# Patient Record
Sex: Female | Born: 1935 | Race: White | Hispanic: No | Smoking: Former smoker
Health system: Southern US, Community
[De-identification: ages and names within clinical notes are randomized; demographics above are authoritative.]

## PROBLEM LIST (undated history)

## (undated) DIAGNOSIS — M1990 Unspecified osteoarthritis, unspecified site: Secondary | ICD-10-CM

## (undated) DIAGNOSIS — T4145XA Adverse effect of unspecified anesthetic, initial encounter: Secondary | ICD-10-CM

## (undated) DIAGNOSIS — G40909 Epilepsy, unspecified, not intractable, without status epilepticus: Secondary | ICD-10-CM

## (undated) DIAGNOSIS — G4730 Sleep apnea, unspecified: Secondary | ICD-10-CM

## (undated) DIAGNOSIS — I639 Cerebral infarction, unspecified: Secondary | ICD-10-CM

## (undated) DIAGNOSIS — T8859XA Other complications of anesthesia, initial encounter: Secondary | ICD-10-CM

## (undated) DIAGNOSIS — R519 Headache, unspecified: Secondary | ICD-10-CM

## (undated) DIAGNOSIS — J189 Pneumonia, unspecified organism: Secondary | ICD-10-CM

## (undated) DIAGNOSIS — K219 Gastro-esophageal reflux disease without esophagitis: Secondary | ICD-10-CM

## (undated) DIAGNOSIS — R1310 Dysphagia, unspecified: Secondary | ICD-10-CM

## (undated) DIAGNOSIS — M6281 Muscle weakness (generalized): Secondary | ICD-10-CM

## (undated) DIAGNOSIS — N184 Chronic kidney disease, stage 4 (severe): Secondary | ICD-10-CM

## (undated) DIAGNOSIS — R51 Headache: Secondary | ICD-10-CM

## (undated) DIAGNOSIS — J9610 Chronic respiratory failure, unspecified whether with hypoxia or hypercapnia: Secondary | ICD-10-CM

## (undated) DIAGNOSIS — M549 Dorsalgia, unspecified: Secondary | ICD-10-CM

## (undated) DIAGNOSIS — D649 Anemia, unspecified: Secondary | ICD-10-CM

## (undated) DIAGNOSIS — I509 Heart failure, unspecified: Secondary | ICD-10-CM

## (undated) DIAGNOSIS — I10 Essential (primary) hypertension: Secondary | ICD-10-CM

## (undated) HISTORY — PX: DILATION AND CURETTAGE OF UTERUS: SHX78

## (undated) HISTORY — PX: JOINT REPLACEMENT: SHX530

## (undated) HISTORY — PX: BREAST SURGERY: SHX581

## (undated) HISTORY — PX: BACK SURGERY: SHX140

## (undated) HISTORY — PX: CAROTID ENDARTERECTOMY: SUR193

## (undated) HISTORY — PX: APPENDECTOMY: SHX54

## (undated) HISTORY — PX: KNEE SURGERY: SHX244

## (undated) HISTORY — PX: TONSILLECTOMY: SUR1361

---

## 2004-07-14 ENCOUNTER — Ambulatory Visit: Payer: Self-pay | Admitting: Internal Medicine

## 2004-10-23 ENCOUNTER — Inpatient Hospital Stay: Payer: Self-pay | Admitting: Anesthesiology

## 2004-12-16 ENCOUNTER — Encounter: Payer: Self-pay | Admitting: Internal Medicine

## 2005-01-01 ENCOUNTER — Encounter: Payer: Self-pay | Admitting: Internal Medicine

## 2005-01-31 ENCOUNTER — Encounter: Payer: Self-pay | Admitting: Internal Medicine

## 2006-12-03 ENCOUNTER — Emergency Department: Payer: Self-pay | Admitting: Emergency Medicine

## 2008-03-02 ENCOUNTER — Other Ambulatory Visit: Payer: Self-pay

## 2008-03-02 ENCOUNTER — Emergency Department: Payer: Self-pay | Admitting: Emergency Medicine

## 2009-05-28 ENCOUNTER — Ambulatory Visit: Payer: Self-pay | Admitting: Pain Medicine

## 2009-06-05 ENCOUNTER — Other Ambulatory Visit: Payer: Self-pay | Admitting: Pain Medicine

## 2009-06-19 ENCOUNTER — Ambulatory Visit: Payer: Self-pay | Admitting: Pain Medicine

## 2009-07-23 ENCOUNTER — Ambulatory Visit: Payer: Self-pay | Admitting: Pain Medicine

## 2009-07-31 ENCOUNTER — Ambulatory Visit: Payer: Self-pay | Admitting: Internal Medicine

## 2009-08-07 ENCOUNTER — Ambulatory Visit: Payer: Self-pay | Admitting: Pain Medicine

## 2009-09-16 ENCOUNTER — Ambulatory Visit: Payer: Self-pay | Admitting: Pain Medicine

## 2009-10-22 ENCOUNTER — Ambulatory Visit: Payer: Self-pay | Admitting: Pain Medicine

## 2009-10-30 ENCOUNTER — Ambulatory Visit: Payer: Self-pay | Admitting: Pain Medicine

## 2009-11-26 ENCOUNTER — Ambulatory Visit: Payer: Self-pay | Admitting: Pain Medicine

## 2009-12-04 ENCOUNTER — Ambulatory Visit: Payer: Self-pay | Admitting: Pain Medicine

## 2009-12-31 ENCOUNTER — Ambulatory Visit: Payer: Self-pay | Admitting: Pain Medicine

## 2009-12-31 ENCOUNTER — Ambulatory Visit: Payer: Self-pay | Admitting: Internal Medicine

## 2010-01-08 ENCOUNTER — Ambulatory Visit: Payer: Self-pay | Admitting: Pain Medicine

## 2010-01-28 ENCOUNTER — Ambulatory Visit: Payer: Self-pay | Admitting: Pain Medicine

## 2010-02-12 ENCOUNTER — Encounter: Payer: Self-pay | Admitting: Pain Medicine

## 2010-02-25 ENCOUNTER — Ambulatory Visit: Payer: Self-pay | Admitting: Pain Medicine

## 2010-03-03 ENCOUNTER — Encounter: Payer: Self-pay | Admitting: Pain Medicine

## 2010-03-05 ENCOUNTER — Ambulatory Visit: Payer: Self-pay | Admitting: Pain Medicine

## 2010-04-03 ENCOUNTER — Encounter: Payer: Self-pay | Admitting: Pain Medicine

## 2010-11-30 ENCOUNTER — Inpatient Hospital Stay: Payer: Self-pay | Admitting: Surgery

## 2010-12-02 LAB — PATHOLOGY REPORT

## 2011-02-25 ENCOUNTER — Inpatient Hospital Stay: Payer: Self-pay | Admitting: Internal Medicine

## 2011-03-05 ENCOUNTER — Emergency Department: Payer: Self-pay | Admitting: *Deleted

## 2012-07-17 ENCOUNTER — Emergency Department: Payer: Self-pay | Admitting: Emergency Medicine

## 2012-07-17 LAB — URINALYSIS, COMPLETE
Bacteria: NONE SEEN
Bilirubin,UR: NEGATIVE
Blood: NEGATIVE
Glucose,UR: 50 mg/dL (ref 0–75)
Hyaline Cast: 1
Ketone: NEGATIVE
Ph: 6 (ref 4.5–8.0)
Squamous Epithelial: <1
WBC UR: 12 /HPF (ref 0–5)

## 2012-07-17 LAB — CBC
HCT: 34.8 % — ABNORMAL LOW (ref 35.0–47.0)
HGB: 11.6 g/dL — ABNORMAL LOW (ref 12.0–16.0)
MCH: 32.2 pg (ref 26.0–34.0)
MCHC: 33.3 g/dL (ref 32.0–36.0)
MCV: 97 fL (ref 80–100)

## 2012-07-17 LAB — COMPREHENSIVE METABOLIC PANEL
Albumin: 3.5 g/dL (ref 3.4–5.0)
Alkaline Phosphatase: 71 U/L (ref 50–136)
Anion Gap: 11 (ref 7–16)
Calcium, Total: 11.6 mg/dL — ABNORMAL HIGH (ref 8.5–10.1)
Chloride: 100 mmol/L (ref 98–107)
EGFR (African American): 30 — ABNORMAL LOW
Glucose: 186 mg/dL — ABNORMAL HIGH (ref 65–99)
Potassium: 4.3 mmol/L (ref 3.5–5.1)
SGOT(AST): 31 U/L (ref 15–37)
SGPT (ALT): 30 U/L (ref 12–78)
Total Protein: 7.7 g/dL (ref 6.4–8.2)

## 2012-07-17 LAB — CK TOTAL AND CKMB (NOT AT ARMC)
CK, Total: 49 U/L (ref 21–215)
CK-MB: <0.5 ng/mL — ABNORMAL LOW (ref 0.5–3.6)

## 2012-07-18 LAB — CBC WITH DIFFERENTIAL/PLATELET
Basophil #: 0.1 10*3/uL (ref 0.0–0.1)
Basophil %: 0.5 %
Eosinophil #: 0.6 10*3/uL (ref 0.0–0.7)
HCT: 35.7 % (ref 35.0–47.0)
Lymphocyte #: 3.8 10*3/uL — ABNORMAL HIGH (ref 1.0–3.6)
MCH: 31.1 pg (ref 26.0–34.0)
MCHC: 32.8 g/dL (ref 32.0–36.0)
Monocyte #: 1.2 x10 3/mm — ABNORMAL HIGH (ref 0.2–0.9)
Monocyte %: 9.7 %
Neutrophil #: 7.1 10*3/uL — ABNORMAL HIGH (ref 1.4–6.5)
Platelet: 267 10*3/uL (ref 150–440)
RBC: 3.77 10*6/uL — ABNORMAL LOW (ref 3.80–5.20)
RDW: 11.9 % (ref 11.5–14.5)
WBC: 12.8 10*3/uL — ABNORMAL HIGH (ref 3.6–11.0)

## 2012-07-19 ENCOUNTER — Inpatient Hospital Stay: Payer: Self-pay | Admitting: Internal Medicine

## 2012-07-19 LAB — BASIC METABOLIC PANEL
BUN: 31 mg/dL — ABNORMAL HIGH (ref 7–18)
Calcium, Total: 11.3 mg/dL — ABNORMAL HIGH (ref 8.5–10.1)
Creatinine: 1.78 mg/dL — ABNORMAL HIGH (ref 0.60–1.30)
EGFR (African American): 29 — ABNORMAL LOW
EGFR (Non-African Amer.): 25 — ABNORMAL LOW
Glucose: 130 mg/dL — ABNORMAL HIGH (ref 65–99)
Osmolality: 269 (ref 275–301)
Sodium: 130 mmol/L — ABNORMAL LOW (ref 136–145)

## 2012-07-19 LAB — URINALYSIS, COMPLETE
Nitrite: NEGATIVE
Ph: 6 (ref 4.5–8.0)
Protein: 30
RBC,UR: <1 /HPF (ref 0–5)
WBC UR: 4 /HPF (ref 0–5)

## 2012-07-20 LAB — CBC WITH DIFFERENTIAL/PLATELET
Basophil #: 0.1 10*3/uL (ref 0.0–0.1)
Basophil %: 0.7 %
HCT: 35.3 % (ref 35.0–47.0)
HGB: 12.2 g/dL (ref 12.0–16.0)
Lymphocyte #: 3 10*3/uL (ref 1.0–3.6)
Lymphocyte %: 31.7 %
MCHC: 34.6 g/dL (ref 32.0–36.0)
MCV: 95 fL (ref 80–100)
Monocyte #: 0.9 x10 3/mm (ref 0.2–0.9)
Monocyte %: 9.5 %
Neutrophil #: 4.9 10*3/uL (ref 1.4–6.5)
RDW: 11.6 % (ref 11.5–14.5)
WBC: 9.5 10*3/uL (ref 3.6–11.0)

## 2012-07-20 LAB — COMPREHENSIVE METABOLIC PANEL
Alkaline Phosphatase: 75 U/L (ref 50–136)
Anion Gap: 8 (ref 7–16)
Bilirubin,Total: 0.3 mg/dL (ref 0.2–1.0)
Calcium, Total: 11 mg/dL — ABNORMAL HIGH (ref 8.5–10.1)
Chloride: 102 mmol/L (ref 98–107)
Co2: 24 mmol/L (ref 21–32)
Creatinine: 1.54 mg/dL — ABNORMAL HIGH (ref 0.60–1.30)
EGFR (Non-African Amer.): 30 — ABNORMAL LOW
Osmolality: 274 (ref 275–301)
Potassium: 4 mmol/L (ref 3.5–5.1)
SGPT (ALT): 27 U/L (ref 12–78)
Sodium: 134 mmol/L — ABNORMAL LOW (ref 136–145)

## 2012-07-21 LAB — BASIC METABOLIC PANEL
Calcium, Total: 10.4 mg/dL — ABNORMAL HIGH (ref 8.5–10.1)
Co2: 22 mmol/L (ref 21–32)
Creatinine: 1.47 mg/dL — ABNORMAL HIGH (ref 0.60–1.30)
EGFR (African American): 37 — ABNORMAL LOW
EGFR (Non-African Amer.): 32 — ABNORMAL LOW
Glucose: 125 mg/dL — ABNORMAL HIGH (ref 65–99)
Sodium: 135 mmol/L — ABNORMAL LOW (ref 136–145)

## 2012-07-22 LAB — BASIC METABOLIC PANEL
Anion Gap: 7 (ref 7–16)
Calcium, Total: 10.2 mg/dL — ABNORMAL HIGH (ref 8.5–10.1)
Chloride: 103 mmol/L (ref 98–107)
Co2: 24 mmol/L (ref 21–32)
EGFR (African American): 35 — ABNORMAL LOW
Osmolality: 274 (ref 275–301)

## 2013-05-01 ENCOUNTER — Ambulatory Visit: Payer: Self-pay | Admitting: Internal Medicine

## 2013-05-01 LAB — IRON AND TIBC
Iron Saturation: 11 %
Iron: 39 ug/dL — ABNORMAL LOW (ref 50–170)
Unbound Iron-Bind.Cap.: 327 ug/dL

## 2013-05-01 LAB — RETICULOCYTES
Absolute Retic Count: 0.0422 10*6/uL (ref 0.019–0.186)
Reticulocyte: 1.8 % (ref 0.4–3.1)

## 2013-05-01 LAB — CBC CANCER CENTER
Eosinophil: 8 %
MCHC: 33.8 g/dL (ref 32.0–36.0)
MCV: 94 fL (ref 80–100)
Segmented Neutrophils: 58 %

## 2013-05-03 ENCOUNTER — Ambulatory Visit: Payer: Self-pay | Admitting: Internal Medicine

## 2013-05-05 LAB — CBC CANCER CENTER
Basophil #: 0.1 x10 3/mm (ref 0.0–0.1)
Basophil %: 1 %
Eosinophil %: 8.7 %
HCT: 23.6 % — ABNORMAL LOW (ref 35.0–47.0)
Lymphocyte #: 2.8 x10 3/mm (ref 1.0–3.6)
MCHC: 34.3 g/dL (ref 32.0–36.0)
MCV: 94 fL (ref 80–100)
Monocyte #: 0.8 x10 3/mm (ref 0.2–0.9)
Monocyte %: 9.3 %
Neutrophil #: 3.8 x10 3/mm (ref 1.4–6.5)
Platelet: 223 x10 3/mm (ref 150–440)
RBC: 2.53 10*6/uL — ABNORMAL LOW (ref 3.80–5.20)
RDW: 12.8 % (ref 11.5–14.5)
WBC: 8.2 x10 3/mm (ref 3.6–11.0)

## 2013-05-05 LAB — RETICULOCYTES: Absolute Retic Count: 0.0333 10*6/uL (ref 0.019–0.186)

## 2013-05-27 ENCOUNTER — Emergency Department: Payer: Self-pay | Admitting: Emergency Medicine

## 2013-06-03 ENCOUNTER — Ambulatory Visit: Payer: Self-pay | Admitting: Internal Medicine

## 2013-06-07 LAB — CANCER CENTER HEMOGLOBIN: HGB: 9.7 g/dL — ABNORMAL LOW (ref 12.0–16.0)

## 2013-07-03 ENCOUNTER — Ambulatory Visit: Payer: Self-pay | Admitting: Internal Medicine

## 2013-07-12 LAB — CBC CANCER CENTER
Basophil #: 0.1 x10 3/mm (ref 0.0–0.1)
Basophil %: 1 %
Eosinophil #: 0.6 x10 3/mm (ref 0.0–0.7)
Eosinophil %: 7.3 %
HCT: 26.2 % — ABNORMAL LOW (ref 35.0–47.0)
Lymphocyte #: 2.2 x10 3/mm (ref 1.0–3.6)
Lymphocyte %: 28.2 %
MCV: 98 fL (ref 80–100)
Monocyte #: 0.9 x10 3/mm (ref 0.2–0.9)
Monocyte %: 10.7 %
Neutrophil %: 52.8 %
Platelet: 250 x10 3/mm (ref 150–440)
RBC: 2.69 10*6/uL — ABNORMAL LOW (ref 3.80–5.20)
RDW: 15.1 % — ABNORMAL HIGH (ref 11.5–14.5)

## 2013-07-12 LAB — IRON AND TIBC
Iron Bind.Cap.(Total): 273 ug/dL (ref 250–450)
Iron Saturation: 41 %
Iron: 111 ug/dL (ref 50–170)
Unbound Iron-Bind.Cap.: 162 ug/dL

## 2013-07-12 LAB — FERRITIN: Ferritin (ARMC): 35 ng/mL (ref 8–388)

## 2013-07-14 LAB — CBC WITH DIFFERENTIAL/PLATELET
Basophil #: 0.1 10*3/uL (ref 0.0–0.1)
Basophil %: 1 %
Eosinophil #: 0.9 10*3/uL — ABNORMAL HIGH (ref 0.0–0.7)
Eosinophil %: 8.7 %
HCT: 26.9 % — ABNORMAL LOW (ref 35.0–47.0)
HGB: 9 g/dL — ABNORMAL LOW (ref 12.0–16.0)
Lymphocyte %: 34.3 %
MCH: 32.7 pg (ref 26.0–34.0)
MCV: 97 fL (ref 80–100)
Neutrophil #: 4.7 10*3/uL (ref 1.4–6.5)
Neutrophil %: 44.9 %
Platelet: 263 10*3/uL (ref 150–440)
RDW: 15 % — ABNORMAL HIGH (ref 11.5–14.5)
WBC: 10.5 10*3/uL (ref 3.6–11.0)

## 2013-07-14 LAB — CK TOTAL AND CKMB (NOT AT ARMC)
CK, Total: 38 U/L (ref 21–215)
CK-MB: <0.5 ng/mL — ABNORMAL LOW (ref 0.5–3.6)

## 2013-07-14 LAB — URINALYSIS, COMPLETE
Blood: NEGATIVE
Hyaline Cast: 3
Ketone: NEGATIVE
Leukocyte Esterase: NEGATIVE
Nitrite: NEGATIVE
Ph: 6 (ref 4.5–8.0)
Protein: 30
RBC,UR: 1 /HPF (ref 0–5)
Specific Gravity: 1.009 (ref 1.003–1.030)
Squamous Epithelial: <1

## 2013-07-14 LAB — COMPREHENSIVE METABOLIC PANEL
Alkaline Phosphatase: 57 U/L
BUN: 28 mg/dL — ABNORMAL HIGH (ref 7–18)
Bilirubin,Total: 0.2 mg/dL (ref 0.2–1.0)
Co2: 25 mmol/L (ref 21–32)
Creatinine: 2.27 mg/dL — ABNORMAL HIGH (ref 0.60–1.30)
Glucose: 142 mg/dL — ABNORMAL HIGH (ref 65–99)
Osmolality: 280 (ref 275–301)
Potassium: 4.3 mmol/L (ref 3.5–5.1)
SGOT(AST): 21 U/L (ref 15–37)
SGPT (ALT): 16 U/L (ref 12–78)

## 2013-07-14 LAB — PROTIME-INR
INR: 0.9
Prothrombin Time: 12.5 secs (ref 11.5–14.7)

## 2013-07-15 DIAGNOSIS — I6350 Cerebral infarction due to unspecified occlusion or stenosis of unspecified cerebral artery: Secondary | ICD-10-CM

## 2013-07-15 LAB — CK TOTAL AND CKMB (NOT AT ARMC)
CK, Total: 40 U/L (ref 21–215)
CK, Total: 30 U/L (ref 21–215)
CK-MB: <0.5 ng/mL — ABNORMAL LOW (ref 0.5–3.6)

## 2013-07-15 LAB — TROPONIN I
Troponin-I: <0.02 ng/mL
Troponin-I: <0.02 ng/mL

## 2013-07-16 ENCOUNTER — Inpatient Hospital Stay: Payer: Self-pay | Admitting: Internal Medicine

## 2013-07-16 LAB — BASIC METABOLIC PANEL
BUN: 23 mg/dL — ABNORMAL HIGH (ref 7–18)
Calcium, Total: 10.8 mg/dL — ABNORMAL HIGH (ref 8.5–10.1)
Chloride: 107 mmol/L (ref 98–107)
Co2: 26 mmol/L (ref 21–32)
EGFR (African American): 22 — ABNORMAL LOW
EGFR (Non-African Amer.): 19 — ABNORMAL LOW
Potassium: 4 mmol/L (ref 3.5–5.1)

## 2013-07-16 LAB — CBC WITH DIFFERENTIAL/PLATELET
Basophil #: 0.1 10*3/uL (ref 0.0–0.1)
Eosinophil #: 0.6 10*3/uL (ref 0.0–0.7)
HCT: 26.2 % — ABNORMAL LOW (ref 35.0–47.0)
Lymphocyte #: 1.8 10*3/uL (ref 1.0–3.6)
Lymphocyte %: 20.8 %
MCHC: 33.5 g/dL (ref 32.0–36.0)
Monocyte #: 0.9 x10 3/mm (ref 0.2–0.9)
Monocyte %: 10.1 %
Neutrophil %: 60.9 %
Platelet: 267 10*3/uL (ref 150–440)
RBC: 2.69 10*6/uL — ABNORMAL LOW (ref 3.80–5.20)
RDW: 15 % — ABNORMAL HIGH (ref 11.5–14.5)
WBC: 8.5 10*3/uL (ref 3.6–11.0)

## 2013-07-16 LAB — LIPID PANEL
HDL Cholesterol: 42 mg/dL (ref 40–60)
Ldl Cholesterol, Calc: 112 mg/dL — ABNORMAL HIGH (ref 0–100)
Triglycerides: 259 mg/dL — ABNORMAL HIGH (ref 0–200)

## 2013-07-16 LAB — HEMOGLOBIN A1C: Hemoglobin A1C: 5.3 % (ref 4.2–6.3)

## 2013-07-17 LAB — BASIC METABOLIC PANEL
Anion Gap: 6 — ABNORMAL LOW (ref 7–16)
Chloride: 106 mmol/L (ref 98–107)
Co2: 25 mmol/L (ref 21–32)
Creatinine: 2.15 mg/dL — ABNORMAL HIGH (ref 0.60–1.30)
EGFR (African American): 23 — ABNORMAL LOW
EGFR (Non-African Amer.): 20 — ABNORMAL LOW
Glucose: 159 mg/dL — ABNORMAL HIGH (ref 65–99)
Osmolality: 281 (ref 275–301)
Potassium: 4 mmol/L (ref 3.5–5.1)
Sodium: 137 mmol/L (ref 136–145)

## 2013-07-19 LAB — BASIC METABOLIC PANEL
Anion Gap: 6 — ABNORMAL LOW (ref 7–16)
BUN: 26 mg/dL — ABNORMAL HIGH (ref 7–18)
Calcium, Total: 11.1 mg/dL — ABNORMAL HIGH (ref 8.5–10.1)
Chloride: 104 mmol/L (ref 98–107)
Co2: 28 mmol/L (ref 21–32)
Creatinine: 2.18 mg/dL — ABNORMAL HIGH (ref 0.60–1.30)
Glucose: 110 mg/dL — ABNORMAL HIGH (ref 65–99)
Osmolality: 281 (ref 275–301)
Potassium: 4 mmol/L (ref 3.5–5.1)

## 2013-07-20 LAB — CULTURE, BLOOD (SINGLE)

## 2013-08-03 ENCOUNTER — Ambulatory Visit: Payer: Self-pay | Admitting: Internal Medicine

## 2013-08-14 ENCOUNTER — Inpatient Hospital Stay: Payer: Self-pay | Admitting: Internal Medicine

## 2013-08-14 LAB — CBC
HCT: 23.6 % — AB (ref 35.0–47.0)
HGB: 8.1 g/dL — AB (ref 12.0–16.0)
MCH: 32.9 pg (ref 26.0–34.0)
MCHC: 34.2 g/dL (ref 32.0–36.0)
MCV: 96 fL (ref 80–100)
PLATELETS: 298 10*3/uL (ref 150–440)
RBC: 2.46 10*6/uL — AB (ref 3.80–5.20)
RDW: 14 % (ref 11.5–14.5)
WBC: 9.2 10*3/uL (ref 3.6–11.0)

## 2013-08-14 LAB — BASIC METABOLIC PANEL
Anion Gap: 8 (ref 7–16)
BUN: 39 mg/dL — ABNORMAL HIGH (ref 7–18)
CREATININE: 2.31 mg/dL — AB (ref 0.60–1.30)
Calcium, Total: 10.4 mg/dL — ABNORMAL HIGH (ref 8.5–10.1)
Chloride: 100 mmol/L (ref 98–107)
Co2: 24 mmol/L (ref 21–32)
EGFR (African American): 21 — ABNORMAL LOW
EGFR (Non-African Amer.): 18 — ABNORMAL LOW
GLUCOSE: 120 mg/dL — AB (ref 65–99)
OSMOLALITY: 275 (ref 275–301)
Potassium: 4.4 mmol/L (ref 3.5–5.1)
Sodium: 132 mmol/L — ABNORMAL LOW (ref 136–145)

## 2013-08-14 LAB — PRO B NATRIURETIC PEPTIDE: B-Type Natriuretic Peptide: 474 pg/mL — ABNORMAL HIGH (ref 0–450)

## 2013-08-14 LAB — URINALYSIS, COMPLETE
Bacteria: NONE SEEN
Bilirubin,UR: NEGATIVE
Blood: NEGATIVE
Glucose,UR: NEGATIVE mg/dL (ref 0–75)
Ketone: NEGATIVE
Leukocyte Esterase: NEGATIVE
Nitrite: NEGATIVE
Ph: 5 (ref 4.5–8.0)
SPECIFIC GRAVITY: 1.009 (ref 1.003–1.030)
SQUAMOUS EPITHELIAL: NONE SEEN
WBC UR: <1 /HPF (ref 0–5)

## 2013-08-14 LAB — TROPONIN I: Troponin-I: <0.02 ng/mL

## 2013-08-14 LAB — PHENYTOIN LEVEL, TOTAL: Dilantin: 6.4 ug/mL — ABNORMAL LOW (ref 10.0–20.0)

## 2013-08-15 LAB — CBC WITH DIFFERENTIAL/PLATELET
Basophil #: 0.1 10*3/uL (ref 0.0–0.1)
Basophil %: 0.7 %
EOS PCT: 6.4 %
Eosinophil #: 0.7 10*3/uL (ref 0.0–0.7)
HCT: 25.2 % — AB (ref 35.0–47.0)
HGB: 8.6 g/dL — ABNORMAL LOW (ref 12.0–16.0)
Lymphocyte #: 2.3 10*3/uL (ref 1.0–3.6)
Lymphocyte %: 21 %
MCH: 33 pg (ref 26.0–34.0)
MCHC: 34 g/dL (ref 32.0–36.0)
MCV: 97 fL (ref 80–100)
Monocyte #: 1.1 x10 3/mm — ABNORMAL HIGH (ref 0.2–0.9)
Monocyte %: 9.7 %
Neutrophil #: 6.8 10*3/uL — ABNORMAL HIGH (ref 1.4–6.5)
Neutrophil %: 62.2 %
Platelet: 328 10*3/uL (ref 150–440)
RBC: 2.6 10*6/uL — ABNORMAL LOW (ref 3.80–5.20)
RDW: 13.6 % (ref 11.5–14.5)
WBC: 11 10*3/uL (ref 3.6–11.0)

## 2013-08-15 LAB — BASIC METABOLIC PANEL
ANION GAP: 6 — AB (ref 7–16)
BUN: 35 mg/dL — ABNORMAL HIGH (ref 7–18)
CALCIUM: 10.7 mg/dL — AB (ref 8.5–10.1)
Chloride: 101 mmol/L (ref 98–107)
Co2: 25 mmol/L (ref 21–32)
Creatinine: 2.19 mg/dL — ABNORMAL HIGH (ref 0.60–1.30)
EGFR (Non-African Amer.): 19 — ABNORMAL LOW
GFR CALC AF AMER: 23 — AB
Glucose: 98 mg/dL (ref 65–99)
Osmolality: 272 (ref 275–301)
POTASSIUM: 4.2 mmol/L (ref 3.5–5.1)
SODIUM: 132 mmol/L — AB (ref 136–145)

## 2013-08-15 LAB — TSH: THYROID STIMULATING HORM: 2.28 u[IU]/mL

## 2013-08-16 LAB — BASIC METABOLIC PANEL
ANION GAP: 3 — AB (ref 7–16)
BUN: 34 mg/dL — AB (ref 7–18)
CALCIUM: 10.3 mg/dL — AB (ref 8.5–10.1)
CHLORIDE: 99 mmol/L (ref 98–107)
CO2: 28 mmol/L (ref 21–32)
CREATININE: 2.11 mg/dL — AB (ref 0.60–1.30)
EGFR (African American): 24 — ABNORMAL LOW
EGFR (Non-African Amer.): 20 — ABNORMAL LOW
Glucose: 124 mg/dL — ABNORMAL HIGH (ref 65–99)
Osmolality: 270 (ref 275–301)
POTASSIUM: 4.3 mmol/L (ref 3.5–5.1)
SODIUM: 130 mmol/L — AB (ref 136–145)

## 2013-08-23 ENCOUNTER — Ambulatory Visit: Payer: Self-pay | Admitting: Internal Medicine

## 2013-08-23 LAB — CBC CANCER CENTER
Basophil #: 0.1 x10 3/mm (ref 0.0–0.1)
Basophil %: 0.6 %
EOS PCT: 2 %
Eosinophil #: 0.2 x10 3/mm (ref 0.0–0.7)
HCT: 24.4 % — ABNORMAL LOW (ref 35.0–47.0)
HGB: 8.1 g/dL — AB (ref 12.0–16.0)
LYMPHS ABS: 2.1 x10 3/mm (ref 1.0–3.6)
Lymphocyte %: 19.4 %
MCH: 33.1 pg (ref 26.0–34.0)
MCHC: 33.2 g/dL (ref 32.0–36.0)
MCV: 100 fL (ref 80–100)
MONOS PCT: 8.4 %
Monocyte #: 0.9 x10 3/mm (ref 0.2–0.9)
Neutrophil #: 7.6 x10 3/mm — ABNORMAL HIGH (ref 1.4–6.5)
Neutrophil %: 69.6 %
PLATELETS: 328 x10 3/mm (ref 150–440)
RBC: 2.45 10*6/uL — ABNORMAL LOW (ref 3.80–5.20)
RDW: 14.5 % (ref 11.5–14.5)
WBC: 11 x10 3/mm (ref 3.6–11.0)

## 2013-08-23 LAB — IRON AND TIBC
Iron Bind.Cap.(Total): 286 ug/dL (ref 250–450)
Iron Saturation: 42 %
Iron: 119 ug/dL (ref 50–170)
Unbound Iron-Bind.Cap.: 167 ug/dL

## 2013-08-23 LAB — FERRITIN: FERRITIN (ARMC): 30 ng/mL (ref 8–388)

## 2013-08-30 LAB — CANCER CENTER HEMOGLOBIN: HGB: 7.4 g/dL — ABNORMAL LOW (ref 12.0–16.0)

## 2013-09-03 ENCOUNTER — Ambulatory Visit: Payer: Self-pay | Admitting: Internal Medicine

## 2013-09-07 ENCOUNTER — Ambulatory Visit: Payer: Self-pay | Admitting: Vascular Surgery

## 2013-09-07 LAB — URINALYSIS, COMPLETE
BILIRUBIN, UR: NEGATIVE
BLOOD: NEGATIVE
Glucose,UR: NEGATIVE mg/dL (ref 0–75)
Ketone: NEGATIVE
Nitrite: NEGATIVE
PH: 5 (ref 4.5–8.0)
Protein: NEGATIVE
RBC,UR: 1 /HPF (ref 0–5)
SPECIFIC GRAVITY: 1.009 (ref 1.003–1.030)

## 2013-09-07 LAB — BASIC METABOLIC PANEL
Anion Gap: 6 — ABNORMAL LOW (ref 7–16)
BUN: 42 mg/dL — ABNORMAL HIGH (ref 7–18)
CHLORIDE: 104 mmol/L (ref 98–107)
Calcium, Total: 10.9 mg/dL — ABNORMAL HIGH (ref 8.5–10.1)
Co2: 23 mmol/L (ref 21–32)
Creatinine: 2.42 mg/dL — ABNORMAL HIGH (ref 0.60–1.30)
EGFR (African American): 20 — ABNORMAL LOW
EGFR (Non-African Amer.): 17 — ABNORMAL LOW
GLUCOSE: 116 mg/dL — AB (ref 65–99)
OSMOLALITY: 278 (ref 275–301)
Potassium: 5.5 mmol/L — ABNORMAL HIGH (ref 3.5–5.1)
Sodium: 133 mmol/L — ABNORMAL LOW (ref 136–145)

## 2013-09-07 LAB — CBC
HCT: 22.1 % — AB (ref 35.0–47.0)
HGB: 7.5 g/dL — ABNORMAL LOW (ref 12.0–16.0)
MCH: 34 pg (ref 26.0–34.0)
MCHC: 34.1 g/dL (ref 32.0–36.0)
MCV: 100 fL (ref 80–100)
Platelet: 219 10*3/uL (ref 150–440)
RBC: 2.21 10*6/uL — AB (ref 3.80–5.20)
RDW: 13.8 % (ref 11.5–14.5)
WBC: 9.2 10*3/uL (ref 3.6–11.0)

## 2013-09-15 ENCOUNTER — Inpatient Hospital Stay: Payer: Self-pay | Admitting: Vascular Surgery

## 2013-09-15 LAB — HEMATOCRIT: HCT: 28 % — ABNORMAL LOW (ref 35.0–47.0)

## 2013-09-15 LAB — HEMOGLOBIN: HGB: 9.8 g/dL — ABNORMAL LOW (ref 12.0–16.0)

## 2013-09-15 LAB — POTASSIUM: POTASSIUM: 3.6 mmol/L (ref 3.5–5.1)

## 2013-09-16 LAB — BASIC METABOLIC PANEL
Anion Gap: 6 — ABNORMAL LOW (ref 7–16)
BUN: 26 mg/dL — ABNORMAL HIGH (ref 7–18)
CO2: 21 mmol/L (ref 21–32)
Calcium, Total: 9.8 mg/dL (ref 8.5–10.1)
Chloride: 111 mmol/L — ABNORMAL HIGH (ref 98–107)
Creatinine: 1.89 mg/dL — ABNORMAL HIGH (ref 0.60–1.30)
EGFR (African American): 27 — ABNORMAL LOW
GFR CALC NON AF AMER: 23 — AB
GLUCOSE: 102 mg/dL — AB (ref 65–99)
OSMOLALITY: 281 (ref 275–301)
Potassium: 3.8 mmol/L (ref 3.5–5.1)
SODIUM: 138 mmol/L (ref 136–145)

## 2013-09-16 LAB — CBC WITH DIFFERENTIAL/PLATELET
BASOS PCT: 1 %
Basophil #: 0.1 10*3/uL (ref 0.0–0.1)
EOS PCT: 0 %
Eosinophil #: 0 10*3/uL (ref 0.0–0.7)
HCT: 27 % — AB (ref 35.0–47.0)
HGB: 8.9 g/dL — AB (ref 12.0–16.0)
LYMPHS ABS: 1.5 10*3/uL (ref 1.0–3.6)
Lymphocyte %: 14 %
MCH: 32.1 pg (ref 26.0–34.0)
MCHC: 33 g/dL (ref 32.0–36.0)
MCV: 97 fL (ref 80–100)
Monocyte #: 1.4 x10 3/mm — ABNORMAL HIGH (ref 0.2–0.9)
Monocyte %: 12.7 %
Neutrophil #: 7.8 10*3/uL — ABNORMAL HIGH (ref 1.4–6.5)
Neutrophil %: 72.3 %
Platelet: 292 10*3/uL (ref 150–440)
RBC: 2.78 10*6/uL — ABNORMAL LOW (ref 3.80–5.20)
RDW: 16.2 % — ABNORMAL HIGH (ref 11.5–14.5)
WBC: 10.8 10*3/uL (ref 3.6–11.0)

## 2013-09-16 LAB — PROTIME-INR
INR: 1.1
PROTHROMBIN TIME: 14.1 s (ref 11.5–14.7)

## 2013-09-16 LAB — APTT: Activated PTT: 24.6 secs (ref 23.6–35.9)

## 2013-09-16 LAB — PHENYTOIN LEVEL, TOTAL: Dilantin: 5.7 ug/mL — ABNORMAL LOW (ref 10.0–20.0)

## 2013-09-18 LAB — CBC WITH DIFFERENTIAL/PLATELET
BASOS ABS: 0.1 10*3/uL (ref 0.0–0.1)
Basophil %: 0.7 %
EOS ABS: 0.3 10*3/uL (ref 0.0–0.7)
Eosinophil %: 3.3 %
HCT: 24.1 % — ABNORMAL LOW (ref 35.0–47.0)
HGB: 8.3 g/dL — ABNORMAL LOW (ref 12.0–16.0)
Lymphocyte #: 1.4 10*3/uL (ref 1.0–3.6)
Lymphocyte %: 15.6 %
MCH: 33.7 pg (ref 26.0–34.0)
MCHC: 34.5 g/dL (ref 32.0–36.0)
MCV: 98 fL (ref 80–100)
Monocyte #: 1.1 x10 3/mm — ABNORMAL HIGH (ref 0.2–0.9)
Monocyte %: 12.3 %
Neutrophil #: 6.3 10*3/uL (ref 1.4–6.5)
Neutrophil %: 68.1 %
PLATELETS: 256 10*3/uL (ref 150–440)
RBC: 2.46 10*6/uL — ABNORMAL LOW (ref 3.80–5.20)
RDW: 15.6 % — ABNORMAL HIGH (ref 11.5–14.5)
WBC: 9.2 10*3/uL (ref 3.6–11.0)

## 2013-09-18 LAB — BASIC METABOLIC PANEL
ANION GAP: 5 — AB (ref 7–16)
BUN: 25 mg/dL — ABNORMAL HIGH (ref 7–18)
CALCIUM: 9.6 mg/dL (ref 8.5–10.1)
Chloride: 112 mmol/L — ABNORMAL HIGH (ref 98–107)
Co2: 25 mmol/L (ref 21–32)
Creatinine: 1.6 mg/dL — ABNORMAL HIGH (ref 0.60–1.30)
GFR CALC AF AMER: 33 — AB
GFR CALC NON AF AMER: 28 — AB
GLUCOSE: 96 mg/dL (ref 65–99)
OSMOLALITY: 287 (ref 275–301)
Potassium: 3.5 mmol/L (ref 3.5–5.1)
Sodium: 142 mmol/L (ref 136–145)

## 2013-09-18 LAB — PATHOLOGY REPORT

## 2013-09-21 ENCOUNTER — Ambulatory Visit: Payer: Self-pay | Admitting: Internal Medicine

## 2013-10-04 ENCOUNTER — Emergency Department: Payer: Self-pay

## 2013-10-04 ENCOUNTER — Ambulatory Visit: Payer: Self-pay | Admitting: Internal Medicine

## 2013-10-04 LAB — CBC
HCT: 29.3 % — AB (ref 35.0–47.0)
HGB: 9.6 g/dL — ABNORMAL LOW (ref 12.0–16.0)
MCH: 31.9 pg (ref 26.0–34.0)
MCHC: 32.8 g/dL (ref 32.0–36.0)
MCV: 97 fL (ref 80–100)
PLATELETS: 277 10*3/uL (ref 150–440)
RBC: 3.02 10*6/uL — AB (ref 3.80–5.20)
RDW: 14.9 % — AB (ref 11.5–14.5)
WBC: 7.4 10*3/uL (ref 3.6–11.0)

## 2013-10-04 LAB — BASIC METABOLIC PANEL
Anion Gap: 6 — ABNORMAL LOW (ref 7–16)
BUN: 24 mg/dL — AB (ref 7–18)
CHLORIDE: 103 mmol/L (ref 98–107)
CREATININE: 2.13 mg/dL — AB (ref 0.60–1.30)
Calcium, Total: 10.6 mg/dL — ABNORMAL HIGH (ref 8.5–10.1)
Co2: 25 mmol/L (ref 21–32)
EGFR (African American): 23 — ABNORMAL LOW
EGFR (Non-African Amer.): 20 — ABNORMAL LOW
GLUCOSE: 141 mg/dL — AB (ref 65–99)
OSMOLALITY: 275 (ref 275–301)
POTASSIUM: 4.2 mmol/L (ref 3.5–5.1)
SODIUM: 134 mmol/L — AB (ref 136–145)

## 2013-10-05 ENCOUNTER — Ambulatory Visit: Payer: Self-pay | Admitting: Internal Medicine

## 2013-10-29 ENCOUNTER — Emergency Department: Payer: Self-pay | Admitting: Emergency Medicine

## 2013-10-29 LAB — CBC
HCT: 27.6 % — AB (ref 35.0–47.0)
HGB: 9.2 g/dL — ABNORMAL LOW (ref 12.0–16.0)
MCH: 34.1 pg — ABNORMAL HIGH (ref 26.0–34.0)
MCHC: 33.3 g/dL (ref 32.0–36.0)
MCV: 102 fL — ABNORMAL HIGH (ref 80–100)
Platelet: 291 10*3/uL (ref 150–440)
RBC: 2.7 10*6/uL — ABNORMAL LOW (ref 3.80–5.20)
RDW: 15.7 % — ABNORMAL HIGH (ref 11.5–14.5)
WBC: 7.7 10*3/uL (ref 3.6–11.0)

## 2013-10-29 LAB — TROPONIN I: Troponin-I: <0.02 ng/mL

## 2013-10-29 LAB — COMPREHENSIVE METABOLIC PANEL
ALBUMIN: 3.1 g/dL — AB (ref 3.4–5.0)
Alkaline Phosphatase: 54 U/L
Anion Gap: 7 (ref 7–16)
BILIRUBIN TOTAL: 0.1 mg/dL — AB (ref 0.2–1.0)
BUN: 36 mg/dL — ABNORMAL HIGH (ref 7–18)
CALCIUM: 10 mg/dL (ref 8.5–10.1)
CHLORIDE: 95 mmol/L — AB (ref 98–107)
CO2: 29 mmol/L (ref 21–32)
CREATININE: 2.27 mg/dL — AB (ref 0.60–1.30)
EGFR (African American): 22 — ABNORMAL LOW
EGFR (Non-African Amer.): 19 — ABNORMAL LOW
Glucose: 143 mg/dL — ABNORMAL HIGH (ref 65–99)
OSMOLALITY: 273 (ref 275–301)
POTASSIUM: 4.5 mmol/L (ref 3.5–5.1)
SGOT(AST): 22 U/L (ref 15–37)
SGPT (ALT): 16 U/L (ref 12–78)
Sodium: 131 mmol/L — ABNORMAL LOW (ref 136–145)
Total Protein: 6.9 g/dL (ref 6.4–8.2)

## 2013-10-29 LAB — LIPASE, BLOOD: Lipase: 161 U/L (ref 73–393)

## 2013-11-17 ENCOUNTER — Ambulatory Visit: Payer: Self-pay | Admitting: Internal Medicine

## 2013-11-20 ENCOUNTER — Ambulatory Visit: Payer: Self-pay | Admitting: Internal Medicine

## 2013-11-20 LAB — CANCER CENTER HEMOGLOBIN: HGB: 10.7 g/dL — AB (ref 12.0–16.0)

## 2013-11-23 LAB — COMPREHENSIVE METABOLIC PANEL
ALBUMIN: 3.3 g/dL — AB (ref 3.4–5.0)
Alkaline Phosphatase: 71 U/L
Anion Gap: 6 — ABNORMAL LOW (ref 7–16)
BUN: 31 mg/dL — ABNORMAL HIGH (ref 7–18)
Bilirubin,Total: 0.2 mg/dL (ref 0.2–1.0)
CALCIUM: 11.9 mg/dL — AB (ref 8.5–10.1)
CO2: 27 mmol/L (ref 21–32)
Chloride: 101 mmol/L (ref 98–107)
Creatinine: 2.31 mg/dL — ABNORMAL HIGH (ref 0.60–1.30)
EGFR (Non-African Amer.): 18 — ABNORMAL LOW
GFR CALC AF AMER: 21 — AB
Glucose: 108 mg/dL — ABNORMAL HIGH (ref 65–99)
Osmolality: 275 (ref 275–301)
Potassium: 4.8 mmol/L (ref 3.5–5.1)
SGOT(AST): 18 U/L (ref 15–37)
SGPT (ALT): 13 U/L (ref 12–78)
SODIUM: 134 mmol/L — AB (ref 136–145)
TOTAL PROTEIN: 7.4 g/dL (ref 6.4–8.2)

## 2013-11-23 LAB — TSH: Thyroid Stimulating Horm: 1.56 u[IU]/mL

## 2013-11-23 LAB — URINALYSIS, COMPLETE
BILIRUBIN, UR: NEGATIVE
Blood: NEGATIVE
GLUCOSE, UR: NEGATIVE mg/dL (ref 0–75)
Ketone: NEGATIVE
Leukocyte Esterase: NEGATIVE
Nitrite: NEGATIVE
Ph: 5 (ref 4.5–8.0)
Protein: 30
RBC,UR: NONE SEEN /HPF (ref 0–5)
SPECIFIC GRAVITY: 1.011 (ref 1.003–1.030)
Squamous Epithelial: NONE SEEN
WBC UR: 1 /HPF (ref 0–5)

## 2013-11-23 LAB — CBC
HCT: 34.9 % — ABNORMAL LOW (ref 35.0–47.0)
HGB: 11.6 g/dL — ABNORMAL LOW (ref 12.0–16.0)
MCH: 34.2 pg — ABNORMAL HIGH (ref 26.0–34.0)
MCHC: 33.3 g/dL (ref 32.0–36.0)
MCV: 103 fL — ABNORMAL HIGH (ref 80–100)
Platelet: 310 10*3/uL (ref 150–440)
RBC: 3.4 10*6/uL — ABNORMAL LOW (ref 3.80–5.20)
RDW: 13.4 % (ref 11.5–14.5)
WBC: 12.7 10*3/uL — ABNORMAL HIGH (ref 3.6–11.0)

## 2013-11-23 LAB — TROPONIN I: Troponin-I: <0.02 ng/mL

## 2013-11-24 ENCOUNTER — Inpatient Hospital Stay: Payer: Self-pay | Admitting: Student

## 2013-11-24 LAB — CBC WITH DIFFERENTIAL/PLATELET
Basophil #: 0 10*3/uL (ref 0.0–0.1)
Basophil %: 0.2 %
EOS ABS: 0 10*3/uL (ref 0.0–0.7)
Eosinophil %: 0 %
HCT: 29.9 % — ABNORMAL LOW (ref 35.0–47.0)
HGB: 10.1 g/dL — ABNORMAL LOW (ref 12.0–16.0)
LYMPHS PCT: 16.4 %
Lymphocyte #: 1.4 10*3/uL (ref 1.0–3.6)
MCH: 34.3 pg — ABNORMAL HIGH (ref 26.0–34.0)
MCHC: 33.7 g/dL (ref 32.0–36.0)
MCV: 102 fL — ABNORMAL HIGH (ref 80–100)
MONOS PCT: 6.4 %
Monocyte #: 0.5 x10 3/mm (ref 0.2–0.9)
NEUTROS PCT: 77 %
Neutrophil #: 6.7 10*3/uL — ABNORMAL HIGH (ref 1.4–6.5)
Platelet: 279 10*3/uL (ref 150–440)
RBC: 2.93 10*6/uL — ABNORMAL LOW (ref 3.80–5.20)
RDW: 13.2 % (ref 11.5–14.5)
WBC: 8.6 10*3/uL (ref 3.6–11.0)

## 2013-11-24 LAB — BASIC METABOLIC PANEL
ANION GAP: 6 — AB (ref 7–16)
BUN: 32 mg/dL — ABNORMAL HIGH (ref 7–18)
CHLORIDE: 104 mmol/L (ref 98–107)
CREATININE: 2.06 mg/dL — AB (ref 0.60–1.30)
Calcium, Total: 10.8 mg/dL — ABNORMAL HIGH (ref 8.5–10.1)
Co2: 27 mmol/L (ref 21–32)
EGFR (Non-African Amer.): 21 — ABNORMAL LOW
GFR CALC AF AMER: 24 — AB
Glucose: 166 mg/dL — ABNORMAL HIGH (ref 65–99)
Osmolality: 284 (ref 275–301)
POTASSIUM: 3.9 mmol/L (ref 3.5–5.1)
SODIUM: 137 mmol/L (ref 136–145)

## 2013-11-24 LAB — PHENYTOIN LEVEL, TOTAL: Dilantin: 5.2 ug/mL — ABNORMAL LOW (ref 10.0–20.0)

## 2013-11-25 LAB — BASIC METABOLIC PANEL
Anion Gap: 5 — ABNORMAL LOW (ref 7–16)
BUN: 34 mg/dL — AB (ref 7–18)
CALCIUM: 10.6 mg/dL — AB (ref 8.5–10.1)
CHLORIDE: 104 mmol/L (ref 98–107)
CO2: 27 mmol/L (ref 21–32)
CREATININE: 1.99 mg/dL — AB (ref 0.60–1.30)
EGFR (Non-African Amer.): 22 — ABNORMAL LOW
GFR CALC AF AMER: 25 — AB
Glucose: 140 mg/dL — ABNORMAL HIGH (ref 65–99)
OSMOLALITY: 282 (ref 275–301)
POTASSIUM: 4.2 mmol/L (ref 3.5–5.1)
SODIUM: 136 mmol/L (ref 136–145)

## 2013-11-25 LAB — PHENYTOIN LEVEL, TOTAL: DILANTIN: 8.9 ug/mL — AB (ref 10.0–20.0)

## 2013-11-26 LAB — RENAL FUNCTION PANEL
ANION GAP: 6 — AB (ref 7–16)
Albumin: 2.5 g/dL — ABNORMAL LOW (ref 3.4–5.0)
BUN: 38 mg/dL — ABNORMAL HIGH (ref 7–18)
CO2: 27 mmol/L (ref 21–32)
CREATININE: 1.71 mg/dL — AB (ref 0.60–1.30)
Calcium, Total: 10.2 mg/dL — ABNORMAL HIGH (ref 8.5–10.1)
Chloride: 104 mmol/L (ref 98–107)
EGFR (Non-African Amer.): 26 — ABNORMAL LOW
GFR CALC AF AMER: 30 — AB
Glucose: 127 mg/dL — ABNORMAL HIGH (ref 65–99)
Osmolality: 284 (ref 275–301)
PHOSPHORUS: 2.6 mg/dL (ref 2.5–4.9)
Potassium: 4.2 mmol/L (ref 3.5–5.1)
Sodium: 137 mmol/L (ref 136–145)

## 2013-11-26 LAB — IRON AND TIBC
IRON BIND. CAP.(TOTAL): 188 ug/dL — AB (ref 250–450)
Iron Saturation: 27 %
Iron: 50 ug/dL (ref 50–170)
Unbound Iron-Bind.Cap.: 138 ug/dL

## 2013-11-26 LAB — TSH: Thyroid Stimulating Horm: 0.864 u[IU]/mL

## 2013-11-26 LAB — FOLATE: FOLIC ACID: 5.7 ng/mL (ref 3.1–100.0)

## 2013-11-26 LAB — FERRITIN: Ferritin (ARMC): 31 ng/mL (ref 8–388)

## 2013-11-27 LAB — BASIC METABOLIC PANEL
ANION GAP: 8 (ref 7–16)
BUN: 44 mg/dL — AB (ref 7–18)
CALCIUM: 10.2 mg/dL — AB (ref 8.5–10.1)
Chloride: 101 mmol/L (ref 98–107)
Co2: 26 mmol/L (ref 21–32)
Creatinine: 1.74 mg/dL — ABNORMAL HIGH (ref 0.60–1.30)
EGFR (Non-African Amer.): 26 — ABNORMAL LOW
GFR CALC AF AMER: 30 — AB
Glucose: 175 mg/dL — ABNORMAL HIGH (ref 65–99)
Osmolality: 286 (ref 275–301)
POTASSIUM: 5.4 mmol/L — AB (ref 3.5–5.1)
Sodium: 135 mmol/L — ABNORMAL LOW (ref 136–145)

## 2013-11-27 LAB — POTASSIUM: POTASSIUM: 5.1 mmol/L (ref 3.5–5.1)

## 2013-11-28 ENCOUNTER — Encounter: Payer: Self-pay | Admitting: Internal Medicine

## 2013-11-28 LAB — BASIC METABOLIC PANEL
Anion Gap: 5 — ABNORMAL LOW (ref 7–16)
BUN: 39 mg/dL — ABNORMAL HIGH (ref 7–18)
CO2: 30 mmol/L (ref 21–32)
CREATININE: 1.87 mg/dL — AB (ref 0.60–1.30)
Calcium, Total: 10 mg/dL (ref 8.5–10.1)
Chloride: 102 mmol/L (ref 98–107)
EGFR (African American): 27 — ABNORMAL LOW
EGFR (Non-African Amer.): 23 — ABNORMAL LOW
Glucose: 72 mg/dL (ref 65–99)
Osmolality: 282 (ref 275–301)
Potassium: 4.2 mmol/L (ref 3.5–5.1)
SODIUM: 137 mmol/L (ref 136–145)

## 2013-11-28 LAB — UR PROT ELECTROPHORESIS, URINE RANDOM

## 2013-11-29 LAB — PROTEIN ELECTROPHORESIS(ARMC)

## 2013-11-29 LAB — KAPPA/LAMBDA FREE LIGHT CHAINS (ARMC)

## 2013-12-01 ENCOUNTER — Encounter: Payer: Self-pay | Admitting: Internal Medicine

## 2013-12-02 ENCOUNTER — Emergency Department: Payer: Self-pay | Admitting: Emergency Medicine

## 2013-12-02 LAB — URINALYSIS, COMPLETE
BLOOD: NEGATIVE
Bacteria: NONE SEEN
Bilirubin,UR: NEGATIVE
Glucose,UR: NEGATIVE mg/dL (ref 0–75)
Hyaline Cast: 5
KETONE: NEGATIVE
NITRITE: NEGATIVE
Ph: 5 (ref 4.5–8.0)
Protein: 100
RBC, UR: NONE SEEN /HPF (ref 0–5)
Specific Gravity: 1.014 (ref 1.003–1.030)
Squamous Epithelial: NONE SEEN

## 2013-12-02 LAB — CBC
HCT: 26.5 % — AB (ref 35.0–47.0)
HGB: 8.8 g/dL — ABNORMAL LOW (ref 12.0–16.0)
MCH: 33.7 pg (ref 26.0–34.0)
MCHC: 33.2 g/dL (ref 32.0–36.0)
MCV: 102 fL — AB (ref 80–100)
PLATELETS: 248 10*3/uL (ref 150–440)
RBC: 2.61 10*6/uL — AB (ref 3.80–5.20)
RDW: 12.7 % (ref 11.5–14.5)
WBC: 12 10*3/uL — ABNORMAL HIGH (ref 3.6–11.0)

## 2013-12-02 LAB — BASIC METABOLIC PANEL
Anion Gap: 5 — ABNORMAL LOW (ref 7–16)
BUN: 46 mg/dL — AB (ref 7–18)
CHLORIDE: 103 mmol/L (ref 98–107)
CO2: 25 mmol/L (ref 21–32)
Calcium, Total: 9.5 mg/dL (ref 8.5–10.1)
Creatinine: 2.06 mg/dL — ABNORMAL HIGH (ref 0.60–1.30)
GFR CALC AF AMER: 24 — AB
GFR CALC NON AF AMER: 21 — AB
Glucose: 143 mg/dL — ABNORMAL HIGH (ref 65–99)
Osmolality: 281 (ref 275–301)
Potassium: 4.5 mmol/L (ref 3.5–5.1)
Sodium: 133 mmol/L — ABNORMAL LOW (ref 136–145)

## 2013-12-02 LAB — TROPONIN I

## 2013-12-08 ENCOUNTER — Observation Stay: Payer: Self-pay | Admitting: Internal Medicine

## 2013-12-08 LAB — CBC WITH DIFFERENTIAL/PLATELET
BASOS ABS: 0.1 10*3/uL (ref 0.0–0.1)
Basophil %: 0.8 %
Eosinophil #: 0.9 10*3/uL — ABNORMAL HIGH (ref 0.0–0.7)
Eosinophil %: 9.4 %
HCT: 29.1 % — AB (ref 35.0–47.0)
HGB: 9.8 g/dL — AB (ref 12.0–16.0)
LYMPHS PCT: 25.2 %
Lymphocyte #: 2.5 10*3/uL (ref 1.0–3.6)
MCH: 33.6 pg (ref 26.0–34.0)
MCHC: 33.6 g/dL (ref 32.0–36.0)
MCV: 100 fL (ref 80–100)
Monocyte #: 1 x10 3/mm — ABNORMAL HIGH (ref 0.2–0.9)
Monocyte %: 10 %
NEUTROS ABS: 5.4 10*3/uL (ref 1.4–6.5)
NEUTROS PCT: 54.6 %
Platelet: 274 10*3/uL (ref 150–440)
RBC: 2.92 10*6/uL — ABNORMAL LOW (ref 3.80–5.20)
RDW: 12.3 % (ref 11.5–14.5)
WBC: 9.9 10*3/uL (ref 3.6–11.0)

## 2013-12-08 LAB — COMPREHENSIVE METABOLIC PANEL
ALT: 20 U/L (ref 12–78)
AST: 29 U/L (ref 15–37)
Albumin: 2.7 g/dL — ABNORMAL LOW (ref 3.4–5.0)
Alkaline Phosphatase: 61 U/L
Anion Gap: 4 — ABNORMAL LOW (ref 7–16)
BUN: 46 mg/dL — ABNORMAL HIGH (ref 7–18)
Bilirubin,Total: 0.2 mg/dL (ref 0.2–1.0)
Calcium, Total: 10.5 mg/dL — ABNORMAL HIGH (ref 8.5–10.1)
Chloride: 104 mmol/L (ref 98–107)
Co2: 26 mmol/L (ref 21–32)
Creatinine: 2.62 mg/dL — ABNORMAL HIGH (ref 0.60–1.30)
EGFR (African American): 18 — ABNORMAL LOW
GFR CALC NON AF AMER: 16 — AB
Glucose: 99 mg/dL (ref 65–99)
OSMOLALITY: 280 (ref 275–301)
POTASSIUM: 5.2 mmol/L — AB (ref 3.5–5.1)
Sodium: 134 mmol/L — ABNORMAL LOW (ref 136–145)
TOTAL PROTEIN: 6.8 g/dL (ref 6.4–8.2)

## 2013-12-09 LAB — BASIC METABOLIC PANEL
ANION GAP: 8 (ref 7–16)
BUN: 48 mg/dL — ABNORMAL HIGH (ref 7–18)
CALCIUM: 10.2 mg/dL — AB (ref 8.5–10.1)
CREATININE: 2.4 mg/dL — AB (ref 0.60–1.30)
Chloride: 104 mmol/L (ref 98–107)
Co2: 24 mmol/L (ref 21–32)
EGFR (Non-African Amer.): 17 — ABNORMAL LOW
GFR CALC AF AMER: 20 — AB
Glucose: 176 mg/dL — ABNORMAL HIGH (ref 65–99)
OSMOLALITY: 289 (ref 275–301)
POTASSIUM: 4.7 mmol/L (ref 3.5–5.1)
Sodium: 136 mmol/L (ref 136–145)

## 2013-12-10 ENCOUNTER — Ambulatory Visit: Payer: Self-pay | Admitting: Internal Medicine

## 2013-12-22 LAB — URINALYSIS, COMPLETE
BILIRUBIN, UR: NEGATIVE
BLOOD: NEGATIVE
Bacteria: NONE SEEN
GLUCOSE, UR: NEGATIVE mg/dL (ref 0–75)
Ketone: NEGATIVE
NITRITE: NEGATIVE
Ph: 5 (ref 4.5–8.0)
SPECIFIC GRAVITY: 1.01 (ref 1.003–1.030)
Squamous Epithelial: <1
WBC UR: 15 /HPF (ref 0–5)

## 2013-12-24 LAB — URINE CULTURE

## 2014-01-09 ENCOUNTER — Ambulatory Visit: Payer: Self-pay | Admitting: Internal Medicine

## 2014-01-09 LAB — CBC CANCER CENTER
BASOS PCT: 1 %
Basophil #: 0.1 x10 3/mm (ref 0.0–0.1)
EOS ABS: 0.2 x10 3/mm (ref 0.0–0.7)
EOS PCT: 3.7 %
HCT: 26.1 % — AB (ref 35.0–47.0)
HGB: 8.6 g/dL — AB (ref 12.0–16.0)
LYMPHS ABS: 2 x10 3/mm (ref 1.0–3.6)
LYMPHS PCT: 30.9 %
MCH: 33.5 pg (ref 26.0–34.0)
MCHC: 33.1 g/dL (ref 32.0–36.0)
MCV: 101 fL — AB (ref 80–100)
MONO ABS: 0.8 x10 3/mm (ref 0.2–0.9)
MONOS PCT: 11.8 %
Neutrophil #: 3.4 x10 3/mm (ref 1.4–6.5)
Neutrophil %: 52.6 %
PLATELETS: 254 x10 3/mm (ref 150–440)
RBC: 2.58 10*6/uL — ABNORMAL LOW (ref 3.80–5.20)
RDW: 12.9 % (ref 11.5–14.5)
WBC: 6.4 x10 3/mm (ref 3.6–11.0)

## 2014-01-09 LAB — IRON AND TIBC
IRON BIND. CAP.(TOTAL): 244 ug/dL — AB (ref 250–450)
IRON: 113 ug/dL (ref 50–170)
Iron Saturation: 46 %
Unbound Iron-Bind.Cap.: 131 ug/dL

## 2014-01-09 LAB — FERRITIN: FERRITIN (ARMC): 30 ng/mL (ref 8–388)

## 2014-01-31 ENCOUNTER — Ambulatory Visit: Payer: Self-pay | Admitting: Internal Medicine

## 2014-02-07 ENCOUNTER — Encounter: Payer: Self-pay | Admitting: Vascular Surgery

## 2014-02-26 ENCOUNTER — Emergency Department: Payer: Self-pay | Admitting: Emergency Medicine

## 2014-02-26 LAB — CBC WITH DIFFERENTIAL/PLATELET
Basophil #: 0.1 10*3/uL (ref 0.0–0.1)
Basophil %: 0.6 %
EOS ABS: 0.1 10*3/uL (ref 0.0–0.7)
EOS PCT: 1 %
HCT: 25.7 % — AB (ref 35.0–47.0)
HGB: 8.4 g/dL — ABNORMAL LOW (ref 12.0–16.0)
Lymphocyte #: 1.6 10*3/uL (ref 1.0–3.6)
Lymphocyte %: 16.8 %
MCH: 34.3 pg — AB (ref 26.0–34.0)
MCHC: 32.8 g/dL (ref 32.0–36.0)
MCV: 105 fL — ABNORMAL HIGH (ref 80–100)
MONOS PCT: 11.7 %
Monocyte #: 1.1 x10 3/mm — ABNORMAL HIGH (ref 0.2–0.9)
NEUTROS ABS: 6.6 10*3/uL — AB (ref 1.4–6.5)
NEUTROS PCT: 69.9 %
Platelet: 205 10*3/uL (ref 150–440)
RBC: 2.46 10*6/uL — ABNORMAL LOW (ref 3.80–5.20)
RDW: 13.1 % (ref 11.5–14.5)
WBC: 9.5 10*3/uL (ref 3.6–11.0)

## 2014-02-26 LAB — COMPREHENSIVE METABOLIC PANEL
ALBUMIN: 2.9 g/dL — AB (ref 3.4–5.0)
ALK PHOS: 42 U/L — AB
ANION GAP: 9 (ref 7–16)
BILIRUBIN TOTAL: 0.2 mg/dL (ref 0.2–1.0)
BUN: 36 mg/dL — ABNORMAL HIGH (ref 7–18)
CHLORIDE: 106 mmol/L (ref 98–107)
Calcium, Total: 10.1 mg/dL (ref 8.5–10.1)
Co2: 24 mmol/L (ref 21–32)
Creatinine: 2.09 mg/dL — ABNORMAL HIGH (ref 0.60–1.30)
GFR CALC AF AMER: 24 — AB
GFR CALC NON AF AMER: 20 — AB
GLUCOSE: 104 mg/dL — AB (ref 65–99)
Osmolality: 286 (ref 275–301)
Potassium: 4.1 mmol/L (ref 3.5–5.1)
SGOT(AST): 15 U/L (ref 15–37)
SGPT (ALT): 18 U/L
Sodium: 139 mmol/L (ref 136–145)
Total Protein: 6.2 g/dL — ABNORMAL LOW (ref 6.4–8.2)

## 2014-02-26 LAB — URINALYSIS, COMPLETE
BILIRUBIN, UR: NEGATIVE
Blood: NEGATIVE
Glucose,UR: 50 mg/dL (ref 0–75)
Ketone: NEGATIVE
LEUKOCYTE ESTERASE: NEGATIVE
Nitrite: NEGATIVE
Ph: 5 (ref 4.5–8.0)
Protein: 100
RBC,UR: NONE SEEN /HPF (ref 0–5)
Specific Gravity: 1.015 (ref 1.003–1.030)

## 2014-03-03 ENCOUNTER — Emergency Department: Payer: Self-pay | Admitting: Emergency Medicine

## 2014-03-03 LAB — COMPREHENSIVE METABOLIC PANEL
Albumin: 2.8 g/dL — ABNORMAL LOW (ref 3.4–5.0)
Alkaline Phosphatase: 37 U/L — ABNORMAL LOW
Anion Gap: 9 (ref 7–16)
BUN: 24 mg/dL — AB (ref 7–18)
Bilirubin,Total: 0.2 mg/dL (ref 0.2–1.0)
CALCIUM: 10.4 mg/dL — AB (ref 8.5–10.1)
CHLORIDE: 101 mmol/L (ref 98–107)
CO2: 26 mmol/L (ref 21–32)
CREATININE: 2.02 mg/dL — AB (ref 0.60–1.30)
EGFR (African American): 25 — ABNORMAL LOW
GFR CALC NON AF AMER: 21 — AB
Glucose: 141 mg/dL — ABNORMAL HIGH (ref 65–99)
Osmolality: 278 (ref 275–301)
Potassium: 3.8 mmol/L (ref 3.5–5.1)
SGOT(AST): 11 U/L — ABNORMAL LOW (ref 15–37)
SGPT (ALT): 14 U/L
Sodium: 136 mmol/L (ref 136–145)
TOTAL PROTEIN: 6.1 g/dL — AB (ref 6.4–8.2)

## 2014-03-03 LAB — CBC
HCT: 26.1 % — ABNORMAL LOW (ref 35.0–47.0)
HGB: 8.7 g/dL — ABNORMAL LOW (ref 12.0–16.0)
MCH: 34.2 pg — AB (ref 26.0–34.0)
MCHC: 33.3 g/dL (ref 32.0–36.0)
MCV: 103 fL — ABNORMAL HIGH (ref 80–100)
Platelet: 221 10*3/uL (ref 150–440)
RBC: 2.53 10*6/uL — ABNORMAL LOW (ref 3.80–5.20)
RDW: 12.9 % (ref 11.5–14.5)
WBC: 5.7 10*3/uL (ref 3.6–11.0)

## 2014-03-03 LAB — CULTURE, BLOOD (SINGLE)

## 2014-03-06 LAB — BASIC METABOLIC PANEL
ANION GAP: 10 (ref 7–16)
BUN: 24 mg/dL — ABNORMAL HIGH (ref 7–18)
CHLORIDE: 99 mmol/L (ref 98–107)
Calcium, Total: 11.4 mg/dL — ABNORMAL HIGH (ref 8.5–10.1)
Co2: 26 mmol/L (ref 21–32)
Creatinine: 2.15 mg/dL — ABNORMAL HIGH (ref 0.60–1.30)
EGFR (African American): 23 — ABNORMAL LOW
EGFR (Non-African Amer.): 20 — ABNORMAL LOW
Glucose: 137 mg/dL — ABNORMAL HIGH (ref 65–99)
Osmolality: 276 (ref 275–301)
Potassium: 3.9 mmol/L (ref 3.5–5.1)
Sodium: 135 mmol/L — ABNORMAL LOW (ref 136–145)

## 2014-03-06 LAB — CBC
HCT: 26.2 % — ABNORMAL LOW (ref 35.0–47.0)
HGB: 9.1 g/dL — ABNORMAL LOW (ref 12.0–16.0)
MCH: 34.9 pg — ABNORMAL HIGH (ref 26.0–34.0)
MCHC: 34.5 g/dL (ref 32.0–36.0)
MCV: 101 fL — AB (ref 80–100)
PLATELETS: 246 10*3/uL (ref 150–440)
RBC: 2.6 10*6/uL — AB (ref 3.80–5.20)
RDW: 12.5 % (ref 11.5–14.5)
WBC: 7.7 10*3/uL (ref 3.6–11.0)

## 2014-03-06 LAB — TROPONIN I: Troponin-I: <0.02 ng/mL

## 2014-03-07 ENCOUNTER — Inpatient Hospital Stay: Payer: Self-pay | Admitting: Internal Medicine

## 2014-03-07 LAB — URINALYSIS, COMPLETE
BILIRUBIN, UR: NEGATIVE
Bacteria: NONE SEEN
Blood: NEGATIVE
GLUCOSE, UR: NEGATIVE mg/dL (ref 0–75)
Ketone: NEGATIVE
Leukocyte Esterase: NEGATIVE
Nitrite: NEGATIVE
PH: 7 (ref 4.5–8.0)
Protein: 100
RBC,UR: NONE SEEN /HPF (ref 0–5)
Specific Gravity: 1.006 (ref 1.003–1.030)
WBC UR: <1 /HPF (ref 0–5)

## 2014-03-07 LAB — BASIC METABOLIC PANEL
Anion Gap: 8 (ref 7–16)
BUN: 25 mg/dL — AB (ref 7–18)
CHLORIDE: 102 mmol/L (ref 98–107)
CO2: 28 mmol/L (ref 21–32)
Calcium, Total: 11.3 mg/dL — ABNORMAL HIGH (ref 8.5–10.1)
Creatinine: 2.08 mg/dL — ABNORMAL HIGH (ref 0.60–1.30)
EGFR (Non-African Amer.): 21 — ABNORMAL LOW
GFR CALC AF AMER: 24 — AB
Glucose: 160 mg/dL — ABNORMAL HIGH (ref 65–99)
Osmolality: 283 (ref 275–301)
POTASSIUM: 3.7 mmol/L (ref 3.5–5.1)
SODIUM: 138 mmol/L (ref 136–145)

## 2014-03-08 LAB — BASIC METABOLIC PANEL
Anion Gap: 7 (ref 7–16)
BUN: 25 mg/dL — ABNORMAL HIGH (ref 7–18)
CO2: 26 mmol/L (ref 21–32)
Calcium, Total: 10.5 mg/dL — ABNORMAL HIGH (ref 8.5–10.1)
Chloride: 105 mmol/L (ref 98–107)
Creatinine: 2.08 mg/dL — ABNORMAL HIGH (ref 0.60–1.30)
GFR CALC AF AMER: 24 — AB
GFR CALC NON AF AMER: 21 — AB
Glucose: 100 mg/dL — ABNORMAL HIGH (ref 65–99)
Osmolality: 280 (ref 275–301)
POTASSIUM: 3.3 mmol/L — AB (ref 3.5–5.1)
Sodium: 138 mmol/L (ref 136–145)

## 2014-03-09 LAB — CBC WITH DIFFERENTIAL/PLATELET
BASOS ABS: 0 10*3/uL (ref 0.0–0.1)
BASOS PCT: 0.6 %
EOS ABS: 0.2 10*3/uL (ref 0.0–0.7)
Eosinophil %: 2.1 %
HCT: 24.4 % — AB (ref 35.0–47.0)
HGB: 8.4 g/dL — ABNORMAL LOW (ref 12.0–16.0)
Lymphocyte #: 1.9 10*3/uL (ref 1.0–3.6)
Lymphocyte %: 24.9 %
MCH: 35 pg — ABNORMAL HIGH (ref 26.0–34.0)
MCHC: 34.5 g/dL (ref 32.0–36.0)
MCV: 102 fL — AB (ref 80–100)
Monocyte #: 0.7 x10 3/mm (ref 0.2–0.9)
Monocyte %: 8.6 %
Neutrophil #: 4.9 10*3/uL (ref 1.4–6.5)
Neutrophil %: 63.8 %
Platelet: 239 10*3/uL (ref 150–440)
RBC: 2.4 10*6/uL — AB (ref 3.80–5.20)
RDW: 12.9 % (ref 11.5–14.5)
WBC: 7.7 10*3/uL (ref 3.6–11.0)

## 2014-03-09 LAB — BASIC METABOLIC PANEL
Anion Gap: 6 — ABNORMAL LOW (ref 7–16)
BUN: 29 mg/dL — ABNORMAL HIGH (ref 7–18)
CALCIUM: 9.9 mg/dL (ref 8.5–10.1)
CO2: 25 mmol/L (ref 21–32)
CREATININE: 1.95 mg/dL — AB (ref 0.60–1.30)
Chloride: 108 mmol/L — ABNORMAL HIGH (ref 98–107)
GFR CALC AF AMER: 26 — AB
GFR CALC NON AF AMER: 22 — AB
Glucose: 86 mg/dL (ref 65–99)
OSMOLALITY: 283 (ref 275–301)
POTASSIUM: 3.7 mmol/L (ref 3.5–5.1)
SODIUM: 139 mmol/L (ref 136–145)

## 2014-03-10 ENCOUNTER — Encounter: Payer: Self-pay | Admitting: Internal Medicine

## 2014-03-10 ENCOUNTER — Encounter: Payer: Self-pay | Admitting: Vascular Surgery

## 2014-04-03 ENCOUNTER — Encounter: Payer: Self-pay | Admitting: Internal Medicine

## 2014-04-06 ENCOUNTER — Ambulatory Visit: Payer: Self-pay | Admitting: Internal Medicine

## 2014-04-06 LAB — CANCER CENTER HEMOGLOBIN: HGB: 9.4 g/dL — AB (ref 12.0–16.0)

## 2014-05-03 ENCOUNTER — Ambulatory Visit: Payer: Self-pay | Admitting: Internal Medicine

## 2014-06-27 ENCOUNTER — Ambulatory Visit: Payer: Self-pay | Admitting: Internal Medicine

## 2014-07-02 ENCOUNTER — Encounter: Payer: Self-pay | Admitting: Surgery

## 2014-07-03 ENCOUNTER — Encounter: Payer: Self-pay | Admitting: Surgery

## 2014-08-03 ENCOUNTER — Encounter: Payer: Self-pay | Admitting: Surgery

## 2014-08-20 ENCOUNTER — Encounter: Payer: Self-pay | Admitting: Surgery

## 2014-09-03 ENCOUNTER — Encounter: Payer: Self-pay | Admitting: Surgery

## 2014-09-20 ENCOUNTER — Encounter: Payer: Self-pay | Admitting: Surgery

## 2014-09-29 ENCOUNTER — Emergency Department: Payer: Self-pay | Admitting: Emergency Medicine

## 2014-10-02 ENCOUNTER — Encounter
Admit: 2014-10-02 | Disposition: A | Discharge status: Home / Self Care | Payer: Self-pay | Attending: Surgery | Admitting: Surgery

## 2014-10-06 ENCOUNTER — Emergency Department: Payer: Self-pay | Admitting: Emergency Medicine

## 2014-10-07 ENCOUNTER — Emergency Department: Payer: Self-pay | Admitting: Emergency Medicine

## 2014-10-12 ENCOUNTER — Ambulatory Visit: Payer: Self-pay | Admitting: Orthopedic Surgery

## 2014-10-14 ENCOUNTER — Emergency Department: Payer: Self-pay | Admitting: Emergency Medicine

## 2014-10-30 ENCOUNTER — Emergency Department: Payer: Self-pay | Admitting: Emergency Medicine

## 2014-10-30 LAB — BASIC METABOLIC PANEL
ANION GAP: 7 (ref 7–16)
BUN: 39 mg/dL — AB
Calcium, Total: 9.8 mg/dL
Chloride: 106 mmol/L
Co2: 26 mmol/L
Creatinine: 2.1 mg/dL — ABNORMAL HIGH
EGFR (Non-African Amer.): 20 — ABNORMAL LOW
GFR CALC AF AMER: 24 — AB
GLUCOSE: 148 mg/dL — AB
Potassium: 4.7 mmol/L
Sodium: 139 mmol/L

## 2014-10-30 LAB — CBC
HCT: 28.9 % — AB (ref 35.0–47.0)
HGB: 9.2 g/dL — AB (ref 12.0–16.0)
MCH: 32.3 pg (ref 26.0–34.0)
MCHC: 32 g/dL (ref 32.0–36.0)
MCV: 101 fL — ABNORMAL HIGH (ref 80–100)
Platelet: 239 10*3/uL (ref 150–440)
RBC: 2.86 10*6/uL — ABNORMAL LOW (ref 3.80–5.20)
RDW: 13 % (ref 11.5–14.5)
WBC: 7.9 10*3/uL (ref 3.6–11.0)

## 2014-10-30 LAB — TROPONIN I: Troponin-I: <0.03 ng/mL

## 2014-11-02 ENCOUNTER — Ambulatory Visit
Admit: 2014-11-02 | Disposition: A | Discharge status: Home / Self Care | Payer: Self-pay | Attending: Internal Medicine | Admitting: Internal Medicine

## 2014-11-02 ENCOUNTER — Encounter
Admit: 2014-11-02 | Disposition: A | Discharge status: Home / Self Care | Payer: Self-pay | Attending: Surgery | Admitting: Surgery

## 2014-11-03 ENCOUNTER — Inpatient Hospital Stay
Admit: 2014-11-03 | Disposition: A | Discharge status: Skilled Nursing Facility | Payer: Self-pay | Attending: Internal Medicine | Admitting: Internal Medicine

## 2014-11-03 LAB — COMPREHENSIVE METABOLIC PANEL
ALBUMIN: 3 g/dL — AB
ALT: 15 U/L
AST: 19 U/L
Alkaline Phosphatase: 53 U/L
Anion Gap: 10 (ref 7–16)
BUN: 39 mg/dL — ABNORMAL HIGH
Bilirubin,Total: 0.5 mg/dL
CALCIUM: 10.2 mg/dL
CHLORIDE: 105 mmol/L
Co2: 23 mmol/L
Creatinine: 2.38 mg/dL — ABNORMAL HIGH
EGFR (Non-African Amer.): 17 — ABNORMAL LOW
GFR CALC AF AMER: 20 — AB
Glucose: 105 mg/dL — ABNORMAL HIGH
Potassium: 4.4 mmol/L
Sodium: 138 mmol/L
Total Protein: 6 g/dL — ABNORMAL LOW

## 2014-11-03 LAB — TROPONIN I

## 2014-11-03 LAB — CBC
HCT: 29.3 % — AB (ref 35.0–47.0)
HGB: 9.5 g/dL — AB (ref 12.0–16.0)
MCH: 32.4 pg (ref 26.0–34.0)
MCHC: 32.3 g/dL (ref 32.0–36.0)
MCV: 100 fL (ref 80–100)
Platelet: 256 10*3/uL (ref 150–440)
RBC: 2.92 10*6/uL — AB (ref 3.80–5.20)
RDW: 13.4 % (ref 11.5–14.5)
WBC: 9.7 10*3/uL (ref 3.6–11.0)

## 2014-11-03 LAB — URINALYSIS, COMPLETE
BACTERIA: NONE SEEN
BLOOD: NEGATIVE
Bilirubin,UR: NEGATIVE
Glucose,UR: 50 mg/dL (ref 0–75)
Hyaline Cast: 10
Leukocyte Esterase: NEGATIVE
Nitrite: NEGATIVE
Ph: 5 (ref 4.5–8.0)
RBC,UR: 1 /HPF (ref 0–5)
Renal Epithelial: 30
SPECIFIC GRAVITY: 1.013 (ref 1.003–1.030)
Squamous Epithelial: NONE SEEN
WBC UR: 2 /HPF (ref 0–5)

## 2014-11-04 LAB — CBC WITH DIFFERENTIAL/PLATELET
BASOS PCT: 0.3 %
Basophil #: 0 10*3/uL (ref 0.0–0.1)
EOS ABS: 0 10*3/uL (ref 0.0–0.7)
Eosinophil %: 0.1 %
HCT: 27.7 % — ABNORMAL LOW (ref 35.0–47.0)
HGB: 9 g/dL — AB (ref 12.0–16.0)
Lymphocyte #: 0.9 10*3/uL — ABNORMAL LOW (ref 1.0–3.6)
Lymphocyte %: 12.8 %
MCH: 32.9 pg (ref 26.0–34.0)
MCHC: 32.3 g/dL (ref 32.0–36.0)
MCV: 102 fL — ABNORMAL HIGH (ref 80–100)
MONO ABS: 0.6 x10 3/mm (ref 0.2–0.9)
Monocyte %: 7.7 %
Neutrophil #: 5.7 10*3/uL (ref 1.4–6.5)
Neutrophil %: 79.1 %
PLATELETS: 249 10*3/uL (ref 150–440)
RBC: 2.72 10*6/uL — ABNORMAL LOW (ref 3.80–5.20)
RDW: 13.7 % (ref 11.5–14.5)
WBC: 7.2 10*3/uL (ref 3.6–11.0)

## 2014-11-04 LAB — URINALYSIS, COMPLETE
BILIRUBIN, UR: NEGATIVE
BLOOD: NEGATIVE
Bacteria: NONE SEEN
Glucose,UR: 50 mg/dL (ref 0–75)
Hyaline Cast: 2
Ketone: NEGATIVE
LEUKOCYTE ESTERASE: NEGATIVE
Nitrite: NEGATIVE
PH: 5 (ref 4.5–8.0)
Protein: >=500
RBC,UR: NONE SEEN /HPF (ref 0–5)
Specific Gravity: 1.014 (ref 1.003–1.030)
Squamous Epithelial: NONE SEEN
WBC UR: 2 /HPF (ref 0–5)

## 2014-11-04 LAB — BASIC METABOLIC PANEL
Anion Gap: 2 — ABNORMAL LOW (ref 7–16)
BUN: 38 mg/dL — ABNORMAL HIGH
CALCIUM: 9.1 mg/dL
CHLORIDE: 106 mmol/L
Co2: 26 mmol/L
Creatinine: 2.33 mg/dL — ABNORMAL HIGH
EGFR (African American): 21 — ABNORMAL LOW
EGFR (Non-African Amer.): 18 — ABNORMAL LOW
Glucose: 88 mg/dL
Potassium: 4.7 mmol/L
Sodium: 134 mmol/L — ABNORMAL LOW

## 2014-11-04 LAB — MAGNESIUM: Magnesium: 2.2 mg/dL

## 2014-11-05 LAB — BASIC METABOLIC PANEL
ANION GAP: 11 (ref 7–16)
BUN: 37 mg/dL — ABNORMAL HIGH
CO2: 21 mmol/L — AB
Calcium, Total: 9.1 mg/dL
Chloride: 103 mmol/L
Creatinine: 2.65 mg/dL — ABNORMAL HIGH
EGFR (Non-African Amer.): 15 — ABNORMAL LOW
GFR CALC AF AMER: 18 — AB
Glucose: 68 mg/dL
Potassium: 4.2 mmol/L
Sodium: 135 mmol/L

## 2014-11-05 LAB — PHENYTOIN LEVEL, TOTAL: Dilantin: 5.8 ug/mL — ABNORMAL LOW

## 2014-11-06 LAB — BASIC METABOLIC PANEL
Anion Gap: 10 (ref 7–16)
BUN: 41 mg/dL — AB
CO2: 20 mmol/L — AB
Calcium, Total: 8.8 mg/dL — ABNORMAL LOW
Chloride: 101 mmol/L
Creatinine: 2.92 mg/dL — ABNORMAL HIGH
EGFR (African American): 16 — ABNORMAL LOW
EGFR (Non-African Amer.): 14 — ABNORMAL LOW
Glucose: 72 mg/dL
Potassium: 4.3 mmol/L
SODIUM: 131 mmol/L — AB

## 2014-11-07 LAB — BASIC METABOLIC PANEL
Anion Gap: 8 (ref 7–16)
BUN: 44 mg/dL — AB
CHLORIDE: 104 mmol/L
CO2: 22 mmol/L
Calcium, Total: 9 mg/dL
Creatinine: 2.9 mg/dL — ABNORMAL HIGH
EGFR (Non-African Amer.): 14 — ABNORMAL LOW
GFR CALC AF AMER: 16 — AB
GLUCOSE: 99 mg/dL
Potassium: 4.1 mmol/L
Sodium: 134 mmol/L — ABNORMAL LOW

## 2014-11-08 ENCOUNTER — Encounter
Admit: 2014-11-08 | Disposition: A | Discharge status: Home / Self Care | Payer: Self-pay | Attending: Internal Medicine | Admitting: Internal Medicine

## 2014-11-08 LAB — CULTURE, BLOOD (SINGLE)

## 2014-11-19 ENCOUNTER — Inpatient Hospital Stay
Admit: 2014-11-19 | Disposition: A | Discharge status: Skilled Nursing Facility | Payer: Self-pay | Attending: Internal Medicine | Admitting: Internal Medicine

## 2014-11-19 DIAGNOSIS — I509 Heart failure, unspecified: Secondary | ICD-10-CM

## 2014-11-19 LAB — PROTIME-INR
INR: 1
PROTHROMBIN TIME: 12.9 s

## 2014-11-19 LAB — COMPREHENSIVE METABOLIC PANEL
ANION GAP: 8 (ref 7–16)
AST: 20 U/L
Albumin: 3 g/dL — ABNORMAL LOW
Alkaline Phosphatase: 46 U/L
BUN: 53 mg/dL — ABNORMAL HIGH
Bilirubin,Total: 0.5 mg/dL
Calcium, Total: 9.6 mg/dL
Chloride: 102 mmol/L
Co2: 22 mmol/L
Creatinine: 2.09 mg/dL — ABNORMAL HIGH
GFR CALC AF AMER: 24 — AB
GFR CALC NON AF AMER: 20 — AB
Glucose: 89 mg/dL
POTASSIUM: 5.3 mmol/L — AB
SGPT (ALT): 15 U/L
SODIUM: 132 mmol/L — AB
Total Protein: 6.2 g/dL — ABNORMAL LOW

## 2014-11-19 LAB — HEMOGLOBIN: HGB: 7.6 g/dL — AB (ref 12.0–16.0)

## 2014-11-19 LAB — CBC
HCT: 25.1 % — ABNORMAL LOW (ref 35.0–47.0)
HGB: 8.1 g/dL — ABNORMAL LOW (ref 12.0–16.0)
MCH: 32.1 pg (ref 26.0–34.0)
MCHC: 32.1 g/dL (ref 32.0–36.0)
MCV: 100 fL (ref 80–100)
PLATELETS: 328 10*3/uL (ref 150–440)
RBC: 2.51 10*6/uL — ABNORMAL LOW (ref 3.80–5.20)
RDW: 13.5 % (ref 11.5–14.5)
WBC: 9.7 10*3/uL (ref 3.6–11.0)

## 2014-11-19 LAB — APTT: ACTIVATED PTT: 26.9 s (ref 23.6–35.9)

## 2014-11-19 LAB — TROPONIN I
Troponin-I: <0.03 ng/mL
Troponin-I: 0.03 ng/mL
Troponin-I: 0.04 ng/mL — ABNORMAL HIGH

## 2014-11-19 LAB — CK TOTAL AND CKMB (NOT AT ARMC)
CK, TOTAL: 23 U/L — AB
CK, TOTAL: 23 U/L — AB
CK, Total: 30 U/L — ABNORMAL LOW
CK-MB: 1.9 ng/mL
CK-MB: 2.3 ng/mL
CK-MB: 2.7 ng/mL

## 2014-11-19 LAB — D-DIMER(ARMC): D-DIMER: 2284 ng/mL

## 2014-11-19 LAB — PRO B NATRIURETIC PEPTIDE: B-Type Natriuretic Peptide: 190 pg/mL — ABNORMAL HIGH

## 2014-11-20 LAB — CBC WITH DIFFERENTIAL/PLATELET
BASOS PCT: 0.5 %
Basophil #: 0 10*3/uL (ref 0.0–0.1)
EOS PCT: 1.1 %
Eosinophil #: 0.1 10*3/uL (ref 0.0–0.7)
HCT: 21.5 % — ABNORMAL LOW (ref 35.0–47.0)
HGB: 7 g/dL — AB (ref 12.0–16.0)
LYMPHS ABS: 1 10*3/uL (ref 1.0–3.6)
Lymphocyte %: 11.4 %
MCH: 32.5 pg (ref 26.0–34.0)
MCHC: 32.6 g/dL (ref 32.0–36.0)
MCV: 100 fL (ref 80–100)
MONO ABS: 0.7 x10 3/mm (ref 0.2–0.9)
MONOS PCT: 7.5 %
Neutrophil #: 7.1 10*3/uL — ABNORMAL HIGH (ref 1.4–6.5)
Neutrophil %: 79.5 %
Platelet: 284 10*3/uL (ref 150–440)
RBC: 2.16 10*6/uL — ABNORMAL LOW (ref 3.80–5.20)
RDW: 13.5 % (ref 11.5–14.5)
WBC: 8.9 10*3/uL (ref 3.6–11.0)

## 2014-11-20 LAB — BASIC METABOLIC PANEL
Anion Gap: 4 — ABNORMAL LOW (ref 7–16)
BUN: 52 mg/dL — AB
CHLORIDE: 104 mmol/L
Calcium, Total: 8.6 mg/dL — ABNORMAL LOW
Co2: 24 mmol/L
Creatinine: 2.07 mg/dL — ABNORMAL HIGH
EGFR (Non-African Amer.): 21 — ABNORMAL LOW
GFR CALC AF AMER: 24 — AB
Glucose: 100 mg/dL — ABNORMAL HIGH
POTASSIUM: 5.2 mmol/L — AB
Sodium: 132 mmol/L — ABNORMAL LOW

## 2014-11-20 LAB — PHENYTOIN LEVEL, TOTAL: DILANTIN: 4.4 ug/mL — AB

## 2014-11-20 LAB — TROPONIN I: Troponin-I: 0.03 ng/mL

## 2014-11-20 NOTE — H&P (Signed)
PATIENT NAME:  Kristina Wilson, Bob R MR#:  [id number] DATE OF BIRTH:  [birth year]  DATE OF ADMISSION:  07/19/2012  REFERRING PHYSICIAN:  Lucrezia EuropeAllison Webster, MD  PRIMARY CARE PHYSICIAN:  Dewaine Oatsenny Tate, MD  CHIEF COMPLAINT: Weakness, cough and uncontrolled blood pressure.   HISTORY OF PRESENT ILLNESS: This is an 81 year old female with significant past medical history of hypertension, seizures, hemorrhagic CVA in the past, degenerative joint disease, chronic back pain, who presents with complaints of weakness and cough. The patient reports she checked her blood pressure at home, which was elevated more than 200. As well, she reports she has been feeling weak. The patient was in the ED two days before where she was diagnosed with pneumonia, discharged on p.o. Levaquin. The patient reports she is still having cough even though nonproductive. As well, the patient was found to have worsening renal failure. She is known to have chronic kidney disease with baseline creatinine 1.5. Today, it was 1.78. As well, she was diagnosed with UTI during her ED visit, and her urinalysis much improved on p.o. Levaquin, but it is still showing trace leukocyte esterase and 4 white blood cells. The hospitalist service was requested to admit the patient as she did not show significant improvement on p.o. Levaquin for her pneumonia, as well as to receive gentle hydration for her worsening renal failure, as well as for management of her uncontrolled blood pressure. The patient denies any chest pain, any coffee-grounds emesis, any abdominal pain, any nausea, vomiting, diarrhea or constipation. She complains of feeling generally weak and reports she is having decreased appetite and p.o. intake.   PAST MEDICAL HISTORY: 1.  Hypertension.  2.  Seizure disorder.  3.  Hemorrhagic CVA.  4.  Remote history of lower extremity DVT, currently not on any anticoagulation.  5.  Degenerative joint disease.  6.  Sleep apnea.  7.  History of  radiculopathy. 8.  Chronic kidney disease.   ALLERGIES:   1.  TRAMADOL  2.  VICODIN. 3.  HYDROCODONE. 4.  CODEINE.  HOME MEDICATIONS: 1.  Levaquin 100 mg oral daily.  2.  Clonidine 0.1 mg p.o. b.i.d.  3.  Enalapril 20 mg oral daily.  4.  Hydrochlorothiazide 25 mg oral daily.  5.  Diltiazem 240 mg daily.  6.  Phenytoin 200 mg at bedtime.  7.  Potassium chloride 10 mEq oral daily.  8.  Coreg 3.125 mg oral at bedtime.  9.  Skelaxin 800 mg oral 3 times a day.  10.  Aspirin 81 mg oral daily.   SOCIAL HISTORY: The patient is a widow, quit smoking more than 40 years ago. No history of alcohol or drug abuse.   FAMILY HISTORY: Significant for coronary artery disease.   REVIEW OF SYSTEMS:  The patient denies any fever or chills, but complains of generalized weakness and fatigue.  EYES: Denies blurry vision, double vision or pain.  ENT: Denies tinnitus, ear pain, hearing loss, epistaxis, but complains of runny nose.  RESPIRATORY: Complains of cough, nonproductive. Denies any wheezing, hemoptysis. Has complaints of mild dyspnea.  CARDIOVASCULAR: Denies chest pain, arrhythmia, palpitations, syncope.  GASTROINTESTINAL: Denies nausea, vomiting, diarrhea, abdominal pain, hematemesis, melena.  GENITOURINARY: Denies dysuria, hematuria, renal colic.  ENDOCRINE:  Denies polyuria or polydipsia, heat or cold intolerance.  HEMATOLOGY: Denies anemia, easy bruising, bleeding diathesis. Has history of previous DVT in the past.  INTEGUMENTARY: Denies acne, rash or skin lesions.  MUSCULOSKELETAL: Denies any gout, cramps. Has history of arthritis, using a walker on ambulation.  NEUROLOGIC: Denies ataxia, dementia, migraine. Has history of hemorrhagic CVA in the past. Has history of seizures as well.  PSYCHIATRIC: Denies anxiety, insomnia, schizophrenia, nervousness.   PHYSICAL EXAMINATION: VITAL SIGNS: Temperature 98.3, pulse 83, respiratory rate 18, blood pressure 188/69, saturating 96% on room air.   GENERAL: Morbidly obese female who looks comfortable, but in no apparent distress.  HEENT: Head atraumatic, normocephalic. Pupils equal, reactive to light. Pink conjunctivae. Anicteric sclerae. Moist oral mucosa.  NECK: Supple. No thyromegaly. No JVD.  CHEST: Good air entry bilaterally. No wheezing, rhonchi. Has rales in the left lung.  CARDIOVASCULAR: S1, S2 heard. No rubs, murmur or gallops.  ABDOMEN: Obese, soft, nontender, nondistended. Bowel sounds present.  EXTREMITIES: No edema. No clubbing. No cyanosis.  PSYCHIATRIC: Appropriate affect, awake, oriented x 3. Intact judgment and insight. Motor 5 out of 5 all extremities.  NEUROLOGIC: Cranial nerves grossly intact.  SKIN: Has chronic lower extremity erythema and dry skin.  LABORATORY DATA:   Glucose 130, BUN 31, creatinine 1.78, sodium 130, potassium 4.1, chloride 96, CO2 of 23. White blood cells 12.8, hemoglobin 11.7, hematocrit 35.7, platelets 267. Urinalysis showing trace leukocyte esterase and 4 white blood cells.   ASSESSMENT AND PLAN: This is an 81 year old female who presents with progressive weakness, cough, mild shortness of breath and uncontrolled hypertension.  1.  Pneumonia. The patient has retrocardiac opacity on chest x-ray lateral film.  We will start on intravenous Rocephin and Zithromax as she did not improve on p.o. Levaquin as an outpatient.  2.  Acute renal failure. The patient's baseline creatinine is 1.4, currently is 1.78. We will hold lisinopril. We will continue with intravenous fluids. We will monitor closely.  3.  Hypertension, uncontrolled. We will resume the patient's home medication. As well, we will add p.o. hydralazine 25 mg p.o. q.i.d. and intravenous hydralazine as needed.  4.  Urinary tract infection. Continue with Rocephin.  5. Hyponatremia, most likely due to volume depletion as the patient has been having poor appetite, decreased p.o. intake. We will continue with fluids.  We will recheck in a.m.  6.   History of seizures. Continue on phenytoin.  7.  Deep venous thrombosis prophylaxis. Subcutaneous heparin.   CODE STATUS: Full code.   TOTAL TIME SPENT ON ADMISSION AND PATIENT CARE: 50 minutes.    ____________________________ Starleen Arms, MD dse:ct D: 07/19/2012 06:23:00 ET T: 07/19/2012 09:02:50 ET JOB#: [id number]  cc: Starleen Arms, MD, <Dictator> Daissy Yerian Teena Irani MD ELECTRONICALLY SIGNED 07/27/2012 12:21

## 2014-11-21 LAB — CBC WITH DIFFERENTIAL/PLATELET
BASOS PCT: 0.7 %
Basophil #: 0.1 10*3/uL (ref 0.0–0.1)
EOS ABS: 0.2 10*3/uL (ref 0.0–0.7)
Eosinophil %: 2.1 %
HCT: 24.4 % — AB (ref 35.0–47.0)
HGB: 8 g/dL — AB (ref 12.0–16.0)
Lymphocyte #: 1.3 10*3/uL (ref 1.0–3.6)
Lymphocyte %: 13.8 %
MCH: 32 pg (ref 26.0–34.0)
MCHC: 32.7 g/dL (ref 32.0–36.0)
MCV: 98 fL (ref 80–100)
MONO ABS: 0.9 x10 3/mm (ref 0.2–0.9)
Monocyte %: 9.3 %
NEUTROS ABS: 7 10*3/uL — AB (ref 1.4–6.5)
NEUTROS PCT: 74.1 %
Platelet: 259 10*3/uL (ref 150–440)
RBC: 2.49 10*6/uL — AB (ref 3.80–5.20)
RDW: 14.1 % (ref 11.5–14.5)
WBC: 9.5 10*3/uL (ref 3.6–11.0)

## 2014-11-21 LAB — BASIC METABOLIC PANEL
Anion Gap: 3 — ABNORMAL LOW (ref 7–16)
BUN: 53 mg/dL — AB
CHLORIDE: 106 mmol/L
CO2: 25 mmol/L
CREATININE: 2.14 mg/dL — AB
Calcium, Total: 9 mg/dL
GFR CALC AF AMER: 23 — AB
GFR CALC NON AF AMER: 20 — AB
GLUCOSE: 92 mg/dL
Potassium: 5 mmol/L
SODIUM: 134 mmol/L — AB

## 2014-11-23 NOTE — Consult Note (Signed)
General Aspect The patient is an 81 year old female  is presenting to the ER with a chief complaint of garbled speech and unable to follow verbal commands for approximately four minutes. Patient's daughter was with her and reported that they both were watching TV when the patient started mumbling. At that time, she was unable to follow any verbal commands. The patient admits to visual changes but denies any loss of consciousness. No facial droop. Patient became lethargic, according to the daughter.  This episode lasted for approximately 4 to 5 minutes and the patient spontaneously recovered to her baseline.   Thirty minutes prior to this event, the patient had epistaxis, and her systolic blood pressure was at around 200s at that time.  Apparently she has been running blood pressures of 185 systolic. She has an old history of stroke and some left-sided weakness, which is chronic in nature.   The patient's CAT scan of the head in the ER revealed no acute findings. Denies any headache, blurry vision, double vision.   Present Illness Past medical history: 1.  hypertension 2.  seizure disorder 3.  history of hemorrhagic stroke in year 1974 4.  sleep apnea 5.  chronic kidney disease 6.  remote history of deep venous thrombosis   Home Medications: Medication Instructions Status  hydrALAZINE 50 mg oral tablet 1 tab(s) orally 3 times a day Active  Aspir-Low 81 mg oral delayed release tablet 1 tab(s) orally once a day Active  clonidine 0.1 mg/12 hr oral tablet, extended release 1 tab(s) orally 2 times a day Active  phenytoin 100 mg oral capsule, extended release 2 cap(s) orally once a day (at bedtime) Active  Skelaxin 800 mg oral tablet 1 tab(s) orally 2 times a day Active  carvedilol 3.125 mg oral tablet 1 tab(s) orally once a day (at bedtime) Active  Senna Lax 8.6 mg oral tablet 1 tab(s) orally once a day (at bedtime) Active  ferrous sulfate 325 mg oral tablet 1 tab(s) orally 3 times a day Active   diltiazem 240 mg/24 hours oral tablet, extended release 1 tab(s) orally 2 times a day Active  enalapril 20 mg oral tablet 1 tab(s) orally 2 times a day Active    Codeine: Unknown  Tramadol: SOB, Resp. Distress  Oxycodone: Dizzy/Fainting, Weakness  Vicodin: Other  Hydrocodone: Other  Case History:  Family History Non-Contributory   Social History negative tobacco, negative ETOH, negative Illicit drugs   Review of Systems:  Fever/Chills No   Cough No   Sputum No   Abdominal Pain No   Diarrhea No   Constipation No   Nausea/Vomiting No   SOB/DOE No   Chest Pain No   Telemetry Reviewed NSR   Dysuria No   Physical Exam:  GEN well developed, well nourished, no acute distress, obese   HEENT hearing intact to voice, moist oral mucosa   NECK supple  trachea midline   RESP normal resp effort  no use of accessory muscles   CARD regular rate  no JVD   ABD denies tenderness  nondistended   EXTR negative cyanosis/clubbing, negative edema   SKIN normal to palpation, No rashes, No ulcers   NEURO cranial nerves intact, L side weakness, follows commands   PSYCH alert, A+O to time, place, person   Nursing/Ancillary Notes: **Vital Signs.:   14-Dec-14 07:27  Vital Signs Type Q 4hr  Temperature Temperature (F) 98.2  Celsius 36.7  Temperature Source oral  Pulse Pulse 78  Respirations Respirations 18  Systolic BP  Systolic BP 431  Diastolic BP (mmHg) Diastolic BP (mmHg) 62  Mean BP 103  Pulse Ox % Pulse Ox % 96  Pulse Ox Activity Level  At rest  Oxygen Delivery Room Air/ 21 %   Thyroid:  14-Dec-14 04:05   Thyroid Stimulating Hormone 1.99 (0.45-4.50 (International Unit)  ----------------------- Pregnant patients have  different reference  ranges for TSH:  - - - - - - - - - -  Pregnant, first trimetser:  0.36 - 2.50 uIU/mL)  Routine Chem:  14-Dec-14 04:05   Cholesterol, Serum  206  Triglycerides, Serum  259  HDL (INHOUSE) 42  VLDL Cholesterol  Calculated  52  LDL Cholesterol Calculated  112 (Result(s) reported on 16 Jul 2013 at 04:57AM.)  Glucose, Serum  117  BUN  23  Creatinine (comp)  2.20  Sodium, Serum 137  Potassium, Serum 4.0  Chloride, Serum 107  CO2, Serum 26  Calcium (Total), Serum  10.8  Anion Gap  4  Osmolality (calc) 279  eGFR (African American)  22  eGFR (Non-African American)  19 (eGFR values <75m/min/1.73 m2 may be an indication of chronic kidney disease (CKD). Calculated eGFR is useful in patients with stable renal function. The eGFR calculation will not be reliable in acutely ill patients when serum creatinine is changing rapidly. It is not useful in  patients on dialysis. The eGFR calculation may not be applicable to patients at the low and high extremes of body sizes, pregnant women, and vegetarians.)  Hemoglobin A1c ([facility]) 5.3 (The American Diabetes Association recommends that a primary goal of therapy should be <7% and that physicians should reevaluate the treatment regimen in patients with HbA1c values consistently >8%.)  Routine Hem:  14-Dec-14 04:05   WBC (CBC) 8.5  RBC (CBC)  2.69  Hemoglobin (CBC)  8.8  Hematocrit (CBC)  26.2  Platelet Count (CBC) 267  MCV 98  MCH 32.7  MCHC 33.5  RDW  15.0  Neutrophil % 60.9  Lymphocyte % 20.8  Monocyte % 10.1  Eosinophil % 7.3  Basophil % 0.9  Neutrophil # 5.2  Lymphocyte # 1.8  Monocyte # 0.9  Eosinophil # 0.6  Basophil # 0.1 (Result(s) reported on 16 Jul 2013 at 04:34AM.)   [place]    13-Dec-14 12:16, [place]  [place] Bilateral   REASON FOR EXAM:    tia  COMMENTS:       PROCEDURE: [place] - [place] DOPPLER BILATERAL  - Jul 15 2013 12:16PM     CLINICAL DATA:  TIA symptoms    EXAM:  BILATERAL CAROTID DUPLEX ULTRASOUND    TECHNIQUE:  GPearline Cablesscale imaging, color Doppler and duplex ultrasound were  performed of bilateral carotid and vertebral arteries in the neck.    COMPARISON:  None.  FINDINGS:  Criteria:  Quantification of carotid stenosis is based on velocity  parameters that correlate the residual internal carotid diameter  with NASCET-based stenosis levels, using the diameter of the distal  internal carotid lumen as the denominator for stenosis measurement.    The following velocity measurements were obtained:    RIGHT    ICA:  77/18 cm/sec    CCA:  1540/08cm/sec    SYSTOLIC ICA/CCA RATIO:  06.76 DIASTOLIC ICA/CCA RATIO:  1.3    ECA:  115 cm/sec    LEFT    ICA:  300/78 cm/sec    CCA:  819/50cm/sec    SYSTOLIC ICA/CCA RATIO:  3.6    DIASTOLIC ICA/CCA  RATIO:  4.5    ECA:  159 cm/sec  RIGHT CAROTID ARTERY: Mild plaque formation is noted in the region  of the carotid bulb. No focal hemodynamically significant stenosis  is noted.    RIGHT VERTEBRAL ARTERY:  Antegrade    LEFT CAROTID ARTERY: Heavily calcified plaque is noted in the with  proximal internal carotid artery. Evaluation of the waveforms  velocities and flow velocity ratios suggest a stenosis of greater  than 70%.    LEFT VERTEBRAL ARTERY:  Antegrade in nature.     IMPRESSION:  No focal stenosis in the right internal carotid artery.  Greater than 70% stenosis in the left internal carotid artery.      Electronically Signed    By: Inez Catalina M.D.    On: 07/15/2013 12:25         Verified By: Everlene Farrier, M.D.,  MRI:    13-Dec-14 11:28, MRI Brain Without Contrast  MRI Brain Without Contrast   REASON FOR EXAM:    AMS/possible TIA  COMMENTS:       PROCEDURE: MR  - MR BRAIN WO CONTRAST  - Jul 15 2013 11:28AM     CLINICAL DATA:  Altered mental status.  Possible TIA.    EXAM:  MRI HEAD WITHOUT CONTRAST    TECHNIQUE:  Multiplanar, multiecho pulse sequences of the brain and surrounding  structures were obtained without intravenous contrast.    COMPARISON:  CT 07/14/2013  FINDINGS:  Negative for acute infarct.    Ventricle size is normal. Several small white matter  hyperintensities  bilaterally consistent with chronic microvascular  ischemia. Brainstem and cerebellum are normal.    Negative for hemorrhage or mass. No shift of the midline structures.    Mild mucosal edema in the maxillary sinus bilaterally and in the  mastoid sinus bilaterally.     IMPRESSION:  No acute infarct identified. Mild chronic microvascular ischemic  change  Sinusitis      Electronically Signed    By: Franchot Gallo M.D.    On: 07/15/2013 11:33         Verified By: Truett Perna, M.D.,    Impression 1.  Transient ischemic attack.           MRI negative for acute infarct            continue telemetry           neuro checks           continue aspirin and statin.  2.  Carotid Stenosis            left ICA reported as >70%             ideally would get CTA but patient has significant renal disease             therefore will need selective carotid angiography             will need preprocedure hydrations; plan for angio Tuesday                3.  Left-sided pneumonia.              she is on IV Levaquin.  4.  Chronic renal insufficiency.                avoid nephrotoxins.               monitor renal function  will need bicarb before angiogram   7.  Hypertension.               blood pressure is elevated, will allow permissive hypertension.              continue hypertension as needed   8.  Obstructive sleep apnea.               continue CPAP at bedtime. 9.  Chronic history of seizures.               contnue home meds   Plan level 4 consult   Electronic Signatures: Hortencia Pilar (MD)  (Signed 14-Dec-14 12:36)  Authored: General Aspect/Present Illness, Home Medications, Allergies, History and Physical Exam, Vital Signs, Labs, Radiology, Impression/Plan   Last Updated: 14-Dec-14 12:36 by Hortencia Pilar (MD)

## 2014-11-23 NOTE — Discharge Summary (Signed)
PATIENT NAME:  Kristina Wilson, Kristina Wilson MR#:  [id number] DATE OF BIRTH:  1922-08-31  DATE OF ADMISSION:  07/19/2012 DATE OF DISCHARGE:  07/21/2012  ADMITTING DIAGNOSIS:  1. Pneumonia.  2. Acute renal failure.  3. Hypertension.  4. Urinary tract infection.  5. Hyponatremia.  6. History of seizures.   DISCHARGE DIAGNOSES: 1. Bacterial pneumonia of unclear etiology at this time.  2. Acute on chronic renal failure, dehydration-related, resolved on intravenous fluids.  3. Hypercalcemia possibly related to hydrochlorothiazide, intact parathyroid hormone is normal, rule out hyperparathyroidism.  4. Malignant hypertension.  5. Generalized weakness.  6. History of seizure disorder.  7. Obesity.  8. Obstructive sleep apnea.  9. Deep venous thrombosis, not on anticoagulation.  10. History of hemorrhagic cerebrovascular accident in the past.  11. Degenerative joint disease.  12. Chronic back pain.  13. History of radiculopathy.  14. Chronic kidney disease.   DISCHARGE CONDITION: Stable.   DISCHARGE MEDICATIONS: The patient is to resume her outpatient medications which are: 1. Clonidine 0.1 mg p.o. twice daily.  2. Aspirin 81 mg p.o. daily.  3. Skelaxin 800 mg p.o. twice daily.  4. Diltiazem 240 mg p.o. twice daily.  5. Phenytoin 200 mg p.o. at bedtime.  6. Tylenol 650 mg p.o. every 4 hours as needed.   New medications: 1. Senna 8.6 mg p.o. once daily at bedtime.  2. Azithromycin 250 mg p.o. daily for 3 more days.  3. Pyridium 100 mg 3 times daily after meals for 3 more days.  4. Hydralazine 25 mg p.o. 4 times daily, to be advanced as needed for blood pressure measurements.   NOTE: The patient is not to take potassium, enalapril, hydrochlorothiazide unless recommended by primary care physician.   DIET: 2 grams salt, low fat, low cholesterol, regular consistency.   ACTIVITY LIMITATIONS: As tolerated.   REFERRALS: Physical therapy 2 to 7 times a week.   FOLLOW-UP APPOINTMENT:  Dr. Arlana Pouch  in 2 days after discharge.    CONSULTANTS: Care management, physical therapy.   LABORATORY, DIAGNOSTIC AND RADIOLOGICAL DATA:  Chest x-ray, PA and lateral, on 07/17/2012 revealed severe degenerative changes of the spine, cardiomegaly with hyperinflation. No acute cardiopulmonary disease noted. Chest x-ray, PA and lateral, 07/19/2012 revealed mildly increased  prominence of pulmonary interstitium which may reflect interstitial edema of cardiac or noncardiac cause. No  alveolar pneumonia is demonstrated. Follow-up films following therapy are recommended to assure clearance. EKG showed sinus rhythm at 91 beats per minute, first-degree AV block, otherwise normal EKG. No acute ST-T changes were noted. Chest x-ray showed possible pneumonia versus congestive heart failure.   On 07/18/2012, glucose 130, BUN and creatinine were 31 and 1.78, sodium 130, otherwise BMP was unremarkable. Calcium level was high at 11.3. The patient's white cell count was slightly elevated at 12.8, hemoglobin was 11.7, platelet count 267, absolute neutrophil count was 7.1. Blood cultures taken on 07/18/2012 showed no growth. Urinalysis was remarkable for clear, straw colored urine,  negative for glucose, bilirubin, or ketones. Specific gravity was 1.008, pH was 6.0, negative for blood, 30 mg/dL protein, negative for nitrites, trace leukocyte esterase, less than 1 red blood cell, and 4 white blood cells were noted. No bacteria were seen, and less than 1 epithelial cell was noted.   HISTORY AND PHYSICAL: The patient is an 81 year old Caucasian female with past medical history significant for history of hypertension who presented to the hospital with complaints of weakness, cough, as well as uncontrolled blood pressure. Please refer to Dr. Teena Irani admission note  on 07/19/2012.   On arrival to the hospital, the patient's temperature was 98.3, pulse was 80, respiration rate was 18, blood pressure 188/69. Saturation was 96% on room air.  Physical exam revealed a few rales in the left lung but good air entrance bilaterally. Otherwise physical exam was unremarkable.   HOSPITAL COURSE: The patient was admitted to the hospital. She was started on Rocephin as well as Zithromax , and with this therapy her condition somewhat improved. Attempt was also made to control her blood pressure a little bit better. Because of her acute on chronic renal failure, her ACE inhibitor as well as HCTZ were placed on hold. The patient was given low-rate IV fluids and her kidney function improved. Her sodium level, which was low on the day of admission at 130, improved to 135 on the day of discharge, 07/21/2012.   1. In regards to bacterial pneumonia, the patient was having some cough on arrival to the hospital, however, no sputum was obtained. We were not able to figure out what pneumonia she had, however, she responded well to antibiotics IV. She is to continue antibiotics for 3 more days to complete a Zithromax course.  2. Increase in regards to acute on chronic renal failure, it was felt to be somewhat dehydration as well as possibly ACE inhibitor-related. The patient was given low-rate IV fluids. With this, the patient's kidney function improved.  3. The patient was noted to also have hypercalcemia with calcium levels as high as 11.3 on the day of admission, 07/18/2012. With IV fluid administration, however, the patient's calcium level improved to 10.4. It is recommended to follow the patient's calcium levels as outpatient and make decisions about evaluating for possible hyperthyroidism. We checked the patient's intact PTH on 07/20/2012, which was found to be normal at 51.0. In view of this inadequate normal intact PTH level, it is concerning that the patient may have hyperparathyroidism. The patient's vitamin D levels were checked, and vitamin D 25-hydroxy level was low at 30.0. It was felt that the patient's high calcium level could have been also related to  HCTZ, so HCTZ was completely stopped. The patient's calcium level somewhat improved to 10.4 on day of discharge, 07/21/2012. As mentioned above, it is recommended to very closely follow the patient's calcium levels as well as the patient's kidney function tests and make decisions about evaluating her for possible hyperparathyroidism. The patient's TSH was checked and was found to be normal at 1.44.  4. Regarding malignant hypertension, the patient's blood pressure was found to be very elevated. The patient's blood pressure medications were advanced. The patient is to continue diltiazem, and she had added hydralazine. As mentioned above, the patient's ACE inhibitor, Enalapril,  was placed on hold as well as HCTZ.  It is recommended to advance the patient's blood pressure medications to control her blood pressure. On the day of discharge, the patient's blood pressure is somewhat improved from the day of admission. At that time it was 178 systolic blood pressure; however, on the day of discharge it was 153/70's to 80s. Oxygen saturation is 96 to 98% on room air at rest. It is, however, recommended to advance the patient's hydralazine doses to even higher doses and advance the patient's diltiazem if needed to control her heart rate as well as her blood pressure readings as my concern is that the patient's shortness of breath as well as cough could have been cardiac as above in nature because of mild congestive heart failure due to  elevated blood pressure.  5. In regards to generalized weakness, the patient was to continue physical therapy in the facility. She was evaluated here by a physical therapist, and it was felt that she would benefit from physical therapy in the facility or at home. She would prefer a facility. She is going to be discharged to [facility] today on 07/21/2012.  6. In regards to seizure disorder, obstructive sleep apnea, the patient is to continue her outpatient medications. No further  recommendations were made.   The patient is being discharged in stable condition with the above-mentioned medications and followup.   TIME SPENT: 40 minutes.   ____________________________ Katharina Caper, MD rv:cb D: 07/21/2012 15:26:11 ET T: 07/21/2012 16:26:33 ET JOB#: [id number]  cc: Katharina Caper, MD, <Dictator> Jillene Bucks. Arlana Pouch, MD Katharina Caper MD ELECTRONICALLY SIGNED 08/14/2012 13:32

## 2014-11-23 NOTE — Discharge Summary (Signed)
PATIENT NAME:  Kristina Wilson, [name] MR#:  [id number] DATE OF BIRTH:  [birth year]  DATE OF ADMISSION:  07/19/2012 DATE OF DISCHARGE:  07/22/2012  ADDENDUM  DISCHARGE MEDICATIONS: 1. Clonidine 0.1 mg twice daily.  2. Aspirin 81 mg p.o. daily.  3. Phenytoin 200 mg p.o. at bedtime.  4. Acetaminophen 650 mg every 4 hours as needed.  5. Senna 8.6 mg p.o. at bedtime.  6. Azithromycin 250 mg p.o. daily for 3 more days.  7. Phenazopyridine 100 mg p.o. 3 times daily for 3 days.  8. Skelaxin 800 mg p.o. 3 times daily. 9. Diltiazem 240 mg once a day.  10. Coreg 3.125 mg p.o. at bedtime.  11. Minitran 0.6 mg transdermal film one patch once daily.  12. Hydralazine 50 mg p.o. 3 times daily.   NOTE: The patient is not to take potassium, enalapril, Levaquin or hydrochlorothiazide.   DIET: 2 gram salt, low fat, low cholesterol, regular consistency. The patient was advised to continue Ensure 240 mL 3 times daily between meals.   ACTIVITY LIMITATIONS: As tolerated.            REFERRALS: Physical therapy 2 to 7 times a week.   DISCHARGE FOLLOWUP: Follow up appointment with Dr. Arlana Pouchate in 2 days after discharge.  ____________________________ Katharina Caperima Morris Longenecker, MD rv:sb D: 07/22/2012 14:14:07 ET T: 07/22/2012 14:47:51 ET JOB#: [id number]  cc: Katharina Caperima Dennard Vezina, MD, <Dictator> [name] [name], MD Katharina CaperIMA Anah Billard MD ELECTRONICALLY SIGNED 08/03/2012 20:37

## 2014-11-23 NOTE — Discharge Summary (Signed)
ADDENDUM  PATIENT NAME:  NYKIAH, Kristina Wilson MR#:  [id number] DATE OF BIRTH:  02/18/1923  DATE OF ADMISSION:  07/19/2012 DATE OF DISCHARGE:    ADDENDUM:  1. The patient was about to be discharged on 07/21/2012; however, it appeared that she did not spend the 3 required nights in the hospital for placement in a nursing facility. She was reassessed today on 07/22/2012 and was felt to be ready to go to rehabilitation. She was complaining of feeling poorly, weak, and having poor appetite, and she requested some Ensure at the rehabilitation. She apparently refused the Lasix dose yesterday which was given today at around 9:00 a.m. after which the patient had some diuresis. She is on room air now and she has good oxygen saturations. Blood pressure, however, remains somewhat high and hydralazine is being advanced as well as nitroglycerin topically was added.  1. Regarding bacterial pneumonia, it was felt that patient should continue antibiotic therapy with Azithromycin. The patient's Rocephin was discontinued. The patient is to continue therapy for the next 3 days. Blood cultures are negative and sputum cultures were unfortunately not obtained. It was felt the patient's pneumonia was very likely atypical. Today, on 07/22/2012, patient's lung auscultation is much improved. She does not have crackles which she had yesterday which were diffuse and bilateral.  2. Because of the patient's chest x-ray done on 07/22/2012 revealed low-grade decompensated CHF, the patient was given Lasix. She, however, refused the Lasix on 07/21/2012 stating that she would need to go to the bathroom frequently at nighttime, so Lasix was given today on 07/22/2012. The patient's CHF was very likely due to fluid retention due to chronic renal insufficiency and was very likely acute diastolic; however, the patient did not get echocardiogram during this admission. She is to be scheduled for echocardiogram in near future as outpatient. She may  need to have Lasix as outpatient depending on her kidney function as well as her fluid retention. It was felt that the patient's fluid retention was very likely related to kidney insufficiency, as well as possibly malignant hypertension. The patient's blood pressure was still poorly controlled. We advanced the patient's hydralazine, as well as added nitroglycerin topically. The patient is to have her blood pressure readings checked frequently and make decisions about advancement blood pressure medications even higher.  3. Acute on chronic renal failure. The patient's acute on chronic renal failure resolved initially with fluids; however, the patient was given Lasix again on 07/22/2012. It is recommended to follow the patient's kidney function as outpatient and make decisions about her medications if needed. The patient's creatinine on the day of discharge, 07/22/2012 is 1.55 up from 1.47 on 07/21/2012. Of note, the patient's kidney function could be also deteriorating because of her hypercalcemia. It is recommended to follow the patient's calcium levels and make decisions about endocrinology consultation as outpatient for evaluation for possible hyperparathyroidism. 4. For generalized weakness, the patient was seen by PT. PT recommended home physical therapy; however, the patient deferred rehabilitation. She is to be discharged to [facility] today on 07/22/2012. Bed is available there and social workers are working to discharge her. 5. Regarding uncontrolled hypertension, malignant hypertension. The patient's blood pressure improved on hydralazine as well as Imdur. The patient is to continue her clonidine as well as diltiazem and Coreg she took at home. It is recommended to follow the patient's blood pressure readings as outpatient and advance medications as needed. It is recommended also to get echocardiogram as outpatient the next few days  after discharge and make decisions about changing her medications  depending on results. We are holding the patient's ACE inhibitor due to her renal insufficiency; however, the patient may benefit from ACE inhibitor overall if it is felt that patient has significant congestive heart failure or cardiomyopathy.  6. In regards to dysuria, the patient is to continue Pyridium. The patient's dysuria seems to be subsiding.  7. For seizure disorder, the patient is to continue Dilantin.  8. For deconditioning as above, the patient is to go to rehabilitation facility today on 07/22/2012. On day of discharge, the patient's vital signs are stable. Her temperature is 97.2, pulse is 106, respirations were 20, blood pressure 139/73, saturation was 95% on room air at rest.   TIME SPENT: 40 minutes.     ____________________________ Kristina Caperima Rooney Swails, MD rv:es D: 07/22/2012 14:12:31 ET T: 07/22/2012 14:44:37 ET JOB#: [id number]  cc: Kristina Caperima Kaleesi Guyton, MD, <Dictator> Miabella Shannahan MD ELECTRONICALLY SIGNED 08/14/2012 13:39

## 2014-11-23 NOTE — H&P (Signed)
PATIENT NAME:  Kristina Wilson, Kristina Wilson MR#:  [id number] DATE OF BIRTH:  Apr 24, 1923  DATE OF ADMISSION:  07/15/2013  PRIMARY CARE PHYSICIAN: Dr. Arlana Pouchate.   CHIEF COMPLAINT: Mumbling speech, unable to follow commands.   HISTORY OF PRESENT ILLNESS: The patient is an 81 year old, obese Caucasian female with past medical of hypertension, seizure disorder, history of hemorrhagic stroke in year 1974, sleep apnea, chronic kidney disease, remote history of deep venous thrombosis, not on any anticoagulation, is presenting to the ER with a chief complaint of garbled speech and unable to follow verbal commands for approximately four minutes. Patient's daughter is reporting that they both were watching TV, and at around 9:30 p.m., the patient started mumbling, with garbled speech. At that time, she was unable to follow any verbal commands and felt dizzy. Denies any loss of consciousness. No facial droop. Patient became lethargic, according to the daughter.   This episode lasted for approximately 4 to 5 minutes and the patient spontaneously recovered to her baseline. Thirty minutes prior to this event, the patient had epistaxis, and her systolic blood pressure was at around 200s at that time. Her daughter is reporting that lately her baseline blood pressure is running at around 170 systolic. The patient's vision was foggy during that episode, and she felt weak in her extremities. She has an old history of stroke and some left-sided weakness, which is chronic in nature. Denies any chest pain or shortness of breath at that time. Denies any nausea or vomiting.   The patient's CAT scan of the head in the ER revealed no acute findings. The patient is back to her baseline, answering all questions appropriately during my examination. Denies any headache, blurry vision, double vision. Denies any difficulty in swallowing, mumbling of speech anymore. Denies any abdominal pain. Denies any weakness in her extremities.   PAST MEDICAL  HISTORY: Obstructive sleep apnea, uses CPAP at bedtime, chronic kidney disease, stage 4, history of radiculopathy, degenerative joint disease, remote history of deep venous thrombosis, not on any anticoagulation other than aspirin, hemorrhagic cerebrovascular accident in 1974, with residual left-sided weakness, seizure disorder, hypertension.   PAST SURGICAL HISTORY: Bilateral knee replacement, appendectomy.  ALLERGIES: CODEINE, HYDROCODONE, OXYCODONE, TRAMADOL, AND VICODIN.  PSYCHOSOCIAL HISTORY: Lives at home with daughter. Daughter takes care of her. Denies any history of smoking, alcohol or illicit drug usage.   FAMILY HISTORY: Significant for coronary artery disease.   HOME MEDICATIONS: Skelaxin 800 mg 2 times a day, phenytoin 100 mg 2 capsules once daily, hydralazine 50 mg 3 times a day, iron sulfate  3 times a day, enalapril 20 mg 2 times a day, diltiazem 240 mg 2 times a day, clonidine 0.1 mg 1 tablet p.o. q.12 hours, Coreg 3.125 mg once daily, aspirin 81 mg once a day.   REVIEW OF SYSTEMS:  CONSTITUTIONAL: Denies any fever or fatigue.  EYES: Denies blurry vision or double vision during my examination. Had blurry vision during the episode that has completely resolved.  ENT: The patient had one episode of epistaxis 30 minutes prior to this episode, but denies any discharge, snoring. RESPIRATORY:  Has obstructive sleep apnea. Denies any chronic obstructive pulmonary disease.  CARDIOVASCULAR: Denies any chest pain, palpitation. GASTROINTESTINAL: Denies nausea, vomiting, diarrhea, abdominal pain.  GENITOURINARY: No dysuria or hematuria.  GYNECOLOGIC AND BREASTS: Denies breast mass or vaginal discharge.  ENDOCRINE: Denies polyuria, nocturia. Denies any history of diabetes mellitus. HEMATOLOGIC AND LYMPHATIC:  Denies anemia, easy bruising, bleeding.  INTEGUMENT:  No acne, rash, lesions.  MUSCULOSKELETAL: Denies  any gout. Denies any swelling. NEUROLOGIC: Has old history of hemorrhagic  stroke in 1974. Denies ataxia.  PSYCHIATRIC: No ADD, OCD.   PHYSICAL EXAMINATION: VITAL SIGNS: Temperature 97.8, pulse 72, respirations 20, blood pressure 173/54, pulse oximetry 98%.  GENERAL APPEARANCE: Not in acute distress. Moderately built and obese.  HEENT: Normocephalic, atraumatic. Pupils are equally reacting to light and accommodation. No scleral icterus. No conjunctival injection. No sinus tenderness. Extraocular movements are intact. No sinus tenderness. Moist mucous membranes. NECK: Supple. No JVD. No thyromegaly. Range of motion is intact.  LUNGS: Clear to auscultation bilaterally. No accessory muscle use. No tenderness on palpation.  CARDIAC: S1, S2 normal. Regular rate and rhythm. Positive murmur.  GASTROINTESTINAL: Soft, obese. Bowel sounds are positive in all four quadrants. Nontender, nondistended. No hepatosplenomegaly. No masses felt.  NEUROLOGIC: Awake, alert, oriented x3. Speech is intact. Cranial nerves II through XII are grossly intact. Motor and sensory are intact. Reflexes are 2+. No cerebellar signs. No pronator drift.  EXTREMITIES: No edema. No cyanosis. No clubbing. She has chronic lower extremity tenderness which is not new.  SKIN: Warm to touch. Normal turgor. No rashes. No lesions.  MUSCULOSKELETAL: No joint effusion, tenderness, erythema.  PSYCHIATRIC: Normal mood and affect.   LABORATORY AND IMAGING STUDIES: A 12-lead EKG has revealed sinus tachycardia at 85 beats per minute. No acute ST-T wave changes. PT 12.5, INR 1.9. Urinalysis yellow in color, pH 6.0. Nitrites and leukocyte esterase are negative. CBC: WBC 10.5, hemoglobin 9.0, hematocrit 26.9, platelets 263. CK total 38, CPK-MB less than 0.5, troponin less than 0.02. LFTs are normal. serum iron level is 111, glucose 142, BUN 28, creatinine 2.07, sodium 136, potassium 4.3, chloride 105, CO2 25, GFR 19, anion gap 6, serum osmolality 280, calcium 10.6. Chest x-ray, portable, has revealed mild cardiomegaly, mild  left lung opacity, somewhat more prominent than on prior studies, and may reflect atelectasis or possible mild pneumonia. CAT scan of the head without contrast has revealed no acute intracranial abnormality.   ASSESSMENT AND PLAN: An 81 year old Caucasian female presenting to the ER with chief complaint of garbled speech, unable to follow verbal commands at five minutes, approximately, around 9:30 p.m. last night, will be admitted with following assessment and plan.  1.  Possible transient ischemic attack. Symptoms have resolved. Will admit her to telemetry for close observation.  2.  Will give her given aspirin and statin.  3.  Carotid Doppler as well as 2-D echocardiogram will be obtained.  4.  Physical therapy consult for weakness.  5.  Left-sided pneumonia. Will give her IV Levaquin.  6.  Chronic renal insufficiency. Will avoid nephrotoxins. Monitor renal function closely.  7.  Hypertension. Blood pressure is elevated, raising concern for transient ischemic attack versus cerebrovascular accident. Will allow permissive hypertension. We will resume her home medications.  8.  Obstructive sleep apnea. Continue CPAP at bedtime. 9.  Chronic history of seizures. We will continue her home medication. Phenytoin will be continued.  10.  Will provide her GI and deep vein thrombosis prophylaxis with Protonix and heparin subcutaneous.   She is full code. Daughter is the medical power of attorney.   Diagnosis and plan of care were discussed in detail with the patient and her daughter at bedside. They both verbalized understanding of the plan.   TOTAL TIME SPENT ON THE ADMISSION: 45 minutes.     ____________________________ Ramonita Lab, MD ag:cg D: 07/15/2013 03:51:38 ET T: 07/15/2013 04:16:58 ET JOB#: [id number]  cc: Ramonita Lab, MD, <Dictator> Ramonita Lab  MD ELECTRONICALLY SIGNED 07/18/2013 0:09

## 2014-11-24 NOTE — H&P (Signed)
PATIENT NAME:  Kristina Kristina Wilson, Kristina Wilson MR#:  [id number] DATE OF BIRTH:  [birth year]  DATE OF ADMISSION:  12/08/2013  PRIMARY CARE PHYSICIAN: Dr. Dewaine Oats. The patient currently is at [facility]. Physician is Dr. Einar Crow.  CHIEF COMPLAINT: Swelling of the tongue and shortness of breath.   HISTORY OF PRESENT ILLNESS: Kristina Kristina Wilson Kristina Wilson is an 81 year old Caucasian female with history of obstructive sleep apnea, chronic kidney disease, DJD, hypertension, stroke with residual left-sided weakness, comes to the Emergency Room after she started having some tingling and swelling of her tongue this morning. She experienced some shortness of breath, noticed some tingling on her lips as well. She had significant swelling of her tongue. Came to the Emergency Room, received Solu-Medrol, ranitidine, and Benadryl. She is being admitted for overnight observation for angioedema, which likely could be due to her ACE inhibitor. The patient is currently feeling better. She is able to swallow water without any difficulty.   REVIEW OF SYSTEMS:    CONSTITUTIONAL: No fever, fatigue or chills.  EYES: No blurred or double vision or eye pain.  EAR, NOSE, THROAT: No tinnitus, ear pain, hearing loss.  RESPIRATORY: No cough, wheeze, hemoptysis. Mild shortness of breath.  CARDIOVASCULAR: No chest pain, palpitations. Positive for hypertension.  GASTROINTESTINAL: No nausea, vomiting, diarrhea, abdominal pain.  GENITOURINARY: No dysuria, hematuria, frequency.  ENDOCRINE: No polyuria, nocturia or thyroid problems.  HEMATOLOGIC: No anemia or easy bruising.  SKIN: No acne, rash or lesions.  MUSCULOSKELETAL: Positive for back pain and arthritis.  NEUROLOGIC: No CVA, TIA. Positive for left-sided weakness, chronic.  PSYCHIATRIC: No anxiety or depression.   All other systems reviewed and negative.   PAST MEDICAL HISTORY:   1.  Obstructive sleep apnea, on CPAP.  2.  CKD, stage III.  3.  DJD.  4.  History of CVA with residual  left-sided weakness.  5.  Seizure disorder.  6.  History of hypertension.   SOCIAL HISTORY: She is currently at [facility] for rehab. Uses walker for ambulation. Denies any alcohol or drug use. Remote tobacco abuse.   FAMILY HISTORY: Positive for CAD.   ALLERGIES: CAPZASIN, CODEINE, HYDROCODONE, OXYCODONE, TRAMADOL, AS WELL AS VICODIN.   HOME MEDICATIONS: 1.  Aspirin 81 mg daily.  2.  Alprazolam 0.5 mg once a day at bedtime.  3.  Carvedilol 3.125 daily at bedtime.  4.  Clonidine 0.3 mg b.i.d.  5.  Diltiazem 240 p.o. b.i.d.  6.  Docusate 1 capsule every other day.  7.  Enalapril 10 mg b.i.d.  8.  Fentanyl 50 mcg patch every 72 hours.  9.  Ferrous sulfate 325 t.i.d.  10.  Lasix 20 mg every other day.  11.  Gabapentin 100 mg 3 times a day.  12.  Hydralazine 75 mg b.i.d.  13.  Hydralazine 50 mg at bedtime.  14.  Lidocaine patch 5% apply topically to affected area.  15.  Magnesium Plus XS 400 mg suspension, 30 mL every 4 hours as needed.  16.  Metaxalone 800 mg t.i.d.  17.  Remeron 30 mg once a day at bedtime.  18.  Nitroglycerin 0.4 mg sublingual as needed.  19.  Zofran 4 mg every 12 hours.  20.  Protonix 40 mg b.i.d.  21.  Phenytoin 200 mg at bedtime.  22.  PreserVision p.o. daily.   PHYSICAL EXAMINATION: GENERAL: The patient is awake, alert, oriented x3, not in acute distress.  VITAL SIGNS: Afebrile. Pulse is 93. Blood pressure is 187/78. Sats are 98% on 2 liters.  HEENT: Atraumatic,  normocephalic. Pupils: PERRLA. EOM intact. Oral mucosa is moist. The patient does have some minimal tongue swelling left.  NECK: Supple. No JVD. No carotid bruit.  RESPIRATORY: Clear to auscultation bilaterally. Decreased breath sounds at the bases. No respiratory distress or use of accessory muscles.  CARDIOVASCULAR: Both the heart sounds are normal. Rate, rhythm regular. PMI not lateralized. Chest nontender.  EXTREMITIES: Good pedal pulses, good femoral pulses. Trace lower extremity edema.   ABDOMEN: Obese, soft, nontender. No organomegaly. Positive bowel sounds.  NEUROLOGIC: Grossly intact cranial nerves II through XII. No motor or sensory deficit.  PSYCHIATRIC: The patient is awake, alert, oriented x 3.   LABORATORY AND RADIOLOGICAL DATA: CBC: White count is normal; hemoglobin and hematocrit are 9.8 and 29.1; platelet count is 274. Glucose is 99; BUN is 46; creatinine is 2.62; sodium is 134; potassium is 5.2; chloride is 104. Chest x-ray: No acute persistent left base pneumonia. Urinalysis: Negative for UTI.   ASSESSMENT AND PLAN: An 81 year old Kristina Kristina Wilson Kristina Wilson with multiple medical problems,  comes into the Emergency Room with:  1.  Angioedema: The patient presented with tingling on the lips and swelling of her tongue with shortness of breath and came to the Emergency Room. SHE IS BEING ADMITTED FOR ANGIOEDEMA, SUSPECTED DUE TO HER ENALAPRIL. She does not have any other food allergies. The patient will be admitted for overnight observation. She is able to tolerate liquids. Will start her on some soft diet and watch for dysphagia. She will be given Solu-Medrol, ranitidine, and Benadryl.  2.  Hypertension: Will continue her home medications, which are Coreg, hydralazine, and clonidine. Enalapril will be taken off from the patient's medication list.  3.  Morbid obesity.  4.  Gastroesophageal reflux disease: On PPI.  5.  Obstructive sleep apnea: The patient is on CPAP. She will try to use her machine if daughter can bring it. 6.  Seizure disorder, chronic: Continue Dilantin.  7.  Deep vein thrombosis prophylaxis: Will give her subcutaneous heparin.  8.  Further work-up according to the patient's clinical course. Hospital admission plan was discussed with the patient. If she feels better, [facility].   TIME SPENT: 45 minutes.    ____________________________ Wylie HailSona A. Allena KatzPatel, MD sap:jcm D: 12/08/2013 13:29:08 ET T: 12/08/2013 15:01:16  ET JOB#: [id number]  cc: Shan Valdes A. Allena KatzPatel, MD, <Dictator> Willow OraSONA A Renezmae Canlas MD ELECTRONICALLY SIGNED 12/24/2013 16:17

## 2014-11-24 NOTE — Discharge Summary (Signed)
PATIENT NAME:  Kristina Wilson, [name] MR#:  [id number] DATE OF BIRTH:  16-Sep-1922  DATE OF ADMISSION:  08/14/2013 DATE OF DISCHARGE:  08/17/2013  ADMITTING PHYSICIAN: Susa GriffinsPadmaja Vasireddy, MD  DISCHARGING PHYSICIAN: Enid Baasadhika Delailah Spieth, MD  PRIMARY CARE PHYSICIAN: Dr. Dewaine Oatsenny Tate.  CONSULTATIONS IN THE HOSPITAL: None.   DISCHARGE DIAGNOSES: 1.  Chronic debilitated state with generalized weakness.  2.  Chronic congestive heart failure with diastolic dysfunction, well compensated.  3.  History of transient ischemic attack.  4.  Left carotid artery stenosis.  5.  Chronic low back pain and right hip pain. 6.  Chronic kidney disease stage III.  7.  Hypertension.  8.  History of seizures.  9.  Mild hypercalcemia.  DISCHARGE HOME MEDICATIONS: 1.  Phenytoin 200 mg p.o. at bedtime.  2.  Ferrous sulfate 325 mg p.o. 3 times a day.  3.  Cardizem 240 mg p.o. b.i.d.  4.  Enalapril 20 mg p.o. b.i.d.  5.  Aspirin 325 mg p.o. daily.  6.  Hydralazine 50 mg p.o. 4 times a day.  7.  Coreg 3.125 mg p.o. at bedtime.  8.  Colace 100 mg p.o. at bedtime.  9.  Zofran 4 mg p.o. b.i.d. p.r.n. for nausea and vomiting.  10.  Clonidine 0.1 mg p.o. b.i.d. 11.  Xanax 0.5 mg t.i.d. p.r.n. for anxiety.  12.  Lidocaine 5% film topically applied to right hip once a day.  13.  Skelaxin 800 mg p.o. 3 times a day.  14.  Prednisone taper for 5 more days.  DISCHARGE DIET: Low-sodium diet.   DISCHARGE ACTIVITY: As tolerated.   FOLLOWUP INSTRUCTIONS: 1.  PCP followup in 1 week.  2.  Physical therapy. 3.  [facility] 1 to 2 weeks.   LABORATORY AND IMAGING STUDIES PRIOR TO DISCHARGE: WBC 11, hemoglobin 8.6, hematocrit 25.2, platelet count 328.   Sodium 130, potassium 4.3, chloride 99, bicarb 28, BUN 34, creatinine 2.1, glucose 124, calcium 10.3. TSH 2.28.  CT of the head without contrast showing no acute interval changes. Chest x-ray on admission showing clear lung fields. No  cardiopulmonary disease. Urinalysis negative for any infection. Serum Dilantin level was on the lower side.   BRIEF HOSPITAL COURSE: Kristina Wilson is an 81 year old Caucasian female with past medical history significant for hypertension, CKD stage III, history of hemorrhagic CVA with residual left-sided weakness in the past, seizure disorder, left carotid artery stenosis with recent admission to the hospital for TIA. Was in the process of getting outpatient work-up for her carotid artery stenosis and was brought into the hospital secondary to worsening shortness of breath and also generalized weakness.  1.  Shortness of breath. The patient was short of breath on admission. She was not hypoxic. It was probably from her chronic debilitated state and poor functional capacity. Initially was started on IV Lasix for possible CHF exacerbation. However, her chest x-ray was clear. BNP was only barely elevated at 440. Her blood pressure was dropping and she was symptomatic with orthostatic hypotension so Lasix was discontinued. The patient has been working with physical therapy who have recommended that she could benefit from going to short-term rehab.  2.  History of TIA with left carotid artery stenosis. She is following up with vascular as an outpatient. It is up to 60 to 70% blocked on the ultrasound and the patient recently had cardiac and pulmonary critical events and will need to follow up with vascular for outpatient procedure. 3.  CKD stage  IV. Seems to be at baseline, creatinine of 2.1. She is stable.  4.  Seizure disorder. The patient is on Dilantin, which we will continue.  5.  Obstructive sleep apnea. She is on CPAP. 6.  Hypertension. The patient is on a lot of medications for her blood pressure. All her medications are being continued, except the clonidine is reduced from 3 to 2 times a day. Other than that, she is also on Cardizem, Coreg, enalapril, and hydralazine.  7.  Chronic right hip pain. The  patient has low back pain and back surgery done in the past and since then she has right hip pain that is radiating down the lateral side of her thigh. It is not limiting her hip motion. She is referred to see a physiatrist to see if she would be a candidate for steroid injections.  She is being discharged on a steroid taper for her radicular symptoms.   Her course has been otherwise uneventful in the hospital.   DISCHARGE CONDITION: Stable.   DISCHARGE DISPOSITION: To short-term rehab.   TIME SPENT ON DISCHARGE: 45 minutes. ____________________________ Enid Baas, MD rk:sb D: 08/17/2013 12:14:30 ET T: 08/17/2013 13:24:48 ET JOB#: [id number]  cc: Enid Baas, MD, <Dictator> Jillene Bucks. Arlana Pouch, MD Enid Baas MD ELECTRONICALLY SIGNED 08/17/2013 14:17

## 2014-11-24 NOTE — Consult Note (Signed)
PATIENT NAME:  Kristina Wilson, Kristina Wilson DATE OF BIRTH:  Jul 14, 1923  DATE OF CONSULTATION:  09/15/2013  REQUESTING PHYSICIAN:  Dr. Gilda CreaseSchnier.   CONSULTING PHYSICIAN:  Kristina Wilson, Kristina Wilson  PRIMARY CARE PHYSICIAN:  Dr. Dewaine Oatsenny Tate, M.D.   REASON FOR CONSULTATION:  Postoperative management after carotid endarterectomy with malignant hypertension.   HISTORY OF PRESENT ILLNESS:  This is an 81 year old female who underwent a carotid endarterectomy on the left side by Dr. Gilda CreaseSchnier on 09/15/2013. This was elective for symptomatic carotid stenosis. The patient was seen after she came out of the Operating Room and the PACU. She was able to move all extremities, difficult to talk at this point with coming out of anesthesia and a procedure on her neck but she was able to answer yes or no questions. Preop, the patient did have a transfusion of 2 units the day prior to the surgery and hemoglobin today was 9.8. Her blood pressure preop was systolic blood pressure 220. The patient did take her usual medications prior to surgery. The patient is in 9/10 pain in her left neck at this point. Looking back on recent discharge, blood pressure at that time was 163/73. In the PACU her blood pressure was 200/100 on A-line and also cuff, and hospitalist services were contacted for further evaluation.   PAST MEDICAL HISTORY:  1.  Obstructive sleep apnea on CPAP.  2.  Chronic kidney disease, stage IV.  3.  Degenerative joint disease.  4.  A remote history of DVT.  5.  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  6.  Seizure disorder.  7.  Hypertension.  8.  Left carotid stenosis status post carotid endarterectomy.  9.  Anemia. 10.  A history of diastolic congestive heart failure.   PAST SURGICAL HISTORY: 1.  Bilateral knee surgery.  2.  Appendectomy. 3.  Lumpectomy.  4.  Cataract surgery.  5.  Back surgery.  6.  D and C.  7.  Tonsillectomy. 8.  Joint replacement. 9.  Left carotid  endarterectomy, today, 09/15/2013.  ALLERGIES: CODEINE, HYDROCODONE, OXYCODONE, TRAMADOL and VICODIN. She states the PAIN MEDICATIONS CAUSE HER TO HAVE DIFFICULTY BREATHING.   CURRENT MEDICATIONS ORDERED:  1.  Normal saline at 50 mL an hour.  2.  Esmolol drip at 25 mcg/kg/minute.  3.  Ceftezole 2 grams IV preop.  4.  Xanax 0.5 mg orally q.12 hours.  5.  Mag/aluminum Plus 30 mL q.4 hours p.r.n., indigestion.  6.  Coreg 3.125 mg daily. 7.  Cefazolin 1 gram IV piggyback q.8 hours. Stop after 3 doses.  8.  Clonidine 0.2 mg b.i.d. 9.  Cardizem ER 240 mg b.i.d. 10.  Colace 100 mg b.i.d. 11.  Enalapril 10 mg b.i.d. 12.  Fentanyl 12.5 to 25 mcg IV push every 5 to 10 minutes p.r.n. pain while in the PACU. 13.  Ferrous sulfate 325 mg t.i.d.  14.  Hydralazine 50 mg q.i.d. 15.  Labetalol 5 to 10 mg IV push q. 30 minutes.  16.  Skelaxin 800 mg q.8.  17.  Morphine 2 mg IV push q.1 hour p.r.n. pain.  18.  Zofran 4 mg IV q.2 to 4 hours p.r.n., nausea, vomiting.  19.  Dilantin 200 mg at bedtime.  20.  Aspirin 81 mg daily.   SOCIAL HISTORY:  Currently at rehab. Former smoker. No current smoking, drinking or drug use.   FAMILY HISTORY:  As per old chart, significant for coronary artery disease.   REVIEW OF SYSTEMS:  Difficult to obtain secondary to just coming out of PACU. CONSTITUTIONAL:  Positive for generalized weakness, no fever.  EYES:  No change in vision. EARS, NOSE, MOUTH AND THROAT:  Positive for sore throat and neck pain.  RESPIRATORY:  No shortness of breath.  CARDIOVASCULAR:  No chest pain.  GASTROINTESTINAL:  No nausea. No vomiting. No abdominal pain, no diarrhea or constipation.  GENITOURINARY:  No burning on urination. HEMATOLOGIC:  Positive for anemia.  MUSCULOSKELETAL:  Positive for arthritis pain.  NEUROLOGIC:  No fainting or blackouts.  PSYCHIATRIC:  No anxiety, depression.  ENDOCRINE:  No thyroid problems.   PHYSICAL EXAMINATION:  VITAL SIGNS:  Blood pressure 200/100,  pulse ranging between high 60s and low 70s, respirations 18, temperature afebrile.  GENERAL:  No respiratory distress, lying flat in bed.  EYES:  Conjunctivae and lids normal. Pupils equal, round and reactive to light.  EARS, NOSE, MOUTH AND THROAT:  Nasal mucosa:  No erythema. Throat:  No erythema. No exudate seen. Lips and gums:  No lesions.  NECK:  Status post left carotid endarterectomy. No bleeding from surgical site.  CARDIOVASCULAR:  S1, S2 normal. A 2/6 systolic ejection murmur. Carotid upstroke 2+ bilaterally. No bruits.  EXTREMITIES:  Dorsalis pedis pulses 2+ bilaterally. All pulses equal, upper and lower extremity. Did not test the right upper extremity secondary to A-line.  ABDOMEN:  Soft, no organosplenomegaly, normoactive bowel sounds. No masses felt.  LYMPHATIC:  No lymph nodes on the right side of the neck. Did not test left side of the neck secondary to recent surgery  NEUROLOGIC:  Moving all extremities on her own.  SKIN:  Surgical site on the neck clean and dry. No other rashes or lesions seen anywhere else.  PSYCHIATRIC:  Alert, oriented to person and place.   LABORATORY AND RADIOLOGICAL DATA:  Hemoglobin 9.8, hematocrit 28.0, potassium 3.6, glucose 113.     ASSESSMENT AND PLAN:  1.  Malignant hypertension:  I think the pain being 9/10 can be contributing. With better pain control, the blood pressure will come down. Esmolol drip was ordered by Dr. Gilda Crease until the patient is able to take her usual oral medications. Current blood pressure up at 200/100. Looking back at last hospital discharge, blood pressure was 163/73 at that time. I think the patient normally has very high blood pressure. The, goal blood pressure probably around 160 at this point. The patient also has p.r.n. IV labetalol. 2.  A history of congestive heart failure. No signs at this time, need to be careful with the fluids.  3.  Sleep apnea. Wears a CPAP at night, may be difficult to do at this before with  surgery on her neck.  4.  Chronic kidney disease, stage IV. We will check a BMP in the a.m.  5.  Seizure disorder, on Dilantin.  6.  A history of cerebrovascular accident in the past. Aspirin will be restarted.  7.  Status post left carotid endarterectomy today by Dr. Gilda Crease. Surgical follow up and pain control. 8.  A history of anemia, was transfused preop. We will check a hemoglobin in the a.m.   TIME SPENT ON CONSULTATION:  50 minutes.   ____________________________ Kristina Dimes. Renae Gloss, Kristina Wilson rjw:jm D: 09/15/2013 11:06:52 ET T: 09/15/2013 11:29:29 ET JOB#: [id number]  cc: Kristina Dimes. Renae Gloss, Kristina Wilson, <Dictator> Jillene Bucks. Arlana Pouch, Kristina Wilson [name], Kristina Wilson Salley Scarlet Kristina Wilson ELECTRONICALLY SIGNED 09/23/2013 14:06

## 2014-11-24 NOTE — Consult Note (Signed)
Chief Complaint:  Subjective/Chief Complaint Back and leg pain some better today with oral steroid.   Still needs corst and Physical Therapy to ambulate.  Will need rehabilitation facility.   VITAL SIGNS/ANCILLARY NOTES: **Vital Signs.:   25-Apr-15 14:30  Vital Signs Type Routine  Temperature Temperature (F) 97.8  Celsius 36.5  Temperature Source oral  Pulse Pulse 72  Respirations Respirations 18  Systolic BP Systolic BP 161  Diastolic BP (mmHg) Diastolic BP (mmHg) 62  Mean BP 86  Pulse Ox % Pulse Ox % 96  Pulse Ox Activity Level  At rest  Oxygen Delivery Room Air/ 21 %   Brief Assessment:  Additional Physical Exam straight leg raise negative.  range of motion good lower extremities.  sensation intact.   Lab Results: TDMs:  25-Apr-15 03:20   Dilantin, Serum  8.9 (Result(s) reported on 25 Nov 2013 at 04:28AM.)  Routine Chem:  25-Apr-15 03:20   Glucose, Serum  140  BUN  34  Creatinine (comp)  1.99  Sodium, Serum 136  Potassium, Serum 4.2  Chloride, Serum 104  CO2, Serum 27  Calcium (Total), Serum  10.6  Anion Gap  5  Osmolality (calc) 282  eGFR (African American)  25  eGFR (Non-African American)  22 (eGFR values <71m/min/1.73 m2 may be an indication of chronic kidney disease (CKD). Calculated eGFR is useful in patients with stable renal function. The eGFR calculation will not be reliable in acutely ill patients when serum creatinine is changing rapidly. It is not useful in  patients on dialysis. The eGFR calculation may not be applicable to patients at the low and high extremes of body sizes, pregnant women, and vegetarians.)   Radiology Results: XRay:    23-Apr-15 16:33, Lumbar Spine AP and Lateral  Lumbar Spine AP and Lateral   REASON FOR EXAM:    back pain  COMMENTS:       PROCEDURE: DXR - DXR LUMBAR SPINE AP AND LATERAL  - Nov 23 2013  4:33PM     CLINICAL DATA:  Low back pain today. Instability with shortness of  breath.    EXAM:  LUMBAR SPINE - 2-3  VIEW    COMPARISON:  Abdominal pelvic CT 11/30/2010. Chest radiographs today  and 10/04/2013. Lumbar MRI 10/24/2004.    FINDINGS:  There are 5 lumbar type vertebral bodies. Chronic superior endplate  compression deformity at L1 is stable from the MRI. No acute  fractures are identified. There is multilevel spondylosis associated  with a convex left scoliosis. L4 laminectomy and diffuse facet  disease are noted. There are scattered vascular calcifications.     IMPRESSION:  Diffuse degenerative and postsurgical changes as described. Chronic  L1 compression deformity. No acute osseous findings demonstrated.      Electronically Signed    By: BCamie PatienceM.D.    On: 11/23/2013 16:49     Verified By: WVivia Ewing M.D.,  MRI:    23-Apr-15 22:19, MRI Lumbar Spine Without Contrast  MRI Lumbar Spine Without Contrast   REASON FOR EXAM:    back pain sever can't walk  COMMENTS:       PROCEDURE: MR  - MR LUMBAR SPINE WO CONTRAST  - Nov 23 2013 10:19PM     CLINICAL DATA:  Low back pain radiating down both legs. Worse today.    EXAM:  MRI LUMBAR SPINE WITHOUT CONTRAST    TECHNIQUE:  Multiplanar, multisequence MR imaging was performed. No intravenous  contrast was administered.    COMPARISON:  Prior  radiograph from earlier the same day.  FINDINGS:  For the purposes of this dictation, the lowest well-formed  intervertebral disc spaces presumed to be the L5-S1 level, and there  presumed to be 5 lumbar type vertebral bodies.    Levoscoliosis of the lumbar spine is present with apex at the L2-3  level. Grade 1 anterior listhesis of L4 on L5 is present. Otherwise,  vertebral bodies are normally aligned with preservation of the  normal lumbar lordosis. Signal intensity within the vertebral body  bone marrow is normal. Signal intensity within the visualized spinal  cord is within normal limits. The conus medullaris terminates at the  L1 level.    Chronic compression deformity of  the L1 vertebral body with  associated 60% of height loss present. There is no retropulsion at  this level. Associated degenerative endplate Schmorl's nodes seen at  the central aspect of the superior endplate of L1. No acute fracture  identified.    At T11-12, seen only on sagittal projection, a small right  paracentral disc protrusion is present without significant central  canal stenosis. No significant facet arthropathy appreciated. No  canal or neural foraminal stenosis.    At T12-L1, prominent anterior osteophytes noted. There is diffuse  disc desiccation with mild diffuse disc bulge. No focal disc  protrusion. Moderate bilateral facet arthrosis. No significant canal  or foraminal stenosis.    At L1-2, there is diffuse disc bulging with disc desiccation.  Prominent osteophytic spurring seen anteriorly and along the right  lateral aspect of the L1-2 intervertebral disc space at this level.  Moderate bilateral facet arthrosis with ligamentum flavum  hypertrophy. There are is resultant moderate canal with severe right  foraminal stenosis. Mild left foraminal narrowing present. No  definite focal disc protrusion.    At L2-3, there is diffuse disc bulge with disc desiccation. T2  hyperintense fluid signal intensity seen within the L2-3  intervertebral disc space without endplate erosion to suggest  osteomyelitis/ discitis. Finding likely related to cystic  degeneration. Small annular fissure present posteriorly. There is  fairly severe bilateral facet arthrosis with secondary encroachment  upon the lateral recesses bilaterally and moderate canal stenosis.  Moderate to severe right with mild left foraminal narrowing present.  At L3-4, sequelae of prior decompressive laminectomy seen  posteriorly. Moderate bilateral facet arthrosis present. There is  diffuse disc bulge with disc desiccation without focal disc  protrusion. The hypertrophied facets encroach upon the lateral  aspect  of the lateral recesses bilaterally with resultant mild canal  narrowing. There is severe left with moderate right foraminal  stenosis.    At L4-5, grade 1 anterolisthesis of 4 mm is present. Sequelae of  prior decompressive laminectomy present at this level. Mild canal  stenosis present related to bilateral facet arthrosis. There is a  left foraminal disc protrusion impinging upon the exiting left L4  nerve root as it courses through the left neural foramen, best  appreciated on sagittal T1 weighted sequence (series 4, image 11).  Mild right foraminal stenosis.  At L5-S1, there is diffuse disc desiccation with minimal diffuse  disc bulge. No focal disc protrusion. Moderate bilateral facet  arthrosis present. There is mild canal and bilateral foraminal  narrowing, left greater than right.    Extensive fatty atrophy seen within the paraspinous musculature. The  visualized intra-abdominal aorta is of normal caliber and  appearance. No retroperitoneal adenopathy. Visualized kidneys are  atrophic in appearance.     IMPRESSION:  1. Levoscoliosis of the lumbar  spine with associated severe  multilevel degenerative disc disease and facet arthrosis as above,  most prevalent at L1-2 and L2-3 were there is resultant moderate  canal stenosis.  2. Multifactorial degenerative changes as above at L2-3 with severe  right and mild left foraminal stenosis.  3. Multifactorial degenerative changes at L3-4 with severe left and  moderate right foraminal stenosis.  4. Remote compression deformity of the L1 vertebral body with  associated 60% of height loss. No retropulsion. No acute fracture  identified.  5. Grade 1 anterolisthesis of L4 on L5 with superimposed left  foraminal disc protrusion, impinging upon the exiting left L4 nerve  root in the left L4-5 neural foramen.  6. Postoperative changes from remote prior decompressive laminectomy  at L3-4 and L4-5.  7. Right paracentral disc protrusion at  T11-12 without associated  stenosis.  8. Extensive fatty atrophy within the paraspinous musculature.  Electronically Signed    By: Jeannine Boga M.D.    On: 11/23/2013 23:01         Verified By: Neomia Glass, M.D.,   Assessment/Plan:  Assessment/Plan:  Assessment unchanged   Plan continue supportive care   Electronic Signatures: Park Breed (MD)  (Signed 25-Apr-15 17:35)  Authored: Chief Complaint, VITAL SIGNS/ANCILLARY NOTES, Brief Assessment, Lab Results, Radiology Results, Assessment/Plan   Last Updated: 25-Apr-15 17:35 by Park Breed (MD)

## 2014-11-24 NOTE — Discharge Summary (Signed)
Dates of Admission and Diagnosis:  Date of Admission 08-Dec-2013   Date of Discharge 09-Dec-2013   Admitting Diagnosis angioedema   Final Diagnosis Angioedema due to enalapril Htn Seizures CRF- Hyperkalemia GERD    Chief Complaint/History of Present Illness 81-year-old Caucasian female with history of obstructive sleep apnea, chronic kidney disease, DJD, hypertension, stroke with residual left-sided weakness, comes to the Emergency Room after she started having some tingling and swelling of her tongue this morning. She experienced some shortness of breath, noticed some tingling on her lips as well. She had significant swelling of her tongue. Came to the Emergency Room, received Solu-Medrol, ranitidine, and Benadryl. She is being admitted for overnight observation for angioedema, which likely could be due to her ACE inhibitor. The patient is currently feeling better. She is able to swallow water without any difficulty.   Allergies:  Codeine: Unknown  Enalapril: Angioedema  Tramadol: SOB, Resp. Distress  Oxycodone: Dizzy/Fainting, Weakness  capsaicin topical: Rash  Vicodin: Other  Hydrocodone: Other  Hepatic:  08-May-15 06:11   Bilirubin, Total 0.2  Alkaline Phosphatase 61 (45-117 NOTE: New Reference Range 06/23/13)  SGPT (ALT) 20  SGOT (AST) 29  Total Protein, Serum 6.8  Albumin, Serum  2.7  Routine Chem:  08-May-15 06:11   Glucose, Serum 99  BUN  46  Creatinine (comp)  2.62  Sodium, Serum  134  Potassium, Serum  5.2  Chloride, Serum 104  CO2, Serum 26  Calcium (Total), Serum  10.5  Anion Gap  4  Osmolality (calc) 280  eGFR (African American)  18  eGFR (Non-African American)  16 (eGFR values <35m/min/1.73 m2 may be an indication of chronic kidney disease (CKD). Calculated eGFR is useful in patients with stable renal function. The eGFR calculation will not be reliable in acutely ill patients when serum creatinine is changing rapidly. It is not useful in  patients  on dialysis. The eGFR calculation may not be applicable to patients at the low and high extremes of body sizes, pregnant women, and vegetarians.)  09-May-15 12:39   Glucose, Serum  176  BUN  48  Creatinine (comp)  2.40  Sodium, Serum 136  Potassium, Serum 4.7  Chloride, Serum 104  CO2, Serum 24  Calcium (Total), Serum  10.2  Anion Gap 8  Osmolality (calc) 289  eGFR (African American)  20  eGFR (Non-African American)  17 (eGFR values <645mmin/1.73 m2 may be an indication of chronic kidney disease (CKD). Calculated eGFR is useful in patients with stable renal function. The eGFR calculation will not be reliable in acutely ill patients when serum creatinine is changing rapidly. It is not useful in  patients on dialysis. The eGFR calculation may not be applicable to patients at the low and high extremes of body sizes, pregnant women, and vegetarians.)  Routine Hem:  08-May-15 06:11   WBC (CBC) 9.9  RBC (CBC)  2.92  Hemoglobin (CBC)  9.8  Hematocrit (CBC)  29.1  Platelet Count (CBC) 274  MCV 100  MCH 33.6  MCHC 33.6  RDW 12.3  Neutrophil % 54.6  Lymphocyte % 25.2  Monocyte % 10.0  Eosinophil % 9.4  Basophil % 0.8  Neutrophil # 5.4  Lymphocyte # 2.5  Monocyte #  1.0  Eosinophil #  0.9  Basophil # 0.1 (Result(s) reported on 08 Dec 2013 at 07:06AM.)   Pertinent Past History:  Pertinent Past History 1.  Obstructive sleep apnea, on CPAP.  2.  CKD, stage III.  3.  DJD.  4.  History  of CVA with residual left-sided weakness.  5.  Seizure disorder.  6.  History of hypertension.   Hospital Course:  Hospital Course *  Angioedema: Given IV steroids, monitored in hospital-  resolved .    Tolerated diet, no SOB. * Hypertension:  continued her home medications, which were Coreg, hydralazine, and clonidine. Enalapril taken off from the patient's medication list.  *  Morbid obesity.  *  Gastroesophageal reflux disease: On PPI.  * Obstructive sleep apnea: The patient is on CPAP.  continued. * Seizure disorder, chronic: Continued Dilantin.  * CRF- Hyperkalemia- rechecked- K came down.   Condition on Discharge Stable   Code Status:  Code Status Full Code   DISCHARGE INSTRUCTIONS HOME MEDS:  Medication Reconciliation: Patient's Home Medications at Discharge:     Medication Instructions  phenytoin 100 mg oral capsule, extended release  2 caps (241m) orally once a day (at bedtime)   ferrous sulfate 325 mg oral tablet  1 tab(s) orally 3 times a day   clonidine 0.3 mg oral tablet  1 tab(s) orally 2 times a day   hydralazine 50 mg oral tablet  1.5 tabs (75 mg) orally 2 times a day (4am and 2pm)   fentanyl 50 mcg/hr transdermal film, extended release  1 patch transdermal every 3 days   alprazolam 0.5 mg oral tablet  0.5 tab(s) orally once a day (at bedtime)   pantoprazole 40 mg oral delayed release tablet  1 tab(s) orally 2 times a day   diltiazem 240 mg/24 hours oral capsule, extended release  1 cap(s) orally 2 times a day   docusate sodium sodium 100 mg oral capsule  1 cap(s) orally every other day   metaxalone 800 mg oral tablet  1 tab(s) orally every 8 hours for muscle spasms.   mirtazapine 30 mg oral tablet  1 tab(s) orally once a day for depression   ondansetron 4 mg oral tablet, disintegrating  1 tab(s) orally every 12 hours, As Needed - for Nausea, Vomiting   aspirin enteric coated 81 mg oral delayed release tablet  1 tab(s) orally once a day   furosemide 20 mg oral tablet  1 tab(s) orally every other day   gabapentin 100 mg oral capsule  1 cap(s) orally 3 times a day   lidocaine 5% topical film  Apply 1 patch topically to affected area every 12 hours   mag-al plus xs 400 mg-400 mg-40 mg/5 ml oral suspension  30 milliliter(s) orally every 4 hours as needed for gas/ingestion   preservision antioxidant multiple vitamins and minerals oral capsule  1 cap(s) orally once a day   nitroglycerin 0.4 mg sublingual tablet  1 tab(s) sublingual every 5 minutes, As Needed  up to 3 doses for chest pain.  *If no relief call MD or go to emergency room*   prednisone 20 mg oral tablet  1 tab(s) orally once a day x 2 days   carvedilol 6.25 mg oral tablet  1 tab(s) orally 2 times a day   hydralazine 50 mg oral tablet  1 tab(s) orally once a day (at bedtime)   alprazolam 0.5 mg oral tablet  1 tab(s) orally every 12 hours, As Needed for anxiety    STOP TAKING THE FOLLOWING MEDICATION(S):    enalapril 10 mg oral tablet: 1 tab(s) orally 2 times a day levaquin 750 mg oral tablet: 1 tab(s) orally every 24 hours  Physician's Instructions:  Diet Low Sodium   Activity Limitations As tolerated   Return to  Work Not Applicable   Time frame for Follow Up Appointment 1-2 weeks   Other Comments routine follow ups with PMD.   Electronic Signatures: Vaughan Basta (MD)  (Signed 13-May-15 14:46)  Authored: ADMISSION DATE AND DIAGNOSIS, CHIEF COMPLAINT/HPI, Allergies, PERTINENT LABS, PERTINENT PAST [place] MEDS, PATIENT INSTRUCTIONS   Last Updated: 13-May-15 14:46 by Vaughan Basta (MD)

## 2014-11-24 NOTE — H&P (Signed)
PATIENT NAME:  [name], Reiana R MR#:  [id number] DATE OF BIRTH:  1922/12/25  DATE OF ADMISSION:  08/14/2013  PRIMARY CARE PHYSICIAN: Dr. Dewaine Oatsenny Tate.   REFERRING PHYSICIAN: Dr. Lenard LancePaduchowski.   CHIEF COMPLAINT: Generalized weakness, shortness of breath.   HISTORY OF PRESENT ILLNESS: [name] is an 81 year old female who recently was admitted to [facility] from 07/15/2013 through 07/19/2013 with symptoms of TIA. Workup at that time showed a left carotid artery stenosis. The patient had a consultation by vascular surgery, Dr. Gilda CreaseSchnier. The patient underwent preop evaluation, getting a stress test as well as pulmonary function tests. The patient states since the workup was done, the patient has been experiencing severe generalized weakness, shortness of breath even with mild exertion. The patient is unable to speak full sentences. The patient was discharged with physical therapy; however, the patient is unable to participate and progress in physical activities. Also noted to have increased swelling in the lower extremities. Has been experiencing PND, orthopnea. Concerning this, the patient is brought to the Emergency Department. Workup in the Emergency Department, the patient was noted to have blood pressure of 210/110. Chest x-ray did not show any obvious pulmonary edema. The patient was noted to have a creatinine of 2.31. BNP was 440.   PAST MEDICAL HISTORY:  1. Obstructive sleep apnea, on CPAP.  2. Chronic kidney disease stage IV.  3. Degenerative joint disease.  4. Remote history of DVT.   5. 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  6. Seizure disorder.  7. Hypertension.  8. Left carotid artery stenosis.  9. Anemia.   PAST SURGICAL HISTORY:  1. Bilateral knee surgery.  2. Appendectomy.  3. Lumpectomy.  4. Cataract extraction.  5. Back surgery.  6. Dilatation and curettage.  7. Tonsillectomy.  8. Joint replacement.    ALLERGIES: CODEINE,  HYDROCODONE, OXYCODONE, TRAMADOL AND VICODIN.   HOME MEDICATIONS:  1. Skelaxin 800 mg every 8 hours as needed.  2. Phenytoin 100 mg 2 capsules once a day.  3. Zofran 4 mg 2 times a day.  4. Hydralazine 50 mg 1 tablet 4 times a day.  5. Ferrous sulfate 325 mg 3 times a day.  6. Enalapril 20 mg 2 times a day.  7. Docusate sodium 1 capsule once a day.  8. Cardizem 240 mg 1 tablet 2 times a day.  9. Clonidine 0.1 mg 3 times a day.  10. Coreg 3.125 mg once a day.   SOCIAL HISTORY: Former smoker. Currently denies smoking, drinking alcohol or using illicit drugs. Lives with her daughter.    FAMILY HISTORY: Significant for coronary artery disease.    REVIEW OF SYSTEMS:  CONSTITUTIONAL: Severe generalized weakness.  EYES: No change in vision.  EARS, NOSE, THROAT: No change in hearing.   RESPIRATORY: No cough. Has shortness of breath.  CARDIOVASCULAR: Has been experiencing some chest pressure. Has lower extremity swelling.  GASTROINTESTINAL: Decreased appetite. Having regular bowel movements.  GENITOURINARY: No dysuria or hematuria.  HEMATOLOGIC: No easy bruising or bleeding.  MUSCULOSKELETAL: Has history of osteoarthritis.  NEUROLOGIC: No  or numbness in any part of the body.   PHYSICAL EXAMINATION:  GENERAL: This is a well-built, well-nourished, age-appropriate female lying down in the bed, not in distress.  VITAL SIGNS: Temperature 97.9, pulse 71, blood pressure 220/74, respiratory rate of 18, oxygen saturation is 94% on room air.  HEENT: Head normocephalic, atraumatic. There is no scleral icterus. Conjunctivae normal. Pupils equal and react to light. Extraocular movements are intact.  Mucous membranes moist. No pharyngeal erythema.  NECK: Supple. No lymphadenopathy. No JVD. No carotid bruit.  CHEST: Has no focal tenderness.  LUNGS: Bilaterally clear to auscultation.  HEART: S1, S2 regular. No murmurs are heard.  ABDOMEN: Bowel sounds present. Soft, nontender, nondistended.   EXTREMITIES: Pitting edema 2 to 3+ extending up to the knees.  SKIN: No rash or lesions.  MUSCULOSKELETAL: Good range of motion in all of the extremities.  NEUROLOGIC: The patient is alert, oriented to place, person and time. Cranial nerves II through XII intact. Motor 5/5 in upper and lower extremities.   LABORATORIES: Dilantin 6.4.   CT head without contrast: No acute intracranial abnormality.   UA negative for nitrites and leukocyte esterase. CBC: Hemoglobin 8.1. The rest of all of the values are within normal limits.   CMP: BUN 39, creatinine of 2.31. The rest of all of the values are within normal limits.   ASSESSMENT AND PLAN: Ms. Friesenhahn is an 81 year old female who comes to the Emergency Department with severe generalized weakness, shortness of breath with mild exertion.  1. Congestive heart failure, diastolic: Uncertain of the precipitating event. The patient has lower extremity swelling, paroxysmal nocturnal dyspnea, orthopnea. Will keep the patient on Lasix 40 mg daily. Strict ins and outs and daily weights.  2. Hypertension, accelerated: Continue with the home medications and follow up. Keep the patient on labetalol as needed.  3. Debility: Will involve physical therapy and occupational therapy.  4. Chronic kidney disease, stable.  5. History of seizures: Continue with Dilantin.  6. Keep the patient on deep vein thrombosis prophylaxis with Lovenox.   TIME SPENT: 50 minutes.    ____________________________ Susa Griffins, MD pv:gb D: 08/15/2013 01:15:01 ET T: 08/15/2013 02:02:08 ET JOB#: [id number]  cc: Susa Griffins, MD, <Dictator> Jillene Bucks. Arlana Pouch, MD Clerance Lav Detra Bores MD ELECTRONICALLY SIGNED 08/27/2013 21:20

## 2014-11-24 NOTE — Discharge Summary (Signed)
PATIENT NAME:  Kristina Wilson, LOSEY MR#:  [id number] DATE OF BIRTH:  [birth year]  DATE OF ADMISSION:  09/15/2013 DATE OF DISCHARGE:    ADMISSION DIAGNOSES: 1.  Symptomatic critical stenosis of the left internal carotid artery.  2.  Hypertension. 3.  Diastolic congestive heart failure. 4.  Chronic anemia.  5.  Seizure disorder.  DISCHARGE DIAGNOSES: 1.  Symptomatic critical stenosis of the left internal carotid artery.  2.  Hypertension. 3.  Diastolic congestive heart failure. 4.  Chronic anemia.  5.  Seizure disorder.  CONSULTS: Hospitalist.   HOSPITAL PROCEDURES: Left carotid endarterectomy on 09/15/2013.   BRIEF HISTORY: The patient is an 81 year old female, who presented to the hospital about 1 month ago with a left hemispheric stroke. A work-up demonstrated critical stenosis of the left ICA. She has undergone a short course of rehab and improved dramatically and returned nearly to baseline. She is now undergoing the carotid endarterectomy since she is not a stent candidate.   HOSPITAL COURSE: Left carotid endarterectomy was performed by Dr. Gilda Crease on 09/15/2013. She was monitored in the postop period closely and admitted to critical care. On postop day 1, she was having some difficulty with swallowing and swallow evaluation was done. She remained n.p.o. for the time being. Her medications were changed to IV. Focused on controlling her blood pressure with IV medications. She had to come off the nitroglycerin drip due to headaches this day. On postop day 2, 09/17/2013, she was feeling better and doing okay eating ice chips. Nitroglycerin drip was restarted due to difficulty controlling blood pressure on her other IV antihypertensive medications. She tolerated that well. On postop day 3, 09/18/2013, we had a repeat swallow evaluation and she started on some liquids. She was on nitroglycerin and Cardizem drips. She also was able to get up to the chair today. On postop day 4, 09/19/2013,  she  was tolerating liquids well. Able to be back on her p.o. medications. Clonidine was increased from her home medications and she had been doing well on at 0.3 mg b.i.d. We continued to monitor her blood pressure. Today, postop day 5,  09/20/2013, she is doing well. She is up to the chair. She is back on all her p.o. medications with blood pressure controlled. Tolerating a liquid diet. She did not want to have soft foods this morning, but we will plan to advance as tolerated with what she is comfortable with. She is now ready for discharge back to [facility] or skilled nursing facility as able to find one available.   DIET ON DISCHARGE: Regular as tolerated.   ACTIVITY: As tolerated. No exertional activity.   DISCHARGE MEDICATIONS:  1.  Phenytoin 100 mg oral extended release two caps orally once a day at bedtime. 2.  Ferrous sulfate 325 mg 1 tablet orally 3 times a day.  3.  Diltiazem 240 mg - 24 hours oral tablet extended release 1 tablet orally 2 times a day.  4.  Hydralazine 50 mg oral tablet take 1 tab orally 4 times a day. 5.  Fentanyl 12 mcg per hour transdermal film extended release 1 transdermal packed every 72 hours. 6.  Carvedilol 3.125 mg oral tablet 1 p.o. once a day at bedtime. 7.  Docusate sodium 100 mg 1 capsule orally once a day at bedtime.  8.  Ondansetron 4 mg oral tablet 1 tablet orally 2 times a day as needed for nausea or vomiting. 9.  Alprazolam 0.5 mg take 1 tab orally every 12 hours as  needed for anxiety.  10. Lidocaine 5% topical film one patch topically once a day applied to right hip, leave on for 12 hours and then remove for 12 hours.  11. Skelaxin 100 mg oral tablet 1 tablet orally every 8 hours for back pain.  12. Vasotec 10 mg oral tablet take 1 oral tablet 2 times a day.  13. Clonidine 0.3 mg take 1 p.o. 2 times a day. 14. Aspirin 81 mg daily. 15. Acetaminophen 325 mg oral tablet 2 tablets orally every 4 to 6 hours as needed for pain.  FOLLOWUP: In 1 to 2  weeks with Dr. Gilda CreaseSchnier. We will plan for physical therapy on discharge. Plan for speech and swallow evaluation on Friday. For more information, please see the patient's chart. We will be on trying to get her back to [facility], if not we will find another facility for her to be discharged to.   ____________________________ Hoyle Sauerhelsea N. Lum Stillinger, PA-C cnh:aw D: 09/20/2013 11:39:02 ET T: 09/20/2013 11:52:33 ET JOB#: [id number]  cc: Hoyle Sauerhelsea N. Jerilee Space, PA-C, <Dictator> Saanya Zieske N Danaysha Kirn PA ELECTRONICALLY SIGNED 09/25/2013 10:00

## 2014-11-24 NOTE — Consult Note (Signed)
Pt seen and examined. Full consult to follow. Pt with severe back pain. Pt with hx of CHF? On bASA daily. Started on prednisone taper. May be discharged to rehab tomorrow. Back pain worsens whenever patient moves. Pt had issues with anemia. Seen by Dr. Sherrlyn HockPandit requiring blood transfusions in the past. Was supposed to have colonoscopy and EGD last fall but cancelled at the last minute due to patient developing shingles. Pt does decribe decreased appetite with chronic nausea. No abd pain. Alternating constipation and diarrhea. No hx of GI bleeding. No prior endoscopies. Possible duodenal ulcer. In light of ASA and prednisone use, patient at risk of developing bleeding ulcer. Therefore, patient needs to go on daily PPI upon discharge. Have patient f/u with either me or Owens Sharkawn Harrison in office in few weeks. Once patient able to move around without significant back pain, will schedule both EGD and colonoscopy. WIll sign off. Thanks.  Electronic Signatures: Lutricia Feilh, Kalah Pflum (MD)  (Signed on 26-Apr-15 10:33)  Authored  Last Updated: 26-Apr-15 10:33 by Lutricia Feilh, Janae Bonser (MD)

## 2014-11-24 NOTE — Discharge Summary (Signed)
Wilson NAME:  Kristina Wilson, TADROS MR#:  [id number] DATE OF BIRTH:  06-05-1923  DATE OF ADMISSION:  07/16/2013 DATE OF DISCHARGE:  07/19/2013  DISCHARGE DIAGNOSES: 1.  Transient ischemic attack. 2.  Hyperlipidemia.  3.  Left carotid stenosis.  4.  Hypertension with difficulty to control.  5.  Seizure disorder.  6.  Chronic kidney disease stage 3.     DISCHARGE MEDICATIONS:  1.. 2.  Ferrous sulfate 325 mg p.o. t.i.d.  3.  Cardizem 240 mg 1 tablet p.o. b.i.d.  4.  Enalapril 20 mg p.o. b.i.d.  5.  Skelaxin 800 mg p.o. b.i.d. as needed. 6.  Senna as needed for constipation.  7.  Aspirin 325 mg p.o. daily.  8.  Simvastatin 20 mg p.o. daily.  Please note that aspirin was increased to 325 and simvastatin also is a new medication.  9.  Hydralazine 50 mg p.o. 4 times daily.  10.  Coreg 3.125 mg p.o. daily.  11.  Catapres 0.1 mg p.o. t.i.d.  12.  The Wilson was taking clonidine 0.1 mg p.o. b.i.d. but it is increased to t.i.d.   DISPOSITION:  Discharged home with home physical therapy.   DIET: Low-sodium, low-fat diet, regular consistency. The Wilson will be discharged home with home health.   CONSULTATIONS: Vascular consult with Dr. Gilda Crease.   HOSPITAL COURSE: 1.  TIA.  An Kristina Wilson with history of hypertension, hypothyroidism, ,htn. history of hemorrhagic stroke, came in because of mumbling speech and unable to follow the commands.  Please look at the history and physical for full details. The Wilson was watching TV with the daughter, suddenly had garbled speech and also vision was a little blurry. The Wilson did not lose consciousness, admitted to the hospitalist service for possible TIA.  The Wilson's blood pressure has been around 200 systolic.  CT head did not show any acute changes. The Wilson was placed on observation, got like neuro checks done. The Wilson continued on aspirin and statins. The Wilson also had carotid ultrasound, echocardiogram and MRI, seen by  physical therapist also. The Wilson's EKG had sinus tachycardia, 85 beats per minute, no ST-T changes. She had an MRI, echocardiogram and echocardiogram showed EF of 50% to 60% with normal LV function, vascular filling pattern defect and also carotid ultrasound showed 75% stenosis on the left side which is a heavy calcified plaque in the left with 70% stenosis on the right side, no focal stenosis. MRI of the brain showed no acute infarct, chronic small vessel changes. The Wilson's LDL is up at 112 and triglycerides 259 and cholesterol 206 so she started on statins.  2.  Regarding carotid stenosis, she was seen by Dr. Gilda Crease. The Wilson had a selective angiogram yesterday and it did confirm 75% stenosis on the left side. The Wilson could not get CT angio of her carotids because of her CKD stage 3.  She was hydrated before the procedure with IV bicarb and she also got Mucomyst. The Wilson's kidney function stayed stable after the procedure as well. Creatinine the is around 2 and the Wilson's creatinine is fluctuating between 2 and 2.2. The Wilson's does have CKD stage 3.   3.  Regarding hypertension. The Wilson did have a lot of trouble with controlling blood pressure but this was going on before she came in. The Wilson did have trouble with her blood pressure with systolic likes staying around 045'W. We tried to increase the clonidine to 0.2 mg b.i.d. but the Wilson's blood pressure dropped  to [id number]'s and 130's. the Wilson really felt dizzy with the blood pressure being in 110 and 120's.  She said that her blood pressure has been running high and she would be comfortable with blood pressure of around systolic 140's.  I advised her to take clonidine 0.1 mg t.i.d.  We increased the hydralazine to 50 mg q.i.d.  The Wilson is already on lisinopril and Coreg that she can continue and follow up with Dr. Arlana Pouchate.  4.  Regarding seizure disorder. The Wilson said she has never had seizure in like a long time,  probably about 10 years, but she is still on Keppra. I advised her to follow up with Dr. Arlana Pouchate and see if she can come off the seizure medication.  5.  Regarding deconditioning, seen by physical therapy, recommended home health.  6.  The Wilson had suspected pneumonia but she did not have any symptoms. She did receive Levaquin when she came.   The Wilson needs cardiology clearance and also pulmonary clearance before the carotid surgery next month so the Wilson needs Dr. Arlana Pouchate to set that up as outpatient for preop clearance for carotid surgery.  She needs pulmonary and also cardiology clearance. The Wilson understands this and we will make appointment with Dr. Arlana Pouchate.  The Wilson those clearances before surgery. Discharge blood pressure is 170/70, heart rate 77. The Wilson will get her morning medications.  She does have sleep apnea, uses CPAP at night.   TIME SPENT ON DISCHARGE PREPARATION: More than 30 minutes.    ____________________________ [name] Tinya Cadogan, MD sk:cs D: 07/19/2013 09:48:28 ET T: 07/19/2013 15:29:58 ET JOB#: [id number]  cc: [name] [name], MD, <Dictator> Renford DillsGregory G. Schnier, MD [name] [name], MD [name] [name] MD ELECTRONICALLY SIGNED 08/05/2013 17:19

## 2014-11-24 NOTE — Consult Note (Signed)
PATIENT NAME:  Kristina Wilson, Kristina Wilson MR#:  [id number] DATE OF BIRTH:  October 07, 1922  DATE OF CONSULTATION:  11/26/2013  CONSULTING PHYSICIAN:  Ezzard StandingPaul Y. Latron Ribas, MD  REASON FOR REFERRAL:  Abnormal CT scan.   DESCRIPTION: The patient is a 81 year old white female who was admitted on April 24 with her severe back pain that was going on for at least three days or so with radiation to both of her legs and accompanied by some weakness in both her legs. During the work-up a CT of the abdomen was done to rule out dissecting abdominal aortic aneurysm. There was no dissection. However, this did show some abnormal thickening of the wall proximal duodenal wall.   According to the patient, the patient was scheduled for colonoscopy and possibly upper endoscopy last fall when she was found to be anemic requiring blood transfusions. The patient was seen by Dr. Sherrlyn HockPandit at that time. Unfortunately, they day before she was supposed to have the colonoscopy, she developed shingles. The procedure was canceled and never rescheduled.   The patient does complain of some chronic nausea before and after  eating. She denies any vomiting. She denies any heartburn or indigestion. However, she does admit to decreased appetite with some weight loss as well. There is no abdominal pain at all. She has some irregular bowel habits where she could alternate from constipation to diarrhea. There is no gross hematochezia or melena.   The patient denies ever having any ulcer disease. She denied any significant heartburn. She denied ever having a prior colonoscopy either.    REVIEW OF SYSTEMS: There is no fever or chill, some weight loss. There are no visual or hearing changes. No chest pain or palpitation. No coughing or shortness of breath. GI symptoms have been described already. The rest of the review of symptoms is noncontributory.   PAST MEDICAL HISTORY: Notable for sleep apnea and chronic renal disease. She has had a history of stroke with some  residual left-sided weakness. She has had a seizure disorder as well as hypertension. There is currently no alcohol or tobacco use.   FAMILY HISTORY: Notable for coronary artery disease.   ALLERGIES: INCLUDE CODEINE, HYDROCODONE, OXYCODONE, TRAMADOL AND VICODIN.   HOME MEDICATIONS: Include baby aspirin. She also takes fentanyl patch, Tylenol, Vasotec b.i.d., clonidine b.i.d., diltiazem b.i.d., Dilantin at bedtime, Remeron 30 mg daily, Zofran every 12 hours, alprazolam as needed, Coreg 3.125 mg at bedtime, Lasix 20 mg every other day, iron 3 times daily, Colace every other day, Skelaxin every eight hours, hydralazine at bedtime and morning and afternoon.   PHYSICAL EXAMINATION: GENERAL: The patient is in no acute distress right now, she is lying in bed. She tells me every time she moves her back would hurt more.  VITAL SIGNS: Stable.  HEENT: Normocephalic, atraumatic head. Pupils are equally reactive. Throat was clear.  NECK: Supple.  CARDIAC: Regular rhythm and rate. Clear bilaterally.  ABDOMEN: Show normoactive bowel sounds, soft, and nontender. There was no palpable masses . She has active bowel sounds. There is no hepatomegaly.  EXTREMITIES: No clubbing, cyanosis, or edema.  NEUROLOGIC: Examination is nonfocal today.   LABORATORY DATA: Most recent labs showed a creatinine was 1.71, potassium 5.7, sodium 137. Iron, TIBC 188. Iron saturation is 27, ferritin level is 31, albumin is 2.5. Dilantin level is 8.9. TSH level was 0.864. Hemoglobin is 10.1. MCV is 102, and PTH level is normal at 24. CT scan showed evidence of advanced degenerative changes and disk protrusion. The patient was  placed on oral prednisone taper as well as pain medication and back brace.   IMPRESSION: This is a patient with chronic anemia with abnormal CT finding of the duodenum. She could have duodenitis or duodenal ulcer. In light of her being on baby aspirin and prednisone taper, she could develop or worsen a bleeding  ulcer. I recommend that she goes on daily Protonix. She needs to stay on it as long as she is is on prednisone and aspirin. She will probably go to rehab starting tomorrow. I would like to have her follow-up with [place] in a couple of weeks for follow-up. We will then schedule her both upper endoscopy and colonoscopy at the hospital with propofol due to her overall medical condition and age once she is able to move around without severe back pain. She needs to be able to go to bathroom during the bowel prep  without significant difficult before we can schedule the colonoscopy.   Thank you for the referral.  ____________________________ Ezzard Standing. Bluford Kaufmann, MD pyo:sg D: 11/26/2013 10:42:18 ET T: 11/26/2013 11:52:43 ET JOB#: [id number]  cc: Ezzard Standing. Bluford Kaufmann, MD, <Dictator> Ezzard Standing Davonda Ausley MD ELECTRONICALLY SIGNED 11/28/2013 11:30

## 2014-11-24 NOTE — H&P (Signed)
PATIENT NAME:  Kristina Wilson, Kristina Wilson MR#:  [id number] DATE OF BIRTH:  03-27-1923  DATE OF ADMISSION:  03/07/2014  REFERRING PHYSICIAN:  Lurena Joinerebecca L. Lord, MD.  PRIMARY CARE PHYSICIAN:  Jillene BucksDenny [name], MD.  CHIEF COMPLAINT: Weakness.   HISTORY OF PRESENT ILLNESS: An 81 year old Caucasian female with history of chronic kidney disease, history of stroke, seizure disorder, hypertension, presenting with weakness, describes gradual worsening and weakness for the last 1-2 week duration now has additional 2-day duration of headache which describes frontal location, throbbing in quality, 5-6/10 in intensity. No change with lights or loud noises and no radiation.  She also now describes 1 day duration of nausea, vomiting and bilateral lower extremity paresthesias.  Of note, she was previously on prednisone 10 mg p.o. daily, for which she says, "few months," which was recently abruptly stopped 3-4 days ago when she was started on antibiotic therapy.  However, she was.   REVIEW OF SYSTEMS: CONSTITUTIONAL: Positive for fatigue, weakness as described above. Denies fevers, chills.  EYES: Denies blurry, double vision, eye pain. ENT:  Denies tinnitus, ear pain, hearing loss.  RESPIRATORY: Denies cough, wheeze, shortness of breath.  CARDIOVASCULAR:  Denies chest pain, palpitations, edema.  GASTROINTESTINAL: Positive for nausea and vomiting.  Denies abdominal pain.  GENITOURINARY: Denies dysuria, hematuria.  ENDOCRINE: Denies nocturia or thyroid problems.  HEMATOLOGIC AND LYMPHATIC: Denies easy bruising or bleeding.  SKIN: Denies rashes or lesions.  MUSCULOSKELETAL: Denies pain in neck, back, shoulder, knees, hips or arthritic symptoms.  NEUROLOGIC: Denies paralysis positive, paresthesias in the lower extremities as described above.  PSYCHIATRIC:  Denies anxiety or depressive symptoms.  Otherwise, full review of systems performed by me is negative.   PAST MEDICAL HISTORY: Chronic kidney disease, obstructive sleep  apnea on chronic CPAP therapy, history of stroke, seizure disorder, hypertension.   SOCIAL HISTORY: Denies any alcohol, tobacco, or drug use.   FAMILY HISTORY: Positive for coronary artery disease.   ALLERGIES: Capsaicin, codeine, enalapril, hydrocodone, oxycodone, tramadol and Vicodin.   HOME MEDICATIONS: Prednisone 10 mg p.o. daily, which was stopped about 4 days ago, aspirin 81 mg p.o. daily, fentanyl 50 mcg transdermal patch every 3 days, clonidine 0.3 mg p.o. b.i.d., Cardizem 240 mg p.o. b.i.d.,  phenytoin 100 mg 4 capsules at bedtime, gabapentin 100 mg p.o. 3 times daily, mirtazapine 50 mg p.o. daily, alprazolam 0.25 mg 1/2 tablet p.o. at bedtime, Coreg 0.25 mg p.o. b.i.d., lidocaine 5% topical film 1 patch b.i.d., Lasix 20 mg p.o. daily, Feosol 325 mg p.o. 3 times daily, Colace 100 mg p.o. every other day, metaxalone 800 mg p.o. q. 8 hours, pantoprazole 40 mg p.o. b.i.d., hydralazine 75 mg in the morning and afternoon and 50 mg bedtime,  PHYSICAL EXAMINATION: VITAL SIGNS: Temperature 98.2, heart rate 78, respirations 18, blood pressure 217/94, currently 180/67, saturating 95% on room air. Weight 108.9 kg, BMI of 41.2.  GENERAL: Obese, Caucasian female, currently in no acute distress.  HEAD: Normocephalic, atraumatic.  EYES: Pupils equal, round, reactive to light. Extraocular muscles intact. No scleral icterus.  MOUTH: Moist mucosal membranes. Dentition intact. No abscess noted.  EARS, NOSE AND THROAT:  Clear without exudates. No external lesions.  NECK: Supple. No thyromegaly. No nodules. No JVD.  PULMONARY: Diminished breath sounds secondary to body habitus throughout all lung fields.  No wheezes, rales, or rhonchi.  CHEST: Nontender to palpation.  CARDIOVASCULAR: S1, S2, regular rate and rhythm. No murmurs, rubs, or gallops. There is 2+ edema of lower extremities to the shins bilaterally. Pedal pulses  2+ bilaterally.  GASTROINTESTINAL: Soft, nontender, nondistended. No masses. Positive  bowel sounds. No hepatosplenomegaly.  MUSCULOSKELETAL: No swelling, clubbing, or edema. Range of motion full in all extremities.  NEUROLOGIC: Cranial nerves II through XII intact. No gross focal neurological deficits. Sensation reasonably intact.  SKIN: No ulceration, lesions, rash, cyanosis. Skin warm, dry. Turgor intact.  PSYCHIATRIC: Mood and affect within normal limits.  Patient is awake, alert and oriented  x3. Insight and judgment intact.   LABORATORY DATA: Sodium 135, potassium 3.9, chloride 99, bicarbonate 26, BUN 24, creatinine 2.15, glucose 137. Troponin less than 0.02. WBC 7.7, hemoglobin 9.1, platelets of 246,000. Chest x-ray performed, vascular congestion. Small  effusion,  atelectasis. CT head: No acute abnormality.   REMAINDER OF LABORATORY DATA: Sodium 135, potassium 3.9, chloride 99, bicarbonate 26, BUN 24, creatinine 2.15, glucose 137, calcium 11.4 with most recent albumin of 2.8. WBC 7.7, hemoglobin 9.1, platelets of 246,000.  ASSESSMENT AND PLAN:  A 81 year old Caucasian female with history of chronic kidney disease, cerebrovascular accident, seizure disorder, presenting with weakness. 1.  Hypercalcemia corrected for albumin. Her calcium is 12.4.  Given her symptoms of nausea, vomiting, paresthesias, headache, weakness; this is the likely etiology. Provide IV fluid hydration, recheck calcium in the morning. If it remains elevated, despite fluid resuscitation, would try calcitonin.  Also check a vitamin D and parathyroid hormone level.  2.  Weakness, likely from a symptom of hypercalcemia.  This is confounding factor of abrupt steroid cessation.  Will check an a.m. Cortisol at 6 a.m.  Will dose Decadron 4 mg IV now and continue.  If cortisol is within normal limits, stop with Decadron for relative adrenal insufficiency.  3.  Hypertension. Continue with Coreg, clonidine, hydralazine.  Add 10 p.Wilson.n. hydralazine for blood pressure greater than 180//100.  4.  Gastroesophageal reflux  disease.  PPI therapy.  5.  Seizure disorder. Continue with Dilantin.  6.  Venous thromboembolism prophylaxis with heparin subcutaneous.   CODE STATUS: The patient is full code.   TIME SPENT: 45 minutes.    ____________________________ Cletis Athens. Andreana Klingerman, MD dkh:ds D: 03/07/2014 00:52:27 ET T: 03/07/2014 02:24:54 ET JOB#: [id number]  cc: Cletis Athens. Rehema Muffley, MD, <Dictator> Gibson Lad Synetta Shadow MD ELECTRONICALLY SIGNED 03/07/2014 3:12

## 2014-11-24 NOTE — Op Note (Signed)
PATIENT NAME:  Kristina Wilson, Kristina Wilson MR#:  [id number] DATE OF BIRTH:  [birth year]  DATE OF PROCEDURE:  09/15/2013  PREOPERATIVE DIAGNOSIS: Symptomatic critical stenosis of the left internal carotid artery.   POSTOPERATIVE DIAGNOSIS: Symptomatic critical stenosis of the left internal carotid artery.   PROCEDURE PERFORMED: Left carotid endarterectomy with primary closure.   SURGEON: Levora Dredge, M.D.   ANESTHESIA: General by endotracheal intubation.   FLUIDS: Per anesthesia record.   ESTIMATED BLOOD LOSS: 100 mL.   SPECIMEN: Plaque to pathology, permanent section.   INDICATIONS: Kristina Wilson is an 81 year old woman who presented to the hospital approximately 1 month ago with a CVA, left hemispheric. Work-up demonstrated critical stenosis of the left internal carotid artery. She has subsequently undergone a short course of rehab and is improved dramatically and returned nearly to baseline. She is now undergoing carotid endarterectomy. She is not a stent candidate. Risks and benefits were reviewed. All questions answered. The patient has agreed to proceed.   DESCRIPTION OF PROCEDURE: The patient is taken to the operating room and placed in the supine position. After adequate general anesthesia is induced and appropriate invasive monitors are placed, she is positioned supine with her neck very slightly extended and very slightly rotated as she has significant degenerative arthritis of her neck and is almost immobile. Left neck and chest wall are then prepped and draped in sterile fashion. A curvilinear incision is created along the anterior margin of the sternocleidomastoid muscle and carried down through the soft tissues transecting the platysma. Small vessels are controlled with Bovie cautery or 3-0 silk ties. The jugular vein is then identified and the dissection is carried both proximally and distally along the medial aspect of the jugular vein. Omohyoid is identified and the dissection is carried  deeper at this level identifying the common carotid. Dissection is then carried along the common carotid identifying the vagus which is quite adherent to the posterior aspect of the bulb and the internal carotid artery and this is meticulously freed from the artery so as not to be involved with any sort of clamp injury. The nerve itself is completely undisturbed. The external carotid artery is looped with a red Silastic vessel loop and the superior thyroid is looped with a blue loop. 7000 units of heparin is given and allowed to circulate for approximately 5 minutes.   A shunt is then delivered onto the field. The common carotid, followed by the external, followed by the internal carotid artery is clamped. Arteriotomy is made, extended with Potts scissors, and the shunt is then placed without difficulty restoring flow to the brain.   Endarterectomy is then performed under direct visualization for the distal common bulb and internal carotid artery. External carotid artery is treated with an eversion technique. Four 7-0 Prolene interrupted sutures are used to tack the distal intimal edge within the internal carotid artery. The internal carotid artery is quite large measuring approximately 7 mm in diameter and therefore primary closure is performed using running 6-0 Prolene. Shunt is removed, the artery is flushed copiously with heparinized saline, and the suture line is completed. Flow is then re-established first to the external carotid artery and then the internal carotid artery to prevent distal embolization. Suture line is inspected and found to be hemostatic. Evicel is then placed posteriorly in the bed of the wound and Surgicel is laid along the suture line.   The platysma is then reapproximated with running 3-0 Vicryl, and the skin is closed with 4-0 Monocryl subcuticular and Dermabond.  The patient tolerated the procedure well. She will be awakened in the operating room to verify she follows simple  commands and moves all extremities and then will be transported to the recovery room.   ____________________________ Renford DillsGregory G. Schnier, MD ggs:sb D: 09/15/2013 10:17:49 ET T: 09/15/2013 10:30:48 ET JOB#: [id number]  cc: Renford DillsGregory G. Schnier, MD, <Dictator> [name] [name], MD Renford DillsGREGORY G SCHNIER MD ELECTRONICALLY SIGNED 09/19/2013 14:26

## 2014-11-24 NOTE — H&P (Signed)
PATIENT NAME:  Kristina Wilson, Kristina Wilson MR#:  [id number] DATE OF BIRTH:  Jun 14, 1923  DATE OF ADMISSION:  11/24/2013  REFERRING PHYSICIAN: Dr. Mindi Junker  PRIMARY CARE PHYSICIAN: Dr. Dewaine Oats  CHIEF COMPLAINT: Back pain.   HISTORY OF PRESENT ILLNESS:  An 81 year old Caucasian female with history of obstructive sleep apnea, chronic kidney disease, degenerative joint disease, hypertension, history of stroke with residual left-sided weakness presenting with back pain. Describes worsening back pain for 3 days' duration, described as of her entire back, intensity 10 out of 10, sharp and aching in quality, radiating down her legs bilaterally. No urinary or bowel incontinence. States she is now unable to walk secondary to pain for 1 day in total, denies any further symptomatology. This is a chronic issue for her. She was scheduled to see orthopedics; however was unable to make it secondary to her current symptoms. Currently without complaints.   REVIEW OF SYSTEMS:  CONSTITUTIONAL: Denies fever, fatigue, chills.  EYES: Denies blurred vision, double vision, eye pain.  EARS, NOSE, AND THROAT:  Denies tinnitus, ear pain, hearing loss.  RESPIRATORY: Denies cough, wheeze, shortness of breath. CARDIOVASCULAR:  Denies chest pain, palpitations, edema.  GASTROINTESTINAL: Denies nausea, vomiting, diarrhea, abdominal pain.  GENITOURINARY: Denies dysuria, hematuria.  ENDOCRINE: Denies nocturia or thyroid problems.   HEMATOLOGY AND LYMPHATIC:  Denies easy bruising, bleeding.  SKIN: Denies rash or lesions.  MUSCULOSKELETAL: Positive for back pain as described above. Denies any neck, shoulder, knee, or hip pain.  NEUROLOGIC: Denies paralysis, paresthesias.  PSYCHIATRIC: Denies anxiety or depressive symptoms.   Otherwise, full review of systems performed by me is negative.   PAST MEDICAL HISTORY: Obstructive sleep apnea on CPAP therapy, chronic kidney disease, degenerative joint disease, history of CVA with residual  left-sided weakness, seizure disorder as well as hypertension.   SOCIAL HISTORY: Remote tobacco usage. Denies any alcohol or drug usage. Uses a walker for ambulation.   FAMILY HISTORY: Positive for coronary artery disease.   ALLERGIES: CAPSAICIN TOPICAL, CODEINE, HYDROCODONE, OXYCODONE, TRAMADOL, AS WELL AS VICODIN.   HOME MEDICATIONS: Include aspirin 81 mg p.o. daily, fentanyl patch 50 mcg q.72 hours, Tylenol 325 two tabs every 4 hours as needed for pain or fever, Vasotec 10 mg p.o. b.i.d., clonidine 0.3 mg p.o. b.i.d., diltiazem 240 mg p.o. b.i.d., phenytoin 100 mg 2 capsules p.o. at bedtime, Remeron 30 mg p.o. daily, Zofran 4 mg p.o. every 12 hours as needed for nausea or vomiting, alprazolam 0.5 mg p.o. b.i.d. as needed for anxiety, alprazolam 0.5 mg half tablet at nighttime, Coreg 3.125 mg p.o. at bedtime, lidocaine 5% topical to right hip once daily, Lasix 20 mg p.o. every other day, iron at 325 mg p.o. 3 times daily, Colace 100 mg p.o. every other day at bedtime, Skelaxin 800 mg p.o. q. 8 hours, hydralazine 50 mg p.o. at bedtime, 75 mg in the morning and afternoon.   PHYSICAL EXAMINATION: GENERAL: Well-nourished, well-developed, Caucasian female, currently in no acute distress.  HEAD: Normocephalic, atraumatic.  EYES: Pupils equal, round, and react to light. Extraocular muscles intact. No scleral icterus.  MOUTH: Moist mucosal membrane. Dentition intact. No abscess noted.  EARS, NOSE, AND THROAT: Clear without exudates. No external lesions.  NECK: Supple. No thyromegaly. No nodules. No JVD.  PULMONARY: Clear to auscultation bilaterally without wheezes, rubs or rhonchi. No use of accessory muscles. Good respiratory effort.  CHEST: Nontender to palpation.  CARDIOVASCULAR: S1, S2, regular rate and rhythm. No murmurs, rubs, or gallops. No edema. Pedal pulse 2+ bilaterally.  GASTROINTESTINAL: Soft,  nontender, nondistended. No masses. Positive bowel sounds. No hepatosplenomegaly.   MUSCULOSKELETAL: No swelling, clubbing, or edema. Range of motion limited in lower extremity proximal flexion and extension secondary to pain.   NEUROLOGIC: Cranial nerves II-XII intact. No gross focal neurological deficits. Sensation intact. Reflexes intact. Distal strength in both lower extremities 5/5 for flexion and extension, however proximal strength is limited secondary to pain.  SKIN: No ulcerations, lesions, rash, cyanosis. Skin warm, dry. Turgor intact.  PSYCHIATRIC: Mood and affect within normal limits. The patient is awake, alert, oriented x 3. Insight and judgment intact.   LABORATORY DATA: Sodium 134, potassium 4.8, chloride 101, bicarbonate 27, BUN 31, creatinine 2.31, glucose 108. Calcium 11.9, albumin 3.3, corrected calcium of 12.5. TSH 1.56. WBC 12.7, hemoglobin 11.6, platelets 300,000. Urinalysis negative for evidence of infection.   RADIOLOGICAL DATA:  Chest x-ray performed, stable left basilar infiltrates. X-ray of the lumbar spine reveals diffuse degenerative changes as well as a chronic L1 compression fracture. MRI of the spine performed, revealing severe multilevel degenerative disc disease and facet arthrosis, most prevalent at L1-L2, L2-L3.  However, there is no mention of nerve or cord impingement.   ASSESSMENT AND PLAN: An 81 year old Caucasian female presenting with back pain and inability to ambulate.  1. Back pain. MRI findings as above. Provide pain control.  Initiate bowel regimen and physical therapy consult.  2. Hypertension. Add p.r.n. hydralazine. Continue her home medications.  3. Leukocytosis. No evidence of infection. No indication for antibiotics at this time.  4. Hypercalcemia corrected to 12.5 for albumin. IV fluid hydration with normal saline. Check a vitamin D level as well as parathyroid levels. Hold any supplementation at this time.  5. Venous thromboembolism prophylaxis with heparin subcutaneously.   CODE STATUS: The patient is full code.   TIME  SPENT: 45 minutes.    ____________________________ Cletis Athensavid K. Hower, MD dkh:dd D: 11/24/2013 01:10:44 ET T: 11/24/2013 04:37:22 ET JOB#: [id number]  cc: Cletis Athensavid K. Hower, MD, <Dictator> DAVID Synetta ShadowK HOWER MD ELECTRONICALLY SIGNED 11/25/2013 1:34

## 2014-11-24 NOTE — Discharge Summary (Signed)
PATIENT NAME:  Kristina Wilson, Kristina Wilson DATE OF BIRTH:  [birth year]  DATE OF ADMISSION:  03/07/2014 DATE OF DISCHARGE:  03/09/2014  ADMITTING DIAGNOSES: Weakness, difficulty with ambulation.   DISCHARGE DIAGNOSES: 1. Weakness likely due to a combination of hypercalcemia related to adrenal insufficiency.  2. Hypercalcemia, likely due to calcium ingestion. Supplements. Her PTH was normal. Needs outpatient follow-up on her calcium level.  3. Relative adrenal insufficiency in light of chronic steroid use with a quick taper. The patient back on prednisone.  4. Chronic sciatica.  5. Hypertension.  6. Gastroesophageal reflux disease. 7. History of seizure disorder.  8. Anemia of chronic disease.  9. Chronic kidney disease.  10. Previous history of cerebrovascular accident.  11. Hypertension.   PERTINENT LABORATORY DATA AND EVALUATIONS: Sodium 135, potassium 3.9, chloride 99, bicarbonate 26, BUN 24, creatinine 2.15, glucose 137. Troponin less than 0.02. WBC 7.7, hemoglobin 9.1, platelet count 246,000. Chest x-ray showed vascular congestion. CT scan of the head showed no acute abnormality. Most recent BUN and creatinine: BUN is 9 and 1.95. Hemoglobin is 8.4 at discharge. Calcium level today is 9.9.   HOSPITAL COURSE: Please refer to H and P done by the admitting physician. The patient is an 81 year old white female with multiple medical problems, who was recently indicated skilled nursing facility at discharge about 60 days ago, who presented to the hospital with generalized weakness and fatigue. And was seen in the  in the edition she was noted to have hyperkalemia. The patient also was feeling very weak. She also was recently discontinued on her prednisone that she was on. She was on prednisone since April. There was concern for adrenal insufficiency as well. The patient was started on IV steroids. Given IV fluids for hypercalcemia, she had received subsequent calcitonin. She does take  calcium supplements. That is what the reason was for her hypercalcemia. Her PTH level was normal. The patient initially was interested in going to rehab; however. There was some copay associated with the rehab. Show the family decided to take her home. At this time, she is walked by PT and is stable for discharge.   DISCHARGE MEDICATIONS: Clonidine 0.3 mg 1 tab p.o. b.i.d., Protonix 40 mg 1 tab p.o. b.i.d., diltiazem 240 mg 1 tab p.o. b.i.d., meloxicam 800 mg 1 tablet p.o. q.8, aspirin 81 mg 1 tablet p.o. daily, gabapentin 100 mg p.o. t.i.d., Lidocaine apply 1 patch every 12 hours, carvedilol 6.25 mg 1 tablet p.o. b.i.d., alprazolam 0.25 mg at bedtime, Colace 100 mg 1 tab p.o. at bedtime, iron sulfate 325 mg 1 tab p.o. t.i.d., Lasix 20 daily, mirtazapine 15 daily, Dilantin 400 mg at bedtime, fentanyl 50 mcg daily, prednisone 10 mg 1 tab p.o. daily, hydralazine 100 mg t.i.d., Tylenol 650 q.4 p.Wilson.n. for pain.   DIET: Low-sodium, low-fat, low-cholesterol.   ACTIVITY: As tolerated. PT evaluation and treatment.   FOLLOWUP: Follow with primary MD in 1 to 2 weeks. The patient referred for physical therapy and nurse at home.    TIME SPENT: 35 minutes. Thank you.     ____________________________ Lacie ScottsShreyang H. Allena KatzPatel, MD shp:lm D: 03/09/2014 14:38:46 ET T: 03/10/2014 01:20:42 ET JOB#: [id number]  cc: Hayden Kihara H. Allena KatzPatel, MD, <Dictator> Charise CarwinSHREYANG H Joani Cosma MD ELECTRONICALLY SIGNED 03/14/2014 10:39

## 2014-11-24 NOTE — Discharge Summary (Signed)
PATIENT NAME:  Kristina Wilson, Kristina Wilson MR#:  [id number] DATE OF BIRTH:  02-Jul-1923  DATE OF ADMISSION:  11/24/2013 DATE OF DISCHARGE:  11/27/2013  CONSULTANTS: Dr. Hyacinth MeekerMiller from orthopedics, Dr. Bluford Kaufmannh from GI and Dr. Wynelle LinkKolluru and Cherylann RatelLateef from nephrology.   CHIEF COMPLAINT: Back pain.  PRIMARY CARE PHYSICIAN: Dewaine Oatsenny Tate, MD   DISCHARGE DIAGNOSES: 1.  Acute on chronic back pain due to severe degenerative multilevel disk disease. 2.  Malignant essential hypertension.  3.  Acute on chronic renal disease, stage IV, now improved.  4.  Hyperkalemia.  5.  Seizure disorder.  6.  Hypercalcemia, chronic. 7.  Chronic anemia.  8.  Generalized weakness.  9.  Possible duodenal ulcer.  10.  Obesity.  11.  History of obstructive sleep apnea, on CPAP.  12.  Chronic kidney disease.  13.  History of stroke with residual left-sided weakness.   DISCHARGE MEDICATIONS: Phenytoin 100 mg extended-release 2 caps once a day at bedtime, ferrous sulfate 325 mg 3 times a day, diltiazem extended-release 240 mg 2 times a day, Carvedilol 3.125 mg once a day at bedtime, lidocaine topical film 5% applied to right hip once a day, Skelaxin 800 mg every 8 hours, clonidine 0.3 mg 2 times a day, ondansetron 4 mg every 12 hours as needed for nausea and vomiting, hydralazine 50 mg take 1-1/2 tabs 2 times a day in the morning and afternoon and Lasix 20 mg once every other day, Tylenol 325 mg 2 tabs every 4 hours as needed for pain or fever, Colace 100 mg at bedtime every other day, aspirin 81 mg daily, Vasotec 10 mg 2 times a day, Remeron 30 mg once a day, fentanyl patch every 3 days 50 mcg/film, alprazolam 0.5 mg once a day at bedtime, hydralazine 50 mg at bedtime, alprazolam 0.5 mg every 12 hours as needed, pantoprazole 40 mg 2 times a day, prednisone 20 mg for a day, then 10 mg for a day, then stop.   DISCHARGE DIET: Low-sodium, low-fat, low-cholesterol.   DISCHARGE ACTIVITY: As tolerated.   DISCHARGE FOLLOWUP: Please follow with PCP.  Please follow with orthopedics within 1 to 2 weeks. Please follow with GI within 1 to 2 weeks. Please follow with nephrology within 1 to 2 weeks. Check BMP for potassium on the 28th.   SIGNIFICANT DIAGNOSTIC DATA: Initial BUN 31, creatinine 2.31, last creatinine 1.75. Initial calcium 11.9. Initial white count 12.7. PTH 24. Vitamin D 25-hydroxy serum is 41.1.   Chest x-ray, PA and lateral: Left basilar infiltrate. Stable from multiple previous exams.   Lumbar AP and lateral x-ray: Diffuse degenerative and postsurgical changes, chronic L1 compression deformity.   MRI of the lumbar spine without contrast showing levoscoliosis of the lumbar spine with associated severe multilevel degenerative disk disease and facet arthrosis most prevalent at L1-L2 and L2-L3 where there is moderate canal stenosis, multifactorial degenerative changes at L2-L3 with severe right and mild left foraminal stenosis, multifactorial degenerative changes at L3-L4 with severe left and moderate right frontal stenosis, remote compression deformity of L1 with 60% of height loss, no retropulsion, no acute fracture. Right paracentral disk protrusion T11-T12 without associated stenosis, postop changes L3-L4 and L4-L5, grade 1 anterolisthesis of L4 and L5 with superimposed left foraminal disk protrusion impinging upon existing left L4 nerve roots on the left L4-L5 neural foramen.  HISTORY OF PRESENT ILLNESS AND HOSPITAL COURSE: For full details of H and P, please see the dictation on April 24 by Dr. Clint GuyHower, but briefly this is an 81 year old obese female  with history of significant disk disease, history of stroke, and chronic back pain who came in for acute on chronic worsening back pain for a few days, 10 out of 10, and was admitted to the hospitalist service. A MRI was done. The patient was seen by orthopedics, Dr. Hyacinth Meeker. For the patient's back pain, the patient was started on prednisone taper and analgesics as needed. A corset was ordered.  PT for ambulation was started, and the patient was seen by physical therapy. At this point, pain is significantly better. Outpatient pain management consult should be made for possible epidural injection. With the prednisone taper, the patient has significantly improved. She will be going to rehab. The patient did have acute on chronic renal failure as well on admission and that has significantly improved at this point. The patient did have some hyperkalemia today and was given 30 mg of Kayexalate and a potassium will be checked in the next hour. In regards to her seizure disorder, her Dilantin was continued. The patient does appear to have chronic hypercalcemia and work-up is pending per nephrology, but it is not completed. She should follow with nephrology. An appointment will be made for further follow-up. The patient on CAT scan was found to have some duodenal findings. A CAT scan was performed of the abdomen and pelvis which showed mild thickening of the duodenum wall with some peri-duodenal stranding. For this GI was consulted who thought perhaps this might be a duodenal ulcer. She will be discharged on PPI twice daily and was instructed to follow with GI as an outpatient for evaluation for endoscopy and colonoscopy. The patient is FULL code.   TOTAL TIME SPENT: 40 minutes. ____________________________ Krystal Eaton, MD sa:sb D: 11/27/2013 16:20:30 ET T: 11/27/2013 16:36:42 ET JOB#: [id number]  cc: Krystal Eaton, MD, <Dictator> Jillene Bucks. Arlana Pouch, MD Valinda Hoar, MD Munsoor Lizabeth Leyden, MD Krystal Eaton MD ELECTRONICALLY SIGNED 11/30/2013 14:35

## 2014-11-24 NOTE — Consult Note (Signed)
Brief Consult Note: Diagnosis: Severe back pain.   Patient was seen by consultant.   Recommend to proceed with surgery or procedure.   Recommend further assessment or treatment.   Orders entered.   Discussed with Attending MD.   Comments: 81 year old female seen by me yesterday and admitted for severe back pain for several days now preventing her from walking at home.  Has multiple medical problems as well.  MRI shows severe scoliosis, old L1 compression fracture, severe degenerative disc disease throughtout the lumbar spine, old laminectomy changes L4-5 with 1st degree spondylollisthesis, and left sided disc protrusion at that level.  Patient complains of back pain and weakness in legs. Denies bowel/bladder problems.   Exam: alert, in mild distress.  straight leg raise negative.  circulation/sensation/motor function good distally  with normal strength.  range of motion hips and knees good.  Back tender to palpation.  Unable to stand today.    X-rays: as above  Imp: Severe back pain associated with advanced degenerative changes and disc protrusion.  Rx:  Oral prednisone taper        Analgesics as needed       LS corset       Physical Therapy consult for ambulation       Short term rehabilitation facility       Consult pain management for possible epidural injection.  Electronic Signatures: Valinda HoarMiller, Amberlin Utke E (MD)  (Signed 25-Apr-15 17:32)  Authored: Brief Consult Note   Last Updated: 25-Apr-15 17:32 by Valinda HoarMiller, Erandi Lemma E (MD)

## 2014-11-24 NOTE — Op Note (Signed)
PATIENT NAME:  Kristina Wilson, Kristina Wilson MR#:  [id number] DATE OF BIRTH:  [birth year]  DATE OF PROCEDURE:  07/18/2013  PREOPERATIVE DIAGNOSES:  1.  Critical stenosis of the left internal carotid artery.  2.  Transient ischemic attack involving the left hemisphere.  3.  Hypertension.   POSTOPERATIVE DIAGNOSES: 1.  Critical stenosis of the left internal carotid artery.  2.  Transient ischemic attack involving the left hemisphere.  3.  Hypertension.   PROCEDURE: 1.  Arch aortogram.  2.  Selective injection of the left common carotid, cervical imaging.   SURGEON: Levora DredgeGregory Schnier, M.D.   SEDATION: Versed 3 mg plus fentanyl 100 mcg administered IV. Continuous ECG, pulse oximetry and cardiopulmonary monitoring is performed throughout the entire procedure by the interventional radiology nurse. Total sedation time was 1 hour.   ACCESS: A 5-French sheath, right common femoral artery.   FLUOROSCOPY TIME: 9.9 minutes.   CONTRAST USED: Isovue 48 mL.   INDICATIONS: [name] is an 81 year old woman who presented to the hospital after an episode of dysarthria and garbled speech as well as visual changes. Workup has demonstrated critical stenosis of the left internal carotid artery. The lesion is quite calcified and duplex has suggested a high-grade stenosis; however, given the patient's severe renal insufficiency, it is not feasible to obtain a CT angiography exam. She is, therefore, undergoing selective injection of her carotid system to minimize her contrast exposure. Risks and benefits were reviewed in detail, all questions answered. The patient has agreed to proceed.   DESCRIPTION OF PROCEDURE: The patient is taken to special procedures and placed in the supine position. After adequate sedation is achieved, both groins are prepped and draped in sterile fashion. Ultrasound is placed in a sterile sleeve. Ultrasound is utilized secondary to lack of appropriate landmarks and to avoid vascular injury. Under  direct ultrasound visualization, the common femoral artery is identified. It is echolucent and pulsatile, indicating patency. Image is recorded for the permanent record. Lidocaine 1% is infiltrated in the soft tissues and access is obtained to the anterior wall of the common femoral artery with a micropuncture needle and microwire followed by microsheath, J-wire followed by a 5-French sheath. A 5-French pigtail catheter is advanced into the ascending aorta and LAO projection of the aortic arch is obtained. After review of the images, stiff angled Glidewire is used to exchange the pigtail catheter for  first an H1 and then a JB-1. Neither of these would engage and track into the left common carotid secondary to tortuosity. Therefore, a floppy glide was selected and, although the JB-1 would not track, the H1 did track into the midportion of the common carotid.  It would not track any further given the rather marked redundancy. On several occasions, the catheter backed out and popped into the ascending aorta. Trying to get it to advance any farther.   With the catheter positioned, the carotid bifurcation was imaged in the AP, direct lateral, as well as an LAO and an RAO projection. After review of the images, the catheter is removed. J-wire is advanced through the sheath and a Perclose device is deployed after an RAO projection of the groin is obtained. There are no immediate complications. The patient has done well throughout the entire procedure.   INTERPRETATION: The aortic arch is opacified with a bolus injection of contrast. There appears to be approximately a 50% stenosis of the origin of the left subclavian. There is mild atherosclerotic plaque noted at the origin of the innominate but is not  hemodynamically significant. Origin of the left common carotid is widely patent. Left common carotid is also rather markedly redundant or tortuous. It is a type 2 arch.   The common carotid and bulb are widely patent  in all views. External carotid artery demonstrates moderate plaque at its origin. The internal carotid artery demonstrates a very bulky, very densely calcified plaque. This is not a focus-narrowed lesion that provides a string sign, rather a very irregular, densely calcified lesion which appears to obstruct blood flow throughout the entire lumen, allowing multiple little channels for the blood to pass. In 1 image in the AP projection, it appears on a direct measurement to be at least a 65% to 70% stenosis. However, given all of the images examined essentially simultaneously, there clearly is a high-grade 80% to 90% stenosis of the internal carotid artery which is quite irregular and densely calcified. The more distal internal carotid artery is widely patent.   SUMMARY: There is a heavily calcified, very irregular, high-grade stricture stenosis at the origin of the internal carotid artery. This appears to be a critical lesion. Given her left hemispheric symptoms, I believe treatment is warranted. Given the difficulty in positioning a diagnostic catheter as well as the very dense calcifications at the bifurcation and the patient's age, I do not recommend stenting for the treatment of this lesion as I think that would have a higher risk. At this time, I will talk with Dr. Arlana Pouch. The patient should undergo pulmonary and cardiac clearance and, if cleared, then standard carotid endarterectomy should be performed. This was discussed with the patient.   ____________________________ [name], MD ggs:cs D: 07/19/2013 16:59:01 ET T: 07/19/2013 20:40:22 ET JOB#: [id number]  cc: [name], MD, <Dictator> Jillene Bucks. Arlana Pouch, MD [name] MD ELECTRONICALLY SIGNED 08/08/2013 19:29

## 2014-12-02 NOTE — Consult Note (Signed)
   Comments   I met with pt's daughter/HCPOA. Updated her on pt's current condition. Pt now out of CCU and on 1C. I discussed code status with daughter. She says that her mother has told her that she wants to remain a full code and daughter respects pt's decision. Daughter understands that it pt is intubated, it may be difficult to extubate her. Daughter understands and would probably not choose longterm vent care for pt.  remains a full code.   Electronic Signatures: Stephaniemarie Stoffel, Izora Gala (MD)  (Signed 04-Apr-16 11:50)  Authored: Palliative Care   Last Updated: 04-Apr-16 11:50 by Milissa Fesperman, Izora Gala (MD)

## 2014-12-02 NOTE — H&P (Signed)
PATIENT NAME:  Kristina Wilson, Ludell R MR#:  [id number] DATE OF BIRTH:  1923-01-10  DATE OF ADMISSION:  11/03/2014  PRIMARY CARE PHYSICIAN:   Dr. Arlana Pouchate  CHIEF COMPLAINT:  Shortness of breath, cough, and generalized weakness for 4 to 5 days.   HISTORY OF PRESENT ILLNESS: An 81 year old Caucasian female with a history of CKD, CVA, hypertension, that was sent to ED from home due to cough, sputum, shortness of breath, and generalized weakness for the past 4 to 5 days. The patient is alert, awake, but lethargic. According to the patient's daughter, and the patient, the patient has been feeling sick for the past 4 to 5 days. The patient has a cough, sputum, shortness of breath, and generalized weakness. In addition, the patient has a poor appetite, feels cold and shivering. The patient was diagnosed with  pneumonia 4 days ago, was discharged from ED.   She has been taking Levaquin 2 doses for the past 4 days.  Symptoms have been worsening, so she came back to ED for further evaluation. Chest x-ray showed left lower lobe pneumonia.  The patient has chronic bilateral lower extremity edema. The patient denies any other symptoms.   PAST MEDICAL HISTORY:  1. CKD.   2. Obstructive sleep apnea on chronic CPAP therapy.  3. CVA. 4. Seizure disorder.  5. Hypertension.   SOCIAL HISTORY: No smoking or drinking or illicit drugs. Living with her daughter.   FAMILY HISTORY: Coronary artery disease.   ALLERGIES: CAPSAICIN TOPICAL, CODEINE, ENALAPRIL, HYDROCODONE, OXYCODONE, TRAMADOL, VICODIN  HOME MEDICATIONS:  Medication reconciliation list is not done yet.   REVIEW OF SYSTEMS:   CONSTITUTIONAL: The patient.   PAST SURGICAL HISTORY:  Had back surgery, cataract extraction, D and C, tonsillectomy, joint replacement, lumpectomy, appendectomy, bilateral knee surgery, carotid endarterectomy on   the right side.   REVIEW OF SYSTEMS:   CONSTITUTIONAL:  The patient has a fever, chills, and generalized weakness, poor oral  intake and dizziness, no headache.  EYES: No double vision or blurred vision.  EARS, NOSE, AND THROAT: No postnasal drip, slurred speech or dysphagia.  CARDIOVASCULAR:  No chest pain, palpitation, orthopnea, or nocturnal dyspnea, but has leg edema.  PULMONARY: Positive for cough, sputum, shortness of breath, but no hemoptysis. No wheezing.  GASTROINTESTINAL: No abdominal pain, nausea, vomiting, diarrhea. No melena or bloody stool.  GENITOURINARY: No dysuria, hematuria, or incontinence.  SKIN: No rash or jaundice.  NEUROLOGY: No syncope, loss of consciousness, or seizure, but has generalized weakness and fall at home.  HEMATOLOGY:  No easy bruising or bleeding.  ENDOCRINOLOGY: No polyuria or polydipsia, heat or cold intolerance.   PHYSICAL EXAMINATION: VITAL SIGNS: Temperature 97.7, blood pressure 124/33, pulse 73, oxygen saturation 92% on room air.  GENERAL: The patient is alert, awake, oriented but looks lethargic.  HEENT: Pupils round, equal and reactive to light and accommodation.  Moist oral mucosa.  Clear pharynx.   NECK:   Supple.  No JVD or carotid bruit. No lymphadenopathy. No thyromegaly.  CARDIOVASCULAR: S1 and S2. Regular rate and rhythm. No murmurs or gallops.  PULMONARY: Bilateral air entry. No wheezing or use of accessory muscle to breathe but has some crackles on the left side.  EXTREMITIES: No edema.  ABDOMEN: Soft. No distention or tenderness. No organomegaly. Bowel sounds present.  EXTREMITIES: Bilateral lower extremity edema 1+.  2 ulcers in dressing and the 1 laceration wound with some redness and warmness but no tenderness. Bilateral pedal pulses present. According to the patient's daughter, this ulcer is  due to a fall.   NEUROLOGY: A and O x3. No focal deficits. Lower extremity power about 1 out of 5.  Sensation intact.   LABORATORY DATA: Glucose 105, BUN 39, creatinine 2.38. Electrolytes normal. WBC 9.7, hemoglobin 9.5, platelets 256. Chest x-ray showed left lower lobe  infiltrate. Urinalysis is negative.  EKG shows normal sinus rhythm at 64 BPM.   IMPRESSIONS: 1. Left lower lobe pneumonia.  2. Acute renal failure and chronic kidney disease.   3. Hypertension.  4. Obstructive sleep apnea.   5. History of CVA and seizure disorder.  6. Anemia.   PLAN OF TREATMENT:   1. The patient will be admitted to medical floor and the patient was treated with Zithromax and Rocephin.  We will continue the same and follow up blood culture, sputum culture and CBC and get a nebulizer treatment and oxygen.  2. For acute renal failure on CKD, I gave gentle rehydration. Follow up  BMP and magnesium level.  3. For hypertension, we will continue the patient's home hypertension medication. 4. I discussed the patient's condition and plan of treatment with the patient and the patient's daughter.   The patient's daughter said the patient is a full code.   TIME SPENT:  About 62 minutes.    ____________________________ Shaune Pollack, MD qc:tr D: 11/03/2014 13:13:23 ET T: 11/03/2014 13:45:14 ET JOB#: [id number]  cc: Shaune Pollack, MD, <Dictator> Shaune Pollack MD ELECTRONICALLY SIGNED 11/03/2014 17:06

## 2014-12-02 NOTE — Discharge Summary (Signed)
PATIENT NAME:  Kristina Wilson, Kristina Wilson MR#:  [id number] DATE OF BIRTH:  1922/12/25  DATE OF ADMISSION:  11/19/2014 DATE OF DISCHARGE: 11/21/2014   ADMISSION DIAGNOSIS: Acute hypoxic respiratory failure secondary to healthcare associated pneumonia.   DISCHARGE DIAGNOSES:  1. Acute hypoxic respiratory failure secondary to acute diastolic acute heart failure, as well as healthcare associated pneumonia.  2. Acute and chronic anemia.  3. Chronic kidney disease stage III.  4. Elevated troponins.  5. accelerated Essential hypertension.  6. Acute diastolic heart failure.  7. Seizure disorder.   CONSULTATIONS: None.   PHYSICAL EXAMINATION AT DISCHARGE:  VITAL SIGNS: The patient is afebrile, temperature 97.5, pulse 62, respirations 20, blood pressure 148/70, 99% on 2 liters.  GENERAL: The patient is alert and denies acute distress.  HEENT: Head is atraumatic. Pupils are oriented.  LUNGS: Clear to auscultation. There are no crackles, rales, rhonchi, or wheezing.  CARDIOVASCULAR: Regular rate and rhythm. No murmurs, gallops, or rubs. Hard to palpate PMI.  ABDOMEN: Bowel sounds positive. Nontender, nondistended.  EXTREMITIES: She has some tenderness at the lower extremities and very minimal edema.   PERTINENT LABORATORIES AT DISCHARGE: Sodium 134, potassium 5.0, chloride 106, bicarbonate 25, BUN 53, creatinine 2.14, glucose is 92. White blood cells 9.5, hemoglobin 18, hematocrit 25, platelets are 259.   HOSPITAL COURSE: This is a very pleasant 81 year old female with a history of obstructive sleep apnea, who presented with hypoxia, found to have a pneumonia on chest x-ray. For further details, please refer to the H and P.    1. Acute hypoxic respiratory failure from healthcare acquired pneumonia, as well as acute diastolic heart failure. The patient was placed on broad-spectrum antibiotics including Zosyn and Levaquin. She was continued on oxygen, which she does use at the nursing facility. We obtained  a VQ scan and lower extremity Dopplers, which were negative for PE. The patient did have elevated d-dimer admission.  2. Acute on chronic anemia. The patient with small amount of hemoptysis while she was on a heparin drip to evaluate for pulmonary emboli. She was transfused 1 unit. She does have chronic anemia and sees Dr. Sherrlyn Hock.  3. Chronic kidney disease. She is at her baseline.  4. Elevated troponin from demand ischemia. Her troponins were flat.  5. Accelerated Essential hypertension. The patient's blood pressure was elevated while in the hospital. We increased her hydralazine and she will need close follow up for her blood pressure as an outpatient.This was in part from anxiety and stress. She has had issues with her blood pressure as an outpatient as well. It has been difficult to control.  Imdur can be added if necessary. 6. Diastolic heart failure, acute. Her echo showed a preserved ejection fraction with diastolic dysfunction, likely from underlying OSA, as well as hypertension. She is now on Lasix.  7. Seizure disorder.  The patient's Dilantin level was 4.4, so we did increase her Dilantin.  PERTINENT IMAGING:  VQ scan was low probability.  Lower extremity Dopplers were negative for deep vein thrombosis.   A 2-D echocardiogram showed ejection fraction of  60% -65% with diastolic dysfunction.   DISCHARGE MEDICATIONS:  1. Clonidine 0.3 b.i.d.  2. Pantoprazol 40 mg b.i.d.  3. Diltiazem 240 b.i.d.  4. Aspirin 81 mg daily.  5. Gabapentin 100 mg t.i.d.  6. Lidocaine topical patch b.i.d.,  7. Coreg 6.25 b.i.d.  8. Docusate 100 mg at bedtime.  9. Ferrous sulfate 325 t.i.d.  10. CoQ10 300 mg daily.  11. B12 1 tablet daily.  12. Vitamin C 1000 mg daily.  13. Prednisone 10 mg daily.  14. PreserVision 1 capsule b.i.d.  15. Hydralazine 100 mg b.i.d.  16. Metaxalone 800 mg 1/2 tablet every 8 hours.  17. Refresh ophthalmic solution 2 drops affected eye 4 times a day.  18. Voltaren  topically 4 times a day.  19. Fentanyl 50 mcg every 72 hours.  20. Phenytoin 300 mg at bedtime.  21. Lasix 40 mg daily.  22. Levaquin 750 mg every 48 hours x5 days starting 11/22/2014.  DISCHARGE OXYGEN: 2 liters nasal cannula.   DISCHARGE DIET: Low sodium.   DISCHARGE REFERRAL: Physical therapy.   FOLLOWUP: The patient will need to follow up with Dr. Dewaine Oatsenny Tate in 1 week. Blood pressures need to be monitored, as well as a repeat Dilantin level.   The patient was stable for discharge. Plan of care was discussed with the patient and family  ____________________________ Reveca Desmarais P. Juliene PinaMody, MD spm:AT D: 11/21/2014 10:10:04 ET T: 11/21/2014 11:28:15 ET JOB#: [id number]  cc: Vyctoria Dickman P. Juliene PinaMody, MD, <Dictator> Janyth ContesSITAL P Trenna Kiely MD ELECTRONICALLY SIGNED 11/21/2014 21:34

## 2014-12-02 NOTE — Discharge Summary (Signed)
PATIENT NAME:  Kristina Wilson, [name] MR#:  [id number] DATE OF BIRTH:  Mar 15, 1923  DATE OF ADMISSION:  11/03/2014 DATE OF DISCHARGE:  11/07/2014  ADMISSION DIAGNOSES:  1.  Left lower lobe pneumonia.  2.  Acute on chronic renal failure.   DISCHARGE DIAGNOSES: 1.  Acute on chronic respiratory failure with hypercarbia.  2.  Community-acquired pneumonia, left lower lobe.  3.  Essential hypertension.  4.  Seizure disorder.  5.  History of obstructive sleep apnea.  6.  Disorientation from hypercarbia.  7.  Anemia of chronic disease.  8.  Chronic kidney disease stage 3, acute on chronic.   CONSULTATIONS: Palliative care.   PHYSICAL EXAMINATION: VITAL SIGNS: Temperature 98, pulse 66, respirations 18, blood pressure 133- 159/67-68, 95% on 2 liters.  GENERAL: The patient is alert and oriented in no acute distress.  CARDIOVASCULAR: Regular rate and rhythm. No murmurs, gallops, rubs. PMI was hard to palpate due to body habitus.  LUNGS: She has crackles actually at the right and the left lung base. No wheezing, rhonchi, or rales heard.  ABDOMEN: Obese, bowel sounds positive. Hard to appreciate organomegaly due to body habitus.  EXTREMITIES: No clubbing, cyanosis or edema.   LABORATORY DATA: Sodium 134, potassium 4.1, chloride 104, bicarbonate 22, BUN 44, creatinine 2.90, glucose is 99.   Blood cultures negative to date.   HOSPITAL COURSE: This is a very pleasant 81 year old female who presented on 11/03/2014 with shortness of breath, cough, and generalized weakness. Had previously been diagnosed with a pneumonia and was admitted with left lower lobe pneumonia. For further details, please refer to the H and P.  1.  Acute on chronic respiratory failure with hypercarbia. The patient has clinically improved. She has not worn her CPAP machine for some time now due to issues with getting into her bed and at home she has been sleeping in her recliner. We performed an ABG as she was lethargic on admission  and she did have evidence of hypercarbia. This has now resolved and she is much improved.  2.  Community-acquired pneumonia, left lower lobe. She was sent home on Levaquin but did not respond adequately. She was on azithromycin, Rocephin and did respond here. I suspect also she has actually a bilateral pneumonia because on clinical examination at the right base she also has some crackles. She is improved clinically.  3.  Essential hypertension. She will continue on her outpatient medications.  4.  History of seizure disorder. Will continue on Dilantin. Her Dilantin level was slightly low but she has been doing well on this dose of Dilantin so we did not change.  5.  History of obstructive sleep apnea. She needs to continue to use her CPAP at home.  6.  Disorientation from hypercarbia, which improved.  7.  Anemia of chronic disease. The patient will continue iron sulfate.  8.  Acute on chronic kidney disease. Baseline creatinine is 2.1 to 2.4. She does have a followup appointment with nephrology next week. It does appear that she may have acute tubular necrosis from her acute on chronic respiratory failure.  9.  Swallowing issues. The patient was seen by speech pathologist. At this time, speech recommends pureed thin liquids with strict aspiration precautions.   DISCHARGE MEDICATIONS: 1.  Clonidine 0.3 b.i.d.  2.  Pantoprazole 40 mg b.i.d.  3.  Diltiazem 240 mg b.i.d.  4.  Metaxalone 800 mg q. 8 hours.  5.  Aspirin 81 mg daily.  6.  Gabapentin 100 t.i.d.  7.  Lidocaine  patch applied topically 2 times a day as needed for pain.  8.  Coreg 6.25 b.i.d.  9.  Docusate 100 mg at bedtime.  10.   Ferrous Sulfate 325 t.i.d.  11.   CoQ10 300 mg daily.  12.   Vitamin B12 one tablet daily.  13.   Vitamin C 1000 mcg daily.  14.   Prednisone 10 mg daily.  15.   PreserVision 1 tablet b.i.d.  16.   Dilantin 2 tablets at bedtime.  17.   Hydralazine 50 mg 1.5 tablets b.i.d.  18.   Fentanyl patch 50 mcg for  72 hours.  19.   Azithromycin 500 mg for 5 days.   DISCHARGE DIET: Low-sodium, pureed, thin liquids, strict aspiration precautions.   DISCHARGE OXYGEN: Two liters nasal cannula p.r.n. keeping saturations 90% to 92%. The patient will need a CPAP machine at night.   FOLLOWUP: The patient will follow up with Dr. Arlana Pouch in 1 week, with nephrology in 1 week.   Please avoid nephrotoxic agents. The patient needs her CPAP at night.  She will need to work with speech and PT The patient is stable for discharge. The plan of care was discussed with the patient and patient's daughter.   TIME SPENT: Approximately 45 minutes.    ____________________________ Janyth Contes. Juliene Pina, MD spm:at D: 11/07/2014 09:18:42 ET T: 11/07/2014 09:45:59 ET JOB#: [id number]  cc: Josehua Hammar P. Juliene Pina, MD, <Dictator> Janyth Contes Stephine Langbehn MD ELECTRONICALLY SIGNED 11/07/2014 11:39

## 2014-12-02 NOTE — Discharge Summary (Signed)
PATIENT NAME:  Kristina Wilson, Kristina Wilson MR#:  [id number] DATE OF BIRTH:  10/03/1922  DATE OF ADMISSION:  11/19/2014 DATE OF DISCHARGE:  11/22/2014  ADDENDUM  The patient was kept in the hospital due to elevated blood pressure. Her blood pressure has been in general difficult to control even in the past when she has been in the hospital. We did initially add Imdur 120 and Norvasc 10 mg; however, it did drop her blood pressure. Therefore, we continuing her outpatient medications as already dictated and will add Norvasc 10 mg daily. She will need continued follow-up for her blood pressure.   PHYSICAL EXAMINATION: VITAL SIGNS: At discharge, temperature 97.9, pulse 71, respirations 24, blood pressure 118/52, 98% on 2 liters.  GENERAL: The patient is alert and oriented, not in acute distress.  CARDIOVASCULAR: Regular rate and rhythm. No murmurs, gallops, or rubs. PMI is not displaced. LUNGS: Clear to auscultation without crackles, rales, rhonchi, or wheezing. Normal to percussion.  ABDOMEN: Obese, soft. Bowel sounds positive. Hard to appreciate organomegaly due to body habitus.  EXTREMITIES: Minimal edema.  TIME SPENT AT DISCHARGE: 40 minutes.   ____________________________ Janyth ContesSital P. Juliene PinaMody, MD spm:sb D: 11/22/2014 11:29:44 ET T: 11/22/2014 11:48:20 ET JOB#: [id number]  cc: Izzak Fries P. Juliene PinaMody, MD, <Dictator> Janyth ContesSITAL P Catheleen Langhorne MD ELECTRONICALLY SIGNED 11/22/2014 15:12

## 2014-12-02 NOTE — H&P (Signed)
PATIENT NAME:  Kristina Wilson, Kristina Wilson MR#:  [id number] DATE OF BIRTH:  Jul 30, 1923  DATE OF ADMISSION:  11/19/2014  PRIMARY CARE PHYSICIAN:  Dr. Dewaine Oats.    EMERGENCY ROOM PHYSICIAN:  Dr. Cyril Loosen.    CHIEF COMPLAINT: Shortness of breath.   HISTORY OF PRESENT ILLNESS: The patient is an 81 year old female presenting to the ED with a chief complaint of shortness of breath. The patient is sent over from [facility]  rehabilitation center for increasing shortness of breath over the past few hours. The patient was coughing and she was having rattling sensation in her chest and tightness in her chest, found to be in mild respiratory distress. The patient uses 2 liters of oxygen at night times, but recently for the past 3 days she has been using 2 liters of oxygen during daytime as well. Recently she was treated with Rocephin and azithromycin for pneumonia. Chest x-ray in the ED still is revealing atelectasis versus pneumonia. D-dimers are quite elevated. The patient has old history of right lower extremity DVT which was treated, apparently not on any anticoagulants. Still complaining of chest heaviness and calf tenderness more on the left lower extremity than on the right. The patient was given antibiotics in the ED. Hospitalist team is called to admit the patient.    PAST MEDICAL HISTORY:  Chronic kidney disease, obstructive sleep apnea on chronic CPAP therapy, history of CVA, history of hypertension, seizure disorder.   PAST SURGICAL HISTORY: Back surgery, cataract extraction, tonsillectomy, joint replacement, lumpectomy, appendectomy, bilateral knee surgery, carotid endarterectomy on the right side.   ALLERGIES:  TO CAPSAICIN TOPICAL, TO CODEINE, ENALAPRIL, HYDROCODONE, OXYCODONE, TRAMADOL, VICODIN.   PSYCHOSOCIAL HISTORY: Currently residing at [facility] rehabilitation. No history of smoking, alcohol, or illicit drug usage.   FAMILY HISTORY:  Coronary artery disease runs in her family.    REVIEW OF  SYSTEMS:  CONSTITUTIONAL: Denies any fever. Complaining of bilateral lower spasms.   EYES:  Denies blurry vision, double vision, glaucoma.  EARS, NOSE, AND THROAT: Denies epistaxis or discharge.  RESPIRATORY: Complaining of cough, has chronic history of obstructive sleep apnea, and also complaining of chest tightness.  CARDIOVASCULAR: No chest pain.  Complaining of chest tightness.  No palpitations, syncope.   GASTROINTESTINAL:  Denies nausea, vomiting, diarrhea, abdominal pain.  GENITOURINARY: No dysuria, hematuria.   GYNECOLOGIC AND BREASTS:  Denies breast mass or vaginal discharge.  ENDOCRINE: Denies polyuria, nocturia, thyroid problems.  HEMATOLOGIC AND LYMPHATIC:  Has anemia of chronic disease. No easy bruising or bleeding.  INTEGUMENTARY: No acne, rash, lesions.  MUSCULOSKELETAL: No joint pain in the neck and back. Denies any gout.  NEUROLOGIC:  Has history of CVA. No ataxia.  PSYCHIATRIC: No ADD, OCD.    HOME MEDICATIONS: Aspirin 81 mg p.o. once daily, Proventil 2 puffs inhalation q. 4 hours as needed, Coreg 6.25 mg p.o. b.i.d., clonidine 0.3 mg 1 tablet p.o. 2 times a day, coenzyme Q 300 mg 1 capsule p.o. once daily, diltiazem 240 mg extended release p.o. 2 times a day, Colace 100 mg 1 capsule p.o. every other day at bedtime, fentanyl 50 mcg per hour transdermal film extended-release to change every 3 days, iron sulfate 325 mg p.o. 3 times a day with meals, gabapentin 100 mg p.o. 3 times a day, hydralazine 50 mg p.o. 3 times a day, lidocaine 5% topical film apply topically 12 hours on, 12 hours off, pantoprazole 40 mg p.o. once daily, phenytoin 100 mg p.o. once a day, prednisone 10 mg once daily, metaxalone 800 mg  half tablet p.o. every 8 hours, vitamin C 1000 mg 1 capsule p.o. once daily, Voltaren topical gel apply to affected area as needed.    PHYSICAL EXAMINATION:    VITAL SIGNS: Temperature 98.6, pulse 60-83, respirations 18-22, blood pressure 173/42, pulse oximetry 98% on 2 liters.   GENERAL APPEARANCE: Not in acute distress. Moderately built and obese.  HEENT: Normocephalic, atraumatic. Pupils are equally reacting to light and accommodation. No scleral icterus. No conjunctival injection. No sinus tenderness. No postnasal drip. Moist mucous membranes.    NECK: Supple. No JVD. No thyromegaly. Range of motion is intact.   LUNGS: Mild to moderate air entry, decreased breath sounds at the bases.  CARDIAC: S1, S2 normal. Regular rate and rhythm.  No murmurs.  GASTROINTESTINAL:  Soft.  Bowel sounds are positive in all 4 quadrants. Nontender, nondistended, obese. No masses felt.  NEUROLOGIC: Awake, alert, oriented x 3.  Cranial nerves II through XII are grossly intact. Motor and sensory are intact. Reflexes are 2 +.  EXTREMITIES: Bilateral calf tenderness is present. Chronic lower extremity edema is present. No cyanosis. No clubbing.  SKIN: Warm to touch. Normal turgor. No rashes. No lesions.  MUSCULOSKELETAL: No joint effusion, tenderness, erythema.  PSYCHIATRIC: Flat mood and affect.   LABORATORY AND IMAGING STUDIES: CK total is 23, CPK-MB 1.9. Troponin less than 0.03. WBC 9.7, hemoglobin 8, hematocrit 25.1, platelets are 328,000. D-dimer is elevated at 2284. VQ scan is ordered which is pending. BNP 190. Glucose 99, BUN 53, creatinine 2.09, sodium 132, potassium 5.3, chloride 102, CO2 of 22. GFR 20. Anion gap is 8. Serum calcium 9.6. Total protein 6.2, albumin 3.0, bilirubin total 0.5. Alkaline phosphatase, AST, and ALT are normal. Chest x-ray portable single view, COPD with mild CHF, there is persistent left lower lobe atelectasis or pneumonia.   ASSESSMENT AND PLAN: An 81 year old female who was recently admitted and treated for pneumonia, is coming to the ED with a chief complaint of shortness of breath, feeling tight in her stress. D-dimer is elevated and chest x-ray shows atelectasis versus pneumonia on the left lower lobe. The patient was found to be hypoxic. She will be  admitted with the following assessment and plan:   1.  Acute hypoxic respiratory failure, probably from pneumonia, cannot rule out pulmonary embolism , underlying congestive heart failure.  We will admit the patient to telemetry. Blood cultures were obtained and empiric antibiotics were started on this patient in the ED, we will continue Zosyn and levofloxacin as the patient was just recently treated with antibiotics for possible healthcare-associated pneumonia.  2.  Elevated D-dimer with hypoxia and old history of deep vein thrombosis associated with chest tightness and heaviness. We will get stat VQ scan as the patient has chronic kidney disease with elevated creatinine, while pending for VQ scan and lower extremity venous Doppler reports we will heparinize the patient with heparin drip.  If VQ scan and lower extremity venous Dopplers are negative will consider discontinuing the heparin drip.  3.  Mild congestive heart failure per chest x-ray. BNP is mildly elevated. I could not appreciate any rhonchi when auscultating the chest.  Will get echocardiogram to evaluate her ejection fraction.   4.  Hyperkalemia. The patient was given Kayexalate in the ED, no acute ST-T wave changes were noticed.  5.  History of chronic kidney disease. Monitor renal function, avoid nephrotoxins, if necessary will order renal ultrasound and involve or else consult nephrology.  6.  Hypertension. We will resume her home medications including  clonidine and hydralazine and up-titrate medication as needed basis.  7.  History of cerebrovascular accident. Continue aspirin and statin.  8.  She is full code. Her daughter is the medical power of attorney. Plan of care discussed in detail with the patient. She verbalized understanding of the plan.   TOTAL TIME SPENT ON ADMISSION: 45 minutes.     ____________________________ Ramonita Lab, MD ag:bu D: 11/19/2014 12:29:00 ET T: 11/19/2014 12:54:52 ET JOB#: [id number]  cc: Ramonita Lab,  MD, <Dictator> Jillene Bucks. Arlana Pouch, MD Ramonita Lab MD ELECTRONICALLY SIGNED 11/24/2014 17:53

## 2014-12-07 ENCOUNTER — Emergency Department: Payer: Medicare Other

## 2014-12-07 ENCOUNTER — Encounter: Payer: Self-pay | Admitting: Emergency Medicine

## 2014-12-07 ENCOUNTER — Emergency Department
Admission: EM | Admit: 2014-12-07 | Discharge: 2014-12-07 | Disposition: A | Discharge status: Home / Self Care | Payer: Medicare Other | Attending: Emergency Medicine | Admitting: Emergency Medicine

## 2014-12-07 DIAGNOSIS — W01198A Fall on same level from slipping, tripping and stumbling with subsequent striking against other object, initial encounter: Secondary | ICD-10-CM | POA: Diagnosis not present

## 2014-12-07 DIAGNOSIS — S0990XA Unspecified injury of head, initial encounter: Secondary | ICD-10-CM | POA: Diagnosis present

## 2014-12-07 DIAGNOSIS — S322XXA Fracture of coccyx, initial encounter for closed fracture: Secondary | ICD-10-CM | POA: Insufficient documentation

## 2014-12-07 DIAGNOSIS — Z7952 Long term (current) use of systemic steroids: Secondary | ICD-10-CM | POA: Diagnosis not present

## 2014-12-07 DIAGNOSIS — S3219XA Other fracture of sacrum, initial encounter for closed fracture: Secondary | ICD-10-CM | POA: Insufficient documentation

## 2014-12-07 DIAGNOSIS — Z79899 Other long term (current) drug therapy: Secondary | ICD-10-CM | POA: Insufficient documentation

## 2014-12-07 DIAGNOSIS — Z7982 Long term (current) use of aspirin: Secondary | ICD-10-CM | POA: Insufficient documentation

## 2014-12-07 DIAGNOSIS — S3210XA Unspecified fracture of sacrum, initial encounter for closed fracture: Secondary | ICD-10-CM

## 2014-12-07 DIAGNOSIS — S0003XA Contusion of scalp, initial encounter: Secondary | ICD-10-CM | POA: Diagnosis not present

## 2014-12-07 DIAGNOSIS — Z791 Long term (current) use of non-steroidal anti-inflammatories (NSAID): Secondary | ICD-10-CM | POA: Insufficient documentation

## 2014-12-07 DIAGNOSIS — Y998 Other external cause status: Secondary | ICD-10-CM | POA: Insufficient documentation

## 2014-12-07 DIAGNOSIS — Y9389 Activity, other specified: Secondary | ICD-10-CM | POA: Insufficient documentation

## 2014-12-07 DIAGNOSIS — IMO0001 Reserved for inherently not codable concepts without codable children: Secondary | ICD-10-CM

## 2014-12-07 DIAGNOSIS — Y92002 Bathroom of unspecified non-institutional (private) residence single-family (private) house as the place of occurrence of the external cause: Secondary | ICD-10-CM | POA: Diagnosis not present

## 2014-12-07 DIAGNOSIS — N189 Chronic kidney disease, unspecified: Secondary | ICD-10-CM | POA: Insufficient documentation

## 2014-12-07 HISTORY — DX: Dorsalgia, unspecified: M54.9

## 2014-12-07 HISTORY — DX: Anemia, unspecified: D64.9

## 2014-12-07 HISTORY — DX: Gastro-esophageal reflux disease without esophagitis: K21.9

## 2014-12-07 HISTORY — DX: Dysphagia, unspecified: R13.10

## 2014-12-07 HISTORY — DX: Epilepsy, unspecified, not intractable, without status epilepticus: G40.909

## 2014-12-07 HISTORY — DX: Chronic respiratory failure, unspecified whether with hypoxia or hypercapnia: J96.10

## 2014-12-07 HISTORY — DX: Heart failure, unspecified: I50.9

## 2014-12-07 HISTORY — DX: Sleep apnea, unspecified: G47.30

## 2014-12-07 HISTORY — DX: Pneumonia, unspecified organism: J18.9

## 2014-12-07 HISTORY — DX: Muscle weakness (generalized): M62.81

## 2014-12-07 MED ORDER — OXYCODONE-ACETAMINOPHEN 5-325 MG PO TABS
ORAL_TABLET | ORAL | Status: AC
  Administered 2014-12-07: 0.5 via ORAL
  Filled 2014-12-07: qty 1

## 2014-12-07 MED ORDER — IBUPROFEN 200 MG PO TABS: 400.0000 mg | ORAL_TABLET | Freq: Three times a day (TID) | ORAL | Status: DC | PRN

## 2014-12-07 MED ORDER — OXYCODONE-ACETAMINOPHEN 5-325 MG PO TABS
0.5000 | ORAL_TABLET | Freq: Once | ORAL | Status: AC
  Administered 2014-12-07: 0.5 via ORAL

## 2014-12-07 MED ORDER — OXYCODONE-ACETAMINOPHEN 5-325 MG PO TABS
ORAL_TABLET | ORAL | Status: AC
  Filled 2014-12-07: qty 1

## 2014-12-07 NOTE — ED Notes (Signed)
Attempted to call daughter for notification of discharge but no answer Kristina Wilson, Kristina Alarlivia S, RN

## 2014-12-07 NOTE — Discharge Instructions (Signed)
Your exam and evaluation are reassuring. Your found to have a tailbone fracture. You may find a comfortable to sit on a doughnut shaped pillow. Return to emergency department for any new or worsening pain, weakness, numbness, altered mental status, or any other symptoms concerning to you.

## 2014-12-07 NOTE — ED Notes (Signed)
[facility] Johnda Billiot, Maryann Alarlivia S, RCharity fundraiser

## 2014-12-07 NOTE — ED Notes (Addendum)
Pt to rm 12 via ems from [facility].  [facility].  EMS reports witnessed mechanical fall from standing w/o LOC.  Pt reports pain to left ribs and back of head.  Pt c/o sinus congestion.  C-collar in place by EMS.  Pt NAD at this time.

## 2014-12-07 NOTE — ED Provider Notes (Signed)
[facility] Emergency Department Provider Note    ____________________________________________  Time seen: 6:30 AM  I have reviewed the triage vital signs and the nursing notes.   HISTORY  Chief Complaint Fall       HPI Kristina Wilson is a 81 y.o. female presents with history of accidental fall resulting in occipital blunt trauma. Patient states while in route to the bathroom she lost her balance in an attempt to catch herself she overcorrected falling backwards onto her tailbone and subsequently striking the occipital portion of her head. Patient denies any LOC admits to occipital head pain tailbone discomfort and left rib pain. Current pain 8 out of 10 and describes a sharp     Past Medical History  Diagnosis Date  . Muscle weakness   . Chronic respiratory failure   . Pneumonia   . Sleep apnea   . CHF (congestive heart failure)   . Chronic kidney disease   . Dysphagia   . GERD (gastroesophageal reflux disease)   . Anemia   . Back pain   . Epilepsy   . TIA (transient ischemic attack)     There are no active problems to display for this patient.   History reviewed. No pertinent past surgical history.  Current Outpatient Rx  Name  Route  Sig  Dispense  Refill  . acetaminophen (TYLENOL) 500 MG tablet   Oral   Take 1,000 mg by mouth 3 (three) times daily.         Marland Kitchen aspirin EC 81 MG tablet   Oral   Take 81 mg by mouth daily.         Marland Kitchen b complex-vitamin c-folic acid (NEPHRO-VITE) 0.8 MG TABS tablet   Oral   Take 1 tablet by mouth daily.         . carvedilol (COREG) 6.25 MG tablet   Oral   Take 6.25 mg by mouth 2 (two) times daily.         . Coenzyme Q-10 100 MG capsule   Oral   Take 300 mg by mouth daily.         . diclofenac sodium (VOLTAREN) 1 % GEL   Topical   Apply 2 g topically 4 (four) times daily. Apply to posterior neck until pain is relieved.         . diltiazem (CARDIZEM CD) 240 MG 24 hr capsule    Oral   Take 240 mg by mouth 2 (two) times daily.         Marland Kitchen docusate sodium (COLACE) 100 MG capsule   Oral   Take 100 mg by mouth every other day.         . fentaNYL (DURAGESIC - DOSED MCG/HR) 50 MCG/HR   Transdermal   Place 50 mcg onto the skin every 3 (three) days.         . ferrous sulfate 325 (65 FE) MG tablet   Oral   Take 325 mg by mouth 3 (three) times daily.         . furosemide (LASIX) 40 MG tablet   Oral   Take 40 mg by mouth daily.         Marland Kitchen gabapentin (NEURONTIN) 100 MG capsule   Oral   Take 100 mg by mouth 3 (three) times daily.         . hydrALAZINE (APRESOLINE) 50 MG tablet   Oral   Take 50 mg by mouth 3 (three) times daily.         Marland Kitchen  lidocaine (LIDODERM) 5 %   Transdermal   Place 1 patch onto the skin daily. Remove & Discard patch within 12 hours or as directed by MD         . loperamide (IMODIUM A-D) 2 MG tablet   Oral   Take 2-4 mg by mouth 4 (four) times daily as needed for diarrhea or loose stools. 4mg  to be administered for loose stool and 2mg  after each subsequent loose stool, up to 8 doses in 24 hours.         . metaxalone (SKELAXIN) 800 MG tablet   Oral   Take 400 mg by mouth every 8 (eight) hours.         . pantoprazole (PROTONIX) 40 MG tablet   Oral   Take 40 mg by mouth 2 (two) times daily.         . phenytoin (DILANTIN) 100 MG ER capsule   Oral   Take 300 mg by mouth at bedtime.         . polyvinyl alcohol-povidone (HYPOTEARS) 1.4-0.6 % ophthalmic solution   Both Eyes   Place 2 drops into both eyes 4 (four) times daily as needed (dry, irritated eyes).         . predniSONE (DELTASONE) 10 MG tablet   Oral   Take 10 mg by mouth daily.         . vitamin B-12 (CYANOCOBALAMIN) 1000 MCG tablet   Oral   Take 1,000 mcg by mouth daily.           Allergies Capsaicin; Codeine; Enalapril; Hydrocodone; Oxycodone; Tramadol; and Vicodin  History reviewed. No pertinent family history.  Social History History   Substance Use Topics  . Smoking status: Never Smoker   . Smokeless tobacco: Never Used  . Alcohol Use: No    Review of Systems  Constitutional: Negative for fever. Eyes: Negative for visual changes. ENT: Negative for sore throat. Cardiovascular: Point tenderness left rib 8 and 9 Respiratory: Negative for shortness of breath. Gastrointestinal: Negative for abdominal pain, vomiting and diarrhea. Genitourinary: Negative for dysuria. Musculoskeletal: Sacral/coccyx pain Skin: Negative for rash. Occipital contusion Neurological: Negative for headaches, focal weakness or numbness.   10-point ROS otherwise negative.  ____________________________________________   PHYSICAL EXAM:  VITAL SIGNS: ED Triage Vitals  Enc Vitals Group     BP 12/07/14 0601 190/63 mmHg     Pulse Rate 12/07/14 0601 89     Resp 12/07/14 0601 18     Temp 12/07/14 0601 98.7 F (37.1 C)     Temp Source 12/07/14 0601 Oral     SpO2 12/07/14 0601 88 %     Weight 12/07/14 0601 218 lb (98.884 kg)     Height 12/07/14 0601 5\' 5"  (1.651 m)     Head Cir --      Peak Flow --      Pain Score 12/07/14 0602 4     Pain Loc --      Pain Edu? --      Excl. in GC? --      Constitutional: Alert and oriented. Well appearing and in no distress. Eyes: Conjunctivae are normal. PERRL. Normal extraocular movements. ENT   Head: Normocephalic and atraumatic.   Nose: No congestion/rhinnorhea.   Mouth/Throat: Mucous membranes are moist.   Neck: No stridor. Cardiovascular: Normal rate, regular rhythm. Normal and symmetric distal pulses are present in all extremities. No murmurs, rubs, or gallops. Pain with palpation of the left eighth and ninth rib Respiratory: Normal  respiratory effort without tachypnea nor retractions. Breath sounds are clear and equal bilaterally. No wheezes/rales/rhonchi. Gastrointestinal: Soft and nontender. No distention. No abdominal bruits. There is no CVA tenderness. Genitourinary:  Deferred Musculoskeletal: Nontender with normal range of motion in all extremities. No joint effusions.  No lower extremity tenderness nor edema. Neurologic:  Normal speech and language. No gross focal neurologic deficits are appreciated. Speech is normal. No gait instability. Skin:  Skin is warm, dry and intact. No rash noted. Contusion noted left occipital region Psychiatric: Mood and affect are normal. Speech and behavior are normal. Patient exhibits appropriate insight and judgment.  ____________________________________________    LABS (pertinent positives/negatives)  None performed  ____________________________________________   EKG  None performed  ____________________________________________    RADIOLOGY  CT head pending chest x-ray pending  ____________________________________________   PROCEDURES     ____________________________________________   INITIAL IMPRESSION / ASSESSMENT AND PLAN / ED COURSE  Pertinent labs & imaging results that were available during my care of the patient were reviewed by me and considered in my medical decision making (see chart for details).  Given history and physical concern for intracranial injury hence CT head performed. Regarding left eighth and ninth rib pain suspect possible rib fracture. Hence chest x-ray performed.  ____________________________________________   FINAL CLINICAL IMPRESSION(S) / ED DIAGNOSES  Final diagnoses:  No diagnosis     Darci Currentandolph N Brown, MD 12/07/14 [phone]

## 2014-12-11 ENCOUNTER — Other Ambulatory Visit: Payer: Self-pay | Admitting: Gerontology

## 2014-12-11 DIAGNOSIS — M25511 Pain in right shoulder: Secondary | ICD-10-CM

## 2014-12-13 ENCOUNTER — Encounter
Admission: RE | Admit: 2014-12-13 | Discharge: 2014-12-13 | Disposition: A | Discharge status: Home / Self Care | Payer: Medicare Other | Source: Ambulatory Visit | Attending: Internal Medicine | Admitting: Internal Medicine

## 2014-12-14 ENCOUNTER — Other Ambulatory Visit: Payer: Self-pay | Admitting: Nephrology

## 2014-12-14 DIAGNOSIS — N2889 Other specified disorders of kidney and ureter: Secondary | ICD-10-CM

## 2014-12-20 ENCOUNTER — Ambulatory Visit: Payer: Medicare Other

## 2014-12-27 ENCOUNTER — Other Ambulatory Visit: Payer: Self-pay

## 2014-12-27 ENCOUNTER — Encounter
Admission: RE | Admit: 2014-12-27 | Discharge: 2014-12-27 | Disposition: A | Discharge status: Home / Self Care | Payer: Medicare Other | Source: Ambulatory Visit | Attending: Internal Medicine | Admitting: Internal Medicine

## 2014-12-27 ENCOUNTER — Inpatient Hospital Stay
Admission: EM | Admit: 2014-12-27 | Discharge: 2014-12-28 | DRG: 378 | Disposition: A | Discharge status: Skilled Nursing Facility | Payer: Medicare Other | Attending: Specialist | Admitting: Specialist

## 2014-12-27 ENCOUNTER — Emergency Department: Payer: Medicare Other

## 2014-12-27 ENCOUNTER — Encounter: Payer: Self-pay | Admitting: Emergency Medicine

## 2014-12-27 DIAGNOSIS — I890 Lymphedema, not elsewhere classified: Secondary | ICD-10-CM | POA: Diagnosis present

## 2014-12-27 DIAGNOSIS — G40909 Epilepsy, unspecified, not intractable, without status epilepticus: Secondary | ICD-10-CM | POA: Diagnosis present

## 2014-12-27 DIAGNOSIS — G8929 Other chronic pain: Secondary | ICD-10-CM | POA: Diagnosis present

## 2014-12-27 DIAGNOSIS — J9610 Chronic respiratory failure, unspecified whether with hypoxia or hypercapnia: Secondary | ICD-10-CM | POA: Diagnosis present

## 2014-12-27 DIAGNOSIS — D638 Anemia in other chronic diseases classified elsewhere: Secondary | ICD-10-CM | POA: Diagnosis present

## 2014-12-27 DIAGNOSIS — Z8249 Family history of ischemic heart disease and other diseases of the circulatory system: Secondary | ICD-10-CM | POA: Diagnosis not present

## 2014-12-27 DIAGNOSIS — Z9889 Other specified postprocedural states: Secondary | ICD-10-CM | POA: Diagnosis not present

## 2014-12-27 DIAGNOSIS — R531 Weakness: Secondary | ICD-10-CM

## 2014-12-27 DIAGNOSIS — N184 Chronic kidney disease, stage 4 (severe): Secondary | ICD-10-CM | POA: Diagnosis present

## 2014-12-27 DIAGNOSIS — D62 Acute posthemorrhagic anemia: Secondary | ICD-10-CM | POA: Diagnosis present

## 2014-12-27 DIAGNOSIS — K921 Melena: Principal | ICD-10-CM | POA: Diagnosis present

## 2014-12-27 DIAGNOSIS — Z807 Family history of other malignant neoplasms of lymphoid, hematopoietic and related tissues: Secondary | ICD-10-CM

## 2014-12-27 DIAGNOSIS — Z87891 Personal history of nicotine dependence: Secondary | ICD-10-CM | POA: Diagnosis not present

## 2014-12-27 DIAGNOSIS — K219 Gastro-esophageal reflux disease without esophagitis: Secondary | ICD-10-CM | POA: Diagnosis present

## 2014-12-27 DIAGNOSIS — L0390 Cellulitis, unspecified: Secondary | ICD-10-CM | POA: Diagnosis present

## 2014-12-27 DIAGNOSIS — R55 Syncope and collapse: Secondary | ICD-10-CM | POA: Diagnosis present

## 2014-12-27 DIAGNOSIS — Z7982 Long term (current) use of aspirin: Secondary | ICD-10-CM | POA: Diagnosis not present

## 2014-12-27 DIAGNOSIS — N179 Acute kidney failure, unspecified: Secondary | ICD-10-CM | POA: Diagnosis present

## 2014-12-27 DIAGNOSIS — N189 Chronic kidney disease, unspecified: Secondary | ICD-10-CM | POA: Insufficient documentation

## 2014-12-27 DIAGNOSIS — I959 Hypotension, unspecified: Secondary | ICD-10-CM | POA: Diagnosis present

## 2014-12-27 DIAGNOSIS — I5032 Chronic diastolic (congestive) heart failure: Secondary | ICD-10-CM | POA: Diagnosis present

## 2014-12-27 DIAGNOSIS — I739 Peripheral vascular disease, unspecified: Secondary | ICD-10-CM | POA: Diagnosis present

## 2014-12-27 DIAGNOSIS — Z823 Family history of stroke: Secondary | ICD-10-CM | POA: Diagnosis not present

## 2014-12-27 DIAGNOSIS — Z888 Allergy status to other drugs, medicaments and biological substances status: Secondary | ICD-10-CM

## 2014-12-27 DIAGNOSIS — Z885 Allergy status to narcotic agent status: Secondary | ICD-10-CM | POA: Diagnosis not present

## 2014-12-27 DIAGNOSIS — Z8673 Personal history of transient ischemic attack (TIA), and cerebral infarction without residual deficits: Secondary | ICD-10-CM

## 2014-12-27 DIAGNOSIS — G4730 Sleep apnea, unspecified: Secondary | ICD-10-CM | POA: Diagnosis present

## 2014-12-27 DIAGNOSIS — Z79899 Other long term (current) drug therapy: Secondary | ICD-10-CM

## 2014-12-27 DIAGNOSIS — D649 Anemia, unspecified: Secondary | ICD-10-CM | POA: Insufficient documentation

## 2014-12-27 DIAGNOSIS — K922 Gastrointestinal hemorrhage, unspecified: Secondary | ICD-10-CM | POA: Diagnosis present

## 2014-12-27 LAB — CBC WITH DIFFERENTIAL/PLATELET
Basophils Absolute: 0.1 10*3/uL (ref 0–0.1)
Basophils Absolute: 0 10*3/uL (ref 0–0.1)
Basophils Relative: 1 %
Basophils Relative: 0 %
Eosinophils Absolute: 0.3 10*3/uL (ref 0–0.7)
Eosinophils Absolute: 0.2 10*3/uL (ref 0–0.7)
Eosinophils Relative: 3 %
Eosinophils Relative: 1 %
HCT: 24 % — ABNORMAL LOW (ref 35.0–47.0)
HCT: 23.5 % — ABNORMAL LOW (ref 35.0–47.0)
HEMOGLOBIN: 7.8 g/dL — AB (ref 12.0–16.0)
Hemoglobin: 7.5 g/dL — ABNORMAL LOW (ref 12.0–16.0)
LYMPHS ABS: 1.9 10*3/uL (ref 1.0–3.6)
Lymphocytes Relative: 20 %
Lymphocytes Relative: 11 %
Lymphs Abs: 1.2 10*3/uL (ref 1.0–3.6)
MCH: 32.6 pg (ref 26.0–34.0)
MCH: 32.3 pg (ref 26.0–34.0)
MCHC: 32.3 g/dL (ref 32.0–36.0)
MCHC: 31.8 g/dL — ABNORMAL LOW (ref 32.0–36.0)
MCV: 100.8 fL — ABNORMAL HIGH (ref 80.0–100.0)
MCV: 101.4 fL — ABNORMAL HIGH (ref 80.0–100.0)
MONO ABS: 0.6 10*3/uL (ref 0.2–0.9)
MONOS PCT: 6 %
Monocytes Absolute: 1 10*3/uL — ABNORMAL HIGH (ref 0.2–0.9)
Monocytes Relative: 11 %
NEUTROS ABS: 9 10*3/uL — AB (ref 1.4–6.5)
Neutro Abs: 6.3 10*3/uL (ref 1.4–6.5)
Neutrophils Relative %: 65 %
Neutrophils Relative %: 82 %
PLATELETS: 235 10*3/uL (ref 150–440)
Platelets: 218 10*3/uL (ref 150–440)
RBC: 2.38 MIL/uL — AB (ref 3.80–5.20)
RBC: 2.32 MIL/uL — ABNORMAL LOW (ref 3.80–5.20)
RDW: 14.4 % (ref 11.5–14.5)
RDW: 14.5 % (ref 11.5–14.5)
WBC: 9.5 10*3/uL (ref 3.6–11.0)
WBC: 11.1 10*3/uL — ABNORMAL HIGH (ref 3.6–11.0)

## 2014-12-27 LAB — COMPREHENSIVE METABOLIC PANEL
ALK PHOS: 46 U/L (ref 38–126)
ALT: 11 U/L — ABNORMAL LOW (ref 14–54)
ALT: 13 U/L — ABNORMAL LOW (ref 14–54)
AST: 14 U/L — ABNORMAL LOW (ref 15–41)
AST: 16 U/L (ref 15–41)
Albumin: 3 g/dL — ABNORMAL LOW (ref 3.5–5.0)
Albumin: 3 g/dL — ABNORMAL LOW (ref 3.5–5.0)
Alkaline Phosphatase: 45 U/L (ref 38–126)
Anion gap: 12 (ref 5–15)
Anion gap: 12 (ref 5–15)
BUN: 73 mg/dL — ABNORMAL HIGH (ref 6–20)
BUN: 72 mg/dL — ABNORMAL HIGH (ref 6–20)
CHLORIDE: 109 mmol/L (ref 101–111)
CO2: 20 mmol/L — ABNORMAL LOW (ref 22–32)
CO2: 20 mmol/L — ABNORMAL LOW (ref 22–32)
CREATININE: 2.42 mg/dL — AB (ref 0.44–1.00)
Calcium: 9.5 mg/dL (ref 8.9–10.3)
Calcium: 9.3 mg/dL (ref 8.9–10.3)
Chloride: 107 mmol/L (ref 101–111)
Creatinine, Ser: 2.35 mg/dL — ABNORMAL HIGH (ref 0.44–1.00)
GFR calc Af Amer: 20 mL/min — ABNORMAL LOW (ref >60)
GFR calc Af Amer: 19 mL/min — ABNORMAL LOW (ref >60)
GFR calc non Af Amer: 17 mL/min — ABNORMAL LOW (ref >60)
GFR, EST NON AFRICAN AMERICAN: 17 mL/min — AB (ref >60)
GLUCOSE: 96 mg/dL (ref 65–99)
GLUCOSE: 147 mg/dL — AB (ref 65–99)
POTASSIUM: 4.5 mmol/L (ref 3.5–5.1)
POTASSIUM: 4.5 mmol/L (ref 3.5–5.1)
Sodium: 141 mmol/L (ref 135–145)
Sodium: 139 mmol/L (ref 135–145)
TOTAL PROTEIN: 5.9 g/dL — AB (ref 6.5–8.1)
Total Bilirubin: 0.3 mg/dL (ref 0.3–1.2)
Total Bilirubin: 0.2 mg/dL — ABNORMAL LOW (ref 0.3–1.2)
Total Protein: 5.8 g/dL — ABNORMAL LOW (ref 6.5–8.1)

## 2014-12-27 LAB — URINALYSIS COMPLETE WITH MICROSCOPIC (ARMC ONLY)
Bilirubin Urine: NEGATIVE
Glucose, UA: 50 mg/dL — AB
HGB URINE DIPSTICK: NEGATIVE
KETONES UR: NEGATIVE mg/dL
Leukocytes, UA: NEGATIVE
NITRITE: NEGATIVE
PROTEIN: 100 mg/dL — AB
Specific Gravity, Urine: 1.015 (ref 1.005–1.030)
pH: 5 (ref 5.0–8.0)

## 2014-12-27 LAB — TROPONIN I: Troponin I: <0.03 ng/mL (ref <0.031)

## 2014-12-27 LAB — ABO/RH: ABO/RH(D): A POS

## 2014-12-27 LAB — PHENYTOIN LEVEL, TOTAL: PHENYTOIN LVL: 7.2 ug/mL — AB (ref 10.0–20.0)

## 2014-12-27 LAB — PREPARE RBC (CROSSMATCH)

## 2014-12-27 MED ORDER — SODIUM CHLORIDE 0.9 % IV BOLUS (SEPSIS)
500.0000 mL | Freq: Once | INTRAVENOUS | Status: AC
  Administered 2014-12-27: 500 mL via INTRAVENOUS

## 2014-12-27 MED ORDER — RENA-VITE PO TABS
1.0000 | ORAL_TABLET | Freq: Every day | ORAL | Status: DC
  Administered 2014-12-27: 21:00:00 via ORAL
  Filled 2014-12-27 (×2): qty 1

## 2014-12-27 MED ORDER — HYDRALAZINE HCL 50 MG PO TABS
50.0000 mg | ORAL_TABLET | Freq: Three times a day (TID) | ORAL | Status: DC
  Administered 2014-12-27 – 2014-12-28 (×2): 50 mg via ORAL
  Filled 2014-12-27 (×3): qty 1

## 2014-12-27 MED ORDER — HYDRALAZINE HCL 20 MG/ML IJ SOLN
INTRAMUSCULAR | Status: AC
  Administered 2014-12-27: 10 mg via INTRAVENOUS
  Filled 2014-12-27: qty 1

## 2014-12-27 MED ORDER — PREDNISONE 10 MG PO TABS
10.0000 mg | ORAL_TABLET | Freq: Every day | ORAL | Status: DC
  Administered 2014-12-28: 09:00:00 10 mg via ORAL
  Filled 2014-12-27: qty 1

## 2014-12-27 MED ORDER — VITAMIN B-12 1000 MCG PO TABS
1000.0000 ug | ORAL_TABLET | Freq: Every day | ORAL | Status: DC
  Administered 2014-12-28: 09:00:00 1000 ug via ORAL
  Filled 2014-12-27: qty 1

## 2014-12-27 MED ORDER — SODIUM CHLORIDE 0.9 % IV SOLN
INTRAVENOUS | Status: DC
  Administered 2014-12-27: 16:00:00 via INTRAVENOUS

## 2014-12-27 MED ORDER — PANTOPRAZOLE SODIUM 40 MG PO TBEC
40.0000 mg | DELAYED_RELEASE_TABLET | Freq: Two times a day (BID) | ORAL | Status: DC
  Administered 2014-12-27 – 2014-12-28 (×2): 40 mg via ORAL
  Filled 2014-12-27 (×2): qty 1

## 2014-12-27 MED ORDER — CEPHALEXIN 500 MG PO CAPS
500.0000 mg | ORAL_CAPSULE | Freq: Three times a day (TID) | ORAL | Status: DC
  Filled 2014-12-27 (×2): qty 1

## 2014-12-27 MED ORDER — HYDRALAZINE HCL 20 MG/ML IJ SOLN
10.0000 mg | Freq: Four times a day (QID) | INTRAMUSCULAR | Status: DC | PRN
  Administered 2014-12-27 – 2014-12-28 (×2): 10 mg via INTRAVENOUS
  Filled 2014-12-27: qty 1

## 2014-12-27 MED ORDER — DICLOFENAC SODIUM 1 % TD GEL
2.0000 g | Freq: Four times a day (QID) | TRANSDERMAL | Status: DC
  Administered 2014-12-28 (×2): 2 g via TOPICAL
  Filled 2014-12-27: qty 100

## 2014-12-27 MED ORDER — ONDANSETRON HCL 4 MG/2ML IJ SOLN: 4.0000 mg | Freq: Four times a day (QID) | INTRAMUSCULAR | Status: DC | PRN

## 2014-12-27 MED ORDER — CARVEDILOL 6.25 MG PO TABS
6.2500 mg | ORAL_TABLET | Freq: Two times a day (BID) | ORAL | Status: DC
  Administered 2014-12-27 – 2014-12-28 (×2): 6.25 mg via ORAL
  Filled 2014-12-27 (×2): qty 1

## 2014-12-27 MED ORDER — FENTANYL 50 MCG/HR TD PT72
50.0000 ug | MEDICATED_PATCH | TRANSDERMAL | Status: DC
  Administered 2014-12-28: 11:00:00 50 ug via TRANSDERMAL
  Filled 2014-12-27: qty 1

## 2014-12-27 MED ORDER — ACETAMINOPHEN 500 MG PO TABS
1000.0000 mg | ORAL_TABLET | Freq: Three times a day (TID) | ORAL | Status: DC
  Administered 2014-12-27 – 2014-12-28 (×2): 1000 mg via ORAL
  Filled 2014-12-27 (×2): qty 2

## 2014-12-27 MED ORDER — LIDOCAINE 5 % EX PTCH
1.0000 | MEDICATED_PATCH | CUTANEOUS | Status: DC
  Administered 2014-12-28: 1 via TRANSDERMAL
  Filled 2014-12-27 (×2): qty 1

## 2014-12-27 MED ORDER — SODIUM CHLORIDE 0.9 % IV SOLN: 10.0000 mL/h | Freq: Once | INTRAVENOUS | Status: DC

## 2014-12-27 MED ORDER — COENZYME Q-10 100 MG PO CAPS: 300.0000 mg | ORAL_CAPSULE | Freq: Every day | ORAL | Status: DC

## 2014-12-27 MED ORDER — FUROSEMIDE 10 MG/ML IJ SOLN: 40.0000 mg | Freq: Once | INTRAMUSCULAR | Status: DC

## 2014-12-27 MED ORDER — DILTIAZEM HCL ER COATED BEADS 240 MG PO CP24
240.0000 mg | ORAL_CAPSULE | Freq: Two times a day (BID) | ORAL | Status: DC
  Administered 2014-12-27 – 2014-12-28 (×2): 240 mg via ORAL
  Filled 2014-12-27 (×2): qty 1

## 2014-12-27 MED ORDER — ACETAMINOPHEN 325 MG PO TABS: 650.0000 mg | ORAL_TABLET | ORAL | Status: DC | PRN

## 2014-12-27 MED ORDER — LORATADINE 10 MG PO TABS
10.0000 mg | ORAL_TABLET | Freq: Every day | ORAL | Status: DC
  Administered 2014-12-28: 09:00:00 10 mg via ORAL
  Filled 2014-12-27: qty 1

## 2014-12-27 MED ORDER — POLYVINYL ALCOHOL 1.4 % OP SOLN
2.0000 [drp] | Freq: Four times a day (QID) | OPHTHALMIC | Status: DC | PRN
  Filled 2014-12-27: qty 15

## 2014-12-27 MED ORDER — DOCUSATE SODIUM 100 MG PO CAPS: 100.0000 mg | ORAL_CAPSULE | ORAL | Status: DC

## 2014-12-27 MED ORDER — GABAPENTIN 100 MG PO CAPS
100.0000 mg | ORAL_CAPSULE | Freq: Three times a day (TID) | ORAL | Status: DC
  Administered 2014-12-27 – 2014-12-28 (×2): 100 mg via ORAL
  Filled 2014-12-27 (×2): qty 1

## 2014-12-27 MED ORDER — SODIUM CHLORIDE 0.9 % IJ SOLN: 3.0000 mL | Freq: Two times a day (BID) | INTRAMUSCULAR | Status: DC

## 2014-12-27 MED ORDER — ONDANSETRON HCL 4 MG PO TABS: 4.0000 mg | ORAL_TABLET | Freq: Four times a day (QID) | ORAL | Status: DC | PRN

## 2014-12-27 MED ORDER — PHENYTOIN SODIUM EXTENDED 100 MG PO CAPS
300.0000 mg | ORAL_CAPSULE | Freq: Every day | ORAL | Status: DC
  Administered 2014-12-27: 300 mg via ORAL
  Filled 2014-12-27: qty 3

## 2014-12-27 NOTE — ED Notes (Signed)
Type and screen obtained and sent to lab for blood transfusion.

## 2014-12-27 NOTE — ED Notes (Signed)
[facility] [place] with c/o near syncopal episode this am just prior to beginning exercise this am with PT. Pt states she felt drained and weak during episode, states she was unable to lift her arms. Now, pt stronger, denies new complaints of pain (chronic pain d/t broken tailbone recently d/t fall). Alert and oriented.

## 2014-12-27 NOTE — Plan of Care (Signed)
Problem: Discharge Progression Outcomes Goal: Discharge plan in place and appropriate Individualization     

## 2014-12-27 NOTE — H&P (Signed)
[facility] [place] NAME: Deyana Wnuk    MR#:  [id number]  DATE OF BIRTH:  06-Nov-1922  DATE OF ADMISSION:  12/27/2014  PRIMARY CARE PHYSICIAN: Albina Billet, MD   REQUESTING/REFERRING PHYSICIAN: Dr. Darl Householder  CHIEF COMPLAINT:   Chief Complaint  Patient presents with  . Near Syncope  . Weakness    HISTORY OF PRESENT ILLNESS:  Nicoletta Hush  is a 81 y.o. female with a known history of CHF, chronic anemia, chronic kidney disease stage IV presents with hypotension and presyncope. She reports that she was in her usual state of health yesterday. This morning she woke feeling weak. Physical therapy came to get her she was able to only walk 10 feet down the hallway and then had to ride in a wheelchair to the physical therapy suite. Once there she got onto the stationary bike and tried to ride when everything went black and she felt very weak. Her blood pressure was checked at that time was 84/30. She was brought back to her round drink a lot of water and felt slightly better but was brought to the emergency room for further evaluation. She denies loss of consciousness or fall. In the ED her presenting blood pressure was 166/51. EKG normal sinus rhythm. Creatinine slightly elevated, hemoglobin decreased from baseline. Stool FOBT positive. She is admitted for possibility of GI bleed  PAST MEDICAL HISTORY:   Past Medical History  Diagnosis Date  . Muscle weakness   . Chronic respiratory failure   . Pneumonia   . Sleep apnea   . CHF (congestive heart failure)   . Chronic kidney disease   . Dysphagia   . GERD (gastroesophageal reflux disease)   . Anemia   . Back pain   . Epilepsy   . TIA (transient ischemic attack)     PAST SURGICAL HISTORY:   Past Surgical History  Procedure Laterality Date  . Back surgery    . Knee surgery    . Carotid endarterectomy    . Tonsillectomy      SOCIAL HISTORY:   History  Substance Use  Topics  . Smoking status: Former Research scientist (life sciences)  . Smokeless tobacco: Never Used  . Alcohol Use: No    FAMILY HISTORY:   Family History  Problem Relation Age of Onset  . CAD Mother   . CAD Father   . Anemia Father   . CVA Sister   . Multiple myeloma Brother     DRUG ALLERGIES:   Allergies  Allergen Reactions  . Capsaicin   . Codeine   . Enalapril   . Hydrocodone   . Oxycodone   . Tramadol   . Vicodin [Hydrocodone-Acetaminophen]     REVIEW OF SYSTEMS:   Review of Systems  Constitutional: Positive for malaise/fatigue. Negative for fever, chills, weight loss and diaphoresis.  HENT: Positive for hearing loss and sore throat.   Eyes: Positive for blurred vision. Negative for double vision and photophobia.  Respiratory: Negative for cough, hemoptysis, sputum production, shortness of breath and wheezing.   Cardiovascular: Positive for leg swelling. Negative for chest pain, palpitations, orthopnea and claudication.  Gastrointestinal: Negative for heartburn, nausea, vomiting, abdominal pain, diarrhea, constipation, blood in stool and melena.  Genitourinary: Negative for dysuria, urgency and frequency.  Musculoskeletal: Positive for myalgias and back pain.  Skin: Positive for rash.  Neurological: Positive for seizures and weakness. Negative for dizziness, sensory change, focal weakness, loss of consciousness and headaches.  Endo/Heme/Allergies: Does not  bruise/bleed easily.  Psychiatric/Behavioral: Negative for memory loss.    MEDICATIONS AT HOME:   Prior to Admission medications   Medication Sig Start Date End Date Taking? Authorizing Provider  acetaminophen (TYLENOL) 325 MG tablet Take 650 mg by mouth every 4 (four) hours as needed for mild pain or fever.   Yes Historical Provider, MD  acetaminophen (TYLENOL) 500 MG tablet Take 1,000 mg by mouth 3 (three) times daily.   Yes Historical Provider, MD  aspirin EC 81 MG tablet Take 81 mg by mouth daily.   Yes Historical Provider, MD   b complex-vitamin c-folic acid (NEPHRO-VITE) 0.8 MG TABS tablet Take 1 tablet by mouth daily.   Yes Historical Provider, MD  carvedilol (COREG) 6.25 MG tablet Take 6.25 mg by mouth 2 (two) times daily.   Yes Historical Provider, MD  cetirizine (ZYRTEC) 10 MG tablet Take 10 mg by mouth daily.   Yes Historical Provider, MD  Coenzyme Q-10 100 MG capsule Take 300 mg by mouth daily.   Yes Historical Provider, MD  diclofenac sodium (VOLTAREN) 1 % GEL Apply 2 g topically 4 (four) times daily. Apply to posterior neck and right shoulder until pain is relieved.   Yes Historical Provider, MD  diltiazem (CARDIZEM CD) 240 MG 24 hr capsule Take 240 mg by mouth 2 (two) times daily.   Yes Historical Provider, MD  docusate sodium (COLACE) 100 MG capsule Take 100 mg by mouth every other day. At bedtime   Yes Historical Provider, MD  fentaNYL (DURAGESIC - DOSED MCG/HR) 50 MCG/HR Place 50 mcg onto the skin every 3 (three) days.   Yes Historical Provider, MD  ferrous sulfate 325 (65 FE) MG tablet Take 325 mg by mouth 3 (three) times daily.   Yes Historical Provider, MD  furosemide (LASIX) 40 MG tablet Take 40 mg by mouth daily.   Yes Historical Provider, MD  gabapentin (NEURONTIN) 100 MG capsule Take 100 mg by mouth 3 (three) times daily.   Yes Historical Provider, MD  hydrALAZINE (APRESOLINE) 50 MG tablet Take 50 mg by mouth 3 (three) times daily.   Yes Historical Provider, MD  lidocaine (LIDODERM) 5 % Place 1 patch onto the skin daily. Remove & Discard patch within 12 hours or as directed by MD   Yes Historical Provider, MD  loperamide (IMODIUM A-D) 2 MG tablet Take 2-4 mg by mouth 4 (four) times daily as needed for diarrhea or loose stools. 33m to be administered for loose stool and 2109mafter each subsequent loose stool, up to 8 doses in 24 hours.   Yes Historical Provider, MD  metaxalone (SKELAXIN) 800 MG tablet Take 400 mg by mouth every 8 (eight) hours.   Yes Historical Provider, MD  Multiple Vitamins-Minerals  (PRESERVISION AREDS) CAPS Take 1 capsule by mouth 2 (two) times daily.    Yes Historical Provider, MD  pantoprazole (PROTONIX) 40 MG tablet Take 40 mg by mouth 2 (two) times daily.   Yes Historical Provider, MD  phenytoin (DILANTIN) 100 MG ER capsule Take 300 mg by mouth at bedtime.   Yes Historical Provider, MD  polyvinyl alcohol-povidone (HYPOTEARS) 1.4-0.6 % ophthalmic solution Place 2 drops into both eyes 4 (four) times daily as needed (dry, irritated eyes).   Yes Historical Provider, MD  predniSONE (DELTASONE) 10 MG tablet Take 10 mg by mouth daily.   Yes Historical Provider, MD  vitamin B-12 (CYANOCOBALAMIN) 1000 MCG tablet Take 1,000 mcg by mouth daily.   Yes Historical Provider, MD  VITAL SIGNS:  Blood pressure 175/55, pulse 58, temperature 97.5 F (36.4 C), temperature source Oral, resp. rate 14, height 5' 5"  (1.651 m), weight 94.348 kg (208 lb), SpO2 98 %.  PHYSICAL EXAMINATION:  GENERAL:  81 y.o.-year-old patient lying in the bed with no acute distress.  EYES: Pupils equal, round, reactive to light and accommodation. No scleral icterus. Extraocular muscles intact.  HEENT: Head atraumatic, normocephalic. Oropharynx and nasopharynx clear. Moist mucous membranes, fair dentition NECK:  Supple, no jugular venous distention. No thyroid enlargement, no tenderness.  LUNGS: Bibasilar crackles, good air movement, no respiratory distress or use of accessory muscles, no wheezing or rails CARDIOVASCULAR: S1, S2 normal. No murmurs, rubs, or gallops.  ABDOMEN: Soft, nontender, nondistended. Bowel sounds present. No organomegaly or mass. Guarding no rebound EXTREMITIES: 2+ pitting edema bilaterally, erythema over both lower legs, superficial skin breakdown over both lower legs NEUROLOGIC: Cranial nerves II through XII are intact. Muscle strength 5/5 in all extremities. Sensation intact. Gait not checked.  PSYCHIATRIC: The patient is alert and oriented x 3.  SKIN: Erythema and superficial skin  breakdown over both lower legs  LABORATORY PANEL:   CBC  Recent Labs Lab 12/27/14 1021  WBC 11.1*  HGB 7.5*  HCT 23.5*  PLT 218   ------------------------------------------------------------------------------------------------------------------  Chemistries   Recent Labs Lab 12/27/14 1021  NA 139  K 4.5  CL 107  CO2 20*  GLUCOSE 147*  BUN 72*  CREATININE 2.42*  CALCIUM 9.3  AST 16  ALT 13*  ALKPHOS 46  BILITOT 0.2*   ------------------------------------------------------------------------------------------------------------------  Cardiac Enzymes  Recent Labs Lab 12/27/14 1021  TROPONINI <0.03   ------------------------------------------------------------------------------------------------------------------  RADIOLOGY:  Dg Chest 2 View  12/27/2014   CLINICAL DATA:  Near syncope.  Weakness.  Anemia.  EXAM: CHEST  2 VIEW  COMPARISON:  Dec 07, 2014  FINDINGS: The previously noted left effusion and left base atelectasis remains without change. No new opacity. Cardiomegaly is stable. The pulmonary vascularity is within normal limits. There is atherosclerotic change in aorta. No adenopathy. Bones are osteoporotic.  IMPRESSION: Chronic atelectasis left base with left effusion. No new opacity. Stable cardiomegaly. No appreciable change compared to prior study.   Electronically Signed   By: Lowella Grip III M.D.   On: 12/27/2014 11:13    EKG:   Orders placed or performed during the hospital encounter of 12/27/14  . EKG 12-Lead  . EKG 12-Lead    IMPRESSION AND PLAN:    Principal Problem:   Hypotension Active Problems:   GI bleed   Anemia of chronic disease   Acute posthemorrhagic anemia   Acute renal failure superimposed on stage 4 chronic kidney disease   Cellulitis   #1 hypotension: Differential includes acute blood loss, sepsis, vagal event during exercise, cardiogenic hypotension. Upon presentation her blood pressure had normalized. She has  received 500 mL's of normal saline. She's feeling much more energetic. The pressure elevating over 180/90 on my examination I will resume her antihypertensives. Regarding sepsis workup UA is pending, chest x-ray is negative, blood cultures are pending.  #2 acute on chronic anemia with possible GI bleed: Her baseline hemoglobin is about 7-8. She is very slightly lower than that today at 7.5. Her stool is FOBT positive, but she does take iron supplements which can result in a false positive test. One unit packed red blood cells has been ordered in the emergency room. I am not completely convinced that this is a GI bleed. She has never had a colonoscopy. We'll  continue to monitor hemoglobin and stools for bleeding. Not likely a good candidate for further evaluation. Will hold off on GI consultation for now. We'll also hold her aspirin in case she is moving. Apply SCDs rather than pharmacological DVT prophylaxis  #3 cellulitis: She has chronic lymph edema. On examination she does have erythema and tenderness over both lower extremities which she and her daughter report is new. We will obtain blood cultures. We will start Keflex.  #4 acute on chronic kidney disease stage IV: Her baseline creatinine is around 2 with a GFR of 20. Today her creatinine is 2.4 with a GFR of 17. This may be due to the hypotension she experienced earlier today and will hopefully improve now that blood pressures are back to normal. We will hold off on diarrhetic and nephrotoxins at this time. She does not seem overtly volume overloaded. She has received 500 cc normal saline and I'll continue with gentle hydration to improve renal function  #5 chronic congestive heart failure: Care everywhere reports she has a history of diastolic heart failure, I do not see a 2-D echocardiogram. Continue Cardizem, carvedilol, holding Lasix for today due to renal injury. No signs of overt fluid overload to monitor carefully.  #6 seizure disorder:  Episode today does not sound like a seizure. Continue with Dilantin.  #7 prophylaxis: Continue PPI as she is on 1 at home. No heparin in the setting of possible GI bleed will use SCDs for DVT prophylaxis    All the records are reviewed and case discussed with ED provider. Management plans discussed with the patient, family and they are in agreement.  CODE STATUS: Full  TOTAL TIME TAKING CARE OF THIS PATIENT: 50 minutes.    Myrtis Ser M.D on 12/27/2014 at 1:17 PM  Between 7am to 6pm - Pager - [phone]  After 6pm go to [email or web address] - password [email or web address] [facility]  Office  [phone]  CC: Primary care physician; Albina Billet, MD

## 2014-12-27 NOTE — ED Notes (Signed)
[facility] Pt will receive blood transfusion on the floor.

## 2014-12-27 NOTE — ED Provider Notes (Signed)
CSN: [id number]     Arrival date & time 12/27/14  [id number] History   First MD Initiated Contact with Patient 12/27/14 1010     Chief Complaint  Patient presents with  . Near Syncope  . Weakness     (Consider location/radiation/quality/duration/timing/severity/associated sxs/prior Treatment) The history is provided by the patient.  Kristina Wilson is a 81 y.o. female hx of CHF, GERD, CKD here with near syncope. Patient was a physical therapy and feels tired and almost passed out. Denies falling or actual syncope. Denies chest pain or shortness of breath. She took her blood pressure and was in the 80s. Had syncope several weeks ago and was evaluated in the ED and had CT head that was normal and had some rib fractures. Denies fever or cough or abdominal pain.    Past Medical History  Diagnosis Date  . Muscle weakness   . Chronic respiratory failure   . Pneumonia   . Sleep apnea   . CHF (congestive heart failure)   . Chronic kidney disease   . Dysphagia   . GERD (gastroesophageal reflux disease)   . Anemia   . Back pain   . Epilepsy   . TIA (transient ischemic attack)    Past Surgical History  Procedure Laterality Date  . Back surgery    . Knee surgery    . Carotid endarterectomy    . Tonsillectomy     No family history on file. History  Substance Use Topics  . Smoking status: Former Games developermoker  . Smokeless tobacco: Never Used  . Alcohol Use: No   OB History    No data available     Review of Systems  Neurological: Positive for dizziness.  All other systems reviewed and are negative.     Allergies  Capsaicin; Codeine; Enalapril; Hydrocodone; Oxycodone; Tramadol; and Vicodin  Home Medications   Prior to Admission medications   Medication Sig Start Date End Date Taking? Authorizing Provider  acetaminophen (TYLENOL) 325 MG tablet Take 650 mg by mouth every 4 (four) hours as needed for mild pain or fever.   Yes Historical Provider, MD  acetaminophen (TYLENOL) 500  MG tablet Take 1,000 mg by mouth 3 (three) times daily.   Yes Historical Provider, MD  aspirin EC 81 MG tablet Take 81 mg by mouth daily.   Yes Historical Provider, MD  b complex-vitamin c-folic acid (NEPHRO-VITE) 0.8 MG TABS tablet Take 1 tablet by mouth daily.   Yes Historical Provider, MD  carvedilol (COREG) 6.25 MG tablet Take 6.25 mg by mouth 2 (two) times daily.   Yes Historical Provider, MD  cetirizine (ZYRTEC) 10 MG tablet Take 10 mg by mouth daily.   Yes Historical Provider, MD  Coenzyme Q-10 100 MG capsule Take 300 mg by mouth daily.   Yes Historical Provider, MD  diclofenac sodium (VOLTAREN) 1 % GEL Apply 2 g topically 4 (four) times daily. Apply to posterior neck and right shoulder until pain is relieved.   Yes Historical Provider, MD  diltiazem (CARDIZEM CD) 240 MG 24 hr capsule Take 240 mg by mouth 2 (two) times daily.   Yes Historical Provider, MD  docusate sodium (COLACE) 100 MG capsule Take 100 mg by mouth every other day. At bedtime   Yes Historical Provider, MD  fentaNYL (DURAGESIC - DOSED MCG/HR) 50 MCG/HR Place 50 mcg onto the skin every 3 (three) days.   Yes Historical Provider, MD  ferrous sulfate 325 (65 FE) MG tablet Take 325 mg by mouth 3 (  three) times daily.   Yes Historical Provider, MD  furosemide (LASIX) 40 MG tablet Take 40 mg by mouth daily.   Yes Historical Provider, MD  gabapentin (NEURONTIN) 100 MG capsule Take 100 mg by mouth 3 (three) times daily.   Yes Historical Provider, MD  hydrALAZINE (APRESOLINE) 50 MG tablet Take 50 mg by mouth 3 (three) times daily.   Yes Historical Provider, MD  lidocaine (LIDODERM) 5 % Place 1 patch onto the skin daily. Remove & Discard patch within 12 hours or as directed by MD   Yes Historical Provider, MD  loperamide (IMODIUM A-D) 2 MG tablet Take 2-4 mg by mouth 4 (four) times daily as needed for diarrhea or loose stools.  to be administered for loose stool and  after each subsequent loose stool, up to 8 doses in 24 hours.   Yes  Historical Provider, MD  metaxalone (SKELAXIN) 800 MG tablet Take 400 mg by mouth every 8 (eight) hours.   Yes Historical Provider, MD  Multiple Vitamins-Minerals (PRESERVISION AREDS) CAPS Take 1 capsule by mouth 2 (two) times daily.    Yes Historical Provider, MD  pantoprazole (PROTONIX) 40 MG tablet Take 40 mg by mouth 2 (two) times daily.   Yes Historical Provider, MD  phenytoin (DILANTIN) 100 MG ER capsule Take 300 mg by mouth at bedtime.   Yes Historical Provider, MD  polyvinyl alcohol-povidone (HYPOTEARS) 1.4-0.6 % ophthalmic solution Place 2 drops into both eyes 4 (four) times daily as needed (dry, irritated eyes).   Yes Historical Provider, MD  predniSONE (DELTASONE) 10 MG tablet Take 10 mg by mouth daily.   Yes Historical Provider, MD  vitamin B-12 (CYANOCOBALAMIN) 1000 MCG tablet Take 1,000 mcg by mouth daily.   Yes Historical Provider, MD   BP 166/51 mmHg  Pulse 54  Temp(Src) 97.5 F (36.4 C) (Oral)  Resp 18  Ht  (1.651 m)  Wt 208 lb (94.348 kg)  BMI 34.61 kg/m2  SpO2 97% Physical Exam  Constitutional: She is oriented to person, place, and time.  Chronically ill   HENT:  Head: Normocephalic.  Mouth/Throat: Oropharynx is clear and moist.  Eyes: Conjunctivae are normal. Pupils are equal, round, and reactive to light.  Neck: Normal range of motion. Neck supple.  Cardiovascular: Normal rate, regular rhythm and normal heart sounds.   Pulmonary/Chest: Effort normal and breath sounds normal. No respiratory distress. She has no wheezes.  Abdominal: Soft. Bowel sounds are normal. She exhibits no distension. There is no tenderness. There is no rebound.  Musculoskeletal: Normal range of motion. She exhibits no edema or tenderness.  Neurological: She is alert and oriented to person, place, and time. No cranial nerve deficit.  CN 2-12 intact. Nl strength throughout   Skin: Skin is warm.  Psychiatric: She has a normal mood and affect. Her behavior is normal.  Nursing note and  vitals reviewed.   ED Course  Procedures (including critical care time)  CRITICAL CARE Performed by: Silverio Lay, Emmett Arntz   Total critical care time: 30 min   Critical care time was exclusive of separately billable procedures and treating other patients.  Critical care was necessary to treat or prevent imminent or life-threatening deterioration.  Critical care was time spent personally by me on the following activities: development of treatment plan with patient and/or surrogate as well as nursing, discussions with consultants, evaluation of patient's response to treatment, examination of patient, obtaining history from patient or surrogate, ordering and performing treatments and interventions, ordering and review of laboratory studies,  ordering and review of radiographic studies, pulse oximetry and re-evaluation of patient's condition.   Labs Review Labs Reviewed  CBC WITH DIFFERENTIAL/PLATELET - Abnormal; Notable for the following:    WBC 11.1 (*)    RBC 2.32 (*)    Hemoglobin 7.5 (*)    HCT 23.5 (*)    MCV 101.4 (*)    MCHC 31.8 (*)    Neutro Abs 9.0 (*)    All other components within normal limits  COMPREHENSIVE METABOLIC PANEL - Abnormal; Notable for the following:    CO2 20 (*)    Glucose, Bld 147 (*)    BUN 72 (*)    Creatinine, Ser 2.42 (*)    Total Protein 5.9 (*)    Albumin 3.0 (*)    ALT 13 (*)    Total Bilirubin 0.2 (*)    GFR calc non Af Amer 17 (*)    GFR calc Af Amer 19 (*)    All other components within normal limits  TROPONIN I  PHENYTOIN LEVEL, TOTAL  URINALYSIS COMPLETEWITH MICROSCOPIC ([facility] ONLY)  PREPARE RBC (CROSSMATCH)    Imaging Review Dg Chest 2 View  12/27/2014   CLINICAL DATA:  Near syncope.  Weakness.  Anemia.  EXAM: CHEST  2 VIEW  COMPARISON:  Dec 07, 2014  FINDINGS: The previously noted left effusion and left base atelectasis remains without change. No new opacity. Cardiomegaly is stable. The pulmonary vascularity is within normal limits. There is  atherosclerotic change in aorta. No adenopathy. Bones are osteoporotic.  IMPRESSION: Chronic atelectasis left base with left effusion. No new opacity. Stable cardiomegaly. No appreciable change compared to prior study.   Electronically Signed   By: Bretta Bang III M.D.   On: 12/27/2014 11:13     EKG Interpretation None       ED ECG REPORT   Date: 12/27/2014  EKG Time: 12:22 PM  Rate: 53  Rhythm: normal sinus rhythm,  normal EKG, normal sinus rhythm  Axis: normal  Intervals:none  ST&T Change: nonspecific  Narrative Interpretation: sinus brady             MDM   Final diagnoses:  Weakness   Kristina Wilson is a 81 y.o. female here with near syncope. Consider orthostatic hypotension as etiology. Will repeat labs, sepsis workup.   12:22 PM Occ positive. Hg 7.5, baseline 8-9. Cr and BUN elevated, consistent with GI bleed. Unable to stand up due to weakness. Transfused 1 U PRBC for symptomatic anemia. Will admit.    Richardean Canal, MD 12/27/14 [phone]

## 2014-12-28 LAB — TYPE AND SCREEN
ABO/RH(D): A POS
Antibody Screen: NEGATIVE
Unit division: 0

## 2014-12-28 LAB — CBC
HCT: 26.3 % — ABNORMAL LOW (ref 35.0–47.0)
Hemoglobin: 8.6 g/dL — ABNORMAL LOW (ref 12.0–16.0)
MCH: 32.2 pg (ref 26.0–34.0)
MCHC: 32.6 g/dL (ref 32.0–36.0)
MCV: 99 fL (ref 80.0–100.0)
Platelets: 213 10*3/uL (ref 150–440)
RBC: 2.65 MIL/uL — AB (ref 3.80–5.20)
RDW: 15.6 % — ABNORMAL HIGH (ref 11.5–14.5)
WBC: 7.5 10*3/uL (ref 3.6–11.0)

## 2014-12-28 LAB — BASIC METABOLIC PANEL
Anion gap: 9 (ref 5–15)
BUN: 72 mg/dL — ABNORMAL HIGH (ref 6–20)
CALCIUM: 9.1 mg/dL (ref 8.9–10.3)
CHLORIDE: 111 mmol/L (ref 101–111)
CO2: 20 mmol/L — AB (ref 22–32)
CREATININE: 2.27 mg/dL — AB (ref 0.44–1.00)
GFR calc Af Amer: 21 mL/min — ABNORMAL LOW (ref >60)
GFR calc non Af Amer: 18 mL/min — ABNORMAL LOW (ref >60)
Glucose, Bld: 82 mg/dL (ref 65–99)
POTASSIUM: 4.1 mmol/L (ref 3.5–5.1)
SODIUM: 140 mmol/L (ref 135–145)

## 2014-12-28 LAB — PHENYTOIN LEVEL, FREE AND TOTAL
Phenytoin, Free: 0.7 ug/mL — ABNORMAL LOW (ref 1.0–2.0)
Phenytoin, Total: 4.8 ug/mL — ABNORMAL LOW (ref 10.0–20.0)

## 2014-12-28 MED ORDER — CLONIDINE HCL 0.1 MG PO TABS
0.2000 mg | ORAL_TABLET | ORAL | Status: AC
  Administered 2014-12-28: 0.2 mg via ORAL
  Filled 2014-12-28: qty 2

## 2014-12-28 MED ORDER — FENTANYL 50 MCG/HR TD PT72: 50.0000 ug | MEDICATED_PATCH | TRANSDERMAL | Status: DC

## 2014-12-28 MED ORDER — CEPHALEXIN 250 MG PO CAPS: 250.0000 mg | ORAL_CAPSULE | Freq: Three times a day (TID) | ORAL | Status: AC

## 2014-12-28 NOTE — Discharge Instructions (Signed)
°  DIET:  °Cardiac diet ° °DISCHARGE CONDITION:  °Stable ° °ACTIVITY:  °Activity as tolerated ° °OXYGEN:  °Home Oxygen: No. °  °Oxygen Delivery: room air ° °DISCHARGE LOCATION:  °nursing home  ° °If you experience worsening of your admission symptoms, develop shortness of breath, life threatening emergency, suicidal or homicidal thoughts you must seek medical attention immediately by calling 911 or calling your MD immediately  if symptoms less severe. ° °You Must read complete instructions/literature along with all the possible adverse reactions/side effects for all the Medicines you take and that have been prescribed to you. Take any new Medicines after you have completely understood and accpet all the possible adverse reactions/side effects.  ° °Please note ° °You were cared for by a hospitalist during your hospital stay. If you have any questions about your discharge medications or the care you received while you were in the hospital after you are discharged, you can call the unit and asked to speak with the hospitalist on call if the hospitalist that took care of you is not available. Once you are discharged, your primary care physician will handle any further medical issues. Please note that NO REFILLS for any discharge medications will be authorized once you are discharged, as it is imperative that you return to your primary care physician (or establish a relationship with a primary care physician if you do not have one) for your aftercare needs so that they can reassess your need for medications and monitor your lab values. ° ° ° °

## 2014-12-28 NOTE — Plan of Care (Signed)
Problem: Discharge Progression Outcomes Goal: Discharge plan in place and appropriate Individualization of Care Prefers to be called Kristina Wilson history of CHF, chronic anemia, chronic kidney disease stage IV, copd, epilepsy, tia, pneumonia and muscle weakness controlled by home medications. HIGH fall Precautions.  Goal: Other Discharge Outcomes/Goals Plan of Care Progress to Goal:  Pt received 1 upc for hgb of 7.5 tolerated blood well, will monitor for HGB results this am, pt without c/o pain,

## 2014-12-28 NOTE — Plan of Care (Signed)
Problem: Discharge Progression Outcomes Goal: Other Discharge Outcomes/Goals Outcome: Adequate for Discharge Pt is alert and oriented, pt c/o pain with movement, fentanyl patch and lidoderm patch in place, pt is up to bathroom with stand by assist, up to chair throughout shift, diet advanced to heart healthy, tolerating diet. No bm throughout shift, hgb improved to 8.6. Continues on room air, refuses keflex. Systolic hypertension, Dr. Cherlynn KaiserSainani aware, hydralazine and clonidine given per MD request, BP improved to 169/45, Dr. Cherlynn KaiserSainani okay for pt to be d/c with presenting blood pressure. Pt d/c to [facility], report given to [place], pt will be transported via daughter.

## 2014-12-28 NOTE — Discharge Summary (Signed)
[facility] [place] [facility]   PATIENT NAME: Kristina Wilson    MR#:  [id number]  DATE OF BIRTH:  [birth year]  DATE OF ADMISSION:  12/27/2014 ADMITTING PHYSICIAN: Gale Journey, MD  DATE OF DISCHARGE: No discharge date for patient encounter.  PRIMARY CARE PHYSICIAN: TATE,DENNY C, MD    ADMISSION DIAGNOSIS:  Weakness [R53.1] Gastrointestinal hemorrhage with melena [K92.1]  DISCHARGE DIAGNOSIS:  Principal Problem:   Hypotension Active Problems:   GI bleed   Anemia of chronic disease   Acute posthemorrhagic anemia   Acute renal failure superimposed on stage 4 chronic kidney disease   Cellulitis   SECONDARY DIAGNOSIS:   Past Medical History  Diagnosis Date  . Muscle weakness   . Chronic respiratory failure   . Pneumonia   . Sleep apnea   . CHF (congestive heart failure)   . Chronic kidney disease   . Dysphagia   . GERD (gastroesophageal reflux disease)   . Anemia   . Back pain   . Epilepsy   . TIA (transient ischemic attack)     HOSPITAL COURSE:   81 year old female with past medical history of chronic kidney disease, GERD, chronic anemia, seizure disorder, history of previous TIA, chronic respiratory failure, peripheral vascular disease, who presented to the hospital due to hypotension noted to be anemic.  #1 hypotension: Likely cause of patient's hypotension is probably due to volume loss. She received some IV fluids and was transfused 1 unit of packed red blood cells and her hypotension since then has resolved. - Sepsis has been ruled out patient's urinalysis and chest x-ray negative and her blood cultures also negative presently.  #2 acute on chronic anemia with possible GI bleed: Patient's hemoglobin at baseline is anywhere between 7-9. She presented to the hospital with a hemoglobin of 7.5 and was symptomatic with relative hypotension. She was therefore transfused 1 unit of packed red blood cells and her hemoglobin has  improved.  - Patient does not want any aggressive GI workup given her advanced age therefore gastroenterology consult was not obtained. Patient follows up with Dr. Sherrlyn Hock as an outpatient from hematology and will continue follow-up for her chronic anemia. - He currently does not show any evidence of acute bleeding even though she was Hemoccult positive in the emergency room. - Patient is on iron supplements and she will continue that.    #3 cellulitis: She has chronic lymph edema. On examination she does have erythema and tenderness over both lower extremities which she and her daughter report is new.  - Patient is being discharged on oral Keflex for another 7 days.  #4 acute on chronic kidney disease stage IV: Patient's creatinine is close to baseline. She'll follow up with nephrology as an outpatient. She follows up with Dr. Cherylann Ratel  #5 chronic congestive heart failure: Clinically well in the hospital patient had no evidence of congestive heart failure. We'll continue her Lasix Cardizem Coreg as stated.  #6 seizure disorder: Patient had no acute seizures while in the hospital she will continue her Dilantin  #7 chronic pain - patient was maintained on her fentanyl patch and she will resume that.   DISCHARGE CONDITIONS:   Stable  CONSULTS OBTAINED:    none  DRUG ALLERGIES:   Allergies  Allergen Reactions  . Capsaicin   . Codeine   . Enalapril   . Hydrocodone   . Oxycodone   . Tramadol   . Vicodin [Hydrocodone-Acetaminophen]     DISCHARGE MEDICATIONS:  Current Discharge Medication List    START taking these medications   Details  cephALEXin (KEFLEX) 250 MG capsule Take 1 capsule (250 mg total) by mouth every 8 (eight) hours. Qty: 21 capsule, Refills: 0      CONTINUE these medications which have NOT CHANGED   Details  !! acetaminophen (TYLENOL) 325 MG tablet Take 650 mg by mouth every 4 (four) hours as needed for mild pain or fever.    !! acetaminophen (TYLENOL) 500  MG tablet Take 1,000 mg by mouth 3 (three) times daily.    aspirin EC 81 MG tablet Take 81 mg by mouth daily.    b complex-vitamin c-folic acid (NEPHRO-VITE) 0.8 MG TABS tablet Take 1 tablet by mouth daily.    carvedilol (COREG) 6.25 MG tablet Take 6.25 mg by mouth 2 (two) times daily.    cetirizine (ZYRTEC) 10 MG tablet Take 10 mg by mouth daily.    Coenzyme Q-10 100 MG capsule Take 300 mg by mouth daily.    diclofenac sodium (VOLTAREN) 1 % GEL Apply 2 g topically 4 (four) times daily. Apply to posterior neck and right shoulder until pain is relieved.    diltiazem (CARDIZEM CD) 240 MG 24 hr capsule Take 240 mg by mouth 2 (two) times daily.    docusate sodium (COLACE) 100 MG capsule Take 100 mg by mouth every other day. At bedtime    fentaNYL (DURAGESIC - DOSED MCG/HR) 50 MCG/HR Place 50 mcg onto the skin every 3 (three) days.    ferrous sulfate 325 (65 FE) MG tablet Take 325 mg by mouth 3 (three) times daily.    furosemide (LASIX) 40 MG tablet Take 40 mg by mouth daily.    gabapentin (NEURONTIN) 100 MG capsule Take 100 mg by mouth 3 (three) times daily.    hydrALAZINE (APRESOLINE) 50 MG tablet Take 50 mg by mouth 3 (three) times daily.    lidocaine (LIDODERM) 5 % Place 1 patch onto the skin daily. Remove & Discard patch within 12 hours or as directed by MD    loperamide (IMODIUM A-D) 2 MG tablet Take 2-4 mg by mouth 4 (four) times daily as needed for diarrhea or loose stools.  to be administered for loose stool and  after each subsequent loose stool, up to 8 doses in 24 hours.    metaxalone (SKELAXIN) 800 MG tablet Take 400 mg by mouth every 8 (eight) hours.    Multiple Vitamins-Minerals (PRESERVISION AREDS) CAPS Take 1 capsule by mouth 2 (two) times daily.     pantoprazole (PROTONIX) 40 MG tablet Take 40 mg by mouth 2 (two) times daily.    phenytoin (DILANTIN) 100 MG ER capsule Take 300 mg by mouth at bedtime.    polyvinyl alcohol-povidone (HYPOTEARS) 1.4-0.6 %  ophthalmic solution Place 2 drops into both eyes 4 (four) times daily as needed (dry, irritated eyes).    predniSONE (DELTASONE) 10 MG tablet Take 10 mg by mouth daily.    vitamin B-12 (CYANOCOBALAMIN) 1000 MCG tablet Take 1,000 mcg by mouth daily.     !! - Potential duplicate medications found. Please discuss with provider.       DISCHARGE INSTRUCTIONS:   DIET:  Cardiac diet  DISCHARGE CONDITION:  Stable  ACTIVITY:  Activity as tolerated  OXYGEN:  Home Oxygen: No.   Oxygen Delivery: room air  DISCHARGE LOCATION:  nursing home   If you experience worsening of your admission symptoms, develop shortness of breath, life threatening emergency, suicidal or homicidal thoughts you must  seek medical attention immediately by calling 911 or calling your MD immediately  if symptoms less severe.  You Must read complete instructions/literature along with all the possible adverse reactions/side effects for all the Medicines you take and that have been prescribed to you. Take any new Medicines after you have completely understood and accpet all the possible adverse reactions/side effects.   Please note  You were cared for by a hospitalist during your hospital stay. If you have any questions about your discharge medications or the care you received while you were in the hospital after you are discharged, you can call the unit and asked to speak with the hospitalist on call if the hospitalist that took care of you is not available. Once you are discharged, your primary care physician will handle any further medical issues. Please note that NO REFILLS for any discharge medications will be authorized once you are discharged, as it is imperative that you return to your primary care physician (or establish a relationship with a primary care physician if you do not have one) for your aftercare needs so that they can reassess your need for medications and monitor your lab values.     Today     VITAL SIGNS:  Blood pressure 193/50, pulse 56, temperature 97.8 F (36.6 C), temperature source Oral, resp. rate 20, height 5\' 5"  (1.651 m), weight 94.348 kg (208 lb), SpO2 96 %.  I/O:   Intake/Output Summary (Last 24 hours) at 12/28/14 1338 Last data filed at 12/28/14 1200  Gross per 24 hour  Intake   2056 ml  Output      0 ml  Net   2056 ml    PHYSICAL EXAMINATION:  GENERAL:  81 y.o.-year-old patient lying in the bed with no acute distress.  EYES: Pupils equal, round, reactive to light and accommodation. No scleral icterus. Extraocular muscles intact.  HEENT: Head atraumatic, normocephalic. Oropharynx and nasopharynx clear.  NECK:  Supple, no jugular venous distention. No thyroid enlargement, no tenderness.  LUNGS: Normal breath sounds bilaterally, no wheezing, rales,rhonchi. No use of accessory muscles of respiration.  CARDIOVASCULAR: S1, S2 normal. No murmurs, rubs, clicks, gallops.  ABDOMEN: Soft, non-tender, non-distended. Bowel sounds present. No organomegaly or mass.  EXTREMITIES: No pedal edema, cyanosis, or clubbing.  NEUROLOGIC: Cranial nerves II through XII are intact. No focal motor or sensory defecits b/l.  PSYCHIATRIC: The patient is alert and oriented x 3.  SKIN: No obvious rash, lesion, or ulcer.   DATA REVIEW:   CBC  Recent Labs Lab 12/28/14 0532  WBC 7.5  HGB 8.6*  HCT 26.3*  PLT 213    Chemistries   Recent Labs Lab 12/27/14 1021 12/28/14 0532  NA 139 140  K 4.5 4.1  CL 107 111  CO2 20* 20*  GLUCOSE 147* 82  BUN 72* 72*  CREATININE 2.42* 2.27*  CALCIUM 9.3 9.1  AST 16  --   ALT 13*  --   ALKPHOS 46  --   BILITOT 0.2*  --     Cardiac Enzymes  Recent Labs Lab 12/27/14 1021  TROPONINI <0.03    Microbiology Results  Results for orders placed or performed during the hospital encounter of 11/03/14  Culture, blood (single)     Status: None   Collection Time: 11/03/14  1:15 PM  Result Value Ref Range Status   Micro Text Report    Final       COMMENT  NO GROWTH AEROBICALLY/ANAEROBICALLY IN 5 DAYS   ANTIBIOTIC                                                      Culture, blood (single)     Status: None   Collection Time: 11/03/14  1:15 PM  Result Value Ref Range Status   Micro Text Report   Final       COMMENT                   NO GROWTH AEROBICALLY/ANAEROBICALLY IN 5 DAYS   ANTIBIOTIC                                                        RADIOLOGY:  Dg Chest 2 View  12/27/2014   CLINICAL DATA:  Near syncope.  Weakness.  Anemia.  EXAM: CHEST  2 VIEW  COMPARISON:  Dec 07, 2014  FINDINGS: The previously noted left effusion and left base atelectasis remains without change. No new opacity. Cardiomegaly is stable. The pulmonary vascularity is within normal limits. There is atherosclerotic change in aorta. No adenopathy. Bones are osteoporotic.  IMPRESSION: Chronic atelectasis left base with left effusion. No new opacity. Stable cardiomegaly. No appreciable change compared to prior study.   Electronically Signed   By: Bretta Bang III M.D.   On: 12/27/2014 11:13      Management plans discussed with the patient, family and they are in agreement.  CODE STATUS:     Code Status Orders        Start     Ordered   12/27/14 1451  Full code   Continuous     12/27/14 1450    Advance Directive Documentation        Most Recent Value   Type of Advance Directive  Healthcare Power of Attorney, Living will   Pre-existing out of facility DNR order (yellow form or pink MOST form)     "MOST" Form in Place?        TOTAL TIME TAKING CARE OF THIS PATIENT: 40 minutes.    Houston Siren M.D on 12/28/2014 at 1:38 PM  Between 7am to 6pm - Pager - [phone]  After 6pm go to [email or web address] - password [email or web address] [facility]  [facility]  Office  [phone]  CC: Primary care physician; Jaclyn Shaggy, MD

## 2014-12-28 NOTE — Clinical Social Work Note (Signed)
Clinical Social Work Assessment  Patient Details  Name: Kristina Wilson MRN: [id number] Date of Birth: 11/01/1922  Date of referral:  12/28/14               Reason for consult:  Facility Placement                Permission sought to share information with:  Family Supports Permission granted to share information::  Yes, Verbal Permission Granted  Pt's daughter  Housing/Transportation Living arrangements for the past 2 months:  [place] of Information:  Patient Patient Interpreter Needed:  None Criminal Activity/Legal Involvement Pertinent to Current Situation/Hospitalization:  No - Comment as needed Significant Relationships:  Adult Children Lives with:   [facility] Do you feel safe going back to the place where you live?  Yes Need for family participation in patient care:  No (Coment)  Care giving concerns:  None identified   Facilities manager / plan:  CSW met with to address consult. CSW introduced herself and explained role of social work. Pt is a current patient of [facility]. Pt shared that she would like to return. Per MD, pt is ready for discharge. Facility is able to accept pt as they have received discharge information. Pt's daughter will provide transportation. RN to call report. CSW is signing off as no further needs identified.   Employment status:  Retired Forensic scientist:  Medicare PT Recommendations:  Not assessed at this time [place] / Referral to community resources:  [place]  Patient/Family's Response to care:  Pt is agreeable to discharge plan.   Patient/Family's Understanding of and Emotional Response to Diagnosis, Current Treatment, and Prognosis:  Pt understands that she needs to return to SNF at discharge.   Emotional Assessment Appearance:  Appears stated age Attitude/Demeanor/Rapport:  Unable to Assess (Appropriate) Affect (typically observed):  Accepting Orientation:   Oriented to Self, Oriented to Place, Oriented to  Time, Oriented to Situation Alcohol / Substance use:  Never Used Psych involvement (Current and /or in the community):  No (Comment)  Discharge Needs  Concerns to be addressed:  No discharge needs identified Readmission within the last 30 days:  No Current discharge risk:  None Barriers to Discharge:  No Barriers Identified   Darden Dates, LCSW 12/28/2014, 2:47 PM

## 2014-12-28 NOTE — Care Management (Signed)
Admitted to [facility] with the diagnosis of acute encephalopathy. Currently in [facility] since April. [facility]. [facility] in the past. Lives with daughter, Celine AhrKelly [place], [phone]) normally. CPAP for the last 12 years. Home Health in the past. Doesn't remember name of company. No home oxygen. Uses rolling walker to aide in ambulation. Plans to go live with [place] in [place] when stable. Sees Dr. Dewaine Oatsenny Tate. Seen in the last year. Gwenette GreetBrenda S Holland RN MSN Care Management [phone]

## 2015-01-02 ENCOUNTER — Encounter
Admission: RE | Admit: 2015-01-02 | Discharge: 2015-01-02 | Disposition: A | Discharge status: Home / Self Care | Payer: Medicare Other | Source: Ambulatory Visit | Attending: Internal Medicine | Admitting: Internal Medicine

## 2015-01-07 ENCOUNTER — Other Ambulatory Visit
Admission: RE | Admit: 2015-01-07 | Discharge: 2015-01-07 | Disposition: A | Discharge status: Home / Self Care | Payer: Medicare Other | Source: Ambulatory Visit | Attending: Gerontology | Admitting: Gerontology

## 2015-01-07 DIAGNOSIS — R197 Diarrhea, unspecified: Secondary | ICD-10-CM | POA: Diagnosis present

## 2015-01-07 LAB — C DIFFICILE QUICK SCREEN W PCR REFLEX
C Diff antigen: NEGATIVE
C Diff interpretation: NEGATIVE
C Diff toxin: NEGATIVE

## 2015-01-07 LAB — OCCULT BLOOD X 1 CARD TO LAB, STOOL: FECAL OCCULT BLD: POSITIVE — AB

## 2015-01-09 ENCOUNTER — Inpatient Hospital Stay: Payer: Medicare Other

## 2015-01-09 ENCOUNTER — Inpatient Hospital Stay: Payer: Medicare Other | Attending: Internal Medicine | Admitting: Internal Medicine

## 2015-01-09 VITALS — BP 207/69 | HR 58 | Temp 97.3°F | Resp 20 | Ht 65.0 in | Wt 208.0 lb

## 2015-01-09 DIAGNOSIS — K219 Gastro-esophageal reflux disease without esophagitis: Secondary | ICD-10-CM | POA: Diagnosis not present

## 2015-01-09 DIAGNOSIS — N189 Chronic kidney disease, unspecified: Secondary | ICD-10-CM | POA: Insufficient documentation

## 2015-01-09 DIAGNOSIS — D649 Anemia, unspecified: Secondary | ICD-10-CM

## 2015-01-09 DIAGNOSIS — I129 Hypertensive chronic kidney disease with stage 1 through stage 4 chronic kidney disease, or unspecified chronic kidney disease: Secondary | ICD-10-CM | POA: Diagnosis not present

## 2015-01-09 DIAGNOSIS — Z8673 Personal history of transient ischemic attack (TIA), and cerebral infarction without residual deficits: Secondary | ICD-10-CM

## 2015-01-09 DIAGNOSIS — G4730 Sleep apnea, unspecified: Secondary | ICD-10-CM | POA: Diagnosis not present

## 2015-01-09 DIAGNOSIS — Z79899 Other long term (current) drug therapy: Secondary | ICD-10-CM | POA: Insufficient documentation

## 2015-01-09 DIAGNOSIS — M549 Dorsalgia, unspecified: Secondary | ICD-10-CM | POA: Diagnosis not present

## 2015-01-09 DIAGNOSIS — Z8701 Personal history of pneumonia (recurrent): Secondary | ICD-10-CM | POA: Diagnosis not present

## 2015-01-09 DIAGNOSIS — M1990 Unspecified osteoarthritis, unspecified site: Secondary | ICD-10-CM | POA: Diagnosis not present

## 2015-01-09 DIAGNOSIS — E785 Hyperlipidemia, unspecified: Secondary | ICD-10-CM | POA: Diagnosis not present

## 2015-01-09 DIAGNOSIS — D631 Anemia in chronic kidney disease: Secondary | ICD-10-CM | POA: Diagnosis not present

## 2015-01-09 DIAGNOSIS — Z87898 Personal history of other specified conditions: Secondary | ICD-10-CM | POA: Insufficient documentation

## 2015-01-09 DIAGNOSIS — Z7982 Long term (current) use of aspirin: Secondary | ICD-10-CM | POA: Insufficient documentation

## 2015-01-09 LAB — CBC WITH DIFFERENTIAL/PLATELET
Basophils Absolute: 0 10*3/uL (ref 0–0.1)
Basophils Relative: 0 %
EOS PCT: 1 %
Eosinophils Absolute: 0.1 10*3/uL (ref 0–0.7)
HCT: 28.8 % — ABNORMAL LOW (ref 35.0–47.0)
Hemoglobin: 9.3 g/dL — ABNORMAL LOW (ref 12.0–16.0)
Lymphocytes Relative: 14 %
Lymphs Abs: 1.1 10*3/uL (ref 1.0–3.6)
MCH: 32.3 pg (ref 26.0–34.0)
MCHC: 32.4 g/dL (ref 32.0–36.0)
MCV: 99.8 fL (ref 80.0–100.0)
MONOS PCT: 6 %
Monocytes Absolute: 0.5 10*3/uL (ref 0.2–0.9)
Neutro Abs: 6.4 10*3/uL (ref 1.4–6.5)
Neutrophils Relative %: 79 %
PLATELETS: 215 10*3/uL (ref 150–440)
RBC: 2.88 MIL/uL — ABNORMAL LOW (ref 3.80–5.20)
RDW: 14.4 % (ref 11.5–14.5)
WBC: 8.1 10*3/uL (ref 3.6–11.0)

## 2015-01-09 LAB — IRON AND TIBC
IRON: 101 ug/dL (ref 28–170)
Saturation Ratios: 43 % — ABNORMAL HIGH (ref 10.4–31.8)
TIBC: 236 ug/dL — ABNORMAL LOW (ref 250–450)
UIBC: 135 ug/dL

## 2015-01-09 LAB — FOLATE: FOLATE: 60.1 ng/mL (ref >5.9)

## 2015-01-09 LAB — SAMPLE TO BLOOD BANK

## 2015-01-09 LAB — VITAMIN B12: Vitamin B-12: 1120 pg/mL — ABNORMAL HIGH (ref 180–914)

## 2015-01-09 LAB — FERRITIN: FERRITIN: 48 ng/mL (ref 11–307)

## 2015-01-09 NOTE — Progress Notes (Signed)
Patient was discharged from the [facility] she is currently. Patient states that she has been feeling very weak and fatigued. She denies any ShOB. Patient's daughter states that nurse at [facility] check occult stool on Monday and it is still positive.

## 2015-01-10 LAB — PROTEIN ELECTROPHORESIS, SERUM
A/G RATIO SPE: 1.2 (ref 0.7–2.0)
ALPHA-1-GLOBULIN: 0.3 g/dL (ref 0.1–0.4)
ALPHA-2-GLOBULIN: 0.9 g/dL (ref 0.4–1.2)
Albumin ELP: 3.6 g/dL (ref 3.2–5.6)
Beta Globulin: 1 g/dL (ref 0.6–1.3)
GLOBULIN, TOTAL: 2.9 g/dL (ref 2.0–4.5)
Gamma Globulin: 0.8 g/dL (ref 0.5–1.6)
Total Protein ELP: 6.5 g/dL (ref 6.0–8.5)

## 2015-01-10 LAB — ERYTHROPOIETIN: Erythropoietin: 3.4 m[IU]/mL (ref 2.6–18.5)

## 2015-01-27 NOTE — Progress Notes (Signed)
[facility]  [phone] [phone]     ID: Kristina Wilson OB: [birth year]  MR#: [id number]  [id number]#:[id number]  Patient Care Team: Albina Billet, MD as PCP - General (Internal Medicine)  CHIEF COMPLAINT/DIAGNOSIS:  Symptomatic normocytic anemia along with renal insufficiency and hypercalcemia (hemoglobin 7.5, hematocrit 23.1, creatinine 2.34, calcium 10.6 on 04/21/13)  -   workup on 05/01/13 shows iron-deficiency anemia with ferritin 8, serum iron low at 39, iron sat low at 11%, TIBC 366. Serum EPO 12.8). Got 1 unit PRBC tx on 05/01/13.   Has anemia of iron deficiency and anemia of chronic renal insufficiency.   Started on Procrit mid-December 2014, Procrit discontinued after one dose since she had TIA.  Labs done on 05/01/13 -  Hemoglobin 7.5, hematocrit 23.1%, MCV normal at 96, WBC normal at 8300 with 44% neutrophils, 33% lymphocytes, 10% monocytes, 8% eosinophils, 1% basophils, platelets 285, creatinine 1.34.  Serum EPO 12.8. Ferritin 8, serum iron low at 39, iron saturation low at 11%, TIBC 366 Serum B12, folate, LDH, haptoglobin, ANA, direct Coombs test, SIEP all unremarkable.  Hospitalised May 26-27 for GI bleed    HISTORY OF PRESENT ILLNESS:  Patient returns for continued hematology followup, she was seen in June 2015. States that she continues to have chronic significant weakness and DOE. She was hospitalised May 26-27 for GI bleed and Hb was 8.6 past Monday, she was made appt to be seen here for continued close monitoring. Currently denies any obvious bleeding symptoms. No fever or chills. Denies any angina, orthopnea or PND.  Denies any new bone pains. She had started on Procrit mid-December 2014, Procrit discontinued after one dose since she had TIA.  REVIEW OF SYSTEMS:   ROS As in HPI above. In addition, no fevers or sweats. No new headaches. No new abdominal pain, constipation, diarrhea, dysuria or hematuria. No new skin rash or bleeding  symptoms. No new bone pains. No new paresthesias in extremities. No polyuria polydipsia.   PAST MEDICAL HISTORY: Reviewed. Past Medical History  Diagnosis Date  . Muscle weakness   . Chronic respiratory failure   . Pneumonia   . Sleep apnea   . CHF (congestive heart failure)   . Chronic kidney disease   . Dysphagia   . GERD (gastroesophageal reflux disease)   . Anemia   . Back pain   . Epilepsy   . TIA (transient ischemic attack)           Hypertension  Hyperlipid emia  Prediabetes  Renal insufficiency, felt to be chronic kidney disease stage IV  Hypercalcemia  Osteoarthritis, chronic knee pain  Chronic back pain due to DDD  History of seizure disorder  Generalized weakness and gait instability  Sleep apnea  Appendectomy  Hernia repair  Breast lumpectomy, denies cancer  PAST SURGICAL HISTORY: Reviewed. Past Surgical History  Procedure Laterality Date  . Back surgery    . Knee surgery    . Carotid endarterectomy    . Tonsillectomy      FAMILY HISTORY: Reviewed. Family History  Problem Relation Age of Onset  . CAD Mother   . CAD Father   . Anemia Father   . CVA Sister   . Multiple myeloma Brother     SOCIAL HISTORY: Reviewed. History  Substance Use Topics  . Smoking status: Former Research scientist (life sciences)  . Smokeless tobacco: Never Used  . Alcohol Use: No    Allergies  Allergen Reactions  . Capsaicin   . Codeine   .  Enalapril   . Hydrocodone   . Oxycodone   . Tramadol   . Vicodin [Hydrocodone-Acetaminophen]     Current Outpatient Prescriptions  Medication Sig Dispense Refill  . acetaminophen (TYLENOL) 325 MG tablet Take 650 mg by mouth every 4 (four) hours as needed for mild pain or fever.    Marland Kitchen acetaminophen (TYLENOL) 500 MG tablet Take 1,000 mg by mouth 3 (three) times daily.    Marland Kitchen aspirin EC 81 MG tablet Take 81 mg by mouth daily.    Marland Kitchen b complex-vitamin c-folic acid (NEPHRO-VITE) 0.8 MG TABS tablet Take 1 tablet by mouth daily.    . carvedilol (COREG) 6.25 MG  tablet Take 6.25 mg by mouth 2 (two) times daily.    . cetirizine (ZYRTEC) 10 MG tablet Take 10 mg by mouth daily.    . Coenzyme Q-10 100 MG capsule Take 300 mg by mouth daily.    . diclofenac sodium (VOLTAREN) 1 % GEL Apply 2 g topically 4 (four) times daily. Apply to posterior neck and right shoulder until pain is relieved.    . diltiazem (CARDIZEM CD) 240 MG 24 hr capsule Take 240 mg by mouth 2 (two) times daily.    Marland Kitchen docusate sodium (COLACE) 100 MG capsule Take 100 mg by mouth every other day. At bedtime    . fentaNYL (DURAGESIC - DOSED MCG/HR) 50 MCG/HR Place 50 mcg onto the skin every 3 (three) days.    . ferrous sulfate 325 (65 FE) MG tablet Take 325 mg by mouth 3 (three) times daily.    . furosemide (LASIX) 40 MG tablet Take 40 mg by mouth daily.    Marland Kitchen gabapentin (NEURONTIN) 100 MG capsule Take 100 mg by mouth 3 (three) times daily.    . hydrALAZINE (APRESOLINE) 50 MG tablet Take 50 mg by mouth 3 (three) times daily.    Marland Kitchen lidocaine (LIDODERM) 5 % Place 1 patch onto the skin daily. Remove & Discard patch within 12 hours or as directed by MD    . metaxalone (SKELAXIN) 800 MG tablet Take 400 mg by mouth every 8 (eight) hours.    . Multiple Vitamins-Minerals (PRESERVISION AREDS) CAPS Take 1 capsule by mouth 2 (two) times daily.     . OxyCODONE HCl, Abuse Deter, (OXAYDO) 5 MG TABA Take 5 mg by mouth every 3 (three) hours as needed.    . pantoprazole (PROTONIX) 40 MG tablet Take 40 mg by mouth 2 (two) times daily.    . phenytoin (DILANTIN) 100 MG ER capsule Take 300 mg by mouth at bedtime.    . polyvinyl alcohol-povidone (HYPOTEARS) 1.4-0.6 % ophthalmic solution Place 2 drops into both eyes 4 (four) times daily as needed (dry, irritated eyes).    . predniSONE (DELTASONE) 10 MG tablet Take 10 mg by mouth daily.    . vitamin B-12 (CYANOCOBALAMIN) 1000 MCG tablet Take 1,000 mcg by mouth daily.    . fentaNYL (DURAGESIC - DOSED MCG/HR) 50 MCG/HR Place 1 patch (50 mcg total) onto the skin every 3  (three) days. (Patient not taking: Reported on 01/09/2015) 5 patch 0   No current facility-administered medications for this visit.    PHYSICAL EXAM: Filed Vitals:   01/09/15 1629  BP: 207/69  Pulse:   Temp:   Resp:      Body mass index is 34.61 kg/(m^2).     GENERAL: Patient is alert and oriented and in no acute distress. There is no icterus. Mild pallor. HEENT: EOMs intact. No cervical lymphadenopathy. CVS: S1S2,  regular LUNGS: Bilaterally diminished BS, no rhonchi. ABDOMEN: Soft, nontender. No hepatosplenomegaly clinically.  EXTREMITIES: b/l mild pedal edema.   LAB RESULTS:  01/09/15 - Hb 9.3, WBC and platelets normal, folate 60.1, SPEP negative for M-spike, ferritin 48, iron sat 43%, serum Fe 101, TIBC low at 236.     Component Value Date/Time   NA 140 12/28/2014 0532   NA 134* 11/21/2014 0446   K 4.1 12/28/2014 0532   K 5.0 11/21/2014 0446   CL 111 12/28/2014 0532   CL 106 11/21/2014 0446   CO2 20* 12/28/2014 0532   CO2 25 11/21/2014 0446   GLUCOSE 82 12/28/2014 0532   GLUCOSE 92 11/21/2014 0446   BUN 72* 12/28/2014 0532   BUN 53* 11/21/2014 0446   CREATININE 2.27* 12/28/2014 0532   CREATININE 2.14* 11/21/2014 0446   CALCIUM 9.1 12/28/2014 0532   CALCIUM 9.0 11/21/2014 0446   PROT 5.9* 12/27/2014 1021   PROT 6.2* 11/19/2014 0926   ALBUMIN 3.0* 12/27/2014 1021   ALBUMIN 3.0* 11/19/2014 0926   AST 16 12/27/2014 1021   AST 20 11/19/2014 0926   ALT 13* 12/27/2014 1021   ALT 15 11/19/2014 0926   ALKPHOS 46 12/27/2014 1021   ALKPHOS 46 11/19/2014 0926   BILITOT 0.2* 12/27/2014 1021   GFRNONAA 18* 12/28/2014 0532   GFRNONAA 20* 11/21/2014 0446   GFRAA 21* 12/28/2014 0532   GFRAA 23* 11/21/2014 0446   Lab Results  Component Value Date   WBC 8.1 01/09/2015   NEUTROABS 6.4 01/09/2015   HGB 9.3* 01/09/2015   HCT 28.8* 01/09/2015   MCV 99.8 01/09/2015   PLT 215 01/09/2015     ASSESSMENT / PLAN:   Symptomatic normocytic anemia along with renal insufficiency  and hypercalcemia (Hb 7.5, Hct 23.1, Cr 2.34, calcium 10.6 on 04/21/13). Workup on 05/01/13 showed iron-deficiency anemia with ferritin 8, serum iron low at 39, iron sat low at 11%, TIBC 366. Serum EPO 12.8). Got 1 unit PRBC treatment on 05/01/13   -   Reviewed labs from today and d/w patient and daughter present. Anemia w/u done today shows Hb 9.3, WBC and platelets normal, folate 60.1, SPEP negative for M-spike, ferritin 48, iron sat 43%, serum Fe 101, TIBC low at 236. Patient continues to have chronic fatigue and dyspnea on exertion, but does not ambulate much or do physical activity. Hemoglobin is slightly better at 9.3 g today. Patient again explained that given episode of TIA in mid-December especially after starting 1st dose of Procrit therapy, she is not a candidate to receive any further ESA therapy like Procrit(, she also does not want to consider ESA therapy. If she develops iron deficiency, we can definitely try correcting this early with parenteral iron therapy, iron studies today is more c/w anemia of chronic disease.  Plan therefore is to continue monitoring of hemoglobin once every 4 weeks and consider transfusion support if hemoglobin is 7 or less. Monitor iron studies q12 weeks.  Next MD followup at 20 weeks with labs and make further treatment planning. In between visits, the patient was advised to call or come to the ER in case of progressive anemia symptoms or acute sickness.  They are agreeable to this plan.    Leia Alf, MD   01/27/2015 3:25 PM

## 2015-02-01 ENCOUNTER — Other Ambulatory Visit: Payer: Self-pay

## 2015-02-01 DIAGNOSIS — D638 Anemia in other chronic diseases classified elsewhere: Secondary | ICD-10-CM

## 2015-02-02 ENCOUNTER — Emergency Department
Admission: EM | Admit: 2015-02-02 | Discharge: 2015-02-02 | Disposition: A | Discharge status: Home / Self Care | Payer: Medicare Other | Attending: Emergency Medicine | Admitting: Emergency Medicine

## 2015-02-02 ENCOUNTER — Emergency Department: Payer: Medicare Other

## 2015-02-02 ENCOUNTER — Other Ambulatory Visit: Payer: Self-pay

## 2015-02-02 DIAGNOSIS — Z87891 Personal history of nicotine dependence: Secondary | ICD-10-CM | POA: Diagnosis not present

## 2015-02-02 DIAGNOSIS — R2243 Localized swelling, mass and lump, lower limb, bilateral: Secondary | ICD-10-CM | POA: Diagnosis not present

## 2015-02-02 DIAGNOSIS — N184 Chronic kidney disease, stage 4 (severe): Secondary | ICD-10-CM | POA: Diagnosis not present

## 2015-02-02 DIAGNOSIS — Z79899 Other long term (current) drug therapy: Secondary | ICD-10-CM | POA: Diagnosis not present

## 2015-02-02 DIAGNOSIS — Z7982 Long term (current) use of aspirin: Secondary | ICD-10-CM | POA: Insufficient documentation

## 2015-02-02 DIAGNOSIS — I129 Hypertensive chronic kidney disease with stage 1 through stage 4 chronic kidney disease, or unspecified chronic kidney disease: Secondary | ICD-10-CM | POA: Diagnosis not present

## 2015-02-02 DIAGNOSIS — Z7952 Long term (current) use of systemic steroids: Secondary | ICD-10-CM | POA: Diagnosis not present

## 2015-02-02 DIAGNOSIS — I10 Essential (primary) hypertension: Secondary | ICD-10-CM

## 2015-02-02 LAB — TROPONIN I: Troponin I: 0.03 ng/mL (ref <0.031)

## 2015-02-02 LAB — BASIC METABOLIC PANEL
Anion gap: 11 (ref 5–15)
BUN: 56 mg/dL — ABNORMAL HIGH (ref 6–20)
CO2: 22 mmol/L (ref 22–32)
CREATININE: 2.59 mg/dL — AB (ref 0.44–1.00)
Calcium: 9.3 mg/dL (ref 8.9–10.3)
Chloride: 104 mmol/L (ref 101–111)
GFR calc Af Amer: 18 mL/min — ABNORMAL LOW (ref >60)
GFR, EST NON AFRICAN AMERICAN: 15 mL/min — AB (ref >60)
Glucose, Bld: 100 mg/dL — ABNORMAL HIGH (ref 65–99)
Potassium: 4.7 mmol/L (ref 3.5–5.1)
SODIUM: 137 mmol/L (ref 135–145)

## 2015-02-02 LAB — CBC WITH DIFFERENTIAL/PLATELET
BASOS ABS: 0.1 10*3/uL (ref 0–0.1)
Basophils Relative: 1 %
Eosinophils Absolute: 0.2 10*3/uL (ref 0–0.7)
Eosinophils Relative: 2 %
HCT: 25.3 % — ABNORMAL LOW (ref 35.0–47.0)
Hemoglobin: 8.2 g/dL — ABNORMAL LOW (ref 12.0–16.0)
LYMPHS PCT: 15 %
Lymphs Abs: 1.5 10*3/uL (ref 1.0–3.6)
MCH: 31.8 pg (ref 26.0–34.0)
MCHC: 32.5 g/dL (ref 32.0–36.0)
MCV: 97.9 fL (ref 80.0–100.0)
MONO ABS: 0.9 10*3/uL (ref 0.2–0.9)
MONOS PCT: 10 %
NEUTROS ABS: 7.3 10*3/uL — AB (ref 1.4–6.5)
Neutrophils Relative %: 72 %
Platelets: 216 10*3/uL (ref 150–440)
RBC: 2.59 MIL/uL — AB (ref 3.80–5.20)
RDW: 13.6 % (ref 11.5–14.5)
WBC: 9.9 10*3/uL (ref 3.6–11.0)

## 2015-02-02 LAB — URINALYSIS COMPLETE WITH MICROSCOPIC (ARMC ONLY)
Bilirubin Urine: NEGATIVE
Glucose, UA: 50 mg/dL — AB
Hgb urine dipstick: NEGATIVE
Ketones, ur: NEGATIVE mg/dL
LEUKOCYTES UA: NEGATIVE
Nitrite: NEGATIVE
Protein, ur: 100 mg/dL — AB
SPECIFIC GRAVITY, URINE: 1.011 (ref 1.005–1.030)
Squamous Epithelial / LPF: NONE SEEN
pH: 5 (ref 5.0–8.0)

## 2015-02-02 LAB — BRAIN NATRIURETIC PEPTIDE: B Natriuretic Peptide: 228 pg/mL — ABNORMAL HIGH (ref 0.0–100.0)

## 2015-02-02 NOTE — Discharge Instructions (Signed)
Continue current blood pressure medications as discussed. Take an additional half of clonidine and an additional hole hydralazine if needed for blood pressure readings that are escalated. Follow-up with Dr. Arlana Pouchate and with Dr. Cherylann RatelLateef Return to the emergency department if you have further symptoms or urgent concerns.  Chronic Kidney Disease Chronic kidney disease occurs when the kidneys are damaged over a long period. The kidneys are two organs that lie on either side of the spine between the middle of the back and the front of the abdomen. The kidneys:   Remove wastes and extra water from the blood.   Produce important hormones. These help keep bones strong, regulate blood pressure, and help create red blood cells.   Balance the fluids and chemicals in the blood and tissues. A small amount of kidney damage may not cause problems, but a large amount of damage may make it difficult or impossible for the kidneys to work the way they should. If steps are not taken to slow down the kidney damage or stop it from getting worse, the kidneys may stop working permanently. Most of the time, chronic kidney disease does not go away. However, it can often be controlled, and those with the disease can usually live normal lives. CAUSES  The most common causes of chronic kidney disease are diabetes and high blood pressure (hypertension). Chronic kidney disease may also be caused by:   Diseases that cause the kidneys' filters to become inflamed.   Diseases that affect the immune system.   Genetic diseases.   Medicines that damage the kidneys, such as anti-inflammatory medicines.  Poisoning or exposure to toxic substances.   A reoccurring kidney or urinary infection.   A problem with urine flow. This may be caused by:   Cancer.   Kidney stones.   An enlarged prostate in males. SIGNS AND SYMPTOMS  Because the kidney damage in chronic kidney disease occurs slowly, symptoms develop slowly and  may not be obvious until the kidney damage becomes severe. A person may have a kidney disease for years without showing any symptoms. Symptoms can include:   Swelling (edema) of the legs, ankles, or feet.   Tiredness (lethargy).   Nausea or vomiting.   Confusion.   Problems with urination, such as:   Decreased urine production.   Frequent urination, especially at night.   Frequent accidents in children who are potty trained.   Muscle twitches and cramps.   Shortness of breath.  Weakness.   Persistent itchiness.   Loss of appetite.  Metallic taste in the mouth.  Trouble sleeping.  Slowed development in children.  Short stature in children. DIAGNOSIS  Chronic kidney disease may be detected and diagnosed by tests, including blood, urine, imaging, or kidney biopsy tests.  TREATMENT  Most chronic kidney diseases cannot be cured. Treatment usually involves relieving symptoms and preventing or slowing the progression of the disease. Treatment may include:   A special diet. You may need to avoid alcohol and foods thatare salty and high in potassium.   Medicines. These may:   Lower blood pressure.   Relieve anemia.   Relieve swelling.   Protect the bones. HOME CARE INSTRUCTIONS   Follow your prescribed diet.   Take medicines only as directed by your health care provider. Do not take any new medicines (prescription, over-the-counter, or nutritional supplements) unless approved by your health care provider. Many medicines can worsen your kidney damage or need to have the dose adjusted.   Quit smoking if you smoke.  Talk to your health care provider about a smoking cessation program.   Keep all follow-up visits as directed by your health care provider. SEEK IMMEDIATE MEDICAL CARE IF:  Your symptoms get worse or you develop new symptoms.   You develop symptoms of end-stage kidney disease. These include:   Headaches.   Abnormally dark or  light skin.   Numbness in the hands or feet.   Easy bruising.   Frequent hiccups.   Menstruation stops.   You have a fever.   You have decreased urine production.   You havepain or bleeding when urinating. MAKE SURE YOU:  Understand these instructions.  Will watch your condition.  Will get help right away if you are not doing well or get worse. FOR MORE INFORMATION   American Association of Kidney Patients: ResidentialShow.is  National Kidney Foundation: www.kidney.org  American Kidney Fund: FightingMatch.com.ee  Life [employer] [facility]: www.lifeoptions.org and www.kidneyschool.org Document Released: 04/28/2008 Document Revised: 12/04/2013 Document Reviewed: 03/18/2012 [facility] Patient Information 2015 [place], [place]. This information is not intended to replace advice given to you by your health care provider. Make sure you discuss any questions you have with your health care provider.

## 2015-02-02 NOTE — ED Notes (Signed)
Pt presents via EMS with c/o high blood pressure x 2 weeks.  Today she c/o general weakness and fatigue, and dizziness (only with standing) She reports that her medications have been adjusted by PMD, but today even after additional meds her BP was still greater than 200.

## 2015-02-02 NOTE — ED Provider Notes (Signed)
[facility] Emergency Department Provider Note  ____________________________________________  Time seen:   I have reviewed the triage vital signs and the nursing notes.   HISTORY  Chief Complaint Hypertension  hypertension, blurred vision    HPI Kristina Wilson is a 81 y.o. female with a history of hypertension who presents with concerns for her blood pressure. It has been running over 009 systolic much of the day. She is on multiple antihypertensive medications which she has been taking. She reports she often only takes half dose of the clonidine and a half dose of the hydralazine, but today she has been taking a full dose to try to reduce her blood pressure.  She denies chest pain, shortness of breath, nausea, vomiting, or abdominal pain. She does report she has some blurred vision that started today as well as some general fatigue started today.   Her primary physician is Brandt Loosen. She does have a history of some hematologic problem which is unclear to me currently. She sees Dr. Ma Hillock for this.   Past Medical History  Diagnosis Date  . Muscle weakness   . Chronic respiratory failure   . Pneumonia   . Sleep apnea   . CHF (congestive heart failure)   . Chronic kidney disease   . Dysphagia   . GERD (gastroesophageal reflux disease)   . Anemia   . Back pain   . Epilepsy   . TIA (transient ischemic attack)     Patient Active Problem List   Diagnosis Date Noted  . GI bleed 12/27/2014  . Hypotension 12/27/2014  . Anemia of chronic disease 12/27/2014  . Acute posthemorrhagic anemia 12/27/2014  . Acute renal failure superimposed on stage 4 chronic kidney disease 12/27/2014  . Cellulitis 12/27/2014    Past Surgical History  Procedure Laterality Date  . Back surgery    . Knee surgery    . Carotid endarterectomy    . Tonsillectomy    . Appendectomy      Current Outpatient Rx  Name  Route  Sig  Dispense  Refill  . acetaminophen  (TYLENOL) 325 MG tablet   Oral   Take 650 mg by mouth every 4 (four) hours as needed for mild pain or fever.         Marland Kitchen acetaminophen (TYLENOL) 500 MG tablet   Oral   Take 1,000 mg by mouth 3 (three) times daily.         Marland Kitchen aspirin EC 81 MG tablet   Oral   Take 81 mg by mouth daily.         Marland Kitchen b complex-vitamin c-folic acid (NEPHRO-VITE) 0.8 MG TABS tablet   Oral   Take 1 tablet by mouth daily.         . carvedilol (COREG) 6.25 MG tablet   Oral   Take 6.25 mg by mouth 2 (two) times daily.         . cetirizine (ZYRTEC) 10 MG tablet   Oral   Take 10 mg by mouth daily.         . Coenzyme Q-10 100 MG capsule   Oral   Take 300 mg by mouth daily.         . diclofenac sodium (VOLTAREN) 1 % GEL   Topical   Apply 2 g topically 4 (four) times daily. Apply to posterior neck and right shoulder until pain is relieved.         . diltiazem (CARDIZEM CD) 240 MG 24 hr  capsule   Oral   Take 240 mg by mouth 2 (two) times daily.         Marland Kitchen docusate sodium (COLACE) 100 MG capsule   Oral   Take 100 mg by mouth every other day. At bedtime         . fentaNYL (DURAGESIC - DOSED MCG/HR) 50 MCG/HR   Transdermal   Place 50 mcg onto the skin every 3 (three) days.         . fentaNYL (DURAGESIC - DOSED MCG/HR) 50 MCG/HR   Transdermal   Place 1 patch (50 mcg total) onto the skin every 3 (three) days. Patient not taking: Reported on 01/09/2015   5 patch   0   . ferrous sulfate 325 (65 FE) MG tablet   Oral   Take 325 mg by mouth 3 (three) times daily.         . furosemide (LASIX) 40 MG tablet   Oral   Take 40 mg by mouth daily.         Marland Kitchen gabapentin (NEURONTIN) 100 MG capsule   Oral   Take 100 mg by mouth 3 (three) times daily.         . hydrALAZINE (APRESOLINE) 50 MG tablet   Oral   Take 50 mg by mouth 3 (three) times daily.         Marland Kitchen lidocaine (LIDODERM) 5 %   Transdermal   Place 1 patch onto the skin daily. Remove & Discard patch within 12 hours or as  directed by MD         . metaxalone (SKELAXIN) 800 MG tablet   Oral   Take 400 mg by mouth every 8 (eight) hours.         . Multiple Vitamins-Minerals (PRESERVISION AREDS) CAPS   Oral   Take 1 capsule by mouth 2 (two) times daily.          . OxyCODONE HCl, Abuse Deter, (OXAYDO) 5 MG TABA   Oral   Take 5 mg by mouth every 3 (three) hours as needed.         . pantoprazole (PROTONIX) 40 MG tablet   Oral   Take 40 mg by mouth 2 (two) times daily.         . phenytoin (DILANTIN) 100 MG ER capsule   Oral   Take 300 mg by mouth at bedtime.         . polyvinyl alcohol-povidone (HYPOTEARS) 1.4-0.6 % ophthalmic solution   Both Eyes   Place 2 drops into both eyes 4 (four) times daily as needed (dry, irritated eyes).         . predniSONE (DELTASONE) 10 MG tablet   Oral   Take 10 mg by mouth daily.         . vitamin B-12 (CYANOCOBALAMIN) 1000 MCG tablet   Oral   Take 1,000 mcg by mouth daily.           Allergies Capsaicin; Codeine; Enalapril; Hydrocodone; Oxycodone; Tramadol; and Vicodin  Family History  Problem Relation Age of Onset  . CAD Mother   . CAD Father   . Anemia Father   . CVA Sister   . Multiple myeloma Brother     Social History History  Substance Use Topics  . Smoking status: Former Smoker -- 2.00 packs/day for 20 years    Types: Cigarettes  . Smokeless tobacco: Never Used  . Alcohol Use: No    Review of Systems  Constitutional: Negative  for fever. Positive for general fatigue and weakness. ENT: Negative for sore throat. Cardiovascular: Negative for chest pain. Positive for hypertension, acute on chronic. Respiratory: Negative for shortness of breath. Gastrointestinal: Negative for abdominal pain, vomiting and diarrhea. Genitourinary: Negative for dysuria. Musculoskeletal: No myalgias or injuries. Patient does have some noted edema bilateral legs. Skin: Positive for ecchymotic areas on her legs which the patient reports are  chronic.Marland Kitchen Neurological: Negative for headaches   10-point ROS otherwise negative.  ____________________________________________   PHYSICAL EXAM:  VITAL SIGNS: ED Triage Vitals  Enc Vitals Group     BP --      Pulse --      Resp --      Temp --      Temp src --      SpO2 --      Weight --      Height --      Head Cir --      Peak Flow --      Pain Score --      Pain Loc --      Pain Edu? --      Excl. in [place]? --     Constitutional:  Alert and oriented. Well appearing and in no distress. ENT   Head: Normocephalic and atraumatic.   Nose: No congestion/rhinnorhea.   Mouth/Throat: Mucous membranes are moist. Cardiovascular: Normal rate, regular rhythm, no murmur noted Respiratory:  Normal respiratory effort, no tachypnea.    Breath sounds are clear and equal bilaterally.  Gastrointestinal: Large abdomen but Soft and nontender. No distention.  Back: No muscle spasm, no tenderness, no CVA tenderness. Musculoskeletal: No deformity noted. Nontender. Noted edema, 1+, bilaterally.  Neurologic:  Normal speech and language. No gross focal neurologic deficits are appreciated.  Skin:  Skin is warm, dry. No rash noted. There are areas that look ecchymotic over bilateral knees. She has some nonpalpable purple streaks on the anterior surface of her ankle on the right. Psychiatric: Mood and affect are normal. Speech and behavior are normal.  ____________________________________________    LABS (pertinent positives/negatives)  Urinalysis negative CBC with hemoglobin of 8.2. Otherwise reasonable Electrolytes are okay, glucose of 100, BUN and creatinine are both elevated at 56 and 2.59 respectively. BNP of 228 Troponin is negative. ____________________________________________   EKG  ED ECG REPORT I, Zakk Borgen W, the attending physician, personally viewed and interpreted this ECG.   Date: 02/02/2015  EKG Time: 2103  Rate: 63  Rhythm: sinus with first-degree AV  block  Axis: Normal  Intervals: first-degree block with a PR of 224  ST&T Change: downward T-wave in lead 3   ____________________________________________    RADIOLOGY  Chest x-ray: cardiomegaly. No infiltrate.  ____________________________________________ ____________________________________________   INITIAL IMPRESSION / ASSESSMENT AND PLAN / ED COURSE  Pertinent labs & imaging results that were available during my care of the patient were reviewed by me and considered in my medical decision making (see chart for details).  Alert, pleasant, insightful 81 year old female in no acute distress. Her blood pressure upon my initial review was 154/62, but on recheck went up to 205/57.  She has some blurred vision and general fatigue. We will check her kidney function, electrolytes, blood count, and a urinalysis.  With her blood pressure ranging some, we will not treat at this moment. She has already taken her various blood pressure medicines at home earlier today.  ----------------------------------------- 10:28 PM on 02/02/2015 -----------------------------------------  Upon further review approximate hour ago, the patient was due for her  8 PM medications. This included numerous blood pressure medications. We had her take her regular medicines that she had brought from home.  At this time, her results look good except for her renal function. This is not new. Her more recent BUNs were in the 46s and she has recent BUNs before those that were in the 85s..   Reassessment at this time finds her blood pressure more reasonable at 174/52. The patient is alert and communicative and doing well. Her daughter is here an IV had a long conversation with her and the patient. The patient has seen Dr. Holley Raring, nephrology, recently. Her kidney dysfunction has been present for over 3 or 4 years at least.  We will discharge the patient home. She'll continue her current antihypertensives. We've given  instructions on additional antihypertensives to take in case blood pressure is further escalated. She'll return to the emergency department if she has any further acute symptoms.   ____________________________________________   FINAL CLINICAL IMPRESSION(S) / ED DIAGNOSES  Final diagnoses:  Essential hypertension  Chronic kidney disease, stage 4 (severe)       Ahmed Prima, MD 02/02/15 2249

## 2015-02-06 ENCOUNTER — Inpatient Hospital Stay: Payer: Medicare Other | Attending: Internal Medicine

## 2015-02-06 DIAGNOSIS — D649 Anemia, unspecified: Secondary | ICD-10-CM | POA: Diagnosis present

## 2015-02-06 DIAGNOSIS — N189 Chronic kidney disease, unspecified: Secondary | ICD-10-CM | POA: Diagnosis not present

## 2015-02-06 DIAGNOSIS — D638 Anemia in other chronic diseases classified elsewhere: Secondary | ICD-10-CM

## 2015-02-06 DIAGNOSIS — Z79899 Other long term (current) drug therapy: Secondary | ICD-10-CM | POA: Insufficient documentation

## 2015-02-06 DIAGNOSIS — D631 Anemia in chronic kidney disease: Secondary | ICD-10-CM | POA: Diagnosis not present

## 2015-02-06 LAB — SAMPLE TO BLOOD BANK

## 2015-02-06 LAB — HEMOGLOBIN: HEMOGLOBIN: 9.4 g/dL — AB (ref 12.0–16.0)

## 2015-03-03 ENCOUNTER — Emergency Department: Payer: Medicare Other

## 2015-03-03 ENCOUNTER — Emergency Department
Admission: EM | Admit: 2015-03-03 | Discharge: 2015-03-03 | Disposition: A | Discharge status: Home / Self Care | Payer: Medicare Other | Attending: Emergency Medicine | Admitting: Emergency Medicine

## 2015-03-03 DIAGNOSIS — R531 Weakness: Secondary | ICD-10-CM | POA: Diagnosis present

## 2015-03-03 DIAGNOSIS — L03119 Cellulitis of unspecified part of limb: Secondary | ICD-10-CM | POA: Diagnosis not present

## 2015-03-03 DIAGNOSIS — Z87891 Personal history of nicotine dependence: Secondary | ICD-10-CM | POA: Insufficient documentation

## 2015-03-03 DIAGNOSIS — Z792 Long term (current) use of antibiotics: Secondary | ICD-10-CM | POA: Insufficient documentation

## 2015-03-03 DIAGNOSIS — Z7982 Long term (current) use of aspirin: Secondary | ICD-10-CM | POA: Insufficient documentation

## 2015-03-03 DIAGNOSIS — Z79811 Long term (current) use of aromatase inhibitors: Secondary | ICD-10-CM | POA: Insufficient documentation

## 2015-03-03 DIAGNOSIS — Z791 Long term (current) use of non-steroidal anti-inflammatories (NSAID): Secondary | ICD-10-CM | POA: Insufficient documentation

## 2015-03-03 DIAGNOSIS — Z79899 Other long term (current) drug therapy: Secondary | ICD-10-CM | POA: Diagnosis not present

## 2015-03-03 LAB — CBC WITH DIFFERENTIAL/PLATELET
BASOS ABS: 0.1 10*3/uL (ref 0–0.1)
BASOS PCT: 1 %
Eosinophils Absolute: 0 10*3/uL (ref 0–0.7)
Eosinophils Relative: 0 %
HEMATOCRIT: 25.7 % — AB (ref 35.0–47.0)
Hemoglobin: 8.5 g/dL — ABNORMAL LOW (ref 12.0–16.0)
Lymphocytes Relative: 13 %
Lymphs Abs: 1.2 10*3/uL (ref 1.0–3.6)
MCH: 32 pg (ref 26.0–34.0)
MCHC: 33.2 g/dL (ref 32.0–36.0)
MCV: 96.5 fL (ref 80.0–100.0)
Monocytes Absolute: 0.6 10*3/uL (ref 0.2–0.9)
Monocytes Relative: 6 %
NEUTROS ABS: 7.6 10*3/uL — AB (ref 1.4–6.5)
Neutrophils Relative %: 80 %
PLATELETS: 358 10*3/uL (ref 150–440)
RBC: 2.67 MIL/uL — ABNORMAL LOW (ref 3.80–5.20)
RDW: 13.3 % (ref 11.5–14.5)
WBC: 9.5 10*3/uL (ref 3.6–11.0)

## 2015-03-03 LAB — URINALYSIS COMPLETE WITH MICROSCOPIC (ARMC ONLY)
BACTERIA UA: NONE SEEN
BILIRUBIN URINE: NEGATIVE
GLUCOSE, UA: 50 mg/dL — AB
Hgb urine dipstick: NEGATIVE
Ketones, ur: NEGATIVE mg/dL
Leukocytes, UA: NEGATIVE
Nitrite: NEGATIVE
PH: 6 (ref 5.0–8.0)
Protein, ur: 100 mg/dL — AB
SPECIFIC GRAVITY, URINE: 1.008 (ref 1.005–1.030)

## 2015-03-03 LAB — COMPREHENSIVE METABOLIC PANEL
ALK PHOS: 46 U/L (ref 38–126)
ALT: 9 U/L — ABNORMAL LOW (ref 14–54)
AST: 14 U/L — ABNORMAL LOW (ref 15–41)
Albumin: 2.9 g/dL — ABNORMAL LOW (ref 3.5–5.0)
Anion gap: 10 (ref 5–15)
BILIRUBIN TOTAL: 0.6 mg/dL (ref 0.3–1.2)
BUN: 47 mg/dL — AB (ref 6–20)
CHLORIDE: 102 mmol/L (ref 101–111)
CO2: 22 mmol/L (ref 22–32)
Calcium: 9.4 mg/dL (ref 8.9–10.3)
Creatinine, Ser: 2.55 mg/dL — ABNORMAL HIGH (ref 0.44–1.00)
GFR calc Af Amer: 18 mL/min — ABNORMAL LOW (ref >60)
GFR calc non Af Amer: 16 mL/min — ABNORMAL LOW (ref >60)
Glucose, Bld: 122 mg/dL — ABNORMAL HIGH (ref 65–99)
Potassium: 4.5 mmol/L (ref 3.5–5.1)
SODIUM: 134 mmol/L — AB (ref 135–145)
Total Protein: 5.9 g/dL — ABNORMAL LOW (ref 6.5–8.1)

## 2015-03-03 LAB — TROPONIN I

## 2015-03-03 MED ORDER — DOXYCYCLINE HYCLATE 100 MG PO TABS
100.0000 mg | ORAL_TABLET | Freq: Once | ORAL | Status: AC
  Administered 2015-03-03: 100 mg via ORAL
  Filled 2015-03-03: qty 1

## 2015-03-03 MED ORDER — DOXYCYCLINE HYCLATE 100 MG PO TABS: 100.0000 mg | ORAL_TABLET | Freq: Two times a day (BID) | ORAL | Status: DC

## 2015-03-03 NOTE — Discharge Instructions (Signed)

## 2015-03-03 NOTE — ED Provider Notes (Addendum)
[facility] Emergency Department Provider Note  ____________________________________________  Time seen: Approximately 4 PM  I have reviewed the triage vital signs and the nursing notes.   HISTORY  Chief Complaint Weakness    HPI Kristina Wilson is a 81 y.o. female with a history of CHF and chronic kidney disease who presents today with diffuse weakness since this morning. Also reports of burning with urination. Denies any pain, shortness of breath, nausea vomiting or diarrhea. Denies any focal weakness.    Past Medical History  Diagnosis Date  . Muscle weakness   . Chronic respiratory failure   . Pneumonia   . Sleep apnea   . CHF (congestive heart failure)   . Chronic kidney disease   . Dysphagia   . GERD (gastroesophageal reflux disease)   . Anemia   . Back pain   . Epilepsy   . TIA (transient ischemic attack)     Patient Active Problem List   Diagnosis Date Noted  . GI bleed 12/27/2014  . Hypotension 12/27/2014  . Anemia of chronic disease 12/27/2014  . Acute posthemorrhagic anemia 12/27/2014  . Acute renal failure superimposed on stage 4 chronic kidney disease 12/27/2014  . Cellulitis 12/27/2014    Past Surgical History  Procedure Laterality Date  . Back surgery    . Knee surgery    . Carotid endarterectomy    . Tonsillectomy    . Appendectomy      Current Outpatient Rx  Name  Route  Sig  Dispense  Refill  . acetaminophen (TYLENOL) 325 MG tablet   Oral   Take 650 mg by mouth every 4 (four) hours as needed for mild pain or fever.         Marland Kitchen acetaminophen (TYLENOL) 500 MG tablet   Oral   Take 1,000 mg by mouth 3 (three) times daily.         Marland Kitchen aspirin EC 81 MG tablet   Oral   Take 81 mg by mouth daily.         Marland Kitchen b complex-vitamin c-folic acid (NEPHRO-VITE) 0.8 MG TABS tablet   Oral   Take 1 tablet by mouth daily.         . carvedilol (COREG) 6.25 MG tablet   Oral   Take 6.25 mg by mouth 2 (two) times  daily.         . cetirizine (ZYRTEC) 10 MG tablet   Oral   Take 10 mg by mouth daily.         . Coenzyme Q-10 100 MG capsule   Oral   Take 300 mg by mouth daily.         . diclofenac sodium (VOLTAREN) 1 % GEL   Topical   Apply 2 g topically 4 (four) times daily. Apply to posterior neck and right shoulder until pain is relieved.         . diltiazem (CARDIZEM CD) 240 MG 24 hr capsule   Oral   Take 240 mg by mouth 2 (two) times daily.         Marland Kitchen docusate sodium (COLACE) 100 MG capsule   Oral   Take 100 mg by mouth every other day. At bedtime         . doxycycline (VIBRA-TABS) 100 MG tablet   Oral   Take 1 tablet (100 mg total) by mouth 2 (two) times daily.   20 tablet   0   . fentaNYL (DURAGESIC - DOSED MCG/HR) 50 MCG/HR  Transdermal   Place 50 mcg onto the skin every 3 (three) days.         . fentaNYL (DURAGESIC - DOSED MCG/HR) 50 MCG/HR   Transdermal   Place 1 patch (50 mcg total) onto the skin every 3 (three) days. Patient not taking: Reported on 01/09/2015   5 patch   0   . ferrous sulfate 325 (65 FE) MG tablet   Oral   Take 325 mg by mouth 3 (three) times daily.         . furosemide (LASIX) 40 MG tablet   Oral   Take 40 mg by mouth daily.         Marland Kitchen gabapentin (NEURONTIN) 100 MG capsule   Oral   Take 100 mg by mouth 3 (three) times daily.         . hydrALAZINE (APRESOLINE) 50 MG tablet   Oral   Take 50 mg by mouth 3 (three) times daily.         Marland Kitchen lidocaine (LIDODERM) 5 %   Transdermal   Place 1 patch onto the skin daily. Remove & Discard patch within 12 hours or as directed by MD         . metaxalone (SKELAXIN) 800 MG tablet   Oral   Take 400 mg by mouth every 8 (eight) hours.         . Multiple Vitamins-Minerals (PRESERVISION AREDS) CAPS   Oral   Take 1 capsule by mouth 2 (two) times daily.          . OxyCODONE HCl, Abuse Deter, (OXAYDO) 5 MG TABA   Oral   Take 5 mg by mouth every 3 (three) hours as needed.         .  pantoprazole (PROTONIX) 40 MG tablet   Oral   Take 40 mg by mouth 2 (two) times daily.         . phenytoin (DILANTIN) 100 MG ER capsule   Oral   Take 300 mg by mouth at bedtime.         . polyvinyl alcohol-povidone (HYPOTEARS) 1.4-0.6 % ophthalmic solution   Both Eyes   Place 2 drops into both eyes 4 (four) times daily as needed (dry, irritated eyes).         . predniSONE (DELTASONE) 10 MG tablet   Oral   Take 10 mg by mouth daily.         . vitamin B-12 (CYANOCOBALAMIN) 1000 MCG tablet   Oral   Take 1,000 mcg by mouth daily.           Allergies Capsaicin; Codeine; Enalapril; Hydrocodone; Oxycodone; Tramadol; and Vicodin  Family History  Problem Relation Age of Onset  . CAD Mother   . CAD Father   . Anemia Father   . CVA Sister   . Multiple myeloma Brother     Social History History  Substance Use Topics  . Smoking status: Former Smoker -- 2.00 packs/day for 20 years    Types: Cigarettes  . Smokeless tobacco: Never Used  . Alcohol Use: No    Review of Systems Constitutional: No fever/chills Eyes: No visual changes. ENT: No sore throat. Cardiovascular: Denies chest pain. Respiratory: Denies shortness of breath. Gastrointestinal: No abdominal pain.  No nausea, no vomiting.  No diarrhea.  No constipation. Genitourinary: As above  Musculoskeletal: Negative for back pain. Skin: Negative for rash. Neurological: Negative for headaches, focal weakness or numbness.  10-point ROS otherwise negative.  ____________________________________________   PHYSICAL  EXAM:  VITAL SIGNS: ED Triage Vitals  Enc Vitals Group     BP 03/03/15 1617 215/65 mmHg     Pulse Rate 03/03/15 1617 72     Resp 03/03/15 1617 17     Temp 03/03/15 1617 98.2 F (36.8 C)     Temp Source 03/03/15 1617 Oral     SpO2 03/03/15 1617 95 %     Weight 03/03/15 1617 205 lb (92.987 kg)     Height 03/03/15 1617 5' 5"  (1.651 m)     Head Cir --      Peak Flow --      Pain Score --       Pain Loc --      Pain Edu? --      Excl. in [place]? --     Constitutional: Alert and oriented. Well appearing and in no acute distress. Eyes: Conjunctivae are normal. PERRL. EOMI. Head: Atraumatic. Nose: No congestion/rhinnorhea. Mouth/Throat: Mucous membranes are moist.  Oropharynx non-erythematous. Neck: No stridor.   Cardiovascular: Normal rate, regular rhythm. Grossly normal heart sounds.  Good peripheral circulation. Respiratory: Normal respiratory effort.  No retractions. Lungs CTAB. Gastrointestinal: Soft and nontender. No distention. No abdominal bruits. No CVA tenderness. Musculoskeletal: Bilateral lower extremity edema without any induration or pus.  No joint effusions. Neurologic:  Normal speech and language. No gross focal neurologic deficits are appreciated. No gait instability. Skin:  Skin is warm, dry and intact. No rash noted. Psychiatric: Mood and affect are normal. Speech and behavior are normal.  ____________________________________________   LABS (all labs ordered are listed, but only abnormal results are displayed)  Labs Reviewed  CBC WITH DIFFERENTIAL/PLATELET - Abnormal; Notable for the following:    RBC 2.67 (*)    Hemoglobin 8.5 (*)    HCT 25.7 (*)    Neutro Abs 7.6 (*)    All other components within normal limits  COMPREHENSIVE METABOLIC PANEL - Abnormal; Notable for the following:    Sodium 134 (*)    Glucose, Bld 122 (*)    BUN 47 (*)    Creatinine, Ser 2.55 (*)    Total Protein 5.9 (*)    Albumin 2.9 (*)    AST 14 (*)    ALT 9 (*)    GFR calc non Af Amer 16 (*)    GFR calc Af Amer 18 (*)    All other components within normal limits  URINALYSIS COMPLETEWITH MICROSCOPIC ([facility] ONLY) - Abnormal; Notable for the following:    Color, Urine YELLOW (*)    APPearance CLEAR (*)    Glucose, UA 50 (*)    Protein, ur 100 (*)    Squamous Epithelial / LPF 0-5 (*)    All other components within normal limits  TROPONIN I    ____________________________________________  EKG  ED ECG REPORT I, Doran Stabler, the attending physician, personally viewed and interpreted this ECG.   Date: 03/03/2015  EKG Time: 1707  Rate: 75  Rhythm: normal sinus rhythm  Axis: Normal axis  Intervals:Bifascicular block  ST&T Change: T wave inversions in V2 and V3 which are new from previous.  ____________________________________________  RADIOLOGY  Cardiomegaly and pulmonary vascular congestion. I personally reviewed these images. ____________________________________________   PROCEDURES  ____________________________________________   INITIAL IMPRESSION / ASSESSMENT AND PLAN / ED COURSE  Pertinent labs & imaging results that were available during my care of the patient were reviewed by me and considered in my medical decision making (see chart for details).  Reevaluated  at 8:15 PM Patient still without any pain. Denying any shortness of breath. No chest pain. Able to ambulate with assistance. This appears to be her baseline. She usually embolus with a walker and was observed walking with me assisting by her daughter. Labs are baseline. Negative troponin.  ----------------------------------------- 10:25 PM on 03/03/2015 -----------------------------------------  Ekg with new changes.  Attempted to call patient/family on both listed numbers to discuss this.  No answer. We'll try back in AM.   ____________________________________________   FINAL CLINICAL IMPRESSION(S) / ED DIAGNOSES  Final diagnoses:  Cellulitis of lower extremity, unspecified laterality      Orbie Pyo, MD 03/03/15 2226 Called pt yesterday at 430PM for follow up.  No answer.    Orbie Pyo, MD 03/05/15 [phone]  Reached patient by telephone.  Says feeling improved.  Less weakness as well as redness to legs.  No cp or sob.  Unlikely acute chf or cardiac cause for weakness at time of the emergency department  visit.  Followed up with vascular surgery today.    Orbie Pyo, MD 03/07/15 [phone]

## 2015-03-03 NOTE — ED Notes (Signed)
Patient felt extremely weak beginning this AM when she woke. States she felt fine last night before going to bed.

## 2015-03-13 ENCOUNTER — Telehealth: Payer: Self-pay | Admitting: *Deleted

## 2015-03-15 ENCOUNTER — Observation Stay: Payer: Medicare Other

## 2015-03-15 ENCOUNTER — Emergency Department: Payer: Medicare Other

## 2015-03-15 ENCOUNTER — Observation Stay
Admission: EM | Admit: 2015-03-15 | Discharge: 2015-03-18 | Disposition: A | Discharge status: Home / Self Care | Payer: Medicare Other | Attending: Internal Medicine | Admitting: Internal Medicine

## 2015-03-15 DIAGNOSIS — R918 Other nonspecific abnormal finding of lung field: Secondary | ICD-10-CM | POA: Diagnosis not present

## 2015-03-15 DIAGNOSIS — Z79891 Long term (current) use of opiate analgesic: Secondary | ICD-10-CM | POA: Diagnosis not present

## 2015-03-15 DIAGNOSIS — R262 Difficulty in walking, not elsewhere classified: Secondary | ICD-10-CM | POA: Insufficient documentation

## 2015-03-15 DIAGNOSIS — Z8249 Family history of ischemic heart disease and other diseases of the circulatory system: Secondary | ICD-10-CM | POA: Diagnosis not present

## 2015-03-15 DIAGNOSIS — Z823 Family history of stroke: Secondary | ICD-10-CM | POA: Diagnosis not present

## 2015-03-15 DIAGNOSIS — Z87891 Personal history of nicotine dependence: Secondary | ICD-10-CM | POA: Diagnosis not present

## 2015-03-15 DIAGNOSIS — R6251 Failure to thrive (child): Secondary | ICD-10-CM

## 2015-03-15 DIAGNOSIS — J449 Chronic obstructive pulmonary disease, unspecified: Secondary | ICD-10-CM | POA: Insufficient documentation

## 2015-03-15 DIAGNOSIS — M4856XA Collapsed vertebra, not elsewhere classified, lumbar region, initial encounter for fracture: Secondary | ICD-10-CM | POA: Diagnosis not present

## 2015-03-15 DIAGNOSIS — I509 Heart failure, unspecified: Secondary | ICD-10-CM | POA: Insufficient documentation

## 2015-03-15 DIAGNOSIS — M8588 Other specified disorders of bone density and structure, other site: Secondary | ICD-10-CM | POA: Diagnosis not present

## 2015-03-15 DIAGNOSIS — N183 Chronic kidney disease, stage 3 (moderate): Secondary | ICD-10-CM | POA: Insufficient documentation

## 2015-03-15 DIAGNOSIS — Z791 Long term (current) use of non-steroidal anti-inflammatories (NSAID): Secondary | ICD-10-CM | POA: Insufficient documentation

## 2015-03-15 DIAGNOSIS — K219 Gastro-esophageal reflux disease without esophagitis: Secondary | ICD-10-CM | POA: Insufficient documentation

## 2015-03-15 DIAGNOSIS — Z8673 Personal history of transient ischemic attack (TIA), and cerebral infarction without residual deficits: Secondary | ICD-10-CM | POA: Diagnosis not present

## 2015-03-15 DIAGNOSIS — G8929 Other chronic pain: Secondary | ICD-10-CM | POA: Diagnosis not present

## 2015-03-15 DIAGNOSIS — Z87892 Personal history of anaphylaxis: Secondary | ICD-10-CM | POA: Diagnosis not present

## 2015-03-15 DIAGNOSIS — Z9049 Acquired absence of other specified parts of digestive tract: Secondary | ICD-10-CM | POA: Insufficient documentation

## 2015-03-15 DIAGNOSIS — N390 Urinary tract infection, site not specified: Secondary | ICD-10-CM | POA: Diagnosis present

## 2015-03-15 DIAGNOSIS — G4730 Sleep apnea, unspecified: Secondary | ICD-10-CM | POA: Insufficient documentation

## 2015-03-15 DIAGNOSIS — Z7952 Long term (current) use of systemic steroids: Secondary | ICD-10-CM | POA: Diagnosis not present

## 2015-03-15 DIAGNOSIS — J189 Pneumonia, unspecified organism: Secondary | ICD-10-CM | POA: Diagnosis present

## 2015-03-15 DIAGNOSIS — M6281 Muscle weakness (generalized): Secondary | ICD-10-CM | POA: Insufficient documentation

## 2015-03-15 DIAGNOSIS — Z9889 Other specified postprocedural states: Secondary | ICD-10-CM | POA: Insufficient documentation

## 2015-03-15 DIAGNOSIS — R531 Weakness: Secondary | ICD-10-CM | POA: Diagnosis not present

## 2015-03-15 DIAGNOSIS — M5134 Other intervertebral disc degeneration, thoracic region: Secondary | ICD-10-CM | POA: Insufficient documentation

## 2015-03-15 DIAGNOSIS — I129 Hypertensive chronic kidney disease with stage 1 through stage 4 chronic kidney disease, or unspecified chronic kidney disease: Secondary | ICD-10-CM | POA: Diagnosis not present

## 2015-03-15 DIAGNOSIS — Z888 Allergy status to other drugs, medicaments and biological substances status: Secondary | ICD-10-CM | POA: Insufficient documentation

## 2015-03-15 DIAGNOSIS — Z885 Allergy status to narcotic agent status: Secondary | ICD-10-CM | POA: Insufficient documentation

## 2015-03-15 DIAGNOSIS — G40909 Epilepsy, unspecified, not intractable, without status epilepticus: Secondary | ICD-10-CM | POA: Insufficient documentation

## 2015-03-15 DIAGNOSIS — J90 Pleural effusion, not elsewhere classified: Secondary | ICD-10-CM | POA: Diagnosis not present

## 2015-03-15 DIAGNOSIS — X58XXXA Exposure to other specified factors, initial encounter: Secondary | ICD-10-CM | POA: Diagnosis not present

## 2015-03-15 DIAGNOSIS — J019 Acute sinusitis, unspecified: Secondary | ICD-10-CM | POA: Diagnosis not present

## 2015-03-15 DIAGNOSIS — R5383 Other fatigue: Secondary | ICD-10-CM | POA: Insufficient documentation

## 2015-03-15 DIAGNOSIS — J9610 Chronic respiratory failure, unspecified whether with hypoxia or hypercapnia: Secondary | ICD-10-CM | POA: Insufficient documentation

## 2015-03-15 DIAGNOSIS — M549 Dorsalgia, unspecified: Secondary | ICD-10-CM | POA: Insufficient documentation

## 2015-03-15 DIAGNOSIS — E860 Dehydration: Secondary | ICD-10-CM | POA: Insufficient documentation

## 2015-03-15 DIAGNOSIS — R627 Adult failure to thrive: Secondary | ICD-10-CM | POA: Diagnosis present

## 2015-03-15 DIAGNOSIS — Z807 Family history of other malignant neoplasms of lymphoid, hematopoietic and related tissues: Secondary | ICD-10-CM | POA: Diagnosis not present

## 2015-03-15 DIAGNOSIS — R29898 Other symptoms and signs involving the musculoskeletal system: Secondary | ICD-10-CM

## 2015-03-15 DIAGNOSIS — D649 Anemia, unspecified: Secondary | ICD-10-CM | POA: Diagnosis not present

## 2015-03-15 LAB — URINALYSIS COMPLETE WITH MICROSCOPIC (ARMC ONLY)
Bilirubin Urine: NEGATIVE
GLUCOSE, UA: 50 mg/dL — AB
Hgb urine dipstick: NEGATIVE
Ketones, ur: NEGATIVE mg/dL
Leukocytes, UA: NEGATIVE
Nitrite: NEGATIVE
Protein, ur: 100 mg/dL — AB
SPECIFIC GRAVITY, URINE: 1.014 (ref 1.005–1.030)
Squamous Epithelial / LPF: NONE SEEN
pH: 5 (ref 5.0–8.0)

## 2015-03-15 LAB — COMPREHENSIVE METABOLIC PANEL
ALBUMIN: 2.8 g/dL — AB (ref 3.5–5.0)
ALT: 11 U/L — ABNORMAL LOW (ref 14–54)
AST: 17 U/L (ref 15–41)
Alkaline Phosphatase: 44 U/L (ref 38–126)
Anion gap: 11 (ref 5–15)
BUN: 46 mg/dL — AB (ref 6–20)
CHLORIDE: 101 mmol/L (ref 101–111)
CO2: 25 mmol/L (ref 22–32)
Calcium: 9.3 mg/dL (ref 8.9–10.3)
Creatinine, Ser: 2.66 mg/dL — ABNORMAL HIGH (ref 0.44–1.00)
GFR calc non Af Amer: 15 mL/min — ABNORMAL LOW (ref >60)
GFR, EST AFRICAN AMERICAN: 17 mL/min — AB (ref >60)
Glucose, Bld: 92 mg/dL (ref 65–99)
Potassium: 3.6 mmol/L (ref 3.5–5.1)
Sodium: 137 mmol/L (ref 135–145)
TOTAL PROTEIN: 5.8 g/dL — AB (ref 6.5–8.1)
Total Bilirubin: 0.6 mg/dL (ref 0.3–1.2)

## 2015-03-15 LAB — CBC
HEMATOCRIT: 26.7 % — AB (ref 35.0–47.0)
HEMOGLOBIN: 8.7 g/dL — AB (ref 12.0–16.0)
MCH: 32 pg (ref 26.0–34.0)
MCHC: 32.8 g/dL (ref 32.0–36.0)
MCV: 97.6 fL (ref 80.0–100.0)
PLATELETS: 262 10*3/uL (ref 150–440)
RBC: 2.73 MIL/uL — AB (ref 3.80–5.20)
RDW: 14 % (ref 11.5–14.5)
WBC: 13.5 10*3/uL — AB (ref 3.6–11.0)

## 2015-03-15 LAB — TROPONIN I: Troponin I: 0.03 ng/mL (ref <0.031)

## 2015-03-15 MED ORDER — ACETAMINOPHEN 325 MG PO TABS
975.0000 mg | ORAL_TABLET | Freq: Once | ORAL | Status: AC
  Administered 2015-03-15: 975 mg via ORAL

## 2015-03-15 MED ORDER — CLONIDINE HCL 0.1 MG PO TABS
0.3000 mg | ORAL_TABLET | Freq: Two times a day (BID) | ORAL | Status: DC
  Administered 2015-03-16 – 2015-03-17 (×4): 0.3 mg via ORAL
  Filled 2015-03-15 (×4): qty 3

## 2015-03-15 MED ORDER — PANTOPRAZOLE SODIUM 40 MG PO TBEC
40.0000 mg | DELAYED_RELEASE_TABLET | Freq: Two times a day (BID) | ORAL | Status: DC
  Administered 2015-03-16 – 2015-03-18 (×6): 40 mg via ORAL
  Filled 2015-03-15 (×6): qty 1

## 2015-03-15 MED ORDER — FENTANYL 50 MCG/HR TD PT72: 50.0000 ug | MEDICATED_PATCH | TRANSDERMAL | Status: DC

## 2015-03-15 MED ORDER — METAXALONE 800 MG PO TABS: 800.0000 mg | ORAL_TABLET | Freq: Three times a day (TID) | ORAL | Status: DC | PRN

## 2015-03-15 MED ORDER — NEPHRO-VITE 0.8 MG PO TABS
1.0000 | ORAL_TABLET | Freq: Every day | ORAL | Status: DC
  Administered 2015-03-16 – 2015-03-18 (×3): 1 via ORAL
  Filled 2015-03-15 (×5): qty 1

## 2015-03-15 MED ORDER — PHENYTOIN SODIUM EXTENDED 100 MG PO CAPS
200.0000 mg | ORAL_CAPSULE | Freq: Every day | ORAL | Status: DC
  Administered 2015-03-16 – 2015-03-17 (×3): 200 mg via ORAL
  Filled 2015-03-15 (×3): qty 2

## 2015-03-15 MED ORDER — ACETAMINOPHEN 325 MG PO TABS
650.0000 mg | ORAL_TABLET | Freq: Once | ORAL | Status: DC
Start: 2015-03-15 — End: 2015-03-15
  Filled 2015-03-15: qty 3

## 2015-03-15 MED ORDER — ENSURE ENLIVE PO LIQD
237.0000 mL | Freq: Two times a day (BID) | ORAL | Status: DC
  Administered 2015-03-16 – 2015-03-18 (×5): 237 mL via ORAL

## 2015-03-15 MED ORDER — HYDROCHLOROTHIAZIDE 25 MG PO TABS
25.0000 mg | ORAL_TABLET | Freq: Every day | ORAL | Status: DC
  Administered 2015-03-16 – 2015-03-18 (×3): 25 mg via ORAL
  Filled 2015-03-15 (×3): qty 1

## 2015-03-15 MED ORDER — GABAPENTIN 100 MG PO CAPS
100.0000 mg | ORAL_CAPSULE | Freq: Three times a day (TID) | ORAL | Status: DC
  Administered 2015-03-16 – 2015-03-18 (×9): 100 mg via ORAL
  Filled 2015-03-15 (×9): qty 1

## 2015-03-15 MED ORDER — HEPARIN SODIUM (PORCINE) 5000 UNIT/ML IJ SOLN
5000.0000 [IU] | Freq: Three times a day (TID) | INTRAMUSCULAR | Status: DC
  Administered 2015-03-16 – 2015-03-18 (×7): 5000 [IU] via SUBCUTANEOUS
  Filled 2015-03-15 (×7): qty 1

## 2015-03-15 MED ORDER — HYDRALAZINE HCL 50 MG PO TABS
50.0000 mg | ORAL_TABLET | Freq: Three times a day (TID) | ORAL | Status: DC
  Administered 2015-03-16: 50 mg via ORAL
  Filled 2015-03-15 (×2): qty 1

## 2015-03-15 MED ORDER — PREDNISONE 10 MG PO TABS
10.0000 mg | ORAL_TABLET | Freq: Every day | ORAL | Status: DC
  Administered 2015-03-16 – 2015-03-18 (×3): 10 mg via ORAL
  Filled 2015-03-15 (×3): qty 1

## 2015-03-15 MED ORDER — DILTIAZEM HCL ER COATED BEADS 120 MG PO CP24
240.0000 mg | ORAL_CAPSULE | Freq: Every day | ORAL | Status: DC
  Administered 2015-03-16 – 2015-03-18 (×3): 240 mg via ORAL
  Filled 2015-03-15 (×3): qty 2

## 2015-03-15 MED ORDER — FENTANYL 12 MCG/HR TD PT72: 12.5000 ug | MEDICATED_PATCH | TRANSDERMAL | Status: DC

## 2015-03-15 MED ORDER — CARVEDILOL 6.25 MG PO TABS
6.2500 mg | ORAL_TABLET | Freq: Two times a day (BID) | ORAL | Status: DC
  Administered 2015-03-16 – 2015-03-18 (×6): 6.25 mg via ORAL
  Filled 2015-03-15 (×6): qty 1

## 2015-03-15 NOTE — ED Notes (Signed)
MD at bedside. 

## 2015-03-15 NOTE — ED Notes (Signed)
Pt report generalized weakness x "several" days.Pt was ambulating to the BR and had to lower herself to the ground. Pt denies falling.

## 2015-03-15 NOTE — ED Provider Notes (Signed)
03/15/2015  4:15 PM I discussed this patient with Dr.Vachani the hospitalist secondary to patient did not qualify to leave the ED directly to a skilled nursing facility. At this point patient has had a significant decline in her capabilities over the past 24-48 hours and now is unable to walk or care for herself. Patient will be admitted for further workup and treatment.  Leona Carry, MD 03/15/15 [phone]

## 2015-03-15 NOTE — ED Provider Notes (Signed)
[facility] Emergency Department Provider Note  ____________________________________________  Time seen: On arrival, via EMS  I have reviewed the triage vital signs and the nursing notes.   HISTORY  Chief Complaint Fatigue    HPI Kristina Wilson is a 81 y.o. female who presents via EMS with complaints of weakness. She reports she was ambulating to the bathroom with help of her daughter when she weak all over and she felt like her legs wouldn't support her so she sank to the ground with the help of her daughter. No injury. She denies focal weakness. She reports she just feels weak. She has had a mild cough. She has a history of anemia for which she sees Dr. Ma Hillock. She denies fevers chills.    Past Medical History  Diagnosis Date  . Muscle weakness   . Chronic respiratory failure   . Pneumonia   . Sleep apnea   . CHF (congestive heart failure)   . Chronic kidney disease   . Dysphagia   . GERD (gastroesophageal reflux disease)   . Anemia   . Back pain   . Epilepsy   . TIA (transient ischemic attack)     Patient Active Problem List   Diagnosis Date Noted  . GI bleed 12/27/2014  . Hypotension 12/27/2014  . Anemia of chronic disease 12/27/2014  . Acute posthemorrhagic anemia 12/27/2014  . Acute renal failure superimposed on stage 4 chronic kidney disease 12/27/2014  . Cellulitis 12/27/2014    Past Surgical History  Procedure Laterality Date  . Back surgery    . Knee surgery    . Carotid endarterectomy    . Tonsillectomy    . Appendectomy      Current Outpatient Rx  Name  Route  Sig  Dispense  Refill  . acetaminophen (TYLENOL) 325 MG tablet   Oral   Take 650 mg by mouth every 4 (four) hours as needed for mild pain or fever.         Marland Kitchen acetaminophen (TYLENOL) 500 MG tablet   Oral   Take 1,000 mg by mouth 3 (three) times daily.         Marland Kitchen aspirin EC 81 MG tablet   Oral   Take 81 mg by mouth daily.         Marland Kitchen b complex-vitamin  c-folic acid (NEPHRO-VITE) 0.8 MG TABS tablet   Oral   Take 1 tablet by mouth daily.         . carvedilol (COREG) 6.25 MG tablet   Oral   Take 6.25 mg by mouth 2 (two) times daily.         . cetirizine (ZYRTEC) 10 MG tablet   Oral   Take 10 mg by mouth daily.         . Coenzyme Q-10 100 MG capsule   Oral   Take 300 mg by mouth daily.         . diclofenac sodium (VOLTAREN) 1 % GEL   Topical   Apply 2 g topically 4 (four) times daily. Apply to posterior neck and right shoulder until pain is relieved.         . diltiazem (CARDIZEM CD) 240 MG 24 hr capsule   Oral   Take 240 mg by mouth 2 (two) times daily.         Marland Kitchen docusate sodium (COLACE) 100 MG capsule   Oral   Take 100 mg by mouth every other day. At bedtime         .  doxycycline (VIBRA-TABS) 100 MG tablet   Oral   Take 1 tablet (100 mg total) by mouth 2 (two) times daily.   20 tablet   0   . fentaNYL (DURAGESIC - DOSED MCG/HR) 50 MCG/HR   Transdermal   Place 50 mcg onto the skin every 3 (three) days.         . fentaNYL (DURAGESIC - DOSED MCG/HR) 50 MCG/HR   Transdermal   Place 1 patch (50 mcg total) onto the skin every 3 (three) days. Patient not taking: Reported on 01/09/2015   5 patch   0   . ferrous sulfate 325 (65 FE) MG tablet   Oral   Take 325 mg by mouth 3 (three) times daily.         . furosemide (LASIX) 40 MG tablet   Oral   Take 40 mg by mouth daily.         Marland Kitchen gabapentin (NEURONTIN) 100 MG capsule   Oral   Take 100 mg by mouth 3 (three) times daily.         . hydrALAZINE (APRESOLINE) 50 MG tablet   Oral   Take 50 mg by mouth 3 (three) times daily.         Marland Kitchen lidocaine (LIDODERM) 5 %   Transdermal   Place 1 patch onto the skin daily. Remove & Discard patch within 12 hours or as directed by MD         . metaxalone (SKELAXIN) 800 MG tablet   Oral   Take 400 mg by mouth every 8 (eight) hours.         . Multiple Vitamins-Minerals (PRESERVISION AREDS) CAPS   Oral    Take 1 capsule by mouth 2 (two) times daily.          . OxyCODONE HCl, Abuse Deter, (OXAYDO) 5 MG TABA   Oral   Take 5 mg by mouth every 3 (three) hours as needed.         . pantoprazole (PROTONIX) 40 MG tablet   Oral   Take 40 mg by mouth 2 (two) times daily.         . phenytoin (DILANTIN) 100 MG ER capsule   Oral   Take 300 mg by mouth at bedtime.         . polyvinyl alcohol-povidone (HYPOTEARS) 1.4-0.6 % ophthalmic solution   Both Eyes   Place 2 drops into both eyes 4 (four) times daily as needed (dry, irritated eyes).         . predniSONE (DELTASONE) 10 MG tablet   Oral   Take 10 mg by mouth daily.         . vitamin B-12 (CYANOCOBALAMIN) 1000 MCG tablet   Oral   Take 1,000 mcg by mouth daily.           Allergies Capsaicin; Codeine; Enalapril; Hydrocodone; Oxycodone; Tramadol; and Vicodin  Family History  Problem Relation Age of Onset  . CAD Mother   . CAD Father   . Anemia Father   . CVA Sister   . Multiple myeloma Brother     Social History Social History  Substance Use Topics  . Smoking status: Former Smoker -- 2.00 packs/day for 20 years    Types: Cigarettes  . Smokeless tobacco: Never Used  . Alcohol Use: No    Review of Systems  Constitutional: Negative for fever. Eyes: Negative for visual changes. ENT: Negative for sore throat Cardiovascular: Negative for chest pain. Respiratory: Negative for shortness  of breath. Positive for cough Gastrointestinal: Negative for abdominal pain, vomiting and diarrhea. Genitourinary: Negative for dysuria. Musculoskeletal: Negative for back pain. Skin: Negative for rash. Neurological: Negative for headaches or focal weakness Psychiatric: Mild anxiety    ____________________________________________   PHYSICAL EXAM:  VITAL SIGNS: ED Triage Vitals  Enc Vitals Group     BP 03/15/15 0926 192/85 mmHg     Pulse --      Resp 03/15/15 0926 18     Temp 03/15/15 0926 98.5 F (36.9 C)     Temp  Source 03/15/15 0926 Oral     SpO2 03/15/15 0926 97 %     Weight --      Height --      Head Cir --      Peak Flow --      Pain Score --      Pain Loc --      Pain Edu? --      Excl. in [place]? --      Constitutional: Alert and oriented. Well appearing and in no distress. Pleasant and interactive Eyes: Conjunctivae are normal. EOMI, PERRLA ENT   Head: Normocephalic and atraumatic.   Mouth/Throat: Mucous membranes are moist. Cardiovascular: Normal rate, regular rhythm. Normal and symmetric distal pulses are present in all extremities. No murmurs, rubs, or gallops. Respiratory: Normal respiratory effort without tachypnea nor retractions. Breath sounds are clear and equal bilaterally.  Gastrointestinal: Soft and non-tender in all quadrants. No distention. There is no CVA tenderness. Genitourinary: deferred Musculoskeletal: Nontender with normal range of motion in all extremities. No lower extremity tenderness nor edema. Neurologic:  Normal speech and language. No gross focal neurologic deficits are appreciated. Patient is able to move both lower extremities equally. Cranial nerves II through XII are intact Skin:  Skin is warm, dry and intact. No rash noted. Psychiatric: Mood and affect are normal. Patient exhibits appropriate insight and judgment.  ____________________________________________    LABS (pertinent positives/negatives)  Labs Reviewed - No data to display  ____________________________________________   EKG  ED ECG REPORT I, Lavonia Drafts, the attending physician, personally viewed and interpreted this ECG.  Date: 03/15/2015 EKG Time: 9:53 AM Rate: 80 Rhythm: normal sinus rhythm QRS Axis: normal Intervals: normal ST/T Wave abnormalities: normal Conduction Disutrbances: none Narrative Interpretation: unremarkable   ____________________________________________    RADIOLOGY I have personally reviewed any xrays that were ordered on this patient: Chest  x-ray with possible basilar infiltrate CT scan of the chest unremarkable  ____________________________________________   PROCEDURES  Procedure(s) performed: none  Critical Care performed: none  ____________________________________________   INITIAL IMPRESSION / ASSESSMENT AND PLAN / ED COURSE  Pertinent labs & imaging results that were available during my care of the patient were reviewed by me and considered in my medical decision making (see chart for details).  Unclear cause of patient's weakness. We will check labs, chest x-ray, urine and reevaluate ----------------------------------------- 1:14 PM on 03/15/2015 -----------------------------------------  Workup was relatively unremarkable. Patient does have a slightly elevated white blood cell count although CT chest shows no abnormalities. Her hemoglobin is 8.7 she does have a history of normocytic anemia for which she sees Dr. Ma Hillock. Her renal function is similar to prior. We will attempt to ambulate the patient with a walker to see if she may be able to go home which is her preference ____________________________________________   ----------------------------------------- 2:36 PM on 03/15/2015 -----------------------------------------  Labs unremarkable. Patient is unable to ambulate on her own. I will ask social work to see  the patient given that this appears to be a gradually worsening problem over the last 2-3 weeks per daughter  Signed out to Dr. Lovena Le at [phone]   FINAL CLINICAL IMPRESSION(S) / ED DIAGNOSES  Final diagnoses:  Generalized weakness     Lavonia Drafts, MD 03/15/15 1546

## 2015-03-15 NOTE — Clinical Social Work Note (Signed)
Clinical Social Work Assessment  Patient Details  Name: Kristina Wilson MRN: [id number] Date of Birth: 10-31-1922  Date of referral:  03/15/15               Reason for consult:  Facility Placement                Permission sought to share information with:  Facility Medical sales representative, Family Supports Permission granted to share information::  Yes, Verbal Permission Granted  Name::      (Daughter Tresa Endo  [phone] cell or [phone] hm)  Agency::   (SNF)  Relationship::     Contact Information:     Housing/Transportation Living arrangements for the past 2 months:  Single Family Home Source of Information:  Patient, Adult Children Patient Interpreter Needed:  None Criminal Activity/Legal Involvement Pertinent to Current Situation/Hospitalization:  No - Comment as needed Significant Relationships:  Adult Children Lives with:  Adult Children Do you feel safe going back to the place where you live?  No (patient afraid she will fall.) Need for family participation in patient care:  Yes (Comment)  Care giving concerns:  Patient and daughter are concerned with patient not being able to walk.   Social Worker assessment / plan:  Patient reported to ED after a continued decline and inability to walk.  ED-CSW in to assess patient.  She is pleasant and engaged in conversation.  Daughter Tresa Endo at bedside providing most of the information.   Patient currently lives with her daughter.  She has been taking care of her mother for the past three years. Patient is receiving home health OT, PT, RN and CNA.   Patient was using walker, walking to the bathroom but is not unable to stand.   Daughter states patient needs rehab as she is now unable to stand.   Per daughter she would like patient to go to rehab. Patient was at [facility] in March of this year.   Medical Floor CSW will follow patient for ongoing and discharge needs.  Employment status:    Product/process development scientist PT Recommendations:  Not assessed at this time Information / Referral to community resources:     Patient/Family's Response to care:  Patient and daughter are in agreement with patient being admitted so they have an understanding of why patient is unable to walk.  Patient/Family's Understanding of and Emotional Response to Diagnosis, Current Treatment, and Prognosis:  Patient and daughter understand patient is being admitted for more test.  Emotional Assessment Appearance:  Appears younger than stated age Attitude/Demeanor/Rapport:    Affect (typically observed):  Accepting, Appropriate, Calm, Pleasant Orientation:  Oriented to Self, Oriented to Place, Oriented to  Time, Oriented to Situation Alcohol / Substance use:  Not Applicable Psych involvement (Current and /or in the community):  No, per patient.   Discharge Needs  Concerns to be addressed:    Readmission within the last 30 days:  No Current discharge risk:  Dependent with Mobility, Physical Impairment Barriers to Discharge:  Other (Patient unable to discharge home as she is daughter is unable to care for her.)   Christian Mate 03/15/2015, 6:09 PM Sammuel Hines. Theresia Majors, MSW Clinical Social Work Department Emergency Room [phone] 6:13 PM

## 2015-03-15 NOTE — H&P (Signed)
[facility] NAME: Kristina Wilson    MR#:  [id number]  DATE OF BIRTH:  22-May-1923  DATE OF ADMISSION:  03/15/2015  PRIMARY CARE PHYSICIAN: Albina Billet, MD   REQUESTING/REFERRING PHYSICIAN: Marta Antu  CHIEF COMPLAINT:   Chief Complaint  Patient presents with  . Fatigue    HISTORY OF PRESENT ILLNESS: Kristina Wilson  is a 81 y.o. female with a known history of COPD, sleep apnea, CHF, chronic kidney disease, gastroesophageal reflux disease, chronic anemia, chronic back pain, episode of TIA in the past- has been gradually getting weak for last 1 month, she lives with daughter, and as per her she was able to walk with a walker in the house to go to the bathroom and move around. She has been having this same functional status for last 1-1/2 month, but for the last 2 days daughter feels something is wrong she could not pinpoint what exactly. Today when patient tried to go to the bathroom with her walker after walking a few steps she felt extremely weak, had severe pain in her back, and she almost bend over and could not support herself, the daughter tried to hold her but finally she had to slowly let her see down on the floor. She did not fall, and daughter had to support her to go back to the chair.  Concerned with this she brought her to the emergency room.  In emergency room she was noted to have negative CT scan of the head and CT scan of the chest and urinalysis, ER physician tried to work with Education officer, museum to arrange for her rehabilitation placement, but could not do that as her insurance requires her to be admitted to the hospital. So ER physician requested admission for functional decline.  On further questioning I got history from daughter patient was treated for her cellulitis on both legs in last 10 days- with doxycycline her last dose was yesterday. She also has been following up with Dr. Holley Raring in the office for her  chronic renal failure. She has some vague complain of abdominal pain in central area, without any nausea vomiting or diarrhea.  PAST MEDICAL HISTORY:   Past Medical History  Diagnosis Date  . Muscle weakness   . Chronic respiratory failure   . Pneumonia   . Sleep apnea   . CHF (congestive heart failure)   . Chronic kidney disease   . Dysphagia   . GERD (gastroesophageal reflux disease)   . Anemia   . Back pain   . Epilepsy   . TIA (transient ischemic attack)     PAST SURGICAL HISTORY:  Past Surgical History  Procedure Laterality Date  . Back surgery    . Knee surgery    . Carotid endarterectomy    . Tonsillectomy    . Appendectomy      SOCIAL HISTORY:  Social History  Substance Use Topics  . Smoking status: Former Smoker -- 2.00 packs/day for 20 years    Types: Cigarettes  . Smokeless tobacco: Never Used  . Alcohol Use: No    FAMILY HISTORY:  Family History  Problem Relation Age of Onset  . CAD Mother   . CAD Father   . Anemia Father   . CVA Sister   . Multiple myeloma Brother     DRUG ALLERGIES:  Allergies  Allergen Reactions  . Enalapril Anaphylaxis  . Tramadol Shortness Of Breath  . Capsaicin Other (See Comments)  Reaction:  Unknown   . Codeine Other (See Comments)    Reaction:  Unknown   . Hydrocodone Other (See Comments)    Reaction:  Unknown   . Oxycodone Other (See Comments)    Reaction:  Unknown   . Vicodin [Hydrocodone-Acetaminophen] Other (See Comments)    Reaction:  Unknown     REVIEW OF SYSTEMS:   CONSTITUTIONAL: No fever, positive for fatigue or weakness.  EYES: No blurred or double vision.  EARS, NOSE, AND THROAT: No tinnitus or ear pain.  RESPIRATORY: No cough, shortness of breath, wheezing or hemoptysis.  CARDIOVASCULAR: No chest pain, orthopnea, edema.  GASTROINTESTINAL: No nausea, vomiting, diarrhea or abdominal pain.  GENITOURINARY: No dysuria, hematuria.  ENDOCRINE: No polyuria, nocturia,  HEMATOLOGY: No anemia, easy  bruising or bleeding SKIN: No rash or lesion. MUSCULOSKELETAL: No joint pain or arthritis.   NEUROLOGIC: No tingling, numbness,  Severe weakness both lower limbhs and mild weakness in both upper limbs.Marland Kitchen  PSYCHIATRY: No anxiety or depression.   MEDICATIONS AT HOME:  Prior to Admission medications   Medication Sig Start Date End Date Taking? Authorizing Provider  acetaminophen (TYLENOL) 325 MG tablet Take 650 mg by mouth every 4 (four) hours as needed for mild pain, fever or headache.    Yes Historical Provider, MD  carvedilol (COREG) 6.25 MG tablet Take 6.25 mg by mouth 2 (two) times daily.   Yes Historical Provider, MD  cloNIDine (CATAPRES) 0.3 MG tablet Take 0.3 mg by mouth 2 (two) times daily.   Yes Historical Provider, MD  diltiazem (CARDIZEM CD) 240 MG 24 hr capsule Take 240 mg by mouth daily.    Yes Historical Provider, MD  fentaNYL (DURAGESIC - DOSED MCG/HR) 12 MCG/HR Place 12.5 mcg onto the skin every 3 (three) days.   Yes Historical Provider, MD  furosemide (LASIX) 40 MG tablet Take 40 mg by mouth daily.   Yes Historical Provider, MD  gabapentin (NEURONTIN) 100 MG capsule Take 100 mg by mouth 3 (three) times daily.   Yes Historical Provider, MD  hydrALAZINE (APRESOLINE) 50 MG tablet Take 50 mg by mouth 3 (three) times daily.   Yes Historical Provider, MD  hydrochlorothiazide (HYDRODIURIL) 25 MG tablet Take 25 mg by mouth daily.   Yes Historical Provider, MD  metaxalone (SKELAXIN) 800 MG tablet Take 800 mg by mouth every 8 (eight) hours as needed for muscle spasms.    Yes Historical Provider, MD  multivitamin (RENA-VIT) TABS tablet Take 1 tablet by mouth daily.   Yes Historical Provider, MD  naproxen (NAPROSYN) 500 MG tablet Take 500 mg by mouth 2 (two) times daily.   Yes Historical Provider, MD  pantoprazole (PROTONIX) 40 MG tablet Take 40 mg by mouth 2 (two) times daily.   Yes Historical Provider, MD  phenytoin (DILANTIN) 100 MG ER capsule Take 200 mg by mouth at bedtime.    Yes  Historical Provider, MD  predniSONE (DELTASONE) 10 MG tablet Take 10 mg by mouth daily.   Yes Historical Provider, MD  doxycycline (VIBRA-TABS) 100 MG tablet Take 1 tablet (100 mg total) by mouth 2 (two) times daily. Patient not taking: Reported on 03/15/2015 03/03/15   Orbie Pyo, MD  fentaNYL (DURAGESIC - DOSED MCG/HR) 50 MCG/HR Place 1 patch (50 mcg total) onto the skin every 3 (three) days. Patient not taking: Reported on 01/09/2015 12/28/14   Henreitta Leber, MD      PHYSICAL EXAMINATION:   VITAL SIGNS: Blood pressure 165/55, pulse 68, temperature 98.5 F (36.9 C),  temperature source Oral, resp. rate 16, SpO2 94 %.  GENERAL:  81 y.o.-year-old patient lying in the bed with no acute distress.  EYES: Pupils equal, round, reactive to light and accommodation. No scleral icterus. Extraocular muscles intact.  HEENT: Head atraumatic, normocephalic. Oropharynx and nasopharynx clear.  NECK:  Supple, no jugular venous distention. No thyroid enlargement, no tenderness.  LUNGS: Normal breath sounds bilaterally, no wheezing, rales,rhonchi or crepitation. No use of accessory muscles of respiration.  CARDIOVASCULAR: S1, S2 normal. No murmurs, rubs, or gallops.  ABDOMEN: Soft, nontender, nondistended. Bowel sounds present. No organomegaly or mass.  EXTREMITIES: No pedal edema, cyanosis, or clubbing.  NEUROLOGIC: Cranial nerves II through XII are intact. Muscle strength 2-3/5 in both lower extremities and 4/5 in both upper extrimities. Sensation intact. Gait not checked.  PSYCHIATRIC: The patient is alert and oriented x 3.  SKIN: No obvious rash, lesion, or ulcer.   LABORATORY PANEL:   CBC  Recent Labs Lab 03/15/15 0944  WBC 13.5*  HGB 8.7*  HCT 26.7*  PLT 262  MCV 97.6  MCH 32.0  MCHC 32.8  RDW 14.0   ------------------------------------------------------------------------------------------------------------------  Chemistries   Recent Labs Lab 03/15/15 0944  NA 137  K  3.6  CL 101  CO2 25  GLUCOSE 92  BUN 46*  CREATININE 2.66*  CALCIUM 9.3  AST 17  ALT 11*  ALKPHOS 44  BILITOT 0.6   ------------------------------------------------------------------------------------------------------------------ estimated creatinine clearance is 15.8 mL/min (by C-G formula based on Cr of 2.66). ------------------------------------------------------------------------------------------------------------------ No results for input(s): TSH, T4TOTAL, T3FREE, THYROIDAB in the last 72 hours.  Invalid input(s): FREET3   Coagulation profile No results for input(s): INR, PROTIME in the last 168 hours. ------------------------------------------------------------------------------------------------------------------- No results for input(s): DDIMER in the last 72 hours. -------------------------------------------------------------------------------------------------------------------  Cardiac Enzymes  Recent Labs Lab 03/15/15 0944  TROPONINI 0.03   ------------------------------------------------------------------------------------------------------------------ Invalid input(s): POCBNP  ---------------------------------------------------------------------------------------------------------------  Urinalysis    Component Value Date/Time   COLORURINE YELLOW* 03/15/2015 1752   COLORURINE Yellow 11/04/2014 1547   APPEARANCEUR HAZY* 03/15/2015 1752   APPEARANCEUR Cloudy 11/04/2014 1547   LABSPEC 1.014 03/15/2015 1752   LABSPEC 1.014 11/04/2014 1547   PHURINE 5.0 03/15/2015 1752   PHURINE 5.0 11/04/2014 1547   GLUCOSEU 50* 03/15/2015 1752   GLUCOSEU 50 mg/dL 11/04/2014 1547   HGBUR NEGATIVE 03/15/2015 1752   HGBUR Negative 11/04/2014 1547   BILIRUBINUR NEGATIVE 03/15/2015 1752   BILIRUBINUR Negative 11/04/2014 1547   KETONESUR NEGATIVE 03/15/2015 1752   KETONESUR Negative 11/04/2014 1547   PROTEINUR 100* 03/15/2015 1752   PROTEINUR >=500 11/04/2014 1547    NITRITE NEGATIVE 03/15/2015 1752   NITRITE Negative 11/04/2014 1547   LEUKOCYTESUR NEGATIVE 03/15/2015 1752   LEUKOCYTESUR Negative 11/04/2014 1547     RADIOLOGY: Ct Head Wo Contrast  03/15/2015   CLINICAL DATA:  PICC line and functional capabilities over past 24-48 hours, now unable to walk or care for self, weakness, history CHF, GERD, TIA, former smoker  EXAM: CT HEAD WITHOUT CONTRAST  TECHNIQUE: Contiguous axial images were obtained from the base of the skull through the vertex without intravenous contrast.  COMPARISON:  12/07/2014  FINDINGS: Age-related atrophy.  Normal ventricular morphology.  No midline shift or mass effect.  Few scattered artifacts from skull.  No definite intracranial hemorrhage, mass lesion, or evidence acute infarction.  No extra-axial fluid collections.  Bones demineralized.  Atherosclerotic calcifications at the carotid siphons.  Minimal mucosal thickening in the maxillary sinuses.  IMPRESSION: Age-related atrophy.  No acute intracranial abnormalities.   Electronically  Signed   By: Lavonia Dana M.D.   On: 03/15/2015 17:04   Ct Chest Wo Contrast  03/15/2015   CLINICAL DATA:  Generalized muscle weakness for several days, abnormal chest radiograph with persistent LEFT lower lobe process, former smoker,  EXAM: CT CHEST WITHOUT CONTRAST  TECHNIQUE: Multidetector CT imaging of the chest was performed following the standard protocol without IV contrast. Sagittal and coronal MPR images reconstructed from axial data set.  COMPARISON:  10/07/2014  FINDINGS: Scattered atherosclerotic calcifications aorta, proximal great vessels and coronary arteries.  Mitral annular calcification noted.  Enlarged central pulmonary arteries.  Visualized upper abdomen grossly unremarkable.  No thoracic adenopathy.  Bones diffusely demineralized with multilevel degenerative disc disease changes of the thoracic spine and chronic compression deformity of a mid thoracic vertebra.  Slightly increased moderate  LEFT pleural effusion.  Scattered respiratory motion artifacts.  Significant atelectasis LEFT lower lobe with dependent atelectasis at posterior aspect of LEFT upper lobe as well.  Scarring at medial aspect RIGHT lower lobe.  No pulmonary infiltrate, pneumothorax, or definite mass/nodule.  IMPRESSION: Slightly increased LEFT pleural effusion and lower lobe atelectasis.  Scattered atherosclerotic disease changes.  Enlarged central pulmonary arteries question pulmonary arterial hypertension.  No additional intra thoracic abnormalities identified.   Electronically Signed   By: Lavonia Dana M.D.   On: 03/15/2015 11:39   Dg Chest Portable 1 View  03/15/2015   CLINICAL DATA:  2 hr history of weakness.  EXAM: PORTABLE CHEST - 1 VIEW  COMPARISON:  03/03/2015  FINDINGS: The heart is mildly enlarged but stable. There is tortuosity and calcification of the thoracic aorta. Persistent left lower lobe process appears to be a combination of effusion, atelectasis and or infiltrate. The bony thorax is intact.  IMPRESSION: Stable cardiac enlargement and persist left lower lobe process.   Electronically Signed   By: Marijo Sanes M.D.   On: 03/15/2015 10:24    IMPRESSION AND PLAN:  * Generalized weakness mostly in both lower extremity  Her workup for any infection is negative so far, she does not have fever or elevated white cell count either.  She is complaining of pain in her back and then she felt weak, so I will get x-ray thoracic and lumbar spine, and if it shows some evidence of fracture or dislocation then It can explain lower extremity weakness.  We will get physical therapy evaluation, and most likely she will need a rehabilitation placement.  If we continue not to have any answer about her weakness and we might have to get help from the neurologist.  * Uncontrolled hypertension  Her blood pressure is fluctuating, I will continue her home medications for now.  * COPD  No active wheezing.  * Chronic renal  failure  Creatinine and electrolytes are stable.  * Chronic back pain  Continue fentanyl patch.  * Recent cellulitis  Currently her legs looks fine, as she finished her course of doxycycline yesterday.  She denies any diarrhea.   All the records are reviewed and case discussed with ED provider. Management plans discussed with the patient, family and they are in agreement.  CODE STATUS: full    TOTAL TIME TAKING CARE OF THIS PATIENT: 50 minutes.    Vaughan Basta M.D on 03/15/2015   Between 7am to 6pm - Pager - [phone]  After 6pm go to [email or web address] - password [email or web address] [facility]  Office  [phone]  CC: Primary care physician; Albina Billet, MD

## 2015-03-16 ENCOUNTER — Observation Stay: Payer: Medicare Other

## 2015-03-16 LAB — BASIC METABOLIC PANEL
ANION GAP: 9 (ref 5–15)
BUN: 47 mg/dL — AB (ref 6–20)
CALCIUM: 8.9 mg/dL (ref 8.9–10.3)
CHLORIDE: 104 mmol/L (ref 101–111)
CO2: 27 mmol/L (ref 22–32)
CREATININE: 2.79 mg/dL — AB (ref 0.44–1.00)
GFR, EST AFRICAN AMERICAN: 16 mL/min — AB (ref >60)
GFR, EST NON AFRICAN AMERICAN: 14 mL/min — AB (ref >60)
Glucose, Bld: 88 mg/dL (ref 65–99)
POTASSIUM: 3.8 mmol/L (ref 3.5–5.1)
SODIUM: 140 mmol/L (ref 135–145)

## 2015-03-16 LAB — CBC
HCT: 22.1 % — ABNORMAL LOW (ref 35.0–47.0)
Hemoglobin: 7.3 g/dL — ABNORMAL LOW (ref 12.0–16.0)
MCH: 32.1 pg (ref 26.0–34.0)
MCHC: 33.1 g/dL (ref 32.0–36.0)
MCV: 97 fL (ref 80.0–100.0)
PLATELETS: 203 10*3/uL (ref 150–440)
RBC: 2.28 MIL/uL — ABNORMAL LOW (ref 3.80–5.20)
RDW: 13.9 % (ref 11.5–14.5)
WBC: 10.3 10*3/uL (ref 3.6–11.0)

## 2015-03-16 MED ORDER — HYDRALAZINE HCL 50 MG PO TABS
100.0000 mg | ORAL_TABLET | Freq: Three times a day (TID) | ORAL | Status: DC
  Administered 2015-03-16 – 2015-03-18 (×7): 100 mg via ORAL
  Filled 2015-03-16 (×6): qty 2

## 2015-03-16 MED ORDER — ACETAMINOPHEN 325 MG PO TABS
650.0000 mg | ORAL_TABLET | Freq: Four times a day (QID) | ORAL | Status: DC | PRN
  Administered 2015-03-16 – 2015-03-18 (×5): 650 mg via ORAL
  Filled 2015-03-16 (×6): qty 2

## 2015-03-16 MED ORDER — LIDOCAINE 5 % EX PTCH
1.0000 | MEDICATED_PATCH | CUTANEOUS | Status: DC
  Administered 2015-03-16 – 2015-03-18 (×3): 1 via TRANSDERMAL
  Filled 2015-03-16 (×4): qty 1

## 2015-03-16 MED ORDER — POTASSIUM CHLORIDE IN NACL 20-0.9 MEQ/L-% IV SOLN
INTRAVENOUS | Status: DC
  Administered 2015-03-16: 10:00:00 via INTRAVENOUS
  Filled 2015-03-16: qty 1000

## 2015-03-16 MED ORDER — LEVOFLOXACIN 250 MG PO TABS
250.0000 mg | ORAL_TABLET | ORAL | Status: DC
  Administered 2015-03-16 – 2015-03-18 (×2): 250 mg via ORAL
  Filled 2015-03-16 (×2): qty 1

## 2015-03-16 NOTE — Plan of Care (Signed)
Problem: Discharge Progression Outcomes Goal: Discharge plan in place and appropriate Outcome: Progressing Pt lives at home with daughter until today she was able to ambulte with walker, weakness has been gradual per daughter. Pt has hx of COPD, sleep apnea, CHF, chronic kidney disease, gastroesophageal reflux disease, chronic anemia, chronic back pain, episode of TIA in the past-  Controlled with home medication  Goal: Other Discharge Outcomes/Goals Outcome: Progressing Pt admitted with weakness has been gradually getting weak for last 1 month, she lives with daughter, and as per her she was able to walk with a walker in the house to go to the bathroom and move around , has home health rn, cna and pt in the home. Pt on bedrest this shift, using bedpan, able to turn self in bed. VSS continue to monitor

## 2015-03-16 NOTE — Plan of Care (Signed)
Problem: Discharge Progression Outcomes Goal: Other Discharge Outcomes/Goals Outcome: Progressing Pt A&O; remains on High Fall Risks, pt 2 person maximum assist oob to bsc, had large dark stool this am; PT eval performed recommend rehab at discharge; started IVF as ordered by MD this shift, pt started feeling tightness and numbness in left arm, has history of CHF, IVF discontinued per MD order; PRN Tylenol given for complaint of headache with relief; applied Lidoderm patch to right hip (pt verbalized to place it there) due to pt verbalizing that she is on this at home for chronic back pain and sciatic pain; pt refuses to wear SCDs as ordered in [facility]

## 2015-03-16 NOTE — Clinical Social Work Placement (Signed)
   CLINICAL SOCIAL WORK PLACEMENT  NOTE  Date:  03/16/2015  Patient Details  Name: Kristina Wilson MRN: [id number] Date of Birth: 1922/12/18  Clinical Social Work is seeking post-discharge placement for this patient at the Skilled  Nursing Facility level of care (*CSW will initial, date and re-position this form in  chart as items are completed):  Yes   Patient/family provided with [facility] Clinical Social Work Department's list of facilities offering this level of care within the geographic area requested by the patient (or if unable, by the patient's family).  Yes   Patient/family informed of their freedom to choose among providers that offer the needed level of care, that participate in Medicare, Medicaid or managed care program needed by the patient, have an available bed and are willing to accept the patient.  Yes   Patient/family informed of [facility]'s ownership interest in [facility] and [facility], as well as of the fact that they are under no obligation to receive care at these facilities.  PASRR submitted to EDS on       PASRR number received on       Existing PASRR number confirmed on 03/16/15     FL2 transmitted to all facilities in geographic area requested by pt/family on 03/16/15     FL2 transmitted to all facilities within larger geographic area on       Patient informed that his/her managed care company has contracts with or will negotiate with certain facilities, including the following:            Patient/family informed of bed offers received.  Patient chooses bed at       Physician recommends and patient chooses bed at      Patient to be transferred to   on  .  Patient to be transferred to facility by       Patient family notified on   of transfer.  Name of family member notified:        PHYSICIAN       Additional Comment:    _______________________________________________ Soundra Pilon, LCSW 03/16/2015, 2:16 PM

## 2015-03-16 NOTE — Progress Notes (Signed)
[facility] - [place] at [facility], [place]   PATIENT NAME: Kristina Wilson    MR#:  [id number]  DATE OF BIRTH:  Oct 12, 1922  SUBJECTIVE:  CHIEF COMPLAINT:   Chief Complaint  Patient presents with  . Fatigue   Lower extremity weakness. Complains of burning with urination. No hematuria. No SOB. Not on Home O2. Has chronic anemia  REVIEW OF SYSTEMS:    Review of Systems  Constitutional: Positive for malaise/fatigue. Negative for fever and chills.  HENT: Negative for sore throat.   Eyes: Negative for blurred vision, double vision and pain.  Respiratory: Negative for cough, hemoptysis, shortness of breath and wheezing.   Cardiovascular: Positive for leg swelling. Negative for chest pain, palpitations and orthopnea.  Gastrointestinal: Positive for nausea. Negative for heartburn, vomiting, abdominal pain, diarrhea and constipation.  Genitourinary: Positive for dysuria. Negative for hematuria.  Musculoskeletal: Negative for back pain and joint pain.  Skin: Negative for rash.  Neurological: Positive for tingling and weakness. Negative for sensory change, speech change, focal weakness and headaches.  Endo/Heme/Allergies: Does not bruise/bleed easily.  Psychiatric/Behavioral: Negative for depression. The patient is not nervous/anxious.       DRUG ALLERGIES:   Allergies  Allergen Reactions  . Enalapril Anaphylaxis  . Tramadol Shortness Of Breath  . Capsaicin Other (See Comments)    Reaction:  Unknown   . Codeine Other (See Comments)    Reaction:  Unknown   . Hydrocodone Other (See Comments)    Reaction:  Unknown   . Oxycodone Other (See Comments)    Reaction:  Unknown   . Vicodin [Hydrocodone-Acetaminophen] Other (See Comments)    Reaction:  Unknown     VITALS:  Blood pressure 187/44, pulse 69, temperature 98 F (36.7 C), temperature source Oral, resp. rate 18, height  (1.626 m), weight 94.257 kg (207 lb 12.8 oz), SpO2 98 %.  PHYSICAL EXAMINATION:    Physical Exam  GENERAL:  81 y.o.-year-old patient lying in the bed with no acute distress. Obese. EYES: Pupils equal, round, reactive to light and accommodation. No scleral icterus. Extraocular muscles intact.  HEENT: Head atraumatic, normocephalic. Oropharynx and nasopharynx clear.  NECK:  Supple, no jugular venous distention. No thyroid enlargement, no tenderness.  LUNGS: Normal breath sounds bilaterally, no wheezing. Crackles left base. CARDIOVASCULAR: S1, S2 normal. No murmurs, rubs, or gallops.  ABDOMEN: Soft, nontender, nondistended. Bowel sounds present. No organomegaly or mass.  EXTREMITIES: No cyanosis, clubbing or edema b/l.    NEUROLOGIC: Cranial nerves II through XII are intact. No focal Motor or sensory deficits b/l.   PSYCHIATRIC: The patient is alert and oriented x 3.  SKIN: No obvious rash, lesion, or ulcer.   SLR positive    LABORATORY PANEL:   CBC  Recent Labs Lab 03/16/15 0442  WBC 10.3  HGB 7.3*  HCT 22.1*  PLT 203   ------------------------------------------------------------------------------------------------------------------  Chemistries   Recent Labs Lab 03/15/15 0944 03/16/15 0442  NA 137 140  K 3.6 3.8  CL 101 104  CO2 25 27  GLUCOSE 92 88  BUN 46* 47*  CREATININE 2.66* 2.79*  CALCIUM 9.3 8.9  AST 17  --   ALT 11*  --   ALKPHOS 44  --   BILITOT 0.6  --    ------------------------------------------------------------------------------------------------------------------  Cardiac Enzymes  Recent Labs Lab 03/15/15 0944  TROPONINI 0.03   ------------------------------------------------------------------------------------------------------------------  RADIOLOGY:  Dg Thoracic Spine 2 View  03/15/2015   CLINICAL DATA:  Fall.  EXAM: THORACIC SPINE 2 VIEWS  COMPARISON:  March 03, 2015.  FINDINGS: Moderate dextroscoliosis of the thoracic spine is noted. No fracture or spondylolisthesis is noted. Multilevel degenerative disc disease  is noted throughout the thoracic spine.  IMPRESSION: Degenerative changes as described above. No acute abnormality seen in the thoracic spine.   Electronically Signed   By: Lupita Raider, M.D.   On: 03/15/2015 20:23   Dg Lumbar Spine 2-3 Views  03/15/2015   CLINICAL DATA:  Sudden onset of weakness in severe pain in the back.  EXAM: LUMBAR SPINE - 2-3 VIEW  COMPARISON:  Lumbar spine radiographs 11/03/2014.  FINDINGS: Five non rib-bearing lumbar type vertebral bodies are present. Moderate osteopenia is again seen. A remote superior endplate fracture is present at L1, L2, and L4 without significant change. No acute fractures are present. Atherosclerotic calcifications are again seen within the aorta and branch vessels.  IMPRESSION: 1. No acute abnormality or significant interval change. 2. Remote lumbar compression fractures at L1, L2, and L4 are stable.   Electronically Signed   By: Marin Roberts M.D.   On: 03/15/2015 20:30   Ct Head Wo Contrast  03/15/2015   CLINICAL DATA:  PICC line and functional capabilities over past 24-48 hours, now unable to walk or care for self, weakness, history CHF, GERD, TIA, former smoker  EXAM: CT HEAD WITHOUT CONTRAST  TECHNIQUE: Contiguous axial images were obtained from the base of the skull through the vertex without intravenous contrast.  COMPARISON:  12/07/2014  FINDINGS: Age-related atrophy.  Normal ventricular morphology.  No midline shift or mass effect.  Few scattered artifacts from skull.  No definite intracranial hemorrhage, mass lesion, or evidence acute infarction.  No extra-axial fluid collections.  Bones demineralized.  Atherosclerotic calcifications at the carotid siphons.  Minimal mucosal thickening in the maxillary sinuses.  IMPRESSION: Age-related atrophy.  No acute intracranial abnormalities.   Electronically Signed   By: Ulyses Southward M.D.   On: 03/15/2015 17:04   Ct Chest Wo Contrast  03/15/2015   CLINICAL DATA:  Generalized muscle weakness for  several days, abnormal chest radiograph with persistent LEFT lower lobe process, former smoker,  EXAM: CT CHEST WITHOUT CONTRAST  TECHNIQUE: Multidetector CT imaging of the chest was performed following the standard protocol without IV contrast. Sagittal and coronal MPR images reconstructed from axial data set.  COMPARISON:  10/07/2014  FINDINGS: Scattered atherosclerotic calcifications aorta, proximal great vessels and coronary arteries.  Mitral annular calcification noted.  Enlarged central pulmonary arteries.  Visualized upper abdomen grossly unremarkable.  No thoracic adenopathy.  Bones diffusely demineralized with multilevel degenerative disc disease changes of the thoracic spine and chronic compression deformity of a mid thoracic vertebra.  Slightly increased moderate LEFT pleural effusion.  Scattered respiratory motion artifacts.  Significant atelectasis LEFT lower lobe with dependent atelectasis at posterior aspect of LEFT upper lobe as well.  Scarring at medial aspect RIGHT lower lobe.  No pulmonary infiltrate, pneumothorax, or definite mass/nodule.  IMPRESSION: Slightly increased LEFT pleural effusion and lower lobe atelectasis.  Scattered atherosclerotic disease changes.  Enlarged central pulmonary arteries question pulmonary arterial hypertension.  No additional intra thoracic abnormalities identified.   Electronically Signed   By: Ulyses Southward M.D.   On: 03/15/2015 11:39   Dg Chest Portable 1 View  03/15/2015   CLINICAL DATA:  2 hr history of weakness.  EXAM: PORTABLE CHEST - 1 VIEW  COMPARISON:  03/03/2015  FINDINGS: The heart is mildly enlarged but stable. There is tortuosity and calcification of the thoracic aorta. Persistent left lower lobe  process appears to be a combination of effusion, atelectasis and or infiltrate. The bony thorax is intact.  IMPRESSION: Stable cardiac enlargement and persist left lower lobe process.   Electronically Signed   By: Rudie Meyer M.D.   On: 03/15/2015 10:24      ASSESSMENT AND PLAN:   * Generalized weakness Likely multifactorial with dehydration and possible urinary tract infection. She does have chronic generalized weakness and fatigue. Consult physical therapy. She may need discharge to SNF prior to transitioning to home.  * Urinary tract infection Start Levaquin. There is some concern regarding infiltrate versus atelectasis at the left base. Levaquin should also cover for possible pneumonia although seems unlikely. We will send for urine culture.  * Dehydration Start IV fluids.  * Uncontrolled hypertension Continue home medications. Increase dose of hydralazine.  * COPD No active wheezing.  * CKD stage III Creatinine and BUN have slowly trended up. This is in part due to dehydration. Will start patient on IV fluids and repeat labs in the morning. Check daily weight and input and output.  * Chronic back pain Continue fentanyl patch.  * Recent cellulitis Currently her legs looks fine, as she finished her course of doxycycline yesterday.   All the records are reviewed and case discussed with Care Management/Social Workerr. Management plans discussed with the patient, family and they are in agreement.  CODE STATUS: FULL  DVT Prophylaxis: SCDs  TOTAL TIME TAKING CARE OF THIS PATIENT: 35 minutes.   POSSIBLE D/C IN 2-3 DAYS, DEPENDING ON CLINICAL CONDITION.   Milagros Loll R M.D on 03/16/2015 at 9:32 AM  Between 7am to 6pm - Pager - [phone]  After 6pm go to [email or web address] - password [email or web address] [facility]  [place] [facility]  Office  [phone]  CC: Primary care physician; Jaclyn Shaggy, MD

## 2015-03-16 NOTE — Progress Notes (Signed)
ANTIBIOTIC CONSULT NOTE - INITIAL  Pharmacy Consult for Levaquin Indication: UTI  Allergies  Allergen Reactions  . Enalapril Anaphylaxis  . Tramadol Shortness Of Breath  . Capsaicin Other (See Comments)    Reaction:  Unknown   . Codeine Other (See Comments)    Reaction:  Unknown   . Hydrocodone Other (See Comments)    Reaction:  Unknown   . Oxycodone Other (See Comments)    Reaction:  Unknown   . Vicodin [Hydrocodone-Acetaminophen] Other (See Comments)    Reaction:  Unknown     Patient Measurements: Height:  (162.6 cm) Weight: 207 lb 12.8 oz (94.257 kg) IBW/kg (Calculated) : 54.7   Vital Signs: Temp: 98 F (36.7 C) (08/13 0522) Temp Source: Oral (08/13 0522) BP: 187/44 mmHg (08/13 0522) Pulse Rate: 69 (08/13 0522) Intake/Output from previous day: 08/12 0701 - 08/13 0700 In: -  Out: 600 [Urine:600] Intake/Output from this shift:    Labs:  Recent Labs  03/15/15 0944 03/16/15 0442  WBC 13.5* 10.3  HGB 8.7* 7.3*  PLT 262 203  CREATININE 2.66* 2.79*   Estimated Creatinine Clearance: 14.9 mL/min (by C-G formula based on Cr of 2.79). No results for input(s): VANCOTROUGH, VANCOPEAK, VANCORANDOM, GENTTROUGH, GENTPEAK, GENTRANDOM, TOBRATROUGH, TOBRAPEAK, TOBRARND, AMIKACINPEAK, AMIKACINTROU, AMIKACIN in the last 72 hours.   Microbiology: No results found for this or any previous visit (from the past 720 hour(s)).  Medical History: Past Medical History  Diagnosis Date  . Muscle weakness   . Chronic respiratory failure   . Pneumonia   . Sleep apnea   . CHF (congestive heart failure)   . Chronic kidney disease   . Dysphagia   . GERD (gastroesophageal reflux disease)   . Anemia   . Back pain   . Epilepsy   . TIA (transient ischemic attack)      Assessment: Patient is a 81 yo female.  MD consulted pharmacy to dose Levaquin for UTI. Patient with chronic renal failure. Per notes, will obtain urine cultures.  SCr: 2.79, est CrCl~14.9 mL/min  Goal of  Therapy:  Resolution of infection  Plan:  Will order Levaquin 250 mg po q48h.  Dosing adjusted based on renal function and indication (compliated UTI d/t renal failure).  Pharmacy will continue to follow.  Clarisa Schools, PharmD Clinical Pharmacist 03/16/2015

## 2015-03-16 NOTE — Evaluation (Signed)
Physical Therapy Evaluation Patient Details Name: Kristina Wilson MRN: [id number] DOB: Jun 18, 1923 Today's Date: 03/16/2015   History of Present Illness  Kristina Wilson is a 81 y.o. female with a known history of COPD, sleep apnea, CHF, chronic kidney disease, gastroesophageal reflux disease, chronic anemia, chronic back pain, episode of TIA in the past- has been gradually getting weak for last 1 month, she lives with daughter, and as per her she was able to walk with a walker in the house to go to the bathroom and move around. She has been having this same functional status for last 1-1/2 month, but for the last 2 days daughter feels something is wrong. reports suddenly having more LBP, and her RLE feels "odd"  Clinical Impression  Pt presents with recent onset of leg weakness, limiting her ability to stand. Pt reports her RLE feels "odd" upon eval pt does have significant RLE weakness vs. LLE especially in the hip flexor, quadriceps and hamstrings. Pt reports her "sciatica" is feeling much worse as well which is limiting her ability, accompanied with RLE weakness. Pt demonstrates good motivation to stand up however her pain was 8-10/10 (nursing was notified). Pt would benefit from continued skilled PT services to address pain, mobility, strength and transfers to maximize independence mobility.     Follow Up Recommendations SNF    Equipment Recommendations  Rolling walker with 5" wheels    Recommendations for Other Services       Precautions / Restrictions Precautions Precautions: Fall Restrictions Weight Bearing Restrictions: No      Mobility  Bed Mobility Overal bed mobility: Needs Assistance Bed Mobility: Supine to Sit;Sit to Supine     Supine to sit: Min assist Sit to supine: Min assist   General bed mobility comments: pt able to scoot in bed with HOB in decline, pt requires assit with her extremities during bed mobility   Transfers Overall transfer level: Needs  assistance Equipment used: Rolling walker (2 wheeled) Transfers: Sit to/from Stand Sit to Stand: Mod assist         General transfer comment: +1 mod A for sit to stand with RW, limited by pain/weakness  Ambulation/Gait             General Gait Details: deferred. pt unable to ambulate today  Stairs            Wheelchair Mobility    Modified Rankin (Stroke Patients Only)       Balance                                             Pertinent Vitals/Pain Pain Assessment: 0-10 Pain Score: 8  Pain Descriptors / Indicators: Sharp Pain Intervention(s): Limited activity within patient's tolerance;Monitored during session    Home Living Family/patient expects to be discharged to:: Private residence Living Arrangements: Children Available Help at Discharge: Family Type of Home: House       Home Layout: Two level (has chair lift) Home Equipment: Environmental consultant - 2 wheels      Prior Function Level of Independence: Independent with assistive device(s)               Hand Dominance        Extremity/Trunk Assessment   Upper Extremity Assessment: Overall WFL for tasks assessed           Lower Extremity Assessment: RLE deficits/detail RLE Deficits /  Details: R hip flexion 2/5, R knee ext 3+/5, R hamstrings 3+/5, R ankle DF 4/5, PF 4/5 , LLE is 4/5 throughout     Cervical / Trunk Assessment: Kyphotic  Communication   Communication: No difficulties  Cognition Arousal/Alertness: Awake/alert Behavior During Therapy: WFL for tasks assessed/performed Overall Cognitive Status: Within Functional Limits for tasks assessed                      General Comments      Exercises        Assessment/Plan    PT Assessment Patient needs continued PT services  PT Diagnosis Difficulty walking   PT Problem List Decreased strength;Decreased activity tolerance;Decreased mobility;Pain  PT Treatment Interventions Gait training;Functional  mobility training;Therapeutic activities;Therapeutic exercise;Balance training;Manual techniques;Modalities   PT Goals (Current goals can be found in the Care Plan section) Acute Rehab PT Goals Patient Stated Goal: get stronger, get out of bed PT Goal Formulation: With patient Time For Goal Achievement: 03/30/15 Potential to Achieve Goals: Fair    Frequency Min 2X/week   Barriers to discharge        Co-evaluation               End of Session Equipment Utilized During Treatment: Gait belt Activity Tolerance: Patient limited by pain Patient left: in bed      Functional Assessment Tool Used: clinical judgement  Functional Limitation: Mobility: Walking and moving around Mobility: Walking and Moving Around Current Status [phone]): At least 80 percent but less than 100 percent impaired, limited or restricted Mobility: Walking and Moving Around Goal Status [phone]): At least 60 percent but less than 80 percent impaired, limited or restricted    Time: 1245-1310 PT Time Calculation (min) (ACUTE ONLY): 25 min   Charges:   PT Evaluation $Initial PT Evaluation Tier I: 1 Procedure     PT G Codes:   PT G-Codes **NOT FOR INPATIENT CLASS** Functional Assessment Tool Used: clinical judgement  Functional Limitation: Mobility: Walking and moving around Mobility: Walking and Moving Around Current Status (U2725): At least 80 percent but less than 100 percent impaired, limited or restricted Mobility: Walking and Moving Around Goal Status [phone]): At least 60 percent but less than 80 percent impaired, limited or restricted   Morrie Sheldon C. Cerria Randhawa, PT, DPT #[id number]  [name] 03/16/2015, 1:28 PM

## 2015-03-17 LAB — BASIC METABOLIC PANEL
ANION GAP: 10 (ref 5–15)
BUN: 49 mg/dL — AB (ref 6–20)
CALCIUM: 9 mg/dL (ref 8.9–10.3)
CO2: 26 mmol/L (ref 22–32)
Chloride: 103 mmol/L (ref 101–111)
Creatinine, Ser: 2.63 mg/dL — ABNORMAL HIGH (ref 0.44–1.00)
GFR calc Af Amer: 17 mL/min — ABNORMAL LOW (ref >60)
GFR, EST NON AFRICAN AMERICAN: 15 mL/min — AB (ref >60)
GLUCOSE: 99 mg/dL (ref 65–99)
POTASSIUM: 4 mmol/L (ref 3.5–5.1)
SODIUM: 139 mmol/L (ref 135–145)

## 2015-03-17 LAB — CBC
HEMATOCRIT: 25 % — AB (ref 35.0–47.0)
Hemoglobin: 8.1 g/dL — ABNORMAL LOW (ref 12.0–16.0)
MCH: 31.7 pg (ref 26.0–34.0)
MCHC: 32.4 g/dL (ref 32.0–36.0)
MCV: 97.8 fL (ref 80.0–100.0)
Platelets: 207 10*3/uL (ref 150–440)
RBC: 2.56 MIL/uL — ABNORMAL LOW (ref 3.80–5.20)
RDW: 13.7 % (ref 11.5–14.5)
WBC: 8.2 10*3/uL (ref 3.6–11.0)

## 2015-03-17 LAB — PHENYTOIN LEVEL, TOTAL: PHENYTOIN LVL: 7.4 ug/mL — AB (ref 10.0–20.0)

## 2015-03-17 MED ORDER — CLONIDINE HCL 0.1 MG PO TABS
0.3000 mg | ORAL_TABLET | Freq: Three times a day (TID) | ORAL | Status: DC
  Administered 2015-03-17 – 2015-03-18 (×2): 0.3 mg via ORAL
  Filled 2015-03-17 (×2): qty 3

## 2015-03-17 MED ORDER — CLONIDINE HCL 0.1 MG PO TABS
0.3000 mg | ORAL_TABLET | Freq: Once | ORAL | Status: AC
  Administered 2015-03-17: 18:00:00 0.3 mg via ORAL
  Filled 2015-03-17: qty 3

## 2015-03-17 NOTE — Progress Notes (Signed)
[facility] - [place] at [facility]   PATIENT NAME: Kristina Wilson    MR#:  [id number]  DATE OF BIRTH:  July 07, 1923  SUBJECTIVE:  CHIEF COMPLAINT:   Chief Complaint  Patient presents with  . Fatigue   Lower extremity weakness. Complains of burning with urination. No hematuria. Improving No SOB. Not on Home O2. Has chronic anemia.  REVIEW OF SYSTEMS:    Review of Systems  Constitutional: Positive for malaise/fatigue. Negative for fever and chills.  HENT: Negative for sore throat.   Eyes: Negative for blurred vision, double vision and pain.  Respiratory: Negative for cough, hemoptysis, shortness of breath and wheezing.   Cardiovascular: Positive for leg swelling. Negative for chest pain, palpitations and orthopnea.  Gastrointestinal: Positive for nausea. Negative for heartburn, vomiting, abdominal pain, diarrhea and constipation.  Genitourinary: Positive for dysuria. Negative for hematuria.  Musculoskeletal: Negative for back pain and joint pain.  Skin: Negative for rash.  Neurological: Positive for tingling and weakness. Negative for sensory change, speech change, focal weakness and headaches.  Endo/Heme/Allergies: Does not bruise/bleed easily.  Psychiatric/Behavioral: Negative for depression. The patient is not nervous/anxious.       DRUG ALLERGIES:   Allergies  Allergen Reactions  . Enalapril Anaphylaxis  . Tramadol Shortness Of Breath  . Capsaicin Other (See Comments)    Reaction:  Unknown   . Codeine Other (See Comments)    Reaction:  Unknown   . Hydrocodone Other (See Comments)    Reaction:  Unknown   . Oxycodone Other (See Comments)    Reaction:  Unknown   . Vicodin [Hydrocodone-Acetaminophen] Other (See Comments)    Reaction:  Unknown     VITALS:  Blood pressure 184/62, pulse 78, temperature 98.4 F (36.9 C), temperature source Oral, resp. rate 18, height 5\' 4"  (1.626 m), weight 94.257 kg (207 lb 12.8 oz), SpO2 97 %.  PHYSICAL  EXAMINATION:   Physical Exam  GENERAL:  81 y.o.-year-old patient lying in the bed with no acute distress. Obese. EYES: Pupils equal, round, reactive to light and accommodation. No scleral icterus. Extraocular muscles intact.  HEENT: Head atraumatic, normocephalic. Oropharynx and nasopharynx clear.  NECK:  Supple, no jugular venous distention. No thyroid enlargement, no tenderness.  LUNGS: Normal breath sounds bilaterally, no wheezing. Crackles left base. CARDIOVASCULAR: S1, S2 normal. No murmurs, rubs, or gallops.  ABDOMEN: Soft, nontender, nondistended. Bowel sounds present. No organomegaly or mass.  EXTREMITIES: No cyanosis, clubbing or edema b/l.    NEUROLOGIC: Cranial nerves II through XII are intact. No focal Motor or sensory deficits b/l.   PSYCHIATRIC: The patient is alert and oriented x 3.  SKIN: No obvious rash, lesion, or ulcer.   SLR positive    LABORATORY PANEL:   CBC  Recent Labs Lab 03/17/15 0434  WBC 8.2  HGB 8.1*  HCT 25.0*  PLT 207   ------------------------------------------------------------------------------------------------------------------  Chemistries   Recent Labs Lab 03/15/15 0944  03/17/15 0434  NA 137  < > 139  K 3.6  < > 4.0  CL 101  < > 103  CO2 25  < > 26  GLUCOSE 92  < > 99  BUN 46*  < > 49*  CREATININE 2.66*  < > 2.63*  CALCIUM 9.3  < > 9.0  AST 17  --   --   ALT 11*  --   --   ALKPHOS 44  --   --   BILITOT 0.6  --   --   < > =  values in this interval not displayed. ------------------------------------------------------------------------------------------------------------------  Cardiac Enzymes  Recent Labs Lab 03/15/15 0944  TROPONINI 0.03   ------------------------------------------------------------------------------------------------------------------  RADIOLOGY:  Dg Chest 2 View  03/16/2015   CLINICAL DATA:  81 year old female with progressive weakness over the past month. Past medical history is extensive and  includes chronic respiratory failure, COPD, CHF and anemia  EXAM: CHEST  2 VIEW  COMPARISON:  Prior chest x-ray 03/15/2015  FINDINGS: Stable cardiomegaly. Mediastinal contours remain unchanged given patient's leftward rotated position. Atherosclerotic calcification again present in the transverse aorta. No evidence of pulmonary edema or new airspace consolidation on today's evaluation. Persistent moderate left pleural effusion with associated left lower lobe atelectasis. No acute osseous abnormality.  IMPRESSION: 1. Stable chest x-ray without evidence of acute cardiopulmonary process. 2. Unchanged moderate volume left layering pleural effusion with associated left lower lobe atelectasis.   Electronically Signed   By: Malachy Moan M.D.   On: 03/16/2015 13:10   Dg Thoracic Spine 2 View  03/15/2015   CLINICAL DATA:  Fall.  EXAM: THORACIC SPINE 2 VIEWS  COMPARISON:  March 03, 2015.  FINDINGS: Moderate dextroscoliosis of the thoracic spine is noted. No fracture or spondylolisthesis is noted. Multilevel degenerative disc disease is noted throughout the thoracic spine.  IMPRESSION: Degenerative changes as described above. No acute abnormality seen in the thoracic spine.   Electronically Signed   By: Lupita Raider, M.D.   On: 03/15/2015 20:23   Dg Lumbar Spine 2-3 Views  03/15/2015   CLINICAL DATA:  Sudden onset of weakness in severe pain in the back.  EXAM: LUMBAR SPINE - 2-3 VIEW  COMPARISON:  Lumbar spine radiographs 11/03/2014.  FINDINGS: Five non rib-bearing lumbar type vertebral bodies are present. Moderate osteopenia is again seen. A remote superior endplate fracture is present at L1, L2, and L4 without significant change. No acute fractures are present. Atherosclerotic calcifications are again seen within the aorta and branch vessels.  IMPRESSION: 1. No acute abnormality or significant interval change. 2. Remote lumbar compression fractures at L1, L2, and L4 are stable.   Electronically Signed   By:  Marin Roberts M.D.   On: 03/15/2015 20:30   Ct Head Wo Contrast  03/15/2015   CLINICAL DATA:  PICC line and functional capabilities over past 24-48 hours, now unable to walk or care for self, weakness, history CHF, GERD, TIA, former smoker  EXAM: CT HEAD WITHOUT CONTRAST  TECHNIQUE: Contiguous axial images were obtained from the base of the skull through the vertex without intravenous contrast.  COMPARISON:  12/07/2014  FINDINGS: Age-related atrophy.  Normal ventricular morphology.  No midline shift or mass effect.  Few scattered artifacts from skull.  No definite intracranial hemorrhage, mass lesion, or evidence acute infarction.  No extra-axial fluid collections.  Bones demineralized.  Atherosclerotic calcifications at the carotid siphons.  Minimal mucosal thickening in the maxillary sinuses.  IMPRESSION: Age-related atrophy.  No acute intracranial abnormalities.   Electronically Signed   By: Ulyses Southward M.D.   On: 03/15/2015 17:04   Ct Chest Wo Contrast  03/15/2015   CLINICAL DATA:  Generalized muscle weakness for several days, abnormal chest radiograph with persistent LEFT lower lobe process, former smoker,  EXAM: CT CHEST WITHOUT CONTRAST  TECHNIQUE: Multidetector CT imaging of the chest was performed following the standard protocol without IV contrast. Sagittal and coronal MPR images reconstructed from axial data set.  COMPARISON:  10/07/2014  FINDINGS: Scattered atherosclerotic calcifications aorta, proximal great vessels and coronary arteries.  Mitral annular  calcification noted.  Enlarged central pulmonary arteries.  Visualized upper abdomen grossly unremarkable.  No thoracic adenopathy.  Bones diffusely demineralized with multilevel degenerative disc disease changes of the thoracic spine and chronic compression deformity of a mid thoracic vertebra.  Slightly increased moderate LEFT pleural effusion.  Scattered respiratory motion artifacts.  Significant atelectasis LEFT lower lobe with  dependent atelectasis at posterior aspect of LEFT upper lobe as well.  Scarring at medial aspect RIGHT lower lobe.  No pulmonary infiltrate, pneumothorax, or definite mass/nodule.  IMPRESSION: Slightly increased LEFT pleural effusion and lower lobe atelectasis.  Scattered atherosclerotic disease changes.  Enlarged central pulmonary arteries question pulmonary arterial hypertension.  No additional intra thoracic abnormalities identified.   Electronically Signed   By: Ulyses Southward M.D.   On: 03/15/2015 11:39   Dg Chest Portable 1 View  03/15/2015   CLINICAL DATA:  2 hr history of weakness.  EXAM: PORTABLE CHEST - 1 VIEW  COMPARISON:  03/03/2015  FINDINGS: The heart is mildly enlarged but stable. There is tortuosity and calcification of the thoracic aorta. Persistent left lower lobe process appears to be a combination of effusion, atelectasis and or infiltrate. The bony thorax is intact.  IMPRESSION: Stable cardiac enlargement and persist left lower lobe process.   Electronically Signed   By: Rudie Meyer M.D.   On: 03/15/2015 10:24     ASSESSMENT AND PLAN:   * Generalized weakness Likely multifactorial with dehydration and possible urinary tract infection. She does have chronic generalized weakness and fatigue. Consult physical therapy. She may need discharge to SNF prior to transitioning to home.  * Urinary tract infection Started Levaquin. There is some concern regarding infiltrate versus atelectasis at the left base. Levaquin should also cover for possible pneumonia although seems unlikely.  Cx will likely be negative due to being on abx.   * Dehydration Resolved  * Uncontrolled hypertension - Improving Continue home medications. Increased dose of hydralazine.  * COPD Stable  * CKD stage III Stable  * Chronic back pain Continue fentanyl patch.  * Recent cellulitis Currently her legs looks fine, as she finished her course of doxycycline yesterday.   All the records are  reviewed and case discussed with Care Management/Social Workerr. Management plans discussed with the patient, family and they are in agreement.  CODE STATUS: FULL  DVT Prophylaxis: SCDs  TOTAL TIME TAKING CARE OF THIS PATIENT: 35 minutes.   D/C to SNF when bed available   Orie Fisherman M.D on 03/17/2015 at 8:27 AM  Between 7am to 6pm - Pager - [phone]  After 6pm go to [email or web address] - password [email or web address] [facility]  [place] [place] [facility]  Office  [phone]  CC: Primary care physician; Jaclyn Shaggy, MD

## 2015-03-17 NOTE — Plan of Care (Signed)
Problem: Discharge Progression Outcomes Goal: Discharge plan in place and appropriate Individualization of Care Pt prefers to be called Kristina Wilson Hx of CRF, muscle weakness, pneumonia, sleep apnea, CKD, dysphagia, GERD, anemia, back pain, epilepsy, TIA, CHF on home medications HIGH fall precautions.  Goal: Other Discharge Outcomes/Goals Plan of Care Progress to Goal:  Pt A&O; remains on High Fall Risks, pt 2 person maximum assist oob to bsc, had large dark stool this am; PT eval performed recommend rehab at discharge; pt refuses to wear SCDs as ordered in [facility], pt resting at intervals no s/s of distress

## 2015-03-17 NOTE — Plan of Care (Signed)
Problem: Discharge Progression Outcomes Goal: Pain controlled with appropriate interventions Outcome: Progressing Tylenol given today  And lidoderm patch on pt  Reports improvement in her back pain. Goal: Complications resolved/controlled Outcome: Progressing o2 2l [place]. Pt weaned to 2l [place] today will try to wean further am. Pt uese c pao at night. tol well. Goal: Tolerating diet Outcome: Progressing Able to tol diet. Goal: Activity appropriate for discharge plan Outcome: Progressing Cont weak / use walker to amb about.

## 2015-03-18 DIAGNOSIS — J019 Acute sinusitis, unspecified: Secondary | ICD-10-CM | POA: Diagnosis not present

## 2015-03-18 DIAGNOSIS — N390 Urinary tract infection, site not specified: Secondary | ICD-10-CM | POA: Diagnosis present

## 2015-03-18 MED ORDER — CLONIDINE HCL 0.3 MG PO TABS: 0.3000 mg | ORAL_TABLET | Freq: Three times a day (TID) | ORAL | Status: DC

## 2015-03-18 MED ORDER — FENTANYL 12 MCG/HR TD PT72: 12.5000 ug | MEDICATED_PATCH | TRANSDERMAL | Status: DC

## 2015-03-18 MED ORDER — LORATADINE 10 MG PO TABS: 10.0000 mg | ORAL_TABLET | Freq: Every day | ORAL | Status: DC

## 2015-03-18 MED ORDER — FLUTICASONE PROPIONATE 50 MCG/ACT NA SUSP: 2.0000 | Freq: Every day | NASAL | Status: DC

## 2015-03-18 MED ORDER — LIDOCAINE 5 % EX PTCH: 1.0000 | MEDICATED_PATCH | CUTANEOUS | Status: DC

## 2015-03-18 MED ORDER — LEVOFLOXACIN 250 MG PO TABS: 250.0000 mg | ORAL_TABLET | ORAL | Status: DC

## 2015-03-18 MED ORDER — FLUTICASONE PROPIONATE 50 MCG/ACT NA SUSP
2.0000 | Freq: Every day | NASAL | Status: DC
  Administered 2015-03-18: 2 via NASAL
  Filled 2015-03-18: qty 16

## 2015-03-18 MED ORDER — LORATADINE 10 MG PO TABS
10.0000 mg | ORAL_TABLET | Freq: Every day | ORAL | Status: DC
  Administered 2015-03-18: 10 mg via ORAL
  Filled 2015-03-18: qty 1

## 2015-03-18 NOTE — Discharge Summary (Signed)
[facility] - [place] at [facility]   PATIENT NAME: Kristina Wilson    MR#:  [id number]  DATE OF BIRTH:  06-06-1923  DATE OF ADMISSION:  03/15/2015 ADMITTING PHYSICIAN: Altamese Dilling, MD  DATE OF DISCHARGE: No discharge date for patient encounter.  PRIMARY CARE PHYSICIAN: TATE,DENNY C, MD    ADMISSION DIAGNOSIS:  Back pain [M54.9] Pneumonia [J18.9] Failure to thrive (0-17) [R62.51] Inability to walk [R26.2] Weakness of both lower limbs [R29.898] Generalized weakness [R53.1]  DISCHARGE DIAGNOSIS:  Principal Problem:   Weakness of both lower limbs Active Problems:   UTI (urinary tract infection)   Acute sinusitis   SECONDARY DIAGNOSIS:   Past Medical History  Diagnosis Date  . Muscle weakness   . Chronic respiratory failure   . Pneumonia   . Sleep apnea   . CHF (congestive heart failure)   . Chronic kidney disease   . Dysphagia   . GERD (gastroesophageal reflux disease)   . Anemia   . Back pain   . Epilepsy   . TIA (transient ischemic attack)      ADMITTING HISTORY  HISTORY OF PRESENT ILLNESS: Kristina Wilson is a 81 y.o. female with a known history of COPD, sleep apnea, CHF, chronic kidney disease, gastroesophageal reflux disease, chronic anemia, chronic back pain, episode of TIA in the past- has been gradually getting weak for last 1 month, she lives with daughter, and as per her she was able to walk with a walker in the house to go to the bathroom and move around. She has been having this same functional status for last 1-1/2 month, but for the last 2 days daughter feels something is wrong she could not pinpoint what exactly. Today when patient tried to go to the bathroom with her walker after walking a few steps she felt extremely weak, had severe pain in her back, and she almost bend over and could not support herself, the daughter tried to hold her but finally she had to slowly let her see down on the floor. She did not fall,  and daughter had to support her to go back to the chair. Concerned with this she brought her to the emergency room.  In emergency room she was noted to have negative CT scan of the head and CT scan of the chest and urinalysis, ER physician tried to work with Child psychotherapist to arrange for her rehabilitation placement, but could not do that as her insurance requires her to be admitted to the hospital. So ER physician requested admission for functional decline.  On further questioning I got history from daughter patient was treated for her cellulitis on both legs in last 10 days- with doxycycline her last dose was yesterday. She also has been following up with Dr. Cherylann Ratel in the office for her chronic renal failure. She has some vague complain of abdominal pain in central area, without any nausea vomiting or diarrhea.   HOSPITAL COURSE:   * Generalized weakness Likely multifactorial with dehydration and possible urinary tract infection. She does have chronic generalized weakness and fatigue. SNF for PT  * Urinary tract infection Started Levaquin. There is some concern regarding infiltrate versus atelectasis at the left base. Levaquin should also cover for possible pneumonia although seems unlikely.  Cx will likely be negative due to being on abx.   * Dehydration Resolved  * Uncontrolled hypertension - Improving Continue home medications. Increased dose of clonidine. HTN improved.  * COPD Stable  * CKD stage III Stable  *  Chronic back pain Continue fentanyl patch.  * Recent cellulitis Currently her legs looks fine, as she finished her course of doxycycline yesterday.  * Acute sinusitis On abx for UTI. Start flonase and claritin  Stable for discharge to SNF   CONSULTS OBTAINED:     DRUG ALLERGIES:   Allergies  Allergen Reactions  . Enalapril Anaphylaxis  . Tramadol Shortness Of Breath  . Capsaicin Other (See Comments)    Reaction:  Unknown   . Codeine Other  (See Comments)    Reaction:  Unknown   . Hydrocodone Other (See Comments)    Reaction:  Unknown   . Oxycodone Other (See Comments)    Reaction:  Unknown   . Vicodin [Hydrocodone-Acetaminophen] Other (See Comments)    Reaction:  Unknown     DISCHARGE MEDICATIONS:   Current Discharge Medication List    START taking these medications   Details  fluticasone (FLONASE) 50 MCG/ACT nasal spray Place 2 sprays into both nostrils daily. Refills: 2    levofloxacin (LEVAQUIN) 250 MG tablet Take 1 tablet (250 mg total) by mouth every other day. Qty: 4 tablet, Refills: 0    lidocaine (LIDODERM) 5 % Place 1 patch onto the skin daily. Remove & Discard patch within 12 hours or as directed by MD Qty: 30 patch, Refills: 0    loratadine (CLARITIN) 10 MG tablet Take 1 tablet (10 mg total) by mouth daily.      CONTINUE these medications which have CHANGED   Details  cloNIDine (CATAPRES) 0.3 MG tablet Take 1 tablet (0.3 mg total) by mouth 3 (three) times daily.    fentaNYL (DURAGESIC - DOSED MCG/HR) 12 MCG/HR Place 1 patch (12.5 mcg total) onto the skin every 3 (three) days. Qty: 5 patch, Refills: 0      CONTINUE these medications which have NOT CHANGED   Details  acetaminophen (TYLENOL) 325 MG tablet Take 650 mg by mouth every 4 (four) hours as needed for mild pain, fever or headache.     carvedilol (COREG) 6.25 MG tablet Take 6.25 mg by mouth 2 (two) times daily.    diltiazem (CARDIZEM CD) 240 MG 24 hr capsule Take 240 mg by mouth daily.     furosemide (LASIX) 40 MG tablet Take 40 mg by mouth daily.    gabapentin (NEURONTIN) 100 MG capsule Take 100 mg by mouth 3 (three) times daily.    hydrALAZINE (APRESOLINE) 50 MG tablet Take 50 mg by mouth 3 (three) times daily.    hydrochlorothiazide (HYDRODIURIL) 25 MG tablet Take 25 mg by mouth daily.    metaxalone (SKELAXIN) 800 MG tablet Take 800 mg by mouth every 8 (eight) hours as needed for muscle spasms.     multivitamin (RENA-VIT) TABS  tablet Take 1 tablet by mouth daily.    naproxen (NAPROSYN) 500 MG tablet Take 500 mg by mouth 2 (two) times daily.    pantoprazole (PROTONIX) 40 MG tablet Take 40 mg by mouth 2 (two) times daily.    phenytoin (DILANTIN) 100 MG ER capsule Take 200 mg by mouth at bedtime.     predniSONE (DELTASONE) 10 MG tablet Take 10 mg by mouth daily.    doxycycline (VIBRA-TABS) 100 MG tablet Take 1 tablet (100 mg total) by mouth 2 (two) times daily. Qty: 20 tablet, Refills: 0      STOP taking these medications     fentaNYL (DURAGESIC - DOSED MCG/HR) 50 MCG/HR          Today    VITAL  SIGNS:  Blood pressure 176/53, pulse 67, temperature 97.6 F (36.4 C), temperature source Oral, resp. rate 18, height 5\' 4"  (1.626 m), weight 94.257 kg (207 lb 12.8 oz), SpO2 96 %.  I/O:   Intake/Output Summary (Last 24 hours) at 03/18/15 0927 Last data filed at 03/17/15 1808  Gross per 24 hour  Intake    480 ml  Output      0 ml  Net    480 ml    PHYSICAL EXAMINATION:  Physical Exam  GENERAL:  81 y.o.-year-old patient lying in the bed with no acute distress.  LUNGS: Normal breath sounds bilaterally, no wheezing, rales,rhonchi or crepitation. No use of accessory muscles of respiration.  CARDIOVASCULAR: S1, S2 normal. No murmurs, rubs, or gallops.  ABDOMEN: Soft, non-tender, non-distended. Bowel sounds present. No organomegaly or mass.  NEUROLOGIC: Moves all 4 extremities. PSYCHIATRIC: The patient is alert and oriented x 3.  SKIN: No obvious rash, lesion, or ulcer.  Frontal sinus tenderness DATA REVIEW:   CBC  Recent Labs Lab 03/17/15 0434  WBC 8.2  HGB 8.1*  HCT 25.0*  PLT 207    Chemistries   Recent Labs Lab 03/15/15 0944  03/17/15 0434  NA 137  < > 139  K 3.6  < > 4.0  CL 101  < > 103  CO2 25  < > 26  GLUCOSE 92  < > 99  BUN 46*  < > 49*  CREATININE 2.66*  < > 2.63*  CALCIUM 9.3  < > 9.0  AST 17  --   --   ALT 11*  --   --   ALKPHOS 44  --   --   BILITOT 0.6  --   --    < > = values in this interval not displayed.  Cardiac Enzymes  Recent Labs Lab 03/15/15 0944  TROPONINI 0.03    Microbiology Results  Results for orders placed or performed during the hospital encounter of 01/07/15  C difficile quick scan w PCR reflex ([facility] only)     Status: None   Collection Time: 01/06/15  6:45 PM  Result Value Ref Range Status   C Diff antigen NEGATIVE  Final   C Diff toxin NEGATIVE  Final   C Diff interpretation Negative for C. difficile  Final    RADIOLOGY:  Dg Chest 2 View  03/16/2015   CLINICAL DATA:  81 year old female with progressive weakness over the past month. Past medical history is extensive and includes chronic respiratory failure, COPD, CHF and anemia  EXAM: CHEST  2 VIEW  COMPARISON:  Prior chest x-ray 03/15/2015  FINDINGS: Stable cardiomegaly. Mediastinal contours remain unchanged given patient's leftward rotated position. Atherosclerotic calcification again present in the transverse aorta. No evidence of pulmonary edema or new airspace consolidation on today's evaluation. Persistent moderate left pleural effusion with associated left lower lobe atelectasis. No acute osseous abnormality.  IMPRESSION: 1. Stable chest x-ray without evidence of acute cardiopulmonary process. 2. Unchanged moderate volume left layering pleural effusion with associated left lower lobe atelectasis.   Electronically Signed   By: Malachy Moan M.D.   On: 03/16/2015 13:10      Follow up with PCP in 1 week.  Management plans discussed with the patient, family and they are in agreement.  CODE STATUS:     Code Status Orders        Start     Ordered   03/15/15 2357  Full code   Continuous     03/15/15  2356    Advance Directive Documentation        Most Recent Value   Type of Advance Directive  Living will   Pre-existing out of facility DNR order (yellow form or pink MOST form)     "MOST" Form in Place?        TOTAL TIME TAKING CARE OF THIS PATIENT ON DAY  OF DISCHARGE: more than 30 minutes.    Milagros Loll R M.D on 03/18/2015 at 9:27 AM  Between 7am to 6pm - Pager - [phone]  After 6pm go to [email or web address] - password [email or web address] [facility]  [place] [place] [facility]  Office  [phone]  CC: Primary care physician; Jaclyn Shaggy, MD

## 2015-03-18 NOTE — Progress Notes (Signed)
Physical Therapy Treatment Patient Details Name: Kristina Wilson MRN: [id number] DOB: 25-Sep-1922 Today's Date: 03/18/2015    History of Present Illness Kristina Wilson is a 81 y.o. female with a known history of COPD, sleep apnea, CHF, chronic kidney disease, gastroesophageal reflux disease, chronic anemia, chronic back pain, episode of TIA in the past- has been gradually getting weak for last 1 month, she lives with daughter, and as per her she was able to walk with a walker in the house to go to the bathroom and move around. She has been having this same functional status for last 1-1/2 month, but for the last 2 days daughter feels something is wrong. reports suddenly having more LBP, and her RLE feels "odd"    PT Comments    Pt demonstrating significant SOB with minimal activity and after a few steps of ambulation, pt leaned over onto RW (appearing SOB) and pt's B knees buckled requiring increased assist to prevent fall/stay upright and additional staff assisted to safely sit pt onto edge of bed and lay pt down into bed (pt c/o difficulty getting her breathe); nursing present end of session and aware and notified of above.  Pt does not appear safe to discharge home:  Nursing and care manager notified (nursing reported she would notify MD).  Continue to recommend pt discharge to STR.  Follow Up Recommendations  SNF     Equipment Recommendations  Rolling walker with 5" wheels    Recommendations for Other Services       Precautions / Restrictions Precautions Precautions: Fall Restrictions Weight Bearing Restrictions: No    Mobility  Bed Mobility Overal bed mobility: Needs Assistance Bed Mobility: Supine to Sit;Sit to Supine     Supine to sit: Mod assist;HOB elevated (assist for trunk) Sit to supine: +2 for physical assistance;Max assist (d/t pt SOB and requiring increased assist d/t breathing difficulties)      Transfers Overall transfer level: Needs  assistance Equipment used: Rolling walker (2 wheeled) Transfers: Sit to/from Stand Sit to Stand:  (min assist to stand from bed with RW; 3 assist to sit safely onto edge of bed (d/t pt's knees buckling and pt significantly SOB))            Ambulation/Gait Ambulation/Gait assistance: +2 physical assistance (first few steps pt CGA but then pt leaned onto RW via arms (pt appearing SOB) and then B knees buckling requiring mod to max assist to prevent fall/remain upright; staff quickly came to assist and pt safely assisted onto bed via 3 assist) Ambulation Distance (Feet):  (2 feet) Assistive device: Rolling walker (2 wheeled)     Gait velocity interpretation: <1.8 ft/sec, indicative of risk for recurrent falls General Gait Details: decreased B step length/foot clearance/heelstrike; quickly SOB; B knees buckling requiring increased assist    Stairs            Wheelchair Mobility    Modified Rankin (Stroke Patients Only)       Balance Overall balance assessment: Needs assistance Sitting-balance support: No upper extremity supported;Feet supported Sitting balance-Leahy Scale: Fair   Postural control: Posterior lean Standing balance support: Bilateral upper extremity supported Standing balance-Leahy Scale: Fair (with B UE's on RW)                      Cognition Arousal/Alertness: Awake/alert Behavior During Therapy: WFL for tasks assessed/performed Overall Cognitive Status: Within Functional Limits for tasks assessed  Exercises      General Comments   Nursing called physical therapist to see pt prior to discharge home.  Pt agreeable to PT session.  Pt's daughter present for entire session.       Pertinent Vitals/Pain Pain Assessment: Faces (Pt did not verbally rate when asked) Faces Pain Scale: Hurts even more (with functional mobility) Pain Location: low back Pain Intervention(s): Limited activity within patient's  tolerance;Monitored during session;Repositioned  See 1435 vitals in flow sheet.    Home Living                      Prior Function            PT Goals (current goals can now be found in the care plan section) Acute Rehab PT Goals Patient Stated Goal: get stronger, get out of bed PT Goal Formulation: With patient Time For Goal Achievement: 03/30/15 Potential to Achieve Goals: Fair Progress towards PT goals: Progressing toward goals    Frequency  Min 2X/week    PT Plan Current plan remains appropriate    Co-evaluation             End of Session Equipment Utilized During Treatment: Gait belt Activity Tolerance: Patient limited by fatigue;Other (comment) (and SOB) Patient left: in bed;with nursing/sitter in room (nursing attending to pt)     Time: 1420-1440 PT Time Calculation (min) (ACUTE ONLY): 20 min  Charges:  $Therapeutic Activity: 8-22 mins                    G CodesHendricks Limes 2015-04-09, 4:18 PM Hendricks Limes, PT [phone]

## 2015-03-18 NOTE — Discharge Instructions (Signed)
°  DIET:  Cardiac diet  DISCHARGE CONDITION:  Stable  ACTIVITY:  Activity as tolerated  OXYGEN:  Home Oxygen: No.   Oxygen Delivery: room air  DISCHARGE LOCATION:  nursing home   If you experience worsening of your admission symptoms, develop shortness of breath, life threatening emergency, suicidal or homicidal thoughts you must seek medical attention immediately by calling 911 or calling your MD immediately  if symptoms less severe.  You Must read complete instructions/literature along with all the possible adverse reactions/side effects for all the Medicines you take and that have been prescribed to you. Take any new Medicines after you have completely understood and accpet all the possible adverse reactions/side effects.   Please note  You were cared for by a hospitalist during your hospital stay. If you have any questions about your discharge medications or the care you received while you were in the hospital after you are discharged, you can call the unit and asked to speak with the hospitalist on call if the hospitalist that took care of you is not available. Once you are discharged, your primary care physician will handle any further medical issues. Please note that NO REFILLS for any discharge medications will be authorized once you are discharged, as it is imperative that you return to your primary care physician (or establish a relationship with a primary care physician if you do not have one) for your aftercare needs so that they can reassess your need for medications and monitor your lab values.  CPAP AT NIGHT

## 2015-03-18 NOTE — Plan of Care (Signed)
Problem: Discharge Progression Outcomes Goal: Other Discharge Outcomes/Goals Plan of care progress to goal - Complained of pain, tylenol given with improvement. - assist to [facility]. - Uses cpap at night. - will continue to monitor.

## 2015-03-18 NOTE — Clinical Social Work Note (Signed)
CSW was updated by [facility] and Lead CSW. Pt will discharge home today with home health services, as pt does not have a 3-night inpatient qualifying stay required by Medicare to go to a SNF for STR. RNCM is following for discharge planning needs. CSW is signing off as no further needs identified.   Dede Query, MSW, LCSW Clinical Social Worker  [phone]

## 2015-04-01 ENCOUNTER — Encounter
Admission: RE | Admit: 2015-04-01 | Discharge: 2015-04-01 | Disposition: A | Discharge status: Home / Self Care | Payer: Medicare Other | Source: Ambulatory Visit | Attending: Vascular Surgery | Admitting: Vascular Surgery

## 2015-04-01 DIAGNOSIS — Z01818 Encounter for other preprocedural examination: Secondary | ICD-10-CM | POA: Diagnosis not present

## 2015-04-01 HISTORY — DX: Unspecified osteoarthritis, unspecified site: M19.90

## 2015-04-01 HISTORY — DX: Adverse effect of unspecified anesthetic, initial encounter: T41.45XA

## 2015-04-01 HISTORY — DX: Cerebral infarction, unspecified: I63.9

## 2015-04-01 HISTORY — DX: Other complications of anesthesia, initial encounter: T88.59XA

## 2015-04-01 HISTORY — DX: Headache, unspecified: R51.9

## 2015-04-01 HISTORY — DX: Essential (primary) hypertension: I10

## 2015-04-01 HISTORY — DX: Headache: R51

## 2015-04-01 LAB — BASIC METABOLIC PANEL
ANION GAP: 13 (ref 5–15)
BUN: 49 mg/dL — ABNORMAL HIGH (ref 6–20)
CALCIUM: 9.7 mg/dL (ref 8.9–10.3)
CO2: 26 mmol/L (ref 22–32)
Chloride: 96 mmol/L — ABNORMAL LOW (ref 101–111)
Creatinine, Ser: 2.44 mg/dL — ABNORMAL HIGH (ref 0.44–1.00)
GFR, EST AFRICAN AMERICAN: 19 mL/min — AB (ref >60)
GFR, EST NON AFRICAN AMERICAN: 16 mL/min — AB (ref >60)
GLUCOSE: 141 mg/dL — AB (ref 65–99)
Potassium: 4 mmol/L (ref 3.5–5.1)
SODIUM: 135 mmol/L (ref 135–145)

## 2015-04-01 LAB — APTT: APTT: 29 s (ref 24–36)

## 2015-04-01 LAB — CBC
HCT: 27.1 % — ABNORMAL LOW (ref 35.0–47.0)
Hemoglobin: 8.9 g/dL — ABNORMAL LOW (ref 12.0–16.0)
MCH: 32.6 pg (ref 26.0–34.0)
MCHC: 32.9 g/dL (ref 32.0–36.0)
MCV: 99.2 fL (ref 80.0–100.0)
PLATELETS: 328 10*3/uL (ref 150–440)
RBC: 2.73 MIL/uL — AB (ref 3.80–5.20)
RDW: 14 % (ref 11.5–14.5)
WBC: 14.5 10*3/uL — AB (ref 3.6–11.0)

## 2015-04-01 LAB — PROTIME-INR
INR: 0.94
PROTHROMBIN TIME: 12.8 s (ref 11.4–15.0)

## 2015-04-01 NOTE — Patient Instructions (Signed)
  Your procedure is scheduled on: April 12, 2015 (Friday) Report to [facility]). To find out your arrival time please call [phone] between 1PM - 3PM on April 11, 2015 (Thursday).  Remember: Instructions that are not followed completely may result in serious medical risk, up to and including death, or upon the discretion of your surgeon and anesthesiologist your surgery may need to be rescheduled.    __x__ 1. Do not eat food or drink liquids after midnight. No gum chewing or hard candies.     ____ 2. No Alcohol for 24 hours before or after surgery.   ____ 3. Bring all medications with you on the day of surgery if instructed.    __x__ 4. Notify your doctor if there is any change in your medical condition     (cold, fever, infections).     Do not wear jewelry, make-up, hairpins, clips or nail polish.  Do not wear lotions, powders, or perfumes. You may wear deodorant.  Do not shave 48 hours prior to surgery. Men may shave face and neck.  Do not bring valuables to the hospital.    [facility] is not responsible for any belongings or valuables.               Contacts, dentures or bridgework may not be worn into surgery.  Leave your suitcase in the car. After surgery it may be brought to your room.  For patients admitted to the hospital, discharge time is determined by your                treatment team.   Patients discharged the day of surgery will not be allowed to drive home.   Please read over the following fact sheets that you were given:   MRSA Information and Surgical Site Infection Prevention   __x__ Take these medicines the morning of surgery with A SIP OF WATER:    1.Carvedilol  2. Clonidine  3. Cardizem  4.Gabapentin  5.Hydralazine  6.Prednisone  ____ Fleet Enema (as directed)   __x__ Use CHG Soap as directed  ____ Use inhalers on the day of surgery  ____ Stop metformin 2 days prior to surgery    ____ Take 1/2 of usual insulin dose  the night before surgery and none on the morning of surgery.   ____ Stop Coumadin/Plavix/aspirin on  ____ Stop Anti-inflammatories on    _x___ Stop supplements until after surgery.  (Stop Vitamin B, and PreserVision )  __x__ Bring C-Pap to the hospital.

## 2015-04-02 LAB — SURGICAL PCR SCREEN
MRSA, PCR: POSITIVE — AB
Staphylococcus aureus: POSITIVE — AB

## 2015-04-02 NOTE — OR Nursing (Signed)
FAXED POSITIVE STAPH RESULTS TO [facility]

## 2015-04-12 ENCOUNTER — Ambulatory Visit
Admission: RE | Admit: 2015-04-12 | Discharge: 2015-04-12 | Disposition: A | Discharge status: Home / Self Care | Payer: Medicare Other | Source: Ambulatory Visit | Attending: Vascular Surgery | Admitting: Vascular Surgery

## 2015-04-12 ENCOUNTER — Ambulatory Visit: Payer: Medicare Other | Admitting: Anesthesiology

## 2015-04-12 ENCOUNTER — Encounter
Admission: RE | Disposition: A | Discharge status: Home / Self Care | Payer: Self-pay | Source: Ambulatory Visit | Attending: Vascular Surgery

## 2015-04-12 ENCOUNTER — Encounter: Payer: Self-pay | Admitting: *Deleted

## 2015-04-12 DIAGNOSIS — E1122 Type 2 diabetes mellitus with diabetic chronic kidney disease: Secondary | ICD-10-CM | POA: Insufficient documentation

## 2015-04-12 DIAGNOSIS — Z992 Dependence on renal dialysis: Secondary | ICD-10-CM | POA: Diagnosis not present

## 2015-04-12 DIAGNOSIS — F17210 Nicotine dependence, cigarettes, uncomplicated: Secondary | ICD-10-CM | POA: Insufficient documentation

## 2015-04-12 DIAGNOSIS — Z888 Allergy status to other drugs, medicaments and biological substances status: Secondary | ICD-10-CM | POA: Diagnosis not present

## 2015-04-12 DIAGNOSIS — I120 Hypertensive chronic kidney disease with stage 5 chronic kidney disease or end stage renal disease: Secondary | ICD-10-CM | POA: Diagnosis not present

## 2015-04-12 DIAGNOSIS — N186 End stage renal disease: Secondary | ICD-10-CM | POA: Insufficient documentation

## 2015-04-12 DIAGNOSIS — M1990 Unspecified osteoarthritis, unspecified site: Secondary | ICD-10-CM | POA: Diagnosis not present

## 2015-04-12 DIAGNOSIS — M7989 Other specified soft tissue disorders: Secondary | ICD-10-CM | POA: Insufficient documentation

## 2015-04-12 DIAGNOSIS — K219 Gastro-esophageal reflux disease without esophagitis: Secondary | ICD-10-CM | POA: Diagnosis not present

## 2015-04-12 DIAGNOSIS — I509 Heart failure, unspecified: Secondary | ICD-10-CM | POA: Insufficient documentation

## 2015-04-12 DIAGNOSIS — R569 Unspecified convulsions: Secondary | ICD-10-CM | POA: Diagnosis not present

## 2015-04-12 DIAGNOSIS — D649 Anemia, unspecified: Secondary | ICD-10-CM | POA: Diagnosis not present

## 2015-04-12 DIAGNOSIS — Z885 Allergy status to narcotic agent status: Secondary | ICD-10-CM | POA: Insufficient documentation

## 2015-04-12 DIAGNOSIS — G4730 Sleep apnea, unspecified: Secondary | ICD-10-CM | POA: Insufficient documentation

## 2015-04-12 HISTORY — PX: CAPD INSERTION: SHX5233

## 2015-04-12 SURGERY — LAPAROSCOPIC INSERTION CONTINUOUS AMBULATORY PERITONEAL DIALYSIS  (CAPD) CATHETER
Anesthesia: General | Wound class: Clean Contaminated

## 2015-04-12 MED ORDER — BUPIVACAINE-EPINEPHRINE (PF) 0.25% -1:200000 IJ SOLN
INTRAMUSCULAR | Status: AC
  Filled 2015-04-12: qty 30

## 2015-04-12 MED ORDER — ONDANSETRON HCL 4 MG/2ML IJ SOLN: 4.0000 mg | Freq: Once | INTRAMUSCULAR | Status: DC | PRN

## 2015-04-12 MED ORDER — FENTANYL CITRATE (PF) 100 MCG/2ML IJ SOLN
INTRAMUSCULAR | Status: DC | PRN
  Administered 2015-04-12: 25 ug via INTRAVENOUS
  Administered 2015-04-12 (×2): 50 ug via INTRAVENOUS

## 2015-04-12 MED ORDER — FENTANYL CITRATE (PF) 100 MCG/2ML IJ SOLN
INTRAMUSCULAR | Status: AC
  Administered 2015-04-12: 25 ug via INTRAVENOUS
  Filled 2015-04-12: qty 2

## 2015-04-12 MED ORDER — ROCURONIUM BROMIDE 100 MG/10ML IV SOLN
INTRAVENOUS | Status: DC | PRN
  Administered 2015-04-12: 30 mg via INTRAVENOUS
  Administered 2015-04-12: 10 mg via INTRAVENOUS

## 2015-04-12 MED ORDER — LIDOCAINE HCL (CARDIAC) 20 MG/ML IV SOLN
INTRAVENOUS | Status: DC | PRN
  Administered 2015-04-12: 60 mg via INTRAVENOUS
  Administered 2015-04-12: 40 mg via INTRAVENOUS

## 2015-04-12 MED ORDER — VANCOMYCIN HCL 1000 MG IV SOLR
1000.0000 mg | Freq: Once | INTRAVENOUS | Status: AC
  Administered 2015-04-12: 1000 mg via INTRAVENOUS
  Filled 2015-04-12: qty 1000

## 2015-04-12 MED ORDER — GLYCOPYRROLATE 0.2 MG/ML IJ SOLN
INTRAMUSCULAR | Status: DC | PRN
  Administered 2015-04-12: 0.6 mg via INTRAVENOUS

## 2015-04-12 MED ORDER — CEFAZOLIN SODIUM 1-5 GM-% IV SOLN
INTRAVENOUS | Status: AC
  Filled 2015-04-12: qty 50

## 2015-04-12 MED ORDER — FAMOTIDINE 20 MG PO TABS
ORAL_TABLET | ORAL | Status: AC
  Filled 2015-04-12: qty 1

## 2015-04-12 MED ORDER — CLINDAMYCIN PHOSPHATE 300 MG/50ML IV SOLN
INTRAVENOUS | Status: AC
  Filled 2015-04-12: qty 50

## 2015-04-12 MED ORDER — ONDANSETRON HCL 4 MG/2ML IJ SOLN
INTRAMUSCULAR | Status: DC | PRN
  Administered 2015-04-12: 4 mg via INTRAVENOUS

## 2015-04-12 MED ORDER — NEOSTIGMINE METHYLSULFATE 10 MG/10ML IV SOLN
INTRAVENOUS | Status: DC | PRN
  Administered 2015-04-12: 3 mg via INTRAVENOUS

## 2015-04-12 MED ORDER — SILVER SULFADIAZINE 1 % EX CREA
TOPICAL_CREAM | CUTANEOUS | Status: AC
  Filled 2015-04-12: qty 85

## 2015-04-12 MED ORDER — SODIUM CHLORIDE 0.9 % IV SOLN
INTRAVENOUS | Status: DC
  Administered 2015-04-12: 10:00:00 via INTRAVENOUS

## 2015-04-12 MED ORDER — FENTANYL CITRATE (PF) 100 MCG/2ML IJ SOLN
25.0000 ug | INTRAMUSCULAR | Status: DC | PRN
  Administered 2015-04-12 (×2): 25 ug via INTRAVENOUS

## 2015-04-12 MED ORDER — PHENYLEPHRINE HCL 10 MG/ML IJ SOLN
INTRAMUSCULAR | Status: DC | PRN
  Administered 2015-04-12: 100 ug via INTRAVENOUS

## 2015-04-12 MED ORDER — HYDROMORPHONE HCL 1 MG/ML IJ SOLN: 0.5000 mg | Freq: Once | INTRAMUSCULAR | Status: DC

## 2015-04-12 MED ORDER — PROPOFOL 10 MG/ML IV BOLUS
INTRAVENOUS | Status: DC | PRN
  Administered 2015-04-12: 50 mg via INTRAVENOUS
  Administered 2015-04-12: 100 mg via INTRAVENOUS

## 2015-04-12 MED ORDER — EPHEDRINE SULFATE 50 MG/ML IJ SOLN
INTRAMUSCULAR | Status: DC | PRN
  Administered 2015-04-12 (×2): 5 mg via INTRAVENOUS
  Administered 2015-04-12: 10 mg via INTRAVENOUS
  Administered 2015-04-12: 5 mg via INTRAVENOUS
  Administered 2015-04-12 (×2): 10 mg via INTRAVENOUS

## 2015-04-12 MED ORDER — DEXAMETHASONE SODIUM PHOSPHATE 4 MG/ML IJ SOLN
INTRAMUSCULAR | Status: DC | PRN
  Administered 2015-04-12: 4 mg via INTRAVENOUS

## 2015-04-12 MED ORDER — CEFAZOLIN SODIUM 1-5 GM-% IV SOLN: 1.0000 g | Freq: Once | INTRAVENOUS | Status: DC

## 2015-04-12 MED ORDER — VANCOMYCIN HCL IN DEXTROSE 1-5 GM/200ML-% IV SOLN
INTRAVENOUS | Status: AC
  Administered 2015-04-12: 1 g
  Filled 2015-04-12: qty 200

## 2015-04-12 SURGICAL SUPPLY — 40 items
ADAPTER CATH DIALYSIS 18.75 (CATHETERS) ×2 IMPLANT
ADAPTER CATH DIALYSIS 18.75CM (CATHETERS) ×1
BLADE SURG 15 STRL LF DISP TIS (BLADE) ×1 IMPLANT
BLADE SURG 15 STRL SS (BLADE) ×3
CANISTER SUCT 1200ML W/VALVE (MISCELLANEOUS) ×3 IMPLANT
CATH DIALYSIS TI ADAPTER (ADAPTER) ×2 IMPLANT
CATH DLYS SWAN NECK 62.5CM (CATHETERS) ×1 IMPLANT
CHLORAPREP W/TINT 26ML (MISCELLANEOUS) ×3 IMPLANT
DIALYSIS CATH PD EXTEND (CATHETERS) ×2 IMPLANT
DIALYSIS CATH PD INTRO KIT (CATHETERS) ×2 IMPLANT
GLOVE SURG SYN 8.0 (GLOVE) ×15 IMPLANT
GLOVE SURG SYN 8.0 PF PI (GLOVE) ×1 IMPLANT
GOWN STRL REUS W/ TWL LRG LVL3 (GOWN DISPOSABLE) ×1 IMPLANT
GOWN STRL REUS W/ TWL XL LVL3 (GOWN DISPOSABLE) ×1 IMPLANT
GOWN STRL REUS W/TWL LRG LVL3 (GOWN DISPOSABLE) ×3
GOWN STRL REUS W/TWL XL LVL3 (GOWN DISPOSABLE) ×3
KIT RM TURNOVER STRD PROC AR (KITS) ×3 IMPLANT
LABEL OR SOLS (LABEL) ×3 IMPLANT
LIQUID BAND (GAUZE/BANDAGES/DRESSINGS) ×3 IMPLANT
MINICAP W/POVIDONE IODINE SOL (MISCELLANEOUS) ×3 IMPLANT
NDL HYPO 25X1 1.5 SAFETY (NEEDLE) ×1 IMPLANT
NEEDLE HYPO 25X1 1.5 SAFETY (NEEDLE) ×3 IMPLANT
NEEDLE INSUFFLATION 150MM (ENDOMECHANICALS) ×3 IMPLANT
NS IRRIG 500ML POUR BTL (IV SOLUTION) ×3 IMPLANT
PACK LAP CHOLECYSTECTOMY (MISCELLANEOUS) ×3 IMPLANT
PAD GROUND ADULT SPLIT (MISCELLANEOUS) ×3 IMPLANT
PENCIL ELECTRO HAND CTR (MISCELLANEOUS) ×3 IMPLANT
SET TRANSFER 6 W/TWIST CLAMP 5 (SET/KITS/TRAYS/PACK) ×3 IMPLANT
SLEEVE ENDOPATH XCEL 5M (ENDOMECHANICALS) IMPLANT
SUT ETHIBOND 0 36 GRN (SUTURE) ×2 IMPLANT
SUT MNCRL AB 4-0 PS2 18 (SUTURE) ×3 IMPLANT
SUT MNCRL+ 5-0 UNDYED PC-3 (SUTURE) IMPLANT
SUT MONOCRYL 5-0 (SUTURE) ×2
SUT VIC AB 3-0 SH 27 (SUTURE) ×6
SUT VIC AB 3-0 SH 27X BRD (SUTURE) ×1 IMPLANT
SUT VICRYL 0 AB UR-6 (SUTURE) ×6 IMPLANT
SYR 50ML LL SCALE MARK (SYRINGE) ×3 IMPLANT
TROCAR XCEL NON-BLD 5MMX100MML (ENDOMECHANICALS) ×3 IMPLANT
TUBING INSUFFLATOR HI FLOW (MISCELLANEOUS) ×3 IMPLANT
flex neck exxtended peritoneal dialysis catheter (Catheter) IMPLANT

## 2015-04-12 NOTE — Op Note (Signed)
  OPERATIVE NOTE   PROCEDURE: 1. Laparoscopic peritoneal dialysis catheter placement.  PRE-OPERATIVE DIAGNOSIS: 1. End-stage renal disease 2. Diabetes mellitus 3. Congestive heart failure  POST-OPERATIVE DIAGNOSIS: Same  SURGEON: Schnier, Latina Craver  ASSISTANT(S): None  ANESTHESIA: general  ESTIMATED BLOOD LOSS: Minimal   FINDING(S): 1. None  SPECIMEN(S): None  INDICATIONS:  Kristina Wilson is a 81 y.o. female who presents with end-stage renal disease she will initiate peritoneal dialysis. The patient has decided to do peritoneal dialysis for his long-term dialysis. Risks and benefits of placement were discussed and he is agreeable to proceed.  Differences between peritoneal dialysis and hemodialysis were discussed.    DESCRIPTION:  After obtaining full informed written consent, the patient was brought back to the operating room and placed supine upon the operating table. The patient received IV antibiotics prior to induction. After obtaining adequate anesthesia, the abdomen was prepped and draped in the standard fashion.   A small vertical incision was made in the midline just above the umbilicus and the dissection was carried down through the soft tissue to expose the fascia. Two 0 Vicryl sutures were then used to secure the fascia just lateral to the midline on both sides and an 15 blade was used to incise the fascia. The fascia is then elevated with a bone hook Varies needle is introduced and a saline test was performed indicating intraperitoneal location and insufflated of the abdomen with carbon dioxide is performed to 15 mmHg pressure. A 5 mm trocar is then placed again maintaining elevation of the fascia with a bone hook.  A oblique incision is then made in the left lower quadrant to the lateral portion of the rectus muscle the dissection is carried down through the soft tissues and the fascia is exposed. Small incision is made in the fascia with the Bovie and a  pursestring suture of 0 Vicryl is placed. Upon initial attempt of placing the Seldinger needle the adhesions are obstructing the catheter positioning and view of the placement and therefore they must be removed.  Seldinger needle was then inserted under direct visualization and the wire introduced under the view of the camera trocar was then placed and the catheter was advanced into the abdominal cavity over the trocar. Under direct visualization the catheters curled portion was positioned deep into the pelvis just to the right of midline. It was irrigated with heparinized saline. The deep cuff was secured to the fascial pursestring suture. A small counterincision was made in the right upper quadrant of the abdomen and the catheter was brought out this site. The appropriate distal connectors were placed, and I then placed 300 cc of saline through the catheter into the pelvis. The abdomen was desufflated. Immediately, 250 cc of effluent returned through the catheter when the bag was placed to gravity. I took one more look with the camera to ensure that the catheter was in the pelvis and it was. The hasson trocar was then removed. I then closed the incisions with a 2-0 Vicryl in the right lower quadrant incision and subsequently 3-0 Vicryl and 4-0 Monocryl, the other incisions were closed with 3-0 Vicryl and 4-0 Monocryl and placed Dermabond as dressing. Dry dressing was placed around the catheter exit site. The patient was then awakened from anesthesia and taken to the recovery room in stable condition having tolerated the procedure well.   COMPLICATIONS: None  CONDITION: None  Kristina Wilson 04/12/2015, 12:33 PM

## 2015-04-12 NOTE — Anesthesia Preprocedure Evaluation (Addendum)
Anesthesia Evaluation  Patient identified by MRN, date of birth, ID band Patient awake    Reviewed: Allergy & Precautions, H&P , NPO status , Patient's Chart, lab work & pertinent test results, reviewed documented beta blocker date and time   History of Anesthesia Complications (+) PROLONGED EMERGENCE and history of anesthetic complications  Airway Mallampati: II  TM Distance: >3 FB Neck ROM: full    Dental   Pulmonary sleep apnea , pneumonia, Current Smoker, former smoker,           Cardiovascular hypertension, +CHF   Rate:Normal     Neuro/Psych  Headaches, Seizures -,  TIACVA    GI/Hepatic GERD  ,  Endo/Other    Renal/GU Renal disease     Musculoskeletal  (+) Arthritis ,   Abdominal   Peds  Hematology  (+) anemia ,   Anesthesia Other Findings April echo yields ef 60%.  No recent change in cardiac or pulm. Baseline.  Chronic LE swelling present.  Reproductive/Obstetrics                            Anesthesia Physical Anesthesia Plan  ASA: IV  Anesthesia Plan: General ETT   Post-op Pain Management:    Induction:   Airway Management Planned:   Additional Equipment:   Intra-op Plan:   Post-operative Plan:   Informed Consent: I have reviewed the patients History and Physical, chart, labs and discussed the procedure including the risks, benefits and alternatives for the proposed anesthesia with the patient or authorized representative who has indicated his/her understanding and acceptance.     Plan Discussed with: CRNA  Anesthesia Plan Comments:         Anesthesia Quick Evaluation

## 2015-04-12 NOTE — Transfer of Care (Addendum)
Immediate Anesthesia Transfer of Care Note  Patient: Kristina Wilson  Procedure(s) Performed: Procedure(s): LAPAROSCOPIC INSERTION CONTINUOUS AMBULATORY PERITONEAL DIALYSIS  (CAPD) CATHETER (N/A)  Patient Location: PACU  Anesthesia Type:General  Level of Consciousness: awake, alert  and oriented  Airway & Oxygen Therapy: Patient Spontanous Breathing and Patient connected to face mask oxygen  Post-op Assessment: Report given to RN and Post -op Vital signs reviewed and stable  Post vital signs: Reviewed and stable  Last Vitals:  Filed Vitals:   04/12/15 1252  BP: 146/52  Pulse: 59  Temp:   Resp: 11  Temp 97.7  Complications: No apparent anesthesia complications

## 2015-04-12 NOTE — Discharge Instructions (Signed)
Peritoneal Dialysis  Dialysis is a procedure that does some of the work healthy kidneys do. Peritoneal dialysis is a kind of dialysis that uses the thin lining of the belly (peritoneum) and a fluid called dialysate to remove wastes, salt, and extra water from the blood. Before beginning peritoneal dialysis you will have surgery to place a thin, plastic tube (catheter) in your belly. The tube will be small, soft, and easy to hide. It will be used to fill your belly with fluid and drain your belly of fluid. HOW DOES IT WORK? At the start of the session your belly will be filled with fluid. Wastes, salt, and extra water in your blood will pass through the thin lining of your belly into the fluid. At the end of the session the fluid will be drained into a bag. Then the belly will be refilled with fluid and the procedure will be repeated. The process of draining fluid from the belly and filling the belly with fluid is called an exchange. The time the fluid stays in your body before you drain it is called a dwell. The dwell varies from person to person. It usually takes from 1.5-3 hours. Peritoneal dialysis is done during the day or at night while you are sleeping. When it is done during the day it is called continuous ambulatory peritoneal dialysis (CAPD). In CAPD, exchanges are done up to 5 times a day. Each exchange takes about 30-40 minutes. You may go about your day normally between exchanges. Peritoneal dialysis that is done at night is called continuous cycling peritoneal dialysis (CCPD). In CCPD, a machine called a cycler performs exchanges while you are sleeping. Sometimes a combination of CAPD and CCPD is needed. PREPARING FOR AN EXCHANGE  Check your blood pressure if told by your doctor.  Warm the fluid bag using a dry heating pad. Leave the fluid in the bag with the cover on while doing this. Do not use a microwave or hot water to warm the fluid bag.  If you are using a machine, make sure it is  set up and programmed correctly.  Keep your tubing free of germs (sterile).  Make sure the tube in your belly and its cap are free of germs.  If you are using a machine, make sure the part that attaches the bag and tubing to the the tube in your belly is free of germs.  Make sure the area around the tube in your belly is free of germs.  Prevent new germs from getting on your tubing.  Wash your hands thoroughly. Use a gel or foam.  Close doors and windows. Turn off any fans. Make sure you are in a space without drafts or air currents. Doing these things reduces the chance of infection.  Wear a mask to cover your nose and mouth. Do this before you wash your hands and touch equipment. Keep the mask on during each exchange.  Examine the fluid bag:  Make sure the bag is the right size. Size information is on the label. Make sure it has the correct liquid.  Gently squeeze the bag to make sure there are no leaks.  Look at the color of the fluid. The fluid should be clear. You should be able to see any writing on the side of the bag clearly through the liquid. Do not use it if it is cloudy.  Check the expiration date. The expiration date is on the label. If it is past the expiration date, throw  the bag away. HOW TO DO AN EXCHANGE These steps show how exchanges are commonly done. The actual way in which an exchange should be done varies from person to person. Always do exchanges exactly the way that your doctor has trained you to. Remember to always wash your hands before touching any tubing. This is very important. It helps prevent infection. CAPD  Hang your fluid bag above your belly on the IV pole.  Place a drain bag below your belly.  Pull out the ring of y-shaped tubing that connects both bags.  Uncap the tube that is connected to the tube in your belly (transfer set). Immediately attach it to the y-shaped tubing of the fluid and drain bags.  Twist open the clamp on the transfer  set. This will cause the fluid in your belly to drain through the tube that goes to the drain bag (drain line). It usually takes about 20 minutes for all of the fluid to drain.  Once the fluid has finished draining, twist lock the clamp of the transfer set. Then clamp the drain line.  Break the seal (frangible) on the tubing that is connected to the fluid bag (fill line). Then unclamp the drain line. Air bubbles will flow into the drain line. Make sure your transfer set is still locked so that no air gets into the belly.  Count to 5. Then clamp the drain line.  Twist the transfer set clamp to open it. Fluid will flow into your belly. This should take about 10 minutes.  When this is done, twist the transfer set clamp to lock it. Then clamp the tubing of the fill line.  Detach the tubing from the tube in your belly. Cap the transfer set right away. Tape the transfer set to your belly as told.  Look at the fluid that has drained. It may look like urine, but it should be clear.  Weigh the drain bag. Write down how much was drained.  Check your blood pressure, temperature, and pulse if it is the first exchange of the day.  Allow the fluid to stay in the belly for as long as told by your doctor. You may go about your day while the fluid is in your belly. If your body cannot go all night without an exchange, a machine may be used to perform exchanges while you sleep. The machine is similar to the one used during CCPD. CCPD  When you are ready for bed, put the fluid bags onto the machine. Put on exactly the number of bags that your doctor said to use.  The machine has a small cartridge (cassette) with tubing that attaches to each fluid bag and the tube that goes to the patient (patient line). Depending on the number of fluid bags you are to use, all the tubes on the cassette may need to be connected to separate fluid bags. If you use fewer bags than there are tubes on the cassette, the extra lines  will need to be clamped.  Insert the cassette with the attached tubing into the front of the machine.  Make sure that the drain line extends to the toilet. The tip of the drain line should not touch the water in the toilet bowl.  Pull the ring on the tubing from the fluid bag on the heating pad of the machine and attach it to the first tube of the cassette. Then break the seal on the tubing of this fluid bag.  Repeat this process  with all the bags you need to use.  Start the machine. This will prepare (prime) the machine and tubing by filling it with fluid and getting rid of all the air in the tubes.  Once all lines are prepared, the machine will tell you to connect yourself.  Remove the pull ring from the patient line with one hand.  Uncap your transfer set and attach it to the patient line.  Twist open the clamp on the transfer set.  Press "GO" on the machine. It will begin the draining and filling process. The machine may do 3-5 exchanges overnight, depending on what your doctor recommended.  In the morning, record all volumes and times shown on the machine.  Press "GO" on the machine. The machine will then tell you to close all clamps.  Close your transfer set twist clamp and then clamp all the tubes that go to the machine.  When told by the machine, disconnect the patient line from the transfer set. Re-cap your transfer set right away.  Tape the transfer set to your belly as told.  Check your blood pressure, temperature, and pulse. The fluid that is in your belly in the morning will stay there during the day. If the body cannot go all day without an exchange, you may need to perform an exchange during the day. Follow the steps under CAPD if an extra exchange is needed.  HOME CARE  Follow diet instructions as told by your doctor.  Always keep the fluid bags and other supplies in a cool, clean, and dry place.  Keep a strict schedule. Dialysis must be done every day. Do not  skip a day or an exchange. Make sure to make time for each exchange.  Weigh yourself every day. Sudden weight gain may be a sign of a problem.  Take medicines as told by your doctor.  Avoid becoming constipated. Constipation prevents fluid from draining well. To prevent constipation:  Make sure you eat fiber-rich foods.  Avoid foods that cause constipation.  Increase your physical activity.  Go to the restroom when you feel you need to instead of holding it in.  Take drugs such as laxatives if told by your doctor. Only take them as told. GET HELP IF:  You have a fever or chills.  You feel sick to your stomach (nauseous) or throw up (vomit).  You have diarrhea.  You have any problems with an exchange.  Your blood pressure increases.  You suddenly gain weight or feel short of breath.  The tube in your belly seems loose or feels like it is coming out.  The fluid that has drained from your belly is pinkish or reddish. Women having their menstrual period do not need to get help if the fluid is only a little pink or red.  There are white strands in the fluid that are large enough to get stuck in your tubing. GET HELP RIGHT AWAY IF:  The area around the tube in your belly swells or becomes red, tender, or painful.  There is pus coming from the area around the tube in your belly.  The fluid that has drained from your belly is cloudy.  You have belly pain or discomfort. Document Released: 08/22/2010 Document Revised: 03/22/2013 Document Reviewed: 01/16/2013 [facility] Patient Information 2015 [place], [place]. This information is not intended to replace advice given to you by your health care provider. Make sure you discuss any questions you have with your health care provider.  General Anesthesia, Care  After Refer to this sheet in the next few weeks. These instructions provide you with information on caring for yourself after your procedure. Your health care provider may also  give you more specific instructions. Your treatment has been planned according to current medical practices, but problems sometimes occur. Call your health care provider if you have any problems or questions after your procedure. WHAT TO EXPECT AFTER THE PROCEDURE After the procedure, it is typical to experience:  Sleepiness.  Nausea and vomiting. HOME CARE INSTRUCTIONS  For the first 24 hours after general anesthesia:  Have a responsible person with you.  Do not drive a car. If you are alone, do not take public transportation.  Do not drink alcohol.  Do not take medicine that has not been prescribed by your health care provider.  Do not sign important papers or make important decisions.  You may resume a normal diet and activities as directed by your health care provider.  Change bandages (dressings) as directed.  If you have questions or problems that seem related to general anesthesia, call the hospital and ask for the anesthetist or anesthesiologist on call. SEEK MEDICAL CARE IF:  You have nausea and vomiting that continue the day after anesthesia.  You develop a rash. SEEK IMMEDIATE MEDICAL CARE IF:   You have difficulty breathing.  You have chest pain.  You have any allergic problems. Document Released: 10/26/2000 Document Revised: 07/25/2013 Document Reviewed: 02/02/2013 [facility] Patient Information 2015 [place], [place]. This information is not intended to replace advice given to you by your health care provider. Make sure you discuss any questions you have with your health care provider.

## 2015-04-12 NOTE — H&P (Signed)
[facility] History & Physical Update  The patient was interviewed and re-examined.  The patient's previous History and Physical has been reviewed and is unchanged.  There is no change in the plan of care. We plan to proceed with the scheduled procedure.  Keishawn Rajewski, Latina Craver, MD  04/12/2015, 10:53 AM

## 2015-04-14 NOTE — Anesthesia Postprocedure Evaluation (Signed)
  Anesthesia Post-op Note  Patient: Kristina Wilson  Procedure(s) Performed: Procedure(s): LAPAROSCOPIC INSERTION CONTINUOUS AMBULATORY PERITONEAL DIALYSIS  (CAPD) CATHETER (N/A)  Anesthesia type:General ETT  Patient location: PACU  Post pain: Pain level controlled  Post assessment: Post-op Vital signs reviewed, Patient's Cardiovascular Status Stable, Respiratory Function Stable, Patent Airway and No signs of Nausea or vomiting  Post vital signs: Reviewed and stable  Last Vitals:  Filed Vitals:   04/12/15 1458  BP: 187/40  Pulse: 65  Temp:   Resp: 24    Level of consciousness: awake, alert  and patient cooperative  Complications: No apparent anesthesia complications

## 2015-04-24 ENCOUNTER — Telehealth: Payer: Self-pay | Admitting: *Deleted

## 2015-04-25 NOTE — Telephone Encounter (Signed)
Spoke to daughter and she states she understand the risks of pt could have another TIA/stroke.  Her nephrologist suggested it.  Pt is in process of getting a peritoneal catheter for dialysis but not got there yet and nephrologist knows that he will take ovvr at the point of dialysis for pt.  Daughter states that she is not getting around and she will talkto her mom about low dose weekly procrit and will have to sign a form that will state that this is a potential side effect being on the drug.  She will call me back if she is interested and also she did not have any appt for labs and I told her I would look into it.

## 2015-04-30 NOTE — Addendum Note (Signed)
Addendum  created 04/30/15 0810 by Yevette Edwards, MD   Modules edited: Anesthesia Events, Narrator   Narrator:  Narrator: Event Log Edited

## 2015-05-07 ENCOUNTER — Encounter: Payer: Self-pay | Admitting: Emergency Medicine

## 2015-05-07 ENCOUNTER — Inpatient Hospital Stay
Admission: EM | Admit: 2015-05-07 | Discharge: 2015-05-22 | DRG: 291 | Disposition: A | Discharge status: Skilled Nursing Facility | Payer: Medicare Other | Attending: Internal Medicine | Admitting: Internal Medicine

## 2015-05-07 DIAGNOSIS — L8990 Pressure ulcer of unspecified site, unspecified stage: Secondary | ICD-10-CM | POA: Insufficient documentation

## 2015-05-07 DIAGNOSIS — M549 Dorsalgia, unspecified: Secondary | ICD-10-CM | POA: Diagnosis present

## 2015-05-07 DIAGNOSIS — R7989 Other specified abnormal findings of blood chemistry: Secondary | ICD-10-CM | POA: Diagnosis not present

## 2015-05-07 DIAGNOSIS — R609 Edema, unspecified: Secondary | ICD-10-CM

## 2015-05-07 DIAGNOSIS — N184 Chronic kidney disease, stage 4 (severe): Secondary | ICD-10-CM | POA: Diagnosis present

## 2015-05-07 DIAGNOSIS — I10 Essential (primary) hypertension: Secondary | ICD-10-CM | POA: Diagnosis present

## 2015-05-07 DIAGNOSIS — Z7982 Long term (current) use of aspirin: Secondary | ICD-10-CM

## 2015-05-07 DIAGNOSIS — Z823 Family history of stroke: Secondary | ICD-10-CM

## 2015-05-07 DIAGNOSIS — R778 Other specified abnormalities of plasma proteins: Secondary | ICD-10-CM

## 2015-05-07 DIAGNOSIS — N186 End stage renal disease: Secondary | ICD-10-CM

## 2015-05-07 DIAGNOSIS — E872 Acidosis: Secondary | ICD-10-CM | POA: Diagnosis present

## 2015-05-07 DIAGNOSIS — R809 Proteinuria, unspecified: Secondary | ICD-10-CM | POA: Diagnosis present

## 2015-05-07 DIAGNOSIS — Z888 Allergy status to other drugs, medicaments and biological substances status: Secondary | ICD-10-CM

## 2015-05-07 DIAGNOSIS — I132 Hypertensive heart and chronic kidney disease with heart failure and with stage 5 chronic kidney disease, or end stage renal disease: Principal | ICD-10-CM | POA: Diagnosis present

## 2015-05-07 DIAGNOSIS — N2581 Secondary hyperparathyroidism of renal origin: Secondary | ICD-10-CM | POA: Diagnosis present

## 2015-05-07 DIAGNOSIS — Z992 Dependence on renal dialysis: Secondary | ICD-10-CM

## 2015-05-07 DIAGNOSIS — I5032 Chronic diastolic (congestive) heart failure: Secondary | ICD-10-CM | POA: Diagnosis present

## 2015-05-07 DIAGNOSIS — M5430 Sciatica, unspecified side: Secondary | ICD-10-CM | POA: Diagnosis present

## 2015-05-07 DIAGNOSIS — Z9049 Acquired absence of other specified parts of digestive tract: Secondary | ICD-10-CM

## 2015-05-07 DIAGNOSIS — Z87891 Personal history of nicotine dependence: Secondary | ICD-10-CM

## 2015-05-07 DIAGNOSIS — I2510 Atherosclerotic heart disease of native coronary artery without angina pectoris: Secondary | ICD-10-CM | POA: Diagnosis present

## 2015-05-07 DIAGNOSIS — K219 Gastro-esophageal reflux disease without esophagitis: Secondary | ICD-10-CM | POA: Diagnosis present

## 2015-05-07 DIAGNOSIS — R52 Pain, unspecified: Secondary | ICD-10-CM

## 2015-05-07 DIAGNOSIS — G8929 Other chronic pain: Secondary | ICD-10-CM | POA: Diagnosis present

## 2015-05-07 DIAGNOSIS — Z7401 Bed confinement status: Secondary | ICD-10-CM

## 2015-05-07 DIAGNOSIS — G40909 Epilepsy, unspecified, not intractable, without status epilepticus: Secondary | ICD-10-CM

## 2015-05-07 DIAGNOSIS — M25551 Pain in right hip: Secondary | ICD-10-CM | POA: Diagnosis present

## 2015-05-07 DIAGNOSIS — I248 Other forms of acute ischemic heart disease: Secondary | ICD-10-CM | POA: Diagnosis present

## 2015-05-07 DIAGNOSIS — Z8249 Family history of ischemic heart disease and other diseases of the circulatory system: Secondary | ICD-10-CM

## 2015-05-07 DIAGNOSIS — G4733 Obstructive sleep apnea (adult) (pediatric): Secondary | ICD-10-CM | POA: Diagnosis present

## 2015-05-07 DIAGNOSIS — Z885 Allergy status to narcotic agent status: Secondary | ICD-10-CM

## 2015-05-07 DIAGNOSIS — I69354 Hemiplegia and hemiparesis following cerebral infarction affecting left non-dominant side: Secondary | ICD-10-CM

## 2015-05-07 DIAGNOSIS — Z96653 Presence of artificial knee joint, bilateral: Secondary | ICD-10-CM | POA: Diagnosis present

## 2015-05-07 DIAGNOSIS — Z9889 Other specified postprocedural states: Secondary | ICD-10-CM

## 2015-05-07 DIAGNOSIS — Z79899 Other long term (current) drug therapy: Secondary | ICD-10-CM

## 2015-05-07 DIAGNOSIS — I509 Heart failure, unspecified: Secondary | ICD-10-CM

## 2015-05-07 DIAGNOSIS — M1990 Unspecified osteoarthritis, unspecified site: Secondary | ICD-10-CM | POA: Diagnosis present

## 2015-05-07 DIAGNOSIS — E1122 Type 2 diabetes mellitus with diabetic chronic kidney disease: Secondary | ICD-10-CM | POA: Diagnosis present

## 2015-05-07 DIAGNOSIS — R0902 Hypoxemia: Secondary | ICD-10-CM

## 2015-05-07 DIAGNOSIS — Z7951 Long term (current) use of inhaled steroids: Secondary | ICD-10-CM

## 2015-05-07 DIAGNOSIS — I159 Secondary hypertension, unspecified: Secondary | ICD-10-CM

## 2015-05-07 DIAGNOSIS — J9610 Chronic respiratory failure, unspecified whether with hypoxia or hypercapnia: Secondary | ICD-10-CM | POA: Diagnosis present

## 2015-05-07 DIAGNOSIS — H409 Unspecified glaucoma: Secondary | ICD-10-CM | POA: Diagnosis present

## 2015-05-07 DIAGNOSIS — Z807 Family history of other malignant neoplasms of lymphoid, hematopoietic and related tissues: Secondary | ICD-10-CM

## 2015-05-07 HISTORY — DX: Chronic kidney disease, stage 4 (severe): N18.4

## 2015-05-07 NOTE — ED Notes (Signed)
Pt from home via ems with hypertension. pts family states she was reading about 180 systolic, ems states it was "over 200 by palp", pt states she has slight headache, large amount of swelling to both legs.

## 2015-05-07 NOTE — ED Provider Notes (Signed)
[facility] Emergency Department Provider Note   ____________________________________________  Time seen: 0025  I have reviewed the triage vital signs and the nursing notes.   HISTORY  Chief Complaint Hypertension   History limited by: Not Limited   HPI Kristina Wilson is a 81 y.o. female who presents to the emergency department today with concerns for elevated blood pressure. The patient states that she thinks she might have forgotten her 10am dose of clonidine. Later in the day she started feeling like her blood pressure was elevated. One family checked her blood pressure was over 200. They tried giving her an early dose of her evening clonidine as well as a dose of hydralazine. The blood pressure did not significantly improve. The patient did complain of a headache which she currently still has. She denies any chest pain. In addition the patient has been complaining of increasing lower extremity swelling.   Past Medical History  Diagnosis Date  . Muscle weakness   . Chronic respiratory failure ([place])   . Pneumonia   . Sleep apnea   . CHF (congestive heart failure) ([place])   . Chronic kidney disease   . Dysphagia   . GERD (gastroesophageal reflux disease)   . Back pain   . Epilepsy ([place])   . TIA (transient ischemic attack)   . Hypertension   . Stroke ([place])   . Headache   . Arthritis   . Anemia     iron deficiency anemia  . Complication of anesthesia     difficult waking up after surgery (2013)    Patient Active Problem List   Diagnosis Date Noted  . UTI (urinary tract infection) 03/18/2015  . Acute sinusitis 03/18/2015  . Weakness of both lower limbs 03/15/2015  . GI bleed 12/27/2014  . Hypotension 12/27/2014  . Anemia of chronic disease 12/27/2014  . Acute posthemorrhagic anemia 12/27/2014  . Acute renal failure superimposed on stage 4 chronic kidney disease ([place]) 12/27/2014  . Cellulitis 12/27/2014    Past Surgical History   Procedure Laterality Date  . Back surgery    . Knee surgery    . Carotid endarterectomy    . Tonsillectomy    . Appendectomy    . Joint replacement Bilateral     Knee Replacements  . Dilation and curettage of uterus    . Breast surgery Right     Breast Biopsy  . Capd insertion N/A 04/12/2015    Procedure: LAPAROSCOPIC INSERTION CONTINUOUS AMBULATORY PERITONEAL DIALYSIS  (CAPD) CATHETER;  Surgeon: Katha Cabal, MD;  Location: [facility] ORS;  Service: Vascular;  Laterality: N/A;    Current Outpatient Rx  Name  Route  Sig  Dispense  Refill  . acetaminophen (TYLENOL) 325 MG tablet   Oral   Take 650 mg by mouth every 4 (four) hours as needed for mild pain, fever or headache.          . carvedilol (COREG) 6.25 MG tablet   Oral   Take 6.25 mg by mouth 2 (two) times daily.         . cloNIDine (CATAPRES) 0.3 MG tablet   Oral   Take 1 tablet (0.3 mg total) by mouth 3 (three) times daily.         . Cyanocobalamin (VITAMIN B 12 PO)   Oral   Take 1 capsule by mouth daily.         Marland Kitchen diltiazem (CARDIZEM CD) 240 MG 24 hr capsule   Oral   Take 240  mg by mouth 2 (two) times daily.          Marland Kitchen docusate sodium (COLACE) 100 MG capsule   Oral   Take 100 mg by mouth every other day.         Marland Kitchen doxycycline (VIBRA-TABS) 100 MG tablet   Oral   Take 1 tablet (100 mg total) by mouth 2 (two) times daily.   20 tablet   0   . fentaNYL (DURAGESIC - DOSED MCG/HR) 12 MCG/HR   Transdermal   Place 1 patch (12.5 mcg total) onto the skin every 3 (three) days.   5 patch   0   . fluticasone (FLONASE) 50 MCG/ACT nasal spray   Each Nare   Place 2 sprays into both nostrils daily.      2   . furosemide (LASIX) 40 MG tablet   Oral   Take 40 mg by mouth daily.         Marland Kitchen gabapentin (NEURONTIN) 100 MG capsule   Oral   Take 100 mg by mouth 3 (three) times daily.         Marland Kitchen gentamicin cream (GARAMYCIN) 0.1 %   Topical   Apply 1 application topically as needed.         .  hydrALAZINE (APRESOLINE) 50 MG tablet   Oral   Take 50 mg by mouth 4 (four) times daily.          . hydrochlorothiazide (HYDRODIURIL) 25 MG tablet   Oral   Take 1 tablet by mouth daily.         Marland Kitchen lidocaine (LIDODERM) 5 %   Transdermal   Place 1 patch onto the skin daily. Remove & Discard patch within 12 hours or as directed by MD   30 patch   0   . loratadine (CLARITIN) 10 MG tablet   Oral   Take 1 tablet (10 mg total) by mouth daily.         . metaxalone (SKELAXIN) 800 MG tablet   Oral   Take 800 mg by mouth every 8 (eight) hours as needed for muscle spasms.          . Multiple Vitamins-Minerals (CENTRUM SILVER PO)   Oral   Take 1 tablet by mouth daily.         . Multiple Vitamins-Minerals (PRESERVISION AREDS PO)   Oral   Take 1 capsule by mouth 2 (two) times daily.         . multivitamin (RENA-VIT) TABS tablet   Oral   Take 1 tablet by mouth daily.         . naproxen (NAPROSYN) 500 MG tablet   Oral   Take 1 tablet by mouth 2 (two) times daily.         . pantoprazole (PROTONIX) 40 MG tablet   Oral   Take 1 tablet by mouth 2 (two) times daily.         . phenytoin (DILANTIN) 100 MG ER capsule   Oral   Take 300 mg by mouth at bedtime.          . predniSONE (DELTASONE) 10 MG tablet   Oral   Take 10 mg by mouth daily.         . ranitidine (ZANTAC) 150 MG tablet   Oral   Take 150 mg by mouth 2 (two) times daily.         . sodium bicarbonate 650 MG tablet   Oral   Take 1,300 mg  by mouth 2 (two) times daily.           Allergies Enalapril; Tramadol; Capsaicin; Codeine; Hydrocodone; Oxycodone; and Vicodin  Family History  Problem Relation Age of Onset  . CAD Mother   . CAD Father   . Anemia Father   . CVA Sister   . Multiple myeloma Brother     Social History Social History  Substance Use Topics  . Smoking status: Former Smoker -- 2.00 packs/day for 20 years    Types: Cigarettes    Quit date: 06/04/1967  . Smokeless  tobacco: Never Used  . Alcohol Use: No    Review of Systems  Constitutional: Negative for fever. Cardiovascular: Negative for chest pain. Respiratory: Negative for shortness of breath. Gastrointestinal: Negative for abdominal pain, vomiting and diarrhea. Genitourinary: Negative for dysuria. Musculoskeletal: Negative for back pain. Skin: Negative for rash. Neurological: Negative for headaches, focal weakness or numbness.  10-point ROS otherwise negative.  ____________________________________________   PHYSICAL EXAM:  VITAL SIGNS: ED Triage Vitals  Enc Vitals Group     BP 05/07/15 2251 228/72 mmHg     Pulse Rate 05/07/15 2251 87     Resp 05/07/15 2251 18     Temp 05/07/15 2251 97.6 F (36.4 C)     Temp Source 05/07/15 2251 Oral     SpO2 05/07/15 2251 91 %     Weight 05/07/15 2251 260 lb (117.935 kg)     Height 05/07/15 2251 5' 4"  (1.626 m)     Head Cir --      Peak Flow --      Pain Score 05/07/15 2253 4   Constitutional: Alert and oriented. Well appearing and in no distress. Eyes: Conjunctivae are normal. PERRL. Normal extraocular movements. ENT   Head: Normocephalic and atraumatic.   Nose: No congestion/rhinnorhea.   Mouth/Throat: Mucous membranes are moist.   Neck: No stridor. Hematological/Lymphatic/Immunilogical: No cervical lymphadenopathy. Cardiovascular: Normal rate, regular rhythm.  No murmurs, rubs, or gallops. Respiratory: Normal respiratory effort without tachypnea nor retractions. Breath sounds are clear and equal bilaterally. No wheezes/rales/rhonchi. Gastrointestinal: Soft and nontender. No distention.  Genitourinary: Deferred Musculoskeletal: Normal range of motion in all extremities. No joint effusions.  2+ bilateral lower extremity pitting edema. Some erythema over bilateral shins. Neurologic:  Normal speech and language. No gross focal neurologic deficits are appreciated. Speech is normal.  Skin:  Skin is warm, dry and intact. No rash  noted. Psychiatric: Mood and affect are normal. Speech and behavior are normal. Patient exhibits appropriate insight and judgment.  ____________________________________________    LABS (pertinent positives/negatives)  Labs Reviewed  BASIC METABOLIC PANEL - Abnormal; Notable for the following:    Glucose, Bld 126 (*)    BUN 43 (*)    Creatinine, Ser 2.96 (*)    GFR calc non Af Amer 13 (*)    GFR calc Af Amer 15 (*)    All other components within normal limits  CBC - Abnormal; Notable for the following:    RBC 2.62 (*)    Hemoglobin 8.7 (*)    HCT 25.7 (*)    All other components within normal limits  TROPONIN I - Abnormal; Notable for the following:    Troponin I 0.11 (*)    All other components within normal limits  BRAIN NATRIURETIC PEPTIDE - Abnormal; Notable for the following:    B Natriuretic Peptide 805.0 (*)    All other components within normal limits     ____________________________________________   EKG  I,  Nance Pear, attending physician, personally viewed and interpreted this EKG  EKG Time: 2256 Rate: 88 Rhythm: sinus rhythm with 1st degree av block Axis: normal Intervals: qtc 459, 1st degree av block QRS: narrow ST changes: no st elevation Impression: abnormal ekg   ____________________________________________    RADIOLOGY  CT head  IMPRESSION: 1. No acute intracranial pathology seen on CT. 2. Mild small vessel ischemic microangiopathy. Small chronic lacunar infarct at the right basal ganglia.   ____________________________________________   PROCEDURES  Procedure(s) performed: None  Critical Care performed: Yes, see critical care note(s)  CRITICAL CARE Performed by: Nance Pear   Total critical care time: 30  Critical care time was exclusive of separately billable procedures and treating other patients.  Critical care was necessary to treat or prevent imminent or life-threatening deterioration.  Critical care was  time spent personally by me on the following activities: development of treatment plan with patient and/or surrogate as well as nursing, discussions with consultants, evaluation of patient's response to treatment, examination of patient, obtaining history from patient or surrogate, ordering and performing treatments and interventions, ordering and review of laboratory studies, ordering and review of radiographic studies, pulse oximetry and re-evaluation of patient's condition.  ____________________________________________   INITIAL IMPRESSION / ASSESSMENT AND PLAN / ED COURSE  Pertinent labs & imaging results that were available during my care of the patient were reviewed by me and considered in my medical decision making (see chart for details).  Patient presented to the emergency department today primarily with complaint for high blood pressure. Blood work was notable for an elevated troponin as well as BNP. Patient does have bilateral lower extremity edema. At this point unclear etiology of the elevated troponin. EKG did not show any ST elevation. I did discussed with family given my concerns for possible ACS leading to worsening heart function and I think this patient's best interest to be admitted. Patient and family agreeable with plan. Will have the hospitalist come and evaluate patient for admission, management.  ____________________________________________   FINAL CLINICAL IMPRESSION(S) / ED DIAGNOSES  Final diagnoses:  Elevated troponin  Congestive heart failure, unspecified congestive heart failure chronicity, unspecified congestive heart failure type ([place])  Secondary hypertension, unspecified     Nance Pear, MD 05/08/15 [id number]

## 2015-05-07 NOTE — ED Notes (Signed)
Lab notified of additional lab orders

## 2015-05-08 ENCOUNTER — Emergency Department: Payer: Medicare Other

## 2015-05-08 ENCOUNTER — Observation Stay: Payer: Medicare Other

## 2015-05-08 ENCOUNTER — Observation Stay
Admit: 2015-05-08 | Discharge: 2015-05-08 | Disposition: A | Discharge status: Home / Self Care | Payer: Medicare Other | Attending: Internal Medicine | Admitting: Internal Medicine

## 2015-05-08 ENCOUNTER — Encounter: Payer: Self-pay | Admitting: Internal Medicine

## 2015-05-08 DIAGNOSIS — I509 Heart failure, unspecified: Secondary | ICD-10-CM

## 2015-05-08 DIAGNOSIS — R7989 Other specified abnormal findings of blood chemistry: Secondary | ICD-10-CM | POA: Diagnosis present

## 2015-05-08 DIAGNOSIS — I10 Essential (primary) hypertension: Secondary | ICD-10-CM | POA: Diagnosis present

## 2015-05-08 DIAGNOSIS — R778 Other specified abnormalities of plasma proteins: Secondary | ICD-10-CM | POA: Diagnosis present

## 2015-05-08 DIAGNOSIS — G40909 Epilepsy, unspecified, not intractable, without status epilepticus: Secondary | ICD-10-CM

## 2015-05-08 DIAGNOSIS — K219 Gastro-esophageal reflux disease without esophagitis: Secondary | ICD-10-CM | POA: Diagnosis present

## 2015-05-08 LAB — CBC
HCT: 25.7 % — ABNORMAL LOW (ref 35.0–47.0)
Hemoglobin: 8.7 g/dL — ABNORMAL LOW (ref 12.0–16.0)
MCH: 33.2 pg (ref 26.0–34.0)
MCHC: 33.8 g/dL (ref 32.0–36.0)
MCV: 98.3 fL (ref 80.0–100.0)
PLATELETS: 294 10*3/uL (ref 150–440)
RBC: 2.62 MIL/uL — ABNORMAL LOW (ref 3.80–5.20)
RDW: 12.9 % (ref 11.5–14.5)
WBC: 9.6 10*3/uL (ref 3.6–11.0)

## 2015-05-08 LAB — TROPONIN I
TROPONIN I: 0.11 ng/mL — AB (ref <0.031)
TROPONIN I: 0.08 ng/mL — AB (ref <0.031)
TROPONIN I: 0.08 ng/mL — AB (ref <0.031)
Troponin I: 0.12 ng/mL — ABNORMAL HIGH (ref <0.031)

## 2015-05-08 LAB — PHENYTOIN LEVEL, TOTAL: PHENYTOIN LVL: 6 ug/mL — AB (ref 10.0–20.0)

## 2015-05-08 LAB — BASIC METABOLIC PANEL
Anion gap: 10 (ref 5–15)
BUN: 43 mg/dL — AB (ref 6–20)
CALCIUM: 9.4 mg/dL (ref 8.9–10.3)
CO2: 28 mmol/L (ref 22–32)
CREATININE: 2.96 mg/dL — AB (ref 0.44–1.00)
Chloride: 101 mmol/L (ref 101–111)
GFR calc Af Amer: 15 mL/min — ABNORMAL LOW (ref >60)
GFR, EST NON AFRICAN AMERICAN: 13 mL/min — AB (ref >60)
Glucose, Bld: 126 mg/dL — ABNORMAL HIGH (ref 65–99)
POTASSIUM: 4.5 mmol/L (ref 3.5–5.1)
SODIUM: 139 mmol/L (ref 135–145)

## 2015-05-08 LAB — BRAIN NATRIURETIC PEPTIDE: B NATRIURETIC PEPTIDE 5: 805 pg/mL — AB (ref 0.0–100.0)

## 2015-05-08 LAB — MRSA PCR SCREENING: MRSA by PCR: POSITIVE — AB

## 2015-05-08 MED ORDER — SODIUM BICARBONATE 650 MG PO TABS
1300.0000 mg | ORAL_TABLET | Freq: Two times a day (BID) | ORAL | Status: DC
  Administered 2015-05-08 – 2015-05-10 (×6): 1300 mg via ORAL
  Filled 2015-05-08 (×7): qty 2

## 2015-05-08 MED ORDER — NITROGLYCERIN 0.4 MG/SPRAY TL SOLN
1.0000 | Status: DC | PRN
  Filled 2015-05-08: qty 4.9

## 2015-05-08 MED ORDER — HYDRALAZINE HCL 20 MG/ML IJ SOLN: 10.0000 mg | INTRAMUSCULAR | Status: DC | PRN

## 2015-05-08 MED ORDER — MUPIROCIN 2 % EX OINT
1.0000 "application " | TOPICAL_OINTMENT | Freq: Two times a day (BID) | CUTANEOUS | Status: AC
  Administered 2015-05-08 – 2015-05-12 (×10): 1 via NASAL
  Filled 2015-05-08 (×4): qty 22

## 2015-05-08 MED ORDER — ACETAMINOPHEN 325 MG PO TABS
650.0000 mg | ORAL_TABLET | Freq: Four times a day (QID) | ORAL | Status: DC | PRN
  Administered 2015-05-08 – 2015-05-20 (×11): 650 mg via ORAL
  Filled 2015-05-08 (×12): qty 2

## 2015-05-08 MED ORDER — NITROGLYCERIN 0.4 MG SL SUBL
0.4000 mg | SUBLINGUAL_TABLET | SUBLINGUAL | Status: DC | PRN
  Administered 2015-05-08 (×2): 0.4 mg via SUBLINGUAL
  Filled 2015-05-08 (×2): qty 1

## 2015-05-08 MED ORDER — DILTIAZEM HCL ER COATED BEADS 240 MG PO CP24
240.0000 mg | ORAL_CAPSULE | Freq: Two times a day (BID) | ORAL | Status: DC
Start: 2015-05-08 — End: 2015-05-09
  Administered 2015-05-08 – 2015-05-09 (×3): 240 mg via ORAL
  Filled 2015-05-08 (×3): qty 1

## 2015-05-08 MED ORDER — SODIUM CHLORIDE 0.9 % IJ SOLN
3.0000 mL | Freq: Two times a day (BID) | INTRAMUSCULAR | Status: DC
  Administered 2015-05-08 – 2015-05-22 (×27): 3 mL via INTRAVENOUS

## 2015-05-08 MED ORDER — PANTOPRAZOLE SODIUM 40 MG PO TBEC
40.0000 mg | DELAYED_RELEASE_TABLET | Freq: Two times a day (BID) | ORAL | Status: DC
  Administered 2015-05-08 – 2015-05-22 (×27): 40 mg via ORAL
  Filled 2015-05-08 (×30): qty 1

## 2015-05-08 MED ORDER — FUROSEMIDE 40 MG PO TABS
40.0000 mg | ORAL_TABLET | Freq: Every day | ORAL | Status: DC
  Administered 2015-05-08 – 2015-05-22 (×11): 40 mg via ORAL
  Filled 2015-05-08 (×13): qty 1

## 2015-05-08 MED ORDER — CLONIDINE HCL 0.1 MG PO TABS
0.3000 mg | ORAL_TABLET | Freq: Three times a day (TID) | ORAL | Status: DC
  Administered 2015-05-08 – 2015-05-22 (×39): 0.3 mg via ORAL
  Filled 2015-05-08 (×41): qty 3

## 2015-05-08 MED ORDER — LABETALOL HCL 5 MG/ML IV SOLN
10.0000 mg | INTRAVENOUS | Status: DC | PRN
  Administered 2015-05-08: 10 mg via INTRAVENOUS
  Filled 2015-05-08: qty 4

## 2015-05-08 MED ORDER — LABETALOL HCL 5 MG/ML IV SOLN
10.0000 mg | INTRAVENOUS | Status: DC | PRN
  Administered 2015-05-08 – 2015-05-14 (×3): 10 mg via INTRAVENOUS
  Filled 2015-05-08 (×4): qty 4

## 2015-05-08 MED ORDER — CARVEDILOL 6.25 MG PO TABS
6.2500 mg | ORAL_TABLET | Freq: Two times a day (BID) | ORAL | Status: DC
  Administered 2015-05-08 – 2015-05-10 (×6): 6.25 mg via ORAL
  Filled 2015-05-08 (×7): qty 1

## 2015-05-08 MED ORDER — ACETAMINOPHEN 325 MG PO TABS
650.0000 mg | ORAL_TABLET | Freq: Once | ORAL | Status: AC
  Administered 2015-05-08: 650 mg via ORAL
  Filled 2015-05-08: qty 2

## 2015-05-08 MED ORDER — HYDRALAZINE HCL 50 MG PO TABS
50.0000 mg | ORAL_TABLET | Freq: Four times a day (QID) | ORAL | Status: DC
  Administered 2015-05-08 (×4): 50 mg via ORAL
  Filled 2015-05-08 (×4): qty 1

## 2015-05-08 MED ORDER — HEPARIN SODIUM (PORCINE) 5000 UNIT/ML IJ SOLN
5000.0000 [IU] | Freq: Three times a day (TID) | INTRAMUSCULAR | Status: DC
  Administered 2015-05-08 – 2015-05-10 (×8): 5000 [IU] via SUBCUTANEOUS
  Filled 2015-05-08 (×8): qty 1

## 2015-05-08 MED ORDER — ENSURE ENLIVE PO LIQD
237.0000 mL | ORAL | Status: DC
  Administered 2015-05-08 – 2015-05-10 (×3): 237 mL via ORAL

## 2015-05-08 MED ORDER — FENTANYL 12 MCG/HR TD PT72
12.5000 ug | MEDICATED_PATCH | TRANSDERMAL | Status: DC
  Administered 2015-05-08: 12.5 ug via TRANSDERMAL
  Filled 2015-05-08: qty 1

## 2015-05-08 MED ORDER — HYDRALAZINE HCL 20 MG/ML IJ SOLN
10.0000 mg | INTRAMUSCULAR | Status: DC | PRN
  Administered 2015-05-08 – 2015-05-21 (×9): 10 mg via INTRAVENOUS
  Filled 2015-05-08 (×13): qty 1

## 2015-05-08 MED ORDER — NITROGLYCERIN 2 % TD OINT
0.5000 [in_us] | TOPICAL_OINTMENT | Freq: Four times a day (QID) | TRANSDERMAL | Status: DC
  Administered 2015-05-08 – 2015-05-14 (×21): 0.5 [in_us] via TOPICAL
  Administered 2015-05-15: 1 [in_us] via TOPICAL
  Administered 2015-05-15 (×3): 0.5 [in_us] via TOPICAL
  Administered 2015-05-16: 1 [in_us] via TOPICAL
  Administered 2015-05-17 – 2015-05-22 (×17): 0.5 [in_us] via TOPICAL
  Filled 2015-05-08 (×43): qty 1

## 2015-05-08 MED ORDER — INFLUENZA VAC SPLIT QUAD 0.5 ML IM SUSY
0.5000 mL | PREFILLED_SYRINGE | INTRAMUSCULAR | Status: AC
  Administered 2015-05-09: 0.5 mL via INTRAMUSCULAR
  Filled 2015-05-08: qty 0.5

## 2015-05-08 MED ORDER — ASPIRIN EC 81 MG PO TBEC
81.0000 mg | DELAYED_RELEASE_TABLET | Freq: Every day | ORAL | Status: DC
  Administered 2015-05-09 – 2015-05-22 (×12): 81 mg via ORAL
  Filled 2015-05-08 (×12): qty 1

## 2015-05-08 MED ORDER — CHLORHEXIDINE GLUCONATE CLOTH 2 % EX PADS
6.0000 | MEDICATED_PAD | Freq: Every day | CUTANEOUS | Status: AC
  Administered 2015-05-09 – 2015-05-13 (×5): 6 via TOPICAL

## 2015-05-08 MED ORDER — PHENYTOIN SODIUM EXTENDED 100 MG PO CAPS
300.0000 mg | ORAL_CAPSULE | Freq: Every day | ORAL | Status: DC
  Administered 2015-05-08 – 2015-05-21 (×14): 300 mg via ORAL
  Filled 2015-05-08 (×16): qty 3

## 2015-05-08 MED ORDER — HYDROCHLOROTHIAZIDE 25 MG PO TABS
25.0000 mg | ORAL_TABLET | Freq: Every day | ORAL | Status: DC
  Administered 2015-05-08 – 2015-05-09 (×2): 25 mg via ORAL
  Filled 2015-05-08 (×2): qty 1

## 2015-05-08 MED ORDER — GENTAMICIN SULFATE 0.1 % EX CREA
1.0000 "application " | TOPICAL_CREAM | Freq: Every day | CUTANEOUS | Status: DC
  Administered 2015-05-08 – 2015-05-22 (×10): 1 via TOPICAL
  Filled 2015-05-08 (×2): qty 15

## 2015-05-08 MED ORDER — ASPIRIN 81 MG PO CHEW
324.0000 mg | CHEWABLE_TABLET | Freq: Once | ORAL | Status: AC
Start: 2015-05-08 — End: 2015-05-08
  Administered 2015-05-08: 324 mg via ORAL
  Filled 2015-05-08: qty 4

## 2015-05-08 NOTE — Consult Note (Signed)
[facility] Cardiology Consultation Note  Patient ID: Kristina Wilson, MRN: [id number], DOB/AGE: Aug 17, 1922 81 y.o. Admit date: 05/07/2015   Date of Consult: 05/08/2015 Primary Physician: Albina Billet, MD Primary Cardiologist: None  Chief Complaint:  Chief Complaint  Patient presents with  . Hypertension   Reason for Consult: elevated troponin with malignant hypertension  HPI: 81 y.o. female with known essential hypertension kidney disease stage IV with a GFR of 15 thickened new onset of anemia with hemoglobin of 8.7 with a headache weakness fatigue and shortness of breath. The patient has had new onset severe hypertension with 030 systolic when seen in the emergency room. At that time she did have some relief of her shortness of breath but still has off-and-on headache. She does have the data troponin is 0.12 and an EKG showing normal sinus rhythm and no evidence of myocardial infarction. Majority of this troponin level elevation is likely secondary to demand ischemia from severe hypertension and or chronic kidney disease. Blood pressure has been improved with hydralazine furosemide diltiazem carvedilol and clonidine. There is been no evidence of significant heart rate changes with the majority of the medication listed today. There is subcutaneous to be no evidence of true angina and/or congestive heart failure  Past Medical History  Diagnosis Date  . Muscle weakness   . Chronic respiratory failure ([place])   . Pneumonia   . Sleep apnea   . CHF (congestive heart failure) ([place])   . CKD (chronic kidney disease), stage IV ([place])   . Dysphagia   . GERD (gastroesophageal reflux disease)   . Back pain   . Epilepsy ([place])   . Hypertension   . Stroke ([place])   . Headache   . Arthritis   . Anemia     iron deficiency anemia  . Complication of anesthesia     difficult waking up after surgery (2013)      Surgical History:  Past Surgical History  Procedure Laterality Date  . Back surgery     . Knee surgery    . Carotid endarterectomy    . Tonsillectomy    . Appendectomy    . Joint replacement Bilateral     Knee Replacements  . Dilation and curettage of uterus    . Breast surgery Right     Breast Biopsy  . Capd insertion N/A 04/12/2015    Procedure: LAPAROSCOPIC INSERTION CONTINUOUS AMBULATORY PERITONEAL DIALYSIS  (CAPD) CATHETER;  Surgeon: Katha Cabal, MD;  Location: [facility] ORS;  Service: Vascular;  Laterality: N/A;     Home Meds: Prior to Admission medications   Medication Sig Start Date End Date Taking? Authorizing Provider  acetaminophen (TYLENOL) 325 MG tablet Take 650 mg by mouth every 4 (four) hours as needed for mild pain, fever or headache.    Yes Historical Provider, MD  carvedilol (COREG) 6.25 MG tablet Take 6.25 mg by mouth 2 (two) times daily.   Yes Historical Provider, MD  cloNIDine (CATAPRES) 0.3 MG tablet Take 1 tablet (0.3 mg total) by mouth 3 (three) times daily. 03/18/15  Yes Srikar Sudini, MD  Coenzyme Q10 (CO Q 10 PO) Take 1 tablet by mouth daily.   Yes Historical Provider, MD  Cyanocobalamin (VITAMIN B 12 PO) Take 1 capsule by mouth daily.   Yes Historical Provider, MD  diltiazem (CARDIZEM CD) 240 MG 24 hr capsule Take 240 mg by mouth 2 (two) times daily.    Yes Historical Provider, MD  docusate sodium (COLACE) 100 MG capsule Take 100  mg by mouth every other day.   Yes Historical Provider, MD  doxycycline (VIBRA-TABS) 100 MG tablet Take 1 tablet (100 mg total) by mouth 2 (two) times daily. 03/03/15  Yes Orbie Pyo, MD  fentaNYL (DURAGESIC - DOSED MCG/HR) 12 MCG/HR Place 1 patch (12.5 mcg total) onto the skin every 3 (three) days. 03/18/15  Yes Srikar Sudini, MD  fluticasone (FLONASE) 50 MCG/ACT nasal spray Place 2 sprays into both nostrils daily. 03/18/15  Yes Srikar Sudini, MD  furosemide (LASIX) 40 MG tablet Take 40 mg by mouth daily.   Yes Historical Provider, MD  gabapentin (NEURONTIN) 100 MG capsule Take 100 mg by mouth 3 (three) times  daily.   Yes Historical Provider, MD  gentamicin cream (GARAMYCIN) 0.1 % Apply 1 application topically as needed. 04/26/15  Yes Historical Provider, MD  hydrALAZINE (APRESOLINE) 50 MG tablet Take 50 mg by mouth 4 (four) times daily.    Yes Historical Provider, MD  hydrochlorothiazide (HYDRODIURIL) 25 MG tablet Take 1 tablet by mouth daily. 03/25/15  Yes Historical Provider, MD  lidocaine (LIDODERM) 5 % Place 1 patch onto the skin daily. Remove & Discard patch within 12 hours or as directed by MD 03/18/15  Yes Hillary Bow, MD  loratadine (CLARITIN) 10 MG tablet Take 1 tablet (10 mg total) by mouth daily. 03/18/15  Yes Srikar Sudini, MD  metaxalone (SKELAXIN) 800 MG tablet Take 800 mg by mouth every 8 (eight) hours as needed for muscle spasms.    Yes Historical Provider, MD  Multiple Vitamins-Minerals (CENTRUM SILVER PO) Take 1 tablet by mouth daily.   Yes Historical Provider, MD  Multiple Vitamins-Minerals (PRESERVISION AREDS PO) Take 1 capsule by mouth 2 (two) times daily.   Yes Historical Provider, MD  multivitamin (RENA-VIT) TABS tablet Take 1 tablet by mouth daily.   Yes Historical Provider, MD  naproxen (NAPROSYN) 500 MG tablet Take 1 tablet by mouth 2 (two) times daily. 03/20/15  Yes Historical Provider, MD  pantoprazole (PROTONIX) 40 MG tablet Take 1 tablet by mouth 2 (two) times daily. 03/13/15  Yes Historical Provider, MD  phenytoin (DILANTIN) 100 MG ER capsule Take 300 mg by mouth at bedtime.    Yes Historical Provider, MD  predniSONE (DELTASONE) 10 MG tablet Take 10 mg by mouth daily.   Yes Historical Provider, MD  ranitidine (ZANTAC) 150 MG tablet Take 150 mg by mouth 2 (two) times daily.   Yes Historical Provider, MD  sodium bicarbonate 650 MG tablet Take 1,300 mg by mouth 2 (two) times daily.   Yes Historical Provider, MD    Inpatient Medications:  . [START ON 05/09/2015] aspirin EC  81 mg Oral Daily  . carvedilol  6.25 mg Oral BID  . cloNIDine  0.3 mg Oral TID  . diltiazem  240 mg Oral  BID  . fentaNYL  12.5 mcg Transdermal Q72H  . furosemide  40 mg Oral Daily  . heparin  5,000 Units Subcutaneous 3 times per day  . hydrALAZINE  50 mg Oral QID  . hydrochlorothiazide  25 mg Oral Daily  . [START ON 05/09/2015] Influenza vac split quadrivalent PF  0.5 mL Intramuscular Tomorrow-1000  . nitroGLYCERIN  0.5 inch Topical 4 times per day  . pantoprazole  40 mg Oral BID  . phenytoin  300 mg Oral QHS  . sodium bicarbonate  1,300 mg Oral BID  . sodium chloride  3 mL Intravenous Q12H      Allergies:  Allergies  Allergen Reactions  . Enalapril Anaphylaxis  .  Tramadol Shortness Of Breath  . Capsaicin Other (See Comments)    Reaction:  Unknown   . Codeine Other (See Comments)    Reaction:  Unknown   . Hydrocodone Other (See Comments)    Reaction:  Unknown   . Oxycodone Other (See Comments)    Reaction:  Unknown   . Vicodin [Hydrocodone-Acetaminophen] Other (See Comments)    Reaction:  Unknown     Social History   Social History  . Marital Status: Widowed    Spouse Name: N/A  . Number of Children: N/A  . Years of Education: N/A   Occupational History  . Not on file.   Social History Main Topics  . Smoking status: Former Smoker -- 2.00 packs/day for 20 years    Types: Cigarettes    Quit date: 06/04/1967  . Smokeless tobacco: Never Used  . Alcohol Use: No  . Drug Use: No  . Sexual Activity: No   Other Topics Concern  . Not on file   Social History Narrative     Family History  Problem Relation Age of Onset  . CAD Mother   . CAD Father   . Anemia Father   . CVA Sister   . Multiple myeloma Brother      Review of Systems Positive for headache and shortness of breath Negative for: General:  chills, fever, night sweats or weight changes.  Cardiovascular: PND orthopnea syncope dizziness  Dermatological skin lesions rashes Respiratory: Cough congestion Urologic: Frequent urination urination at night and hematuria Abdominal: negative for nausea,  vomiting, diarrhea, bright red blood per rectum, melena, or hematemesis Neurologic: negative for visual changes, and/or hearing changes  All other systems reviewed and are otherwise negative except as noted above.  Labs:  Recent Labs  05/07/15 2256 05/08/15 0559  TROPONINI 0.11* 0.12*   Lab Results  Component Value Date   WBC 9.6 05/07/2015   HGB 8.7* 05/07/2015   HCT 25.7* 05/07/2015   MCV 98.3 05/07/2015   PLT 294 05/07/2015    Recent Labs Lab 05/07/15 2256  NA 139  K 4.5  CL 101  CO2 28  BUN 43*  CREATININE 2.96*  CALCIUM 9.4  GLUCOSE 126*   Lab Results  Component Value Date   CHOL 206* 07/16/2013   HDL 42 07/16/2013   LDLCALC 112* 07/16/2013   TRIG 259* 07/16/2013   No results found for: DDIMER  Radiology/Studies:  Ct Head Wo Contrast  05/08/2015   CLINICAL DATA:  Acute onset of high blood pressure. Mild headache. Initial encounter.  EXAM: CT HEAD WITHOUT CONTRAST  TECHNIQUE: Contiguous axial images were obtained from the base of the skull through the vertex without intravenous contrast.  COMPARISON:  CT of the head performed 03/15/2015  FINDINGS: There is no evidence of acute infarction, mass lesion, or intra- or extra-axial hemorrhage on CT.  Mild periventricular white matter change likely reflects small vessel ischemic microangiopathy. A small chronic lacunar infarct is noted at the right basal ganglia.  The posterior fossa, including the cerebellum, brainstem and fourth ventricle, is within normal limits. The third and lateral ventricles are unremarkable in appearance. The cerebral hemispheres are symmetric in appearance, with normal gray-white differentiation. No mass effect or midline shift is seen.  There is no evidence of fracture; visualized osseous structures are unremarkable in appearance. The orbits are within normal limits. The paranasal sinuses and mastoid air cells are well-aerated. No significant soft tissue abnormalities are seen.  IMPRESSION: 1. No  acute intracranial pathology seen on  CT. 2. Mild small vessel ischemic microangiopathy. Small chronic lacunar infarct at the right basal ganglia.   Electronically Signed   By: Garald Balding M.D.   On: 05/08/2015 01:32    EKG: Normal sinus rhythm. Normal EKG  Weights: Filed Weights   05/07/15 2251  Weight: 260 lb (117.935 kg)     Physical Exam: Blood pressure 123/35, pulse 56, temperature 98 F (36.7 C), temperature source Oral, resp. rate 16, height 5' 4"  (1.626 m), weight 260 lb (117.935 kg), SpO2 97 %. Body mass index is 44.61 kg/(m^2). General: Well developed, well nourished, in no acute distress. Head eyes ears nose throat: Normocephalic, atraumatic, sclera non-icteric, no xanthomas, nares are without discharge. No apparent thyromegaly and/or mass  Lungs: Normal respiratory effort.  Diffuse wheezes, few basilar rales, no rhonchi.  Heart: RRR with normal S1 S2. no murmur gallop, no rub, PMI is diffuse carotid upstroke normal without bruit, jugular venous pressure is normal Abdomen: Soft, non-tender,  distended with normoactive bowel sounds. Cannot assess that oh splenomegaly due to increased abdominal girth  Extremities: 1+ edema. no cyanosis, no clubbing, no ulcers  Peripheral : 2+ bilateral upper extremity pulses, 2+ bilateral femoral pulses, 2+ bilateral dorsal pedal pulse Neuro not Alert and oriented. No facial asymmetry. No focal deficit. Moves all extremities spontaneously. Musculoskeletal: Normal muscle tone without kyphosis     Assessment: 80 year old female with essential hypertension now with malignant hypertension chronic kidney disease stage IV anemia likely exacerbating hypertension and likely causing demand ischemia with an elevated troponin of 0.12 and no current evidence of acute coronary syndrome heart failure or true angina  Plan: 1. Intravenous Lasix for any significant hypertension and or retention due to anemia and chronic kidney disease stage IV 2. Continue  combination of medication management for hypertension control which appears to be working fairly well 3. No further cardiac intervention at this time due to related troponin most consistent with demand ischemia rather than acute coronary syndrome 4. Begin ambulation if able and adjustments of medications 5. Call if further questions  Signed, Corey Skains M.D. [facility] 05/08/2015, 12:58 PM

## 2015-05-08 NOTE — ED Notes (Signed)
Admitting MD at bedside.

## 2015-05-08 NOTE — Progress Notes (Signed)
[facility]  Patient Status: Active (receiving services up to time of hospitalization)  [facility] is providing the following services: RN and HHA  If patient discharges after hours, please call [phone].   Sherryll Burger 05/08/2015, 12:27 PM

## 2015-05-08 NOTE — Progress Notes (Signed)
Patient was admitted from the ER d/t elevated BP. Om admission to the unit patient was on 2L of supplemental oxygen. PRN antihypertensive med was administered for BP>180.She also received one time dose of tylenol for headache . Patient's skin was checked with Richardo Priest.

## 2015-05-08 NOTE — ED Notes (Signed)
Patient transported to CT 

## 2015-05-08 NOTE — Progress Notes (Signed)
Patient ID: [name] ADESHANA Wilson DOB: Dec 27, 1922, 5710-21-26  MRN: [id number] [facility] PROGRESS NOTE  FARRA NIKOLIC EXB:[id number] DOB: 08-05-1922 DOA: 05/07/2015 PCP: Jaclyn Shaggy, MD  HPI/Subjective: Patient having chest pain. A second NTG had to be given. Patient with no shortness of breath sweating or nausea. I spoke with the patient's daughter they were concerned about an outpatient creatinine clearance 24 hour urine and I told her I would ordered here.  Objective: Filed Vitals:   05/08/15 1039  BP: 128/37  Pulse:   Temp:   Resp:     Filed Weights   05/07/15 2251  Weight: 117.935 kg (260 lb)    ROS: Review of Systems  Constitutional: Negative for fever and chills.  Eyes: Negative for blurred vision.  Respiratory: Negative for cough and shortness of breath.   Cardiovascular: Positive for chest pain.  Gastrointestinal: Negative for nausea, vomiting, abdominal pain, diarrhea and constipation.  Genitourinary: Negative for dysuria.  Musculoskeletal: Negative for joint pain.  Neurological: Negative for dizziness and headaches.   Exam: Physical Exam  Constitutional: She is oriented to person, place, and time.  HENT:  Nose: No mucosal edema.  Mouth/Throat: No oropharyngeal exudate or posterior oropharyngeal edema.  Eyes: Conjunctivae, EOM and lids are normal. Pupils are equal, round, and reactive to light.  Neck: No JVD present. Carotid bruit is not present. No edema present. No thyroid mass and no thyromegaly present.  Cardiovascular: S1 normal and S2 normal.  Exam reveals no gallop.   No murmur heard. Pulses:      Dorsalis pedis pulses are 2+ on the right side, and 2+ on the left side.  Respiratory: No respiratory distress. She has no wheezes. She has no rhonchi. She has no rales.  GI: Soft. Bowel sounds are normal. There is no tenderness.  Musculoskeletal:       Right ankle: She exhibits swelling.       Left ankle: She exhibits swelling.   Lymphadenopathy:    She has no cervical adenopathy.  Neurological: She is alert and oriented to person, place, and time. No cranial nerve deficit.  Skin: Skin is warm. Nails show no clubbing.  Chronic lower extremity discoloration bilaterally  Psychiatric: She has a normal mood and affect.    Data Reviewed: Basic Metabolic Panel:  Recent Labs Lab 05/07/15 2256  NA 139  K 4.5  CL 101  CO2 28  GLUCOSE 126*  BUN 43*  CREATININE 2.96*  CALCIUM 9.4   CBC:  Recent Labs Lab 05/07/15 2256  WBC 9.6  HGB 8.7*  HCT 25.7*  MCV 98.3  PLT 294   Cardiac Enzymes:  Recent Labs Lab 05/07/15 2256 05/08/15 0559  TROPONINI 0.11* 0.12*   BNP (last 3 results)  Recent Labs  02/02/15 2121 05/07/15 2256  BNP 228.0* 805.0*      Recent Results (from the past 240 hour(s))  MRSA PCR Screening     Status: Abnormal   Collection Time: 05/08/15  7:50 AM  Result Value Ref Range Status   MRSA by PCR POSITIVE (A) NEGATIVE Final    Comment:        The GeneXpert MRSA Assay (FDA approved for NASAL specimens only), is one component of a comprehensive MRSA colonization surveillance program. It is not intended to diagnose MRSA infection nor to guide or monitor treatment for MRSA infections. CRITICAL RESULT CALLED TO, READ BACK BY AND VERIFIED WITH: JESSICA CHRISTMAS ON 05/08/15 AT 1022 BY QSD  Studies: Ct Head Wo Contrast  05/08/2015   CLINICAL DATA:  Acute onset of high blood pressure. Mild headache. Initial encounter.  EXAM: CT HEAD WITHOUT CONTRAST  TECHNIQUE: Contiguous axial images were obtained from the base of the skull through the vertex without intravenous contrast.  COMPARISON:  CT of the head performed 03/15/2015  FINDINGS: There is no evidence of acute infarction, mass lesion, or intra- or extra-axial hemorrhage on CT.  Mild periventricular white matter change likely reflects small vessel ischemic microangiopathy. A small chronic lacunar infarct is noted at the right  basal ganglia.  The posterior fossa, including the cerebellum, brainstem and fourth ventricle, is within normal limits. The third and lateral ventricles are unremarkable in appearance. The cerebral hemispheres are symmetric in appearance, with normal gray-white differentiation. No mass effect or midline shift is seen.  There is no evidence of fracture; visualized osseous structures are unremarkable in appearance. The orbits are within normal limits. The paranasal sinuses and mastoid air cells are well-aerated. No significant soft tissue abnormalities are seen.  IMPRESSION: 1. No acute intracranial pathology seen on CT. 2. Mild small vessel ischemic microangiopathy. Small chronic lacunar infarct at the right basal ganglia.   Electronically Signed   By: Roanna Raider M.D.   On: 05/08/2015 01:32    Scheduled Meds: . carvedilol  6.25 mg Oral BID  . cloNIDine  0.3 mg Oral TID  . diltiazem  240 mg Oral BID  . fentaNYL  12.5 mcg Transdermal Q72H  . furosemide  40 mg Oral Daily  . heparin  5,000 Units Subcutaneous 3 times per day  . hydrALAZINE  50 mg Oral QID  . hydrochlorothiazide  25 mg Oral Daily  . [START ON 05/09/2015] Influenza vac split quadrivalent PF  0.5 mL Intramuscular Tomorrow-1000  . pantoprazole  40 mg Oral BID  . phenytoin  300 mg Oral QHS  . sodium bicarbonate  1,300 mg Oral BID  . sodium chloride  3 mL Intravenous Q12H    Assessment/Plan:  1. Chest pain, elevated troponin. We are trying a second nitroglycerin right at this point. I will add Nitropatch. I will get a cardiology consult but likely this will be conservative management. And aspirin. Patient already on Coreg. 2. Accelerated hypertension- blood pressure better now, restarting medications 3. Chronic pain- restart fentanyl patch 4. Gastroesophageal reflux disease without esophagitis continue Protonix 5. Chronic kidney disease stage IV- continue to monitor 6. History of seizure disorder on phenytoin 7. Patient's daughter  to bring in her eyedrops for glaucoma  Code Status:     Code Status Orders        Start     Ordered   05/08/15 0514  Full code   Continuous     05/08/15 0513     Family Communication: Spoke with daughter on phone Disposition Plan: To be determined  Consultants:  Cardiology  Time spent: 35 minutes  Dail Highland  [facility]

## 2015-05-08 NOTE — H&P (Signed)
[place] at [place] NAME: Kristina Wilson    MR#:  [id number]  DATE OF BIRTH:  [birth year]  DATE OF ADMISSION:  05/07/2015  PRIMARY CARE PHYSICIAN: Albina Billet, MD   REQUESTING/REFERRING PHYSICIAN: Archie Balboa, MD  CHIEF COMPLAINT:   Chief Complaint  Patient presents with  . Hypertension    HISTORY OF PRESENT ILLNESS:  Kristina Wilson  is a 81 y.o. female who presents with headache and significantly elevated blood pressure. Patient is here with her 2 daughters. Patient and family state that her blood pressure rose today to the 686H systolic, and despite taking some extra oral antihypertensives that was not coming down. She began to develop headache, and very mild shortness of breath, and decided to come in to the ED for evaluation and treatment. In the ED, blood pressure remains elevated with systolic in the 683F and 290S. On lab evaluation, her troponin was elevated to 0.11. Patient denies any chest pain at any point. Other workup in the ED including head CT is negative and/or stable. Hospitalists were called for accelerated hypertension and elevated troponin.  PAST MEDICAL HISTORY:   Past Medical History  Diagnosis Date  . Muscle weakness   . Chronic respiratory failure ([place])   . Pneumonia   . Sleep apnea   . CHF (congestive heart failure) ([place])   . CKD (chronic kidney disease), stage IV ([place])   . Dysphagia   . GERD (gastroesophageal reflux disease)   . Back pain   . Epilepsy ([place])   . Hypertension   . Stroke ([place])   . Headache   . Arthritis   . Anemia     iron deficiency anemia  . Complication of anesthesia     difficult waking up after surgery (2013)    PAST SURGICAL HISTORY:   Past Surgical History  Procedure Laterality Date  . Back surgery    . Knee surgery    . Carotid endarterectomy    . Tonsillectomy    . Appendectomy    . Joint replacement Bilateral     Knee Replacements  . Dilation and curettage of  uterus    . Breast surgery Right     Breast Biopsy  . Capd insertion N/A 04/12/2015    Procedure: LAPAROSCOPIC INSERTION CONTINUOUS AMBULATORY PERITONEAL DIALYSIS  (CAPD) CATHETER;  Surgeon: Katha Cabal, MD;  Location: [facility] ORS;  Service: Vascular;  Laterality: N/A;    SOCIAL HISTORY:   Social History  Substance Use Topics  . Smoking status: Former Smoker -- 2.00 packs/day for 20 years    Types: Cigarettes    Quit date: 06/04/1967  . Smokeless tobacco: Never Used  . Alcohol Use: No    FAMILY HISTORY:   Family History  Problem Relation Age of Onset  . CAD Mother   . CAD Father   . Anemia Father   . CVA Sister   . Multiple myeloma Brother     DRUG ALLERGIES:   Allergies  Allergen Reactions  . Enalapril Anaphylaxis  . Tramadol Shortness Of Breath  . Capsaicin Other (See Comments)    Reaction:  Unknown   . Codeine Other (See Comments)    Reaction:  Unknown   . Hydrocodone Other (See Comments)    Reaction:  Unknown   . Oxycodone Other (See Comments)    Reaction:  Unknown   . Vicodin [Hydrocodone-Acetaminophen] Other (See Comments)    Reaction:  Unknown     MEDICATIONS AT HOME:  Prior to Admission medications   Medication Sig Start Date End Date Taking? Authorizing Provider  acetaminophen (TYLENOL) 325 MG tablet Take 650 mg by mouth every 4 (four) hours as needed for mild pain, fever or headache.    Yes Historical Provider, MD  carvedilol (COREG) 6.25 MG tablet Take 6.25 mg by mouth 2 (two) times daily.   Yes Historical Provider, MD  cloNIDine (CATAPRES) 0.3 MG tablet Take 1 tablet (0.3 mg total) by mouth 3 (three) times daily. 03/18/15  Yes Srikar Sudini, MD  Coenzyme Q10 (CO Q 10 PO) Take 1 tablet by mouth daily.   Yes Historical Provider, MD  Cyanocobalamin (VITAMIN B 12 PO) Take 1 capsule by mouth daily.   Yes Historical Provider, MD  diltiazem (CARDIZEM CD) 240 MG 24 hr capsule Take 240 mg by mouth 2 (two) times daily.    Yes Historical Provider, MD   docusate sodium (COLACE) 100 MG capsule Take 100 mg by mouth every other day.   Yes Historical Provider, MD  doxycycline (VIBRA-TABS) 100 MG tablet Take 1 tablet (100 mg total) by mouth 2 (two) times daily. 03/03/15  Yes Orbie Pyo, MD  fentaNYL (DURAGESIC - DOSED MCG/HR) 12 MCG/HR Place 1 patch (12.5 mcg total) onto the skin every 3 (three) days. 03/18/15  Yes Srikar Sudini, MD  fluticasone (FLONASE) 50 MCG/ACT nasal spray Place 2 sprays into both nostrils daily. 03/18/15  Yes Srikar Sudini, MD  furosemide (LASIX) 40 MG tablet Take 40 mg by mouth daily.   Yes Historical Provider, MD  gabapentin (NEURONTIN) 100 MG capsule Take 100 mg by mouth 3 (three) times daily.   Yes Historical Provider, MD  gentamicin cream (GARAMYCIN) 0.1 % Apply 1 application topically as needed. 04/26/15  Yes Historical Provider, MD  hydrALAZINE (APRESOLINE) 50 MG tablet Take 50 mg by mouth 4 (four) times daily.    Yes Historical Provider, MD  hydrochlorothiazide (HYDRODIURIL) 25 MG tablet Take 1 tablet by mouth daily. 03/25/15  Yes Historical Provider, MD  lidocaine (LIDODERM) 5 % Place 1 patch onto the skin daily. Remove & Discard patch within 12 hours or as directed by MD 03/18/15  Yes Hillary Bow, MD  loratadine (CLARITIN) 10 MG tablet Take 1 tablet (10 mg total) by mouth daily. 03/18/15  Yes Srikar Sudini, MD  metaxalone (SKELAXIN) 800 MG tablet Take 800 mg by mouth every 8 (eight) hours as needed for muscle spasms.    Yes Historical Provider, MD  Multiple Vitamins-Minerals (CENTRUM SILVER PO) Take 1 tablet by mouth daily.   Yes Historical Provider, MD  Multiple Vitamins-Minerals (PRESERVISION AREDS PO) Take 1 capsule by mouth 2 (two) times daily.   Yes Historical Provider, MD  multivitamin (RENA-VIT) TABS tablet Take 1 tablet by mouth daily.   Yes Historical Provider, MD  naproxen (NAPROSYN) 500 MG tablet Take 1 tablet by mouth 2 (two) times daily. 03/20/15  Yes Historical Provider, MD  pantoprazole (PROTONIX)  40 MG tablet Take 1 tablet by mouth 2 (two) times daily. 03/13/15  Yes Historical Provider, MD  phenytoin (DILANTIN) 100 MG ER capsule Take 300 mg by mouth at bedtime.    Yes Historical Provider, MD  predniSONE (DELTASONE) 10 MG tablet Take 10 mg by mouth daily.   Yes Historical Provider, MD  ranitidine (ZANTAC) 150 MG tablet Take 150 mg by mouth 2 (two) times daily.   Yes Historical Provider, MD  sodium bicarbonate 650 MG tablet Take 1,300 mg by mouth 2 (two) times daily.   Yes Historical Provider,  MD    REVIEW OF SYSTEMS:  Review of Systems  Constitutional: Negative for fever, chills, weight loss and malaise/fatigue.  HENT: Negative for ear pain, hearing loss and tinnitus.   Eyes: Negative for blurred vision, double vision, pain and redness.  Respiratory: Positive for shortness of breath (mild). Negative for cough and hemoptysis.   Cardiovascular: Negative for chest pain, palpitations, orthopnea and leg swelling.  Gastrointestinal: Negative for nausea, vomiting, abdominal pain, diarrhea and constipation.  Genitourinary: Negative for dysuria, frequency and hematuria.  Musculoskeletal: Negative for back pain, joint pain and neck pain.  Skin:       No acne, rash, or lesions  Neurological: Positive for headaches. Negative for dizziness, tremors, focal weakness and weakness.  Endo/Heme/Allergies: Negative for polydipsia. Does not bruise/bleed easily.  Psychiatric/Behavioral: Negative for depression. The patient is not nervous/anxious and does not have insomnia.      VITAL SIGNS:   Filed Vitals:   05/08/15 0100 05/08/15 0105 05/08/15 0130 05/08/15 0134  BP: 206/74 206/74 208/64   Pulse: 88 86 88 90  Temp:      TempSrc:      Resp: _0 32  Height:      Weight:      SpO2: 93% 91% 87% 96%   Wt Readings from Last 3 Encounters:  05/07/15 117.935 kg (260 lb)  04/12/15 92.08 kg (203 lb)  04/01/15 92.08 kg (203 lb)    PHYSICAL EXAMINATION:  Physical Exam  Vitals  reviewed. Constitutional: She is oriented to person, place, and time. She appears well-developed and well-nourished. No distress.  HENT:  Head: Normocephalic and atraumatic.  Mouth/Throat: Oropharynx is clear and moist.  Eyes: Conjunctivae and EOM are normal. Pupils are equal, round, and reactive to light. No scleral icterus.  Neck: Normal range of motion. Neck supple. No JVD present. No thyromegaly present.  Cardiovascular: Normal rate, regular rhythm and intact distal pulses.  Exam reveals no gallop and no friction rub.   No murmur heard. Respiratory: Effort normal and breath sounds normal. No respiratory distress. She has no wheezes. She has no rales.  GI: Soft. Bowel sounds are normal. She exhibits no distension. There is no tenderness.  Musculoskeletal: Normal range of motion. She exhibits no edema.  No arthritis, no gout  Lymphadenopathy:    She has no cervical adenopathy.  Neurological: She is alert and oriented to person, place, and time. No cranial nerve deficit.  No dysarthria, no aphasia  Skin: Skin is warm and dry. No rash noted. No erythema.  Psychiatric: She has a normal mood and affect. Her behavior is normal. Judgment and thought content normal.    LABORATORY PANEL:   CBC  Recent Labs Lab 05/07/15 2256  WBC 9.6  HGB 8.7*  HCT 25.7*  PLT 294   ------------------------------------------------------------------------------------------------------------------  Chemistries   Recent Labs Lab 05/07/15 2256  NA 139  K 4.5  CL 101  CO2 28  GLUCOSE 126*  BUN 43*  CREATININE 2.96*  CALCIUM 9.4   ------------------------------------------------------------------------------------------------------------------  Cardiac Enzymes  Recent Labs Lab 05/07/15 2256  TROPONINI 0.11*   ------------------------------------------------------------------------------------------------------------------  RADIOLOGY:  Ct Head Wo Contrast  05/08/2015   CLINICAL DATA:   Acute onset of high blood pressure. Mild headache. Initial encounter.  EXAM: CT HEAD WITHOUT CONTRAST  TECHNIQUE: Contiguous axial images were obtained from the base of the skull through the vertex without intravenous contrast.  COMPARISON:  CT of the head performed 03/15/2015  FINDINGS: There is no evidence of acute infarction, mass lesion,  or intra- or extra-axial hemorrhage on CT.  Mild periventricular white matter change likely reflects small vessel ischemic microangiopathy. A small chronic lacunar infarct is noted at the right basal ganglia.  The posterior fossa, including the cerebellum, brainstem and fourth ventricle, is within normal limits. The third and lateral ventricles are unremarkable in appearance. The cerebral hemispheres are symmetric in appearance, with normal gray-white differentiation. No mass effect or midline shift is seen.  There is no evidence of fracture; visualized osseous structures are unremarkable in appearance. The orbits are within normal limits. The paranasal sinuses and mastoid air cells are well-aerated. No significant soft tissue abnormalities are seen.  IMPRESSION: 1. No acute intracranial pathology seen on CT. 2. Mild small vessel ischemic microangiopathy. Small chronic lacunar infarct at the right basal ganglia.   Electronically Signed   By: Garald Balding M.D.   On: 05/08/2015 01:32    EKG:   Orders placed or performed during the hospital encounter of 05/07/15  . EKG 12-Lead  . EKG 12-Lead    IMPRESSION AND PLAN:  Principal Problem:   Accelerated hypertension - we'll continue her home medicines, as well as IV antihypertensives to bring her blood pressure down. Initial goal will be less than 497 systolic within the first 4-6 hours. Subsequently a goal will be to lower her blood pressure less than 160/100. Active Problems:   Elevated troponin - likely due to demand ischemia and strain in the setting of prolonged accelerated hypertension. Trend serially. Can consider  consult and cardiology of her enzymes rise further.   CKD (chronic kidney disease), stage IV (H[facility]) - monitor closely, stable at this time, avoid nephrotoxins.   Chronic CHF (congestive heart failure) (HCC) - BNP elevated, the patient does not seem to be in overt acute failure at this time. She does have significant bilateral lower extremity edema. We will order an echocardiogram for further evaluation.   GERD (gastroesophageal reflux disease) - equivalent home dose PPI   Epilepsy ([place]) - continue home dose antiepileptics, check Dilantin level.  All the records are reviewed and case discussed with ED provider. Management plans discussed with the patient and/or family.  DVT PROPHYLAXIS: SubQ heparin  ADMISSION STATUS: Observation  CODE STATUS: Full  TOTAL TIME TAKING CARE OF THIS PATIENT: 45 minutes.    Cathey [place]enburg [place] 05/08/2015, 2:31 AM  [facility]  Office  [phone]  CC: Primary care physician; Albina Billet, MD

## 2015-05-08 NOTE — Progress Notes (Signed)
Pt c/o 3/10  chest pain- vss- no changes on tele-MD paged to make aware- nitro ordered...will conitnue to monitor.

## 2015-05-08 NOTE — Progress Notes (Signed)
Initial Nutrition Assessment   INTERVENTION:   Meals and Snacks: Cater to patient preferences; pt may benefit from 2g Na diet order with fluid restriction as pt without h/o ESRD. Will send meats chopped per pt request as RD notes with h/o dysphagia; pt reports not having difficulty with thin liquids at this time. Coordination of Care: if pt at risk for aspiration, recommend SLP evaulation Coordination of Care: Recommend accurate weight RN Shanda Bumps agreeable when pt gets up to bedside commode to zero bed and reweigh. Also recommend daily weights. Medical Food Supplement Therapy: will recommend Ensure Enlive po BID, each supplement provides 350 kcal and 20 grams of protein   NUTRITION DIAGNOSIS:   Inadequate oral intake related to acute illness as evidenced by per patient/family report.  GOAL:   Patient will meet greater than or equal to 90% of their needs  MONITOR:    (Energy Intake, Electrolyte and renal Profile, Anthropometrics)  REASON FOR ASSESSMENT:   Malnutrition Screening Tool    ASSESSMENT:   Pt admitted with headache and high BP secondary to HTN.   Past Medical History  Diagnosis Date  . Muscle weakness   . Chronic respiratory failure (HCC)   . Pneumonia   . Sleep apnea   . CHF (congestive heart failure) (H[facility])   . CKD (chronic kidney disease), stage IV (H[facility])   . Dysphagia   . GERD (gastroesophageal reflux disease)   . Back pain   . Epilepsy ([facility])   . Hypertension   . Stroke ([facility])   . Headache   . Arthritis   . Anemia     iron deficiency anemia  . Complication of anesthesia     difficult waking up after surgery (2013)    Diet Order:  Diet renal with fluid restriction Fluid restriction:: 1200 mL Fluid; Room service appropriate?: Yes; Fluid consistency:: Thin    Current Nutrition: Pt ate oatmeal this am from breakfast tray only with some coffee  Food/Nutrition-Related History: Pt reports usual intake of 2 meals per day with Ensure  daily.   Medications: lasix, protonix, sodium bicarbonate, NTG  Electrolyte/Renal Profile and Glucose Profile:   Recent Labs Lab 05/07/15 2256  NA 139  K 4.5  CL 101  CO2 28  BUN 43*  CREATININE 2.96*  CALCIUM 9.4  GLUCOSE 126*   Protein Profile: No results for input(s): ALBUMIN in the last 168 hours.  Gastrointestinal Profile: Last BM:  05/06/2015   Nutrition-Focused Physical Exam Findings: Nutrition-Focused physical exam completed. Findings are no fat depletion, no muscle depletion, and 4+ edema of lower extremities per Nsg documentation.    Weight Change: Pt reports 40lbs weight loss while admitted at [facility] facility which pt reports discharging from [place] in April. Pt reports not knowing her weight since. Pt weighed on visit at 214.5lbs with 3 pillows on the bed, however not 260lbs as currently recorded. Pt weight stable per CHL otherwise   Skin:  Reviewed, no issues   Height:   Ht Readings from Last 1 Encounters:  05/07/15  (1.626 m)    Weight:   Wt Readings from Last 1 Encounters:  05/07/15 260 lb (117.935 kg)   Wt Readings from Last 10 Encounters:  05/07/15 260 lb (117.935 kg)  04/12/15 203 lb (92.08 kg)  04/01/15 203 lb (92.08 kg)  03/16/15 207 lb 12.8 oz (94.257 kg)  03/03/15 205 lb (92.987 kg)  02/02/15 208 lb (94.348 kg)  01/09/15 208 lb (94.348 kg)  12/27/14 208 lb (94.348 kg)  12/07/14 218  lb (98.884 kg)    Ideal Body Weight:   54.6kg  BMI:  Body mass index is 44.61 kg/(m^2).  Estimated Nutritional Needs:   Kcal:  using IBW of 54.6kg, BEE: 951kcals, TEE: (IF 1.1-1.3)(AF 1.2) 1255-1484kcals  Protein:  55-65g protein (1.0-1.2g/kg)  Fluid:  1365-1667mL of fluid (25-63mL/kg)     EDUCATION NEEDS:   No education needs identified at this time   MODERATE Care Level  Leda Quail, RD, LDN Pager [phone]

## 2015-05-08 NOTE — Progress Notes (Signed)
*  PRELIMINARY RESULTS* Echocardiogram 2D Echocardiogram has been performed.  Georgann Housekeeper [place] 05/08/2015, 8:55 AM

## 2015-05-08 NOTE — ED Notes (Signed)
MD at bedside. 

## 2015-05-08 NOTE — Progress Notes (Signed)
   05/08/15 1100  Clinical Encounter Type  Visited With Patient  Visit Type Initial  Referral From Nurse  Consult/Referral To Chaplain  Spiritual Encounters  Spiritual Needs Prayer  Stress Factors  Patient Stress Factors Exhausted;Health changes  Met w/patient and provided pastoral care & prayer. Chap. Elleana Stillson G. [place]

## 2015-05-09 ENCOUNTER — Observation Stay: Payer: Medicare Other

## 2015-05-09 DIAGNOSIS — G4733 Obstructive sleep apnea (adult) (pediatric): Secondary | ICD-10-CM | POA: Diagnosis not present

## 2015-05-09 DIAGNOSIS — J9610 Chronic respiratory failure, unspecified whether with hypoxia or hypercapnia: Secondary | ICD-10-CM | POA: Diagnosis not present

## 2015-05-09 DIAGNOSIS — M1990 Unspecified osteoarthritis, unspecified site: Secondary | ICD-10-CM | POA: Diagnosis not present

## 2015-05-09 DIAGNOSIS — Z8249 Family history of ischemic heart disease and other diseases of the circulatory system: Secondary | ICD-10-CM | POA: Diagnosis not present

## 2015-05-09 DIAGNOSIS — G40909 Epilepsy, unspecified, not intractable, without status epilepticus: Secondary | ICD-10-CM | POA: Diagnosis not present

## 2015-05-09 DIAGNOSIS — H409 Unspecified glaucoma: Secondary | ICD-10-CM | POA: Diagnosis not present

## 2015-05-09 DIAGNOSIS — M549 Dorsalgia, unspecified: Secondary | ICD-10-CM | POA: Diagnosis not present

## 2015-05-09 DIAGNOSIS — Z79899 Other long term (current) drug therapy: Secondary | ICD-10-CM | POA: Diagnosis not present

## 2015-05-09 DIAGNOSIS — M25551 Pain in right hip: Secondary | ICD-10-CM | POA: Diagnosis not present

## 2015-05-09 DIAGNOSIS — R7989 Other specified abnormal findings of blood chemistry: Secondary | ICD-10-CM | POA: Diagnosis present

## 2015-05-09 DIAGNOSIS — Z888 Allergy status to other drugs, medicaments and biological substances status: Secondary | ICD-10-CM | POA: Diagnosis not present

## 2015-05-09 DIAGNOSIS — L8990 Pressure ulcer of unspecified site, unspecified stage: Secondary | ICD-10-CM | POA: Diagnosis not present

## 2015-05-09 DIAGNOSIS — I132 Hypertensive heart and chronic kidney disease with heart failure and with stage 5 chronic kidney disease, or end stage renal disease: Secondary | ICD-10-CM | POA: Diagnosis present

## 2015-05-09 DIAGNOSIS — Z9889 Other specified postprocedural states: Secondary | ICD-10-CM | POA: Diagnosis not present

## 2015-05-09 DIAGNOSIS — Z96653 Presence of artificial knee joint, bilateral: Secondary | ICD-10-CM | POA: Diagnosis not present

## 2015-05-09 DIAGNOSIS — R809 Proteinuria, unspecified: Secondary | ICD-10-CM | POA: Diagnosis not present

## 2015-05-09 DIAGNOSIS — Z823 Family history of stroke: Secondary | ICD-10-CM | POA: Diagnosis not present

## 2015-05-09 DIAGNOSIS — I5032 Chronic diastolic (congestive) heart failure: Secondary | ICD-10-CM | POA: Diagnosis not present

## 2015-05-09 DIAGNOSIS — N2581 Secondary hyperparathyroidism of renal origin: Secondary | ICD-10-CM | POA: Diagnosis not present

## 2015-05-09 DIAGNOSIS — E872 Acidosis: Secondary | ICD-10-CM | POA: Diagnosis not present

## 2015-05-09 DIAGNOSIS — K219 Gastro-esophageal reflux disease without esophagitis: Secondary | ICD-10-CM | POA: Diagnosis not present

## 2015-05-09 DIAGNOSIS — Z807 Family history of other malignant neoplasms of lymphoid, hematopoietic and related tissues: Secondary | ICD-10-CM | POA: Diagnosis not present

## 2015-05-09 DIAGNOSIS — N186 End stage renal disease: Secondary | ICD-10-CM | POA: Diagnosis not present

## 2015-05-09 DIAGNOSIS — Z885 Allergy status to narcotic agent status: Secondary | ICD-10-CM | POA: Diagnosis not present

## 2015-05-09 DIAGNOSIS — Z9049 Acquired absence of other specified parts of digestive tract: Secondary | ICD-10-CM | POA: Diagnosis not present

## 2015-05-09 DIAGNOSIS — Z992 Dependence on renal dialysis: Secondary | ICD-10-CM | POA: Diagnosis not present

## 2015-05-09 DIAGNOSIS — Z7982 Long term (current) use of aspirin: Secondary | ICD-10-CM | POA: Diagnosis not present

## 2015-05-09 DIAGNOSIS — Z7401 Bed confinement status: Secondary | ICD-10-CM | POA: Diagnosis not present

## 2015-05-09 DIAGNOSIS — E1122 Type 2 diabetes mellitus with diabetic chronic kidney disease: Secondary | ICD-10-CM | POA: Diagnosis not present

## 2015-05-09 DIAGNOSIS — G8929 Other chronic pain: Secondary | ICD-10-CM | POA: Diagnosis not present

## 2015-05-09 DIAGNOSIS — I69354 Hemiplegia and hemiparesis following cerebral infarction affecting left non-dominant side: Secondary | ICD-10-CM | POA: Diagnosis not present

## 2015-05-09 DIAGNOSIS — I2510 Atherosclerotic heart disease of native coronary artery without angina pectoris: Secondary | ICD-10-CM | POA: Diagnosis not present

## 2015-05-09 DIAGNOSIS — Z87891 Personal history of nicotine dependence: Secondary | ICD-10-CM | POA: Diagnosis not present

## 2015-05-09 DIAGNOSIS — M5430 Sciatica, unspecified side: Secondary | ICD-10-CM | POA: Diagnosis not present

## 2015-05-09 DIAGNOSIS — I248 Other forms of acute ischemic heart disease: Secondary | ICD-10-CM | POA: Diagnosis not present

## 2015-05-09 DIAGNOSIS — Z7951 Long term (current) use of inhaled steroids: Secondary | ICD-10-CM | POA: Diagnosis not present

## 2015-05-09 LAB — BASIC METABOLIC PANEL
Anion gap: 9 (ref 5–15)
BUN: 41 mg/dL — ABNORMAL HIGH (ref 6–20)
CALCIUM: 9.3 mg/dL (ref 8.9–10.3)
CO2: 28 mmol/L (ref 22–32)
CREATININE: 3.01 mg/dL — AB (ref 0.44–1.00)
Chloride: 101 mmol/L (ref 101–111)
GFR calc non Af Amer: 13 mL/min — ABNORMAL LOW (ref >60)
GFR, EST AFRICAN AMERICAN: 15 mL/min — AB (ref >60)
Glucose, Bld: 92 mg/dL (ref 65–99)
Potassium: 4.1 mmol/L (ref 3.5–5.1)
SODIUM: 138 mmol/L (ref 135–145)

## 2015-05-09 MED ORDER — HYDRALAZINE HCL 50 MG PO TABS
100.0000 mg | ORAL_TABLET | Freq: Four times a day (QID) | ORAL | Status: DC
  Administered 2015-05-09 – 2015-05-15 (×17): 100 mg via ORAL
  Administered 2015-05-15: 50 mg via ORAL
  Administered 2015-05-15 – 2015-05-20 (×15): 100 mg via ORAL
  Filled 2015-05-09 (×39): qty 2

## 2015-05-09 MED ORDER — LIDOCAINE 5 % EX PTCH
1.0000 | MEDICATED_PATCH | CUTANEOUS | Status: DC
  Administered 2015-05-09 – 2015-05-20 (×12): 1 via TRANSDERMAL
  Filled 2015-05-09 (×14): qty 1

## 2015-05-09 MED ORDER — PREDNISONE 10 MG PO TABS
10.0000 mg | ORAL_TABLET | Freq: Every day | ORAL | Status: DC
  Administered 2015-05-09 – 2015-05-22 (×12): 10 mg via ORAL
  Filled 2015-05-09 (×12): qty 1

## 2015-05-09 MED ORDER — AMLODIPINE BESYLATE 10 MG PO TABS
10.0000 mg | ORAL_TABLET | Freq: Every day | ORAL | Status: DC
  Administered 2015-05-09 – 2015-05-20 (×9): 10 mg via ORAL
  Filled 2015-05-09 (×10): qty 1

## 2015-05-09 NOTE — Progress Notes (Signed)
Checked patient's oxygen requirements. At rest and on 2L [place] patient is SPO2 is 98%. On RA at rest patient desaturates to 89%. Patient feels too weak at this time to sit up or attempt to walk on RA. 2L oxygen per [place] reapplied and patient's SPO2 returned to 98%.

## 2015-05-09 NOTE — Evaluation (Signed)
Physical Therapy Evaluation Patient Details Name: Kristina Wilson MRN: [id number] DOB: 1922-11-30 Today's Date: 05/09/2015   History of Present Illness  Kristina Wilson is a 81 y.o. female with a known history of COPD, sleep apnea, CHF, chronic kidney disease, gastroesophageal reflux disease, chronic anemia, chronic back pain, episode of TIA in the past- has been gradually getting weak for last 1 month (recent hospitalization Aug 2016).  Presented to ER this admission secondary to HA and elevated BP; admitted for accelerated HTN and chest pain.  Mild elevation in troponin (peak 0.12), consistent with demand ischemia per MD.  Clinical Impression  Upon evaluation, patient alert and oriented to basic information; pleasant and cooperative as pain allows.  Currently requiring extensive assist for bed mobility and unsupported sitting balance.  Unable to maintain static, upright sitting posture due to pain in R hip; maintains L lateral lean with weight offset to L IT/hip.  Very restless, unable to tolerate position >2-3 min prior to need for return to supine.  Unable to attempt standing or OOB attempts as a result; patient rating pain 9/10 in R hip/LE.  Positioned in L sidelying to unweight hip and provide comfort with return to bed.  RN at bedside for assessment/medication as needed. Would benefit from skilled PT to address above deficits and promote optimal return to PLOF; recommend transition to STR upon discharge from acute hospitalization.     Follow Up Recommendations SNF    Equipment Recommendations       Recommendations for Other Services       Precautions / Restrictions Precautions Precautions: Fall Precaution Comments: Contact isolation Restrictions Weight Bearing Restrictions: No      Mobility  Bed Mobility Overal bed mobility: Needs Assistance;+2 for physical assistance Bed Mobility: Supine to Sit;Sit to Supine     Supine to sit: Mod assist Sit to supine: Mod assist;Max  assist;+2 for physical assistance      Transfers                 General transfer comment: unable to tolerate secondary to significant pain with transition to upright  Ambulation/Gait             General Gait Details: unable to tolerate secondary to significant pain with transition to upright  Stairs            Wheelchair Mobility    Modified Rankin (Stroke Patients Only)       Balance Overall balance assessment: Needs assistance Sitting-balance support: No upper extremity supported;Feet supported Sitting balance-Leahy Scale: Poor Sitting balance - Comments: unable to maintain static, upright sitting posture due to pain in R hip; maintains L lateral lean with weight offset to L IT/hip.  Very restless, unable to tolerate position >2-3 min prior to need for return to supine. Postural control: Left lateral lean     Standing balance comment: unable to tolerate secondary to significant pain with transition to upright                             Pertinent Vitals/Pain Pain Assessment: 0-10 Pain Score: 9  Pain Location: R hip/LE Pain Descriptors / Indicators: Aching;Grimacing;Guarding Pain Intervention(s): Limited activity within patient's tolerance;Monitored during session;Repositioned;Patient requesting pain meds-RN notified    Home Living Family/patient expects to be discharged to:: Private residence Living Arrangements: Children (daughter and grand-daughter, able to provide 24 hour sup/assist) Available Help at Discharge: Family Type of Home: House Home Access: Ramped entrance  Home Layout: Multi-level;Able to live on main level with bedroom/bathroom Home Equipment: Dan Humphreys - 2 wheels;Bedside commode      Prior Function Level of Independence: Needs assistance         Comments: Assist for bed mobility and basic transfers with RW; ambulates for very limited household distances with RW.  Does endorse progressive decline in mobility over  recent weeks/months.     Hand Dominance        Extremity/Trunk Assessment   Upper Extremity Assessment: Overall WFL for tasks assessed           Lower Extremity Assessment: Generalized weakness (grossly 3-/5 in R LE, 2+ to 3-/5 L LE (history of CVA affecting L hemi-body).  Distal edema with reddened areas bilat LEs.)         Communication   Communication: No difficulties  Cognition Arousal/Alertness: Awake/alert Behavior During Therapy: WFL for tasks assessed/performed Overall Cognitive Status: Within Functional Limits for tasks assessed                      General Comments      Exercises        Assessment/Plan    PT Assessment Patient needs continued PT services  PT Diagnosis Difficulty walking;Generalized weakness;Acute pain   PT Problem List Decreased strength;Decreased range of motion;Decreased activity tolerance;Decreased balance;Decreased mobility;Decreased coordination;Decreased safety awareness;Pain  PT Treatment Interventions DME instruction;Gait training;Stair training;Functional mobility training;Therapeutic activities;Therapeutic exercise;Balance training;Patient/family education   PT Goals (Current goals can be found in the Care Plan section) Acute Rehab PT Goals Patient Stated Goal: "I've got to lie back down" PT Goal Formulation: With patient/family Time For Goal Achievement: 05/23/15 Potential to Achieve Goals: Fair Additional Goals Additional Goal #1: Unsupported sitting balance edge of bed, mod indep, for participation with ADLs and functional activities. Additional Goal #2: Assess and establish goals for OOB as appropriate.    Frequency Min 2X/week   Barriers to discharge        Co-evaluation               End of Session   Activity Tolerance: Patient limited by pain Patient left: in bed;with call bell/phone within reach;with bed alarm set Nurse Communication: Patient requests pain meds         Time: 1610-9604 PT  Time Calculation (min) (ACUTE ONLY): 21 min   Charges:   PT Evaluation $Initial PT Evaluation Tier I: 1 Procedure     PT G Codes:        Kristina Wilson, PT, DPT, NCS 05/09/2015, 1:14 PM [phone]

## 2015-05-09 NOTE — Progress Notes (Signed)
Patient ID: Kristina Wilson, female   DOB: 1923/02/18, 81 y.o.   MRN: [id number] [facility] PROGRESS NOTE  Kristina Wilson JXB:[id number] DOB: [birth year] DOA: 05/07/2015 PCP: Jaclyn Shaggy, MD  HPI/Subjective: No complaints of chest pain today. She states that her right hip and legs are hurting but this is more chronic in nature for her. She feels that she is very weak. Her blood pressure is been very high today area  Objective: Filed Vitals:   05/09/15 0913  BP: 170/50  Pulse: 82  Temp: 98 F (36.7 C)  Resp: 18    Filed Weights   05/07/15 2251 05/09/15 0504  Weight: 117.935 kg (260 lb) 95.845 kg (211 lb 4.8 oz)    ROS: Review of Systems  Constitutional: Negative for fever and chills.  Eyes: Negative for blurred vision.  Respiratory: Negative for cough and shortness of breath.   Cardiovascular: Negative for chest pain.  Gastrointestinal: Negative for nausea, vomiting, abdominal pain, diarrhea and constipation.  Genitourinary: Negative for dysuria.  Musculoskeletal: Positive for joint pain.  Neurological: Negative for dizziness and headaches.   Exam: Physical Exam  Constitutional: She is oriented to person, place, and time.  HENT:  Nose: No mucosal edema.  Mouth/Throat: No oropharyngeal exudate or posterior oropharyngeal edema.  Eyes: Conjunctivae, EOM and lids are normal. Pupils are equal, round, and reactive to light.  Neck: No JVD present. Carotid bruit is not present. No edema present. No thyroid mass and no thyromegaly present.  Cardiovascular: S1 normal and S2 normal.  Exam reveals no gallop.   No murmur heard. Pulses:      Dorsalis pedis pulses are 2+ on the right side, and 2+ on the left side.  Respiratory: No respiratory distress. She has no wheezes. She has no rhonchi. She has no rales.  GI: Soft. Bowel sounds are normal. There is no tenderness.  Musculoskeletal:       Right ankle: She exhibits swelling.       Left ankle: She exhibits  swelling.  Lymphadenopathy:    She has no cervical adenopathy.  Neurological: She is alert and oriented to person, place, and time. No cranial nerve deficit.  Skin: Skin is warm. Nails show no clubbing.  Chronic lower extremity discoloration bilaterally  Psychiatric: She has a normal mood and affect.    Data Reviewed: Basic Metabolic Panel:  Recent Labs Lab 05/07/15 2256 05/09/15 0444  NA 139 138  K 4.5 4.1  CL 101 101  CO2 28 28  GLUCOSE 126* 92  BUN 43* 41*  CREATININE 2.96* 3.01*  CALCIUM 9.4 9.3   CBC:  Recent Labs Lab 05/07/15 2256  WBC 9.6  HGB 8.7*  HCT 25.7*  MCV 98.3  PLT 294   Cardiac Enzymes:  Recent Labs Lab 05/07/15 2256 05/08/15 0559 05/08/15 1212 05/08/15 1716  TROPONINI 0.11* 0.12* 0.08* 0.08*   BNP (last 3 results)  Recent Labs  02/02/15 2121 05/07/15 2256  BNP 228.0* 805.0*      Recent Results (from the past 240 hour(s))  MRSA PCR Screening     Status: Abnormal   Collection Time: 05/08/15  7:50 AM  Result Value Ref Range Status   MRSA by PCR POSITIVE (A) NEGATIVE Final    Comment:        The GeneXpert MRSA Assay (FDA approved for NASAL specimens only), is one component of a comprehensive MRSA colonization surveillance program. It is not intended to diagnose MRSA infection nor to guide or monitor treatment for MRSA  infections. CRITICAL RESULT CALLED TO, READ BACK BY AND VERIFIED WITH: JESSICA CHRISTMAS ON 05/08/15 AT 1022 BY QSD      Studies: Ct Head Wo Contrast  05/08/2015   CLINICAL DATA:  Acute onset of high blood pressure. Mild headache. Initial encounter.  EXAM: CT HEAD WITHOUT CONTRAST  TECHNIQUE: Contiguous axial images were obtained from the base of the skull through the vertex without intravenous contrast.  COMPARISON:  CT of the head performed 03/15/2015  FINDINGS: There is no evidence of acute infarction, mass lesion, or intra- or extra-axial hemorrhage on CT.  Mild periventricular white matter change likely  reflects small vessel ischemic microangiopathy. A small chronic lacunar infarct is noted at the right basal ganglia.  The posterior fossa, including the cerebellum, brainstem and fourth ventricle, is within normal limits. The third and lateral ventricles are unremarkable in appearance. The cerebral hemispheres are symmetric in appearance, with normal gray-white differentiation. No mass effect or midline shift is seen.  There is no evidence of fracture; visualized osseous structures are unremarkable in appearance. The orbits are within normal limits. The paranasal sinuses and mastoid air cells are well-aerated. No significant soft tissue abnormalities are seen.  IMPRESSION: 1. No acute intracranial pathology seen on CT. 2. Mild small vessel ischemic microangiopathy. Small chronic lacunar infarct at the right basal ganglia.   Electronically Signed   By: Roanna Raider M.D.   On: 05/08/2015 01:32   US Venous Img Upper Uni Left  05/08/2015   CLINICAL DATA:  Left arm swelling for 1 day. Swelling in the left lower arm.  EXAM: LEFT UPPER EXTREMITY VENOUS DOPPLER ULTRASOUND  TECHNIQUE: Gray-scale sonography with graded compression, as well as color Doppler and duplex ultrasound were performed to evaluate the upper extremity deep venous system from the level of the subclavian vein and including the jugular, axillary, basilic and upper cephalic vein. Spectral Doppler was utilized to evaluate flow at rest and with distal augmentation maneuvers.  COMPARISON:  None.  FINDINGS: Color Doppler flow and phasicity in the right subclavian vein.  Normal compressibility, color Doppler flow and phasicity in the left internal jugular vein. Normal color Doppler flow and phasicity in the left subclavian vein. The left cephalic vein is compressible with color Doppler flow and augmentation. Left axillary vein is compressible with color Doppler flow and augmentation. Normal color Doppler flow, compressibility and augmentation in the left  brachial veins. Normal compressibility, augmentation and color Doppler flow in the left basilic vein. Limited evaluation of the forearm veins but no evidence for thrombus in the left radial or ulnar veins.  Other findings:  Subcutaneous edema in the left forearm.  IMPRESSION: Negative for deep vein thrombosis in the left upper extremity.   Electronically Signed   By: Richarda Overlie M.D.   On: 05/08/2015 16:33    Scheduled Meds: . amLODipine  10 mg Oral Daily  . aspirin EC  81 mg Oral Daily  . carvedilol  6.25 mg Oral BID  . Chlorhexidine Gluconate Cloth  6 each Topical Q0600  . cloNIDine  0.3 mg Oral TID  . feeding supplement (ENSURE ENLIVE)  237 mL Oral Q24H  . fentaNYL  12.5 mcg Transdermal Q72H  . furosemide  40 mg Oral Daily  . gentamicin cream  1 application Topical Daily  . heparin  5,000 Units Subcutaneous 3 times per day  . hydrALAZINE  100 mg Oral QID  . lidocaine  1 patch Transdermal Q24H  . mupirocin ointment  1 application Nasal BID  .  nitroGLYCERIN  0.5 inch Topical 4 times per day  . pantoprazole  40 mg Oral BID  . phenytoin  300 mg Oral QHS  . sodium bicarbonate  1,300 mg Oral BID  . sodium chloride  3 mL Intravenous Q12H    Assessment/Plan:  1.   Accelerated hypertension- blood pressure back up again. I increased hydralazine 200 mg every 6 hours, added Nitropatch and changed Cardizem over to Norvasc. Also hydrochlorothiazide does not work very well with chronic kidney disease. 2.   Chest pain and borderline troponin- labeled as demand ischemia by cardiology. Conservative management 3.   Chronic pain- restart fentanyl patch 4.   Gastroesophageal reflux disease without esophagitis continue Protonix 5.   Chronic kidney disease stage IV- continue to monitor, 24 hour urine to be collected and finish today 6.   History of seizure disorder on phenytoin 7.   Weakness- physical therapy evaluation  Code Status:     Code Status Orders        Start     Ordered   05/08/15  0514  Full code   Continuous     05/08/15 0513     Family Communication: Spoke with daughter on phone Disposition Plan: We'll discharge once blood pressure is improved  Consultants:  Cardiology  Time spent: 25 minutes  [facility]  [facility]

## 2015-05-09 NOTE — Care Management Note (Signed)
Case Management Note  Patient Details  Name: Kristina Wilson MRN: [id number] Date of Birth: 1922-08-15  Subjective/Objective:                  Met with patient, [name]) Kristina Wilson and her granddaughter whom patient states she lives with. Patient is currently maximum assist whereas per Kristina Wilson she was able to ambulate short distances with a walker prior to this illness. PT is recommending SNF at this time. O2 is new. O2 sats per Kristina Wilson was 83% in [place]. I spoke with RN and asked that this be re-assessed/documented. Her PCP is Dr. Benita Stabile. She uses [facility] for [facility]. She has in the past been able to ride in a car but Kristina Wilson is concerned about that because of this weakness. Patient has a rolling walker and bedside commode in the home. Kristina Wilson states that patient was recently opened to [facility] but services are ending soon. [facility] may be necessary if patient returns home pending what agency is current with [facility] (this date it is [facility]). Dr. Leslye Peer feels that patient should be inpatient. Per Dr. Leslye Peer patient cannot tolerate Lasix due to renal function.   Action/Plan: RNCM will continue to follow. PT is recommending SNF- she is currently Medicare OBS which will not cover SNF.   Expected Discharge Date:                  Expected Discharge Plan:     In-House Referral:  Clinical Social Work  Discharge planning Services  CM Consult  Post Acute Care Choice:  Home Health Choice offered to:  Patient, Adult Children  DME Arranged:  N/A DME Agency:     HH Arranged:    Kristina Wilson Agency:     Status of Service:  In process, will continue to follow  Medicare Important Message Given:    Date Medicare IM Given:    Medicare IM give by:    Date Additional Medicare IM Given:    Additional Medicare Important Message give by:     If discussed at [place] of Stay Meetings, dates discussed:    Additional Comments:  Marshell Garfinkel, RN 05/09/2015, 2:03 PM

## 2015-05-09 NOTE — Clinical Documentation Improvement (Signed)
Internal Medicine  Can the diagnosis of CHF be further specified?    Acuity - Acute, Chronic, Acute on Chronic   Type - Systolic, Diastolic, Systolic and Diastolic  Other  Clinically Undetermined  Document any associated diagnoses/conditions  Supporting Information: (As per notes)  "Chronic CHF"  Please exercise your independent, professional judgment when responding. A specific answer is not anticipated or expected.  Please update your documentation within the medical record to reflect your response to this query.  Thank You, Nevin Bloodgood, RN, BSN, CCDS,Clinical Documentation Specialist:  [phone]  [phone]=Cell [facility]- Health Information Management

## 2015-05-09 NOTE — Progress Notes (Signed)
Spoke to MD about patient possibly needing peritoneal dialysis while in hospital. He stated that before initiating peritoneal dialysis, 24 hour urine needs to be collected and her GFR must be assessed and within criteria. I also asked about starting patient on a stool softener as she has not had a bowel movement in 3 days which is abnormal according to her. He stated he would order a stool softener.

## 2015-05-09 NOTE — Progress Notes (Signed)
Family concerned about patient receiving her daily prednisone pill. Spoke to MD and he stated to restart her at home dose of prednisone. I ordered  PO prednisone. Patient also requesting pain medication after working with physical therapy. She can not yet have more tylenol. MD made aware, he will look at her [facility] and update as appropriate for better pain control.

## 2015-05-10 LAB — BASIC METABOLIC PANEL
Anion gap: 8 (ref 5–15)
BUN: 40 mg/dL — AB (ref 6–20)
CALCIUM: 9.2 mg/dL (ref 8.9–10.3)
CHLORIDE: 98 mmol/L — AB (ref 101–111)
CO2: 29 mmol/L (ref 22–32)
CREATININE: 2.86 mg/dL — AB (ref 0.44–1.00)
GFR calc non Af Amer: 14 mL/min — ABNORMAL LOW (ref >60)
GFR, EST AFRICAN AMERICAN: 16 mL/min — AB (ref >60)
Glucose, Bld: 108 mg/dL — ABNORMAL HIGH (ref 65–99)
Potassium: 3.8 mmol/L (ref 3.5–5.1)
Sodium: 135 mmol/L (ref 135–145)

## 2015-05-10 LAB — CBC
HEMATOCRIT: 23 % — AB (ref 35.0–47.0)
HEMOGLOBIN: 7.8 g/dL — AB (ref 12.0–16.0)
MCH: 33.4 pg (ref 26.0–34.0)
MCHC: 34 g/dL (ref 32.0–36.0)
MCV: 98.2 fL (ref 80.0–100.0)
Platelets: 234 10*3/uL (ref 150–440)
RBC: 2.34 MIL/uL — ABNORMAL LOW (ref 3.80–5.20)
RDW: 12.4 % (ref 11.5–14.5)
WBC: 7.5 10*3/uL (ref 3.6–11.0)

## 2015-05-10 LAB — CREATININE CLEARANCE, URINE, 24 HOUR
CREAT CLEAR: 12 mL/min — AB (ref 75–115)
Collection Interval-CRCL: 24 hours
Creatinine, 24H Ur: 570 mg/d — ABNORMAL LOW (ref 600–1800)
Creatinine, Urine: 38 mg/dL
Urine Total Volume-CRCL: 1500 mL

## 2015-05-10 MED ORDER — OXYCODONE HCL 5 MG PO TABS
5.0000 mg | ORAL_TABLET | Freq: Three times a day (TID) | ORAL | Status: DC | PRN
  Administered 2015-05-10: 5 mg via ORAL
  Filled 2015-05-10: qty 1

## 2015-05-10 MED ORDER — ONDANSETRON HCL 4 MG/2ML IJ SOLN
4.0000 mg | Freq: Once | INTRAMUSCULAR | Status: AC
  Administered 2015-05-10: 4 mg via INTRAVENOUS
  Filled 2015-05-10: qty 2

## 2015-05-10 MED ORDER — POLYSACCHARIDE IRON COMPLEX 150 MG PO CAPS
150.0000 mg | ORAL_CAPSULE | Freq: Every day | ORAL | Status: DC
  Administered 2015-05-10 – 2015-05-22 (×12): 150 mg via ORAL
  Filled 2015-05-10 (×12): qty 1

## 2015-05-10 MED ORDER — ENSURE ENLIVE PO LIQD
237.0000 mL | Freq: Three times a day (TID) | ORAL | Status: DC
  Administered 2015-05-10 – 2015-05-17 (×11): 237 mL via ORAL

## 2015-05-10 MED ORDER — DOCUSATE SODIUM 100 MG PO CAPS
100.0000 mg | ORAL_CAPSULE | Freq: Two times a day (BID) | ORAL | Status: DC
  Administered 2015-05-10 – 2015-05-22 (×22): 100 mg via ORAL
  Filled 2015-05-10 (×24): qty 1

## 2015-05-10 MED ORDER — METHYLPREDNISOLONE SODIUM SUCC 40 MG IJ SOLR
40.0000 mg | Freq: Four times a day (QID) | INTRAMUSCULAR | Status: AC
  Administered 2015-05-10 (×2): 40 mg via INTRAVENOUS
  Filled 2015-05-10 (×2): qty 1

## 2015-05-10 MED ORDER — OXYCODONE HCL 5 MG PO TABS
5.0000 mg | ORAL_TABLET | ORAL | Status: DC | PRN
  Administered 2015-05-11 – 2015-05-22 (×27): 5 mg via ORAL
  Filled 2015-05-10 (×27): qty 1

## 2015-05-10 MED ORDER — FENTANYL 25 MCG/HR TD PT72
25.0000 ug | MEDICATED_PATCH | TRANSDERMAL | Status: DC
  Administered 2015-05-11 – 2015-05-20 (×4): 25 ug via TRANSDERMAL
  Filled 2015-05-10 (×4): qty 1

## 2015-05-10 NOTE — Progress Notes (Signed)
Patient complained of nausea. Notified Dr. Wieting. PRenae GlossD administer Zofran  IV once.

## 2015-05-10 NOTE — Progress Notes (Signed)
[facility]  ROUNDING NOTE   Subjective:   Kristina Wilson was admitted on 05/07/2015 for Elevated troponin [R79.89] Secondary hypertension, unspecified [I15.9] Congestive heart failure, unspecified congestive heart failure chronicity, unspecified congestive heart failure type (HCC) [I50.9]   Patient had 24 hour urine creatinine clearance 12.   Daughter at bedside. Now on furosemide  po. Volume status has improved.   Objective:  Vital signs in last 24 hours:  Temp:  [98.3 F (36.8 C)-98.6 F (37 C)] 98.6 F (37 C) (10/07 1147) Pulse Rate:  [76-90] 90 (10/07 1601) Resp:  [16-18] 18 (10/07 1147) BP: (138-182)/(47-62) 175/53 mmHg (10/07 1601) SpO2:  [94 %-99 %] 94 % (10/07 1147) Weight:  [95.618 kg (210 lb 12.8 oz)] 95.618 kg (210 lb 12.8 oz) (10/07 1191)  Weight change: -0.227 kg (-8 oz) Filed Weights   05/07/15 2251 05/09/15 0504 05/10/15 0637  Weight: 117.935 kg (260 lb) 95.845 kg (211 lb 4.8 oz) 95.618 kg (210 lb 12.8 oz)    Intake/Output: I/O last 3 completed shifts: In: -  Out: 350 [Urine:350]   Intake/Output this shift:  Total I/O In: 3 [I.V.:3] Out: 0   Physical Exam: General: NAD  Head: Normocephalic, atraumatic. Moist oral mucosal membranes  Eyes: Anicteric, PERRL  Neck: Supple, trachea midline  Lungs:  Clear to auscultation  Heart: Regular rate and rhythm  Abdomen:  Soft, nontender, +peritoneal dialysis catheter  Extremities: trace peripheral edema.  Neurologic: Nonfocal, moving all four extremities  Skin: No lesions  Access: PD catheter     Basic Metabolic Panel:  Recent Labs Lab 05/07/15 2256 05/09/15 0444 05/10/15 0426  NA 139 138 135  K 4.5 4.1 3.8  CL 101 101 98*  CO2 GLUCOSE 126* 92 108*  BUN 43* 41* 40*  CREATININE 2.96* 3.01* 2.86*  CALCIUM 9.4 9.3 9.2    Liver Function Tests: No results for input(s): AST, ALT, ALKPHOS, BILITOT, PROT, ALBUMIN in the last 168 hours. No results for input(s): LIPASE, AMYLASE  in the last 168 hours. No results for input(s): AMMONIA in the last 168 hours.  CBC:  Recent Labs Lab 05/07/15 2256 05/10/15 0426  WBC 9.6 7.5  HGB 8.7* 7.8*  HCT 25.7* 23.0*  MCV 98.3 98.2  PLT 294 234    Cardiac Enzymes:  Recent Labs Lab 05/07/15 2256 05/08/15 0559 05/08/15 1212 05/08/15 1716  TROPONINI 0.11* 0.12* 0.08* 0.08*    BNP: Invalid input(s): POCBNP  CBG: No results for input(s): GLUCAP in the last 168 hours.  Microbiology: Results for orders placed or performed during the hospital encounter of 05/07/15  MRSA PCR Screening     Status: Abnormal   Collection Time: 05/08/15  7:50 AM  Result Value Ref Range Status   MRSA by PCR POSITIVE (A) NEGATIVE Final    Comment:        The GeneXpert MRSA Assay (FDA approved for NASAL specimens only), is one component of a comprehensive MRSA colonization surveillance program. It is not intended to diagnose MRSA infection nor to guide or monitor treatment for MRSA infections. CRITICAL RESULT CALLED TO, READ BACK BY AND VERIFIED WITH: JESSICA CHRISTMAS ON 05/08/15 AT 1022 BY QSD     Coagulation Studies: No results for input(s): LABPROT, INR in the last 72 hours.  Urinalysis: No results for input(s): COLORURINE, LABSPEC, PHURINE, GLUCOSEU, HGBUR, BILIRUBINUR, KETONESUR, PROTEINUR, UROBILINOGEN, NITRITE, LEUKOCYTESUR in the last 72 hours.  Invalid input(s): APPERANCEUR    Imaging: Dg Chest Port 1 View  05/09/2015  CLINICAL DATA:  Hypertension and hypoxia  EXAM: PORTABLE CHEST - 1 VIEW  COMPARISON:  03/16/2015  FINDINGS: Persistent left basilar changes are noted with associated effusion. Cardiomegaly is again noted. Aortic calcifications are seen. The right lung remains clear.  IMPRESSION: Stable appearance of the chest with left basilar atelectasis and associated left effusion.   Electronically Signed   By: Alcide Clever M.D.   On: 05/09/2015 15:49     Medications:     . amLODipine  10 mg Oral Daily  .  aspirin EC  81 mg Oral Daily  . carvedilol  6.25 mg Oral BID  . Chlorhexidine Gluconate Cloth  6 each Topical Q0600  . cloNIDine  0.3 mg Oral TID  . docusate sodium  100 mg Oral BID  . feeding supplement (ENSURE ENLIVE)  237 mL Oral TID WC  . [START ON 05/11/2015] fentaNYL  25 mcg Transdermal Q72H  . furosemide  40 mg Oral Daily  . gentamicin cream  1 application Topical Daily  . heparin  5,000 Units Subcutaneous 3 times per day  . hydrALAZINE  100 mg Oral QID  . iron polysaccharides  150 mg Oral Daily  . lidocaine  1 patch Transdermal Q24H  . methylPREDNISolone (SOLU-MEDROL) injection  40 mg Intravenous Q6H  . mupirocin ointment  1 application Nasal BID  . nitroGLYCERIN  0.5 inch Topical 4 times per day  . pantoprazole  40 mg Oral BID  . phenytoin  300 mg Oral QHS  . predniSONE  10 mg Oral Q breakfast  . sodium bicarbonate  1,300 mg Oral BID  . sodium chloride  3 mL Intravenous Q12H   acetaminophen, hydrALAZINE, labetalol, nitroGLYCERIN, oxyCODONE  Assessment/ Plan:  Kristina Wilson is a 81 y.o. white female with past medical history of hypertension, obstructive sleep apnea, DJD, CVA with residual left sided weakness, carotid stenosis status post L CEA 2/15, seizure disorder, diastolic congestive heart failure, anemia  1. Chronic kidney disease stage V with proteinuria: Chronic kidney disease stage V remains stable. However with volume status has brought patient to hospital.  Patient and patient's family is to decide if she should get hemodialysis or wait for training for peritoneal. The problem arises that she cannot get training for peritoneal dialysis while at skilled nursing facility. So the options are:  A.  either start hemodialysis now and prevent another similar hospitalization and then start peritoneal dialysis when patient gets home. Or  B. Discharge patient to skilled nursing facility. And when patient goes home, start peritoneal dialysis training.  - No acute  indication for starting dialysis at this time.  - continue sodium bicarbonate for metabolic acidosis - renally dose all medications  2. Hypertension. Blood pressure elevated, volume and pain driven - home regimen of carvedilol, clonidine, diltiazem, furosemide hydralazine - now also on amlodipine  3. Secondary hyperparathyroidism. PTH 143 on 9/16. Caclium at goal - phos elevated on 9/16 at 5.6. Will check today.   4. Anemia chronic kidney disease: hemoglobin 7.8. Has not received epo due to CVA.    LOS: 1 KOLLURU, SARATH 10/7/20164:38 PM

## 2015-05-10 NOTE — Op Note (Signed)
  OPERATIVE NOTE   PROCEDURE: 1. Ultrasound guidance for vascular access right femoral vein 2. Placement of a 30 cm triple lumen dialysis catheter right femoral vein  PRE-OPERATIVE DIAGNOSIS: 1. Renal failure requiring dialysis, likely ESRD  POST-OPERATIVE DIAGNOSIS: Same  SURGEON: Festus Barren, MD  ASSISTANT(S): None  ANESTHESIA: local  ESTIMATED BLOOD LOSS: Minimal   FINDING(S): 1. None  SPECIMEN(S): None  INDICATIONS:  Patient is a 81 yo WF who presents with multiple issues and now needs to start HD for renal failure.  Risks and benefits were discussed, and informed consent was obtained..  DESCRIPTION: After obtaining full informed written consent, the patient was laid flat in the bed. The right groin was sterilely prepped and draped in a sterile surgical field was created. The right femoral vein was visualized with ultrasound and found to be widely patent. It was then accessed under direct guidance without difficulty with a Seldinger needle and a permanent image was recorded. A J-wire was then placed. After skin nick and dilatation, a 30 cm Trialysis dialysis catheter was placed over the wire and the wire was removed. The lumens withdrew dark red nonpulsatile blood and flushed easily with sterile saline. The catheter was secured to the skin with 3 nylon sutures. Sterile dressing was placed.  COMPLICATIONS: None  CONDITION: Stable  DEW,JASON 05/10/2015 6:38 PM

## 2015-05-10 NOTE — Progress Notes (Signed)
Patient in bed resting, family at bedside. No signs of nausea.

## 2015-05-10 NOTE — Progress Notes (Signed)
Patient ID: Kristina Wilson, female   DOB: 08/12/1922, 81 y.o.   MRN: [id number] [facility] PROGRESS NOTE  Kristina Wilson:[id number] DOB: July 19, 1923 DOA: 05/07/2015 PCP: Jaclyn Shaggy, MD  HPI/Subjective: Patient still having a lot of back pain. Difficulty moving her right leg. She has allergy to a lot of pain medication. The daughter thinks that she can take oxycodone even though it's listed as an allergy.  Objective: Filed Vitals:   05/10/15 1147  BP: 147/50  Pulse: 81  Temp: 98.6 F (37 C)  Resp: 18    Filed Weights   05/07/15 2251 05/09/15 0504 05/10/15 0637  Weight: 117.935 kg (260 lb) 95.845 kg (211 lb 4.8 oz) 95.618 kg (210 lb 12.8 oz)    ROS: Review of Systems  Constitutional: Negative for fever and chills.  Eyes: Negative for blurred vision.  Respiratory: Negative for cough and shortness of breath.   Cardiovascular: Negative for chest pain.  Gastrointestinal: Negative for nausea, vomiting, abdominal pain, diarrhea and constipation.  Genitourinary: Negative for dysuria.  Musculoskeletal: Positive for joint pain.  Neurological: Negative for dizziness and headaches.   Exam: Physical Exam  Constitutional: She is oriented to person, place, and time.  HENT:  Nose: No mucosal edema.  Mouth/Throat: No oropharyngeal exudate or posterior oropharyngeal edema.  Eyes: Conjunctivae, EOM and lids are normal. Pupils are equal, round, and reactive to light.  Neck: No JVD present. Carotid bruit is not present. No edema present. No thyroid mass and no thyromegaly present.  Cardiovascular: S1 normal and S2 normal.  Exam reveals no gallop.   No murmur heard. Pulses:      Dorsalis pedis pulses are 2+ on the right side, and 2+ on the left side.  Respiratory: No respiratory distress. She has no wheezes. She has no rhonchi. She has no rales.  GI: Soft. Bowel sounds are normal. There is no tenderness.  Musculoskeletal:       Right ankle: She exhibits swelling.        Left ankle: She exhibits swelling.  Pain in the right sacroiliac area and over lumbar spine to palpation.  Lymphadenopathy:    She has no cervical adenopathy.  Neurological: She is alert and oriented to person, place, and time. No cranial nerve deficit.  Patient barely able to straight leg raise.  Skin: Skin is warm. Nails show no clubbing.  Chronic lower extremity discoloration bilaterally  Psychiatric: She has a normal mood and affect.    Data Reviewed: Basic Metabolic Panel:  Recent Labs Lab 05/07/15 2256 05/09/15 0444 05/10/15 0426  NA 139 138 135  K 4.5 4.1 3.8  CL 101 101 98*  CO2 28 28 29   GLUCOSE 126* 92 108*  BUN 43* 41* 40*  CREATININE 2.96* 3.01* 2.86*  CALCIUM 9.4 9.3 9.2   CBC:  Recent Labs Lab 05/07/15 2256 05/10/15 0426  WBC 9.6 7.5  HGB 8.7* 7.8*  HCT 25.7* 23.0*  MCV 98.3 98.2  PLT 294 234   Cardiac Enzymes:  Recent Labs Lab 05/07/15 2256 05/08/15 0559 05/08/15 1212 05/08/15 1716  TROPONINI 0.11* 0.12* 0.08* 0.08*   BNP (last 3 results)  Recent Labs  02/02/15 2121 05/07/15 2256  BNP 228.0* 805.0*      Recent Results (from the past 240 hour(s))  MRSA PCR Screening     Status: Abnormal   Collection Time: 05/08/15  7:50 AM  Result Value Ref Range Status   MRSA by PCR POSITIVE (A) NEGATIVE Final    Comment:  The GeneXpert MRSA Assay (FDA approved for NASAL specimens only), is one component of a comprehensive MRSA colonization surveillance program. It is not intended to diagnose MRSA infection nor to guide or monitor treatment for MRSA infections. CRITICAL RESULT CALLED TO, READ BACK BY AND VERIFIED WITH: JESSICA CHRISTMAS ON 05/08/15 AT 1022 BY QSD      Studies: US Venous Img Upper Uni Left  05/08/2015   CLINICAL DATA:  Left arm swelling for 1 day. Swelling in the left lower arm.  EXAM: LEFT UPPER EXTREMITY VENOUS DOPPLER ULTRASOUND  TECHNIQUE: Gray-scale sonography with graded compression, as well as color  Doppler and duplex ultrasound were performed to evaluate the upper extremity deep venous system from the level of the subclavian vein and including the jugular, axillary, basilic and upper cephalic vein. Spectral Doppler was utilized to evaluate flow at rest and with distal augmentation maneuvers.  COMPARISON:  None.  FINDINGS: Color Doppler flow and phasicity in the right subclavian vein.  Normal compressibility, color Doppler flow and phasicity in the left internal jugular vein. Normal color Doppler flow and phasicity in the left subclavian vein. The left cephalic vein is compressible with color Doppler flow and augmentation. Left axillary vein is compressible with color Doppler flow and augmentation. Normal color Doppler flow, compressibility and augmentation in the left brachial veins. Normal compressibility, augmentation and color Doppler flow in the left basilic vein. Limited evaluation of the forearm veins but no evidence for thrombus in the left radial or ulnar veins.  Other findings:  Subcutaneous edema in the left forearm.  IMPRESSION: Negative for deep vein thrombosis in the left upper extremity.   Electronically Signed   By: Richarda Overlie M.D.   On: 05/08/2015 16:33   Dg Chest Port 1 View  05/09/2015   CLINICAL DATA:  Hypertension and hypoxia  EXAM: PORTABLE CHEST - 1 VIEW  COMPARISON:  03/16/2015  FINDINGS: Persistent left basilar changes are noted with associated effusion. Cardiomegaly is again noted. Aortic calcifications are seen. The right lung remains clear.  IMPRESSION: Stable appearance of the chest with left basilar atelectasis and associated left effusion.   Electronically Signed   By: Alcide Clever M.D.   On: 05/09/2015 15:49    Scheduled Meds: . amLODipine  10 mg Oral Daily  . aspirin EC  81 mg Oral Daily  . carvedilol  6.25 mg Oral BID  . Chlorhexidine Gluconate Cloth  6 each Topical Q0600  . cloNIDine  0.3 mg Oral TID  . feeding supplement (ENSURE ENLIVE)  237 mL Oral Q24H  .  [START ON 05/11/2015] fentaNYL  25 mcg Transdermal Q72H  . furosemide  40 mg Oral Daily  . gentamicin cream  1 application Topical Daily  . heparin  5,000 Units Subcutaneous 3 times per day  . hydrALAZINE  100 mg Oral QID  . iron polysaccharides  150 mg Oral Daily  . lidocaine  1 patch Transdermal Q24H  . methylPREDNISolone (SOLU-MEDROL) injection  40 mg Intravenous Q6H  . mupirocin ointment  1 application Nasal BID  . nitroGLYCERIN  0.5 inch Topical 4 times per day  . pantoprazole  40 mg Oral BID  . phenytoin  300 mg Oral QHS  . predniSONE  10 mg Oral Q breakfast  . sodium bicarbonate  1,300 mg Oral BID  . sodium chloride  3 mL Intravenous Q12H    Assessment/Plan:  1.   Accelerated hypertension- blood pressure trending better with changes to medications yesterday. 2.   Back pain- I will  give a few doses of IV Solu-Medrol. Trial of oxycodone. Increase fentanyl patch. Patient wanted to hold off on imaging studies at this point. 3.   Chest pain and borderline troponin- labeled as demand ischemia by cardiology. Conservative management 4.   Chronic pain- increased fentanyl patch 5.   Gastroesophageal reflux disease without esophagitis continue Protonix 6.   Chronic kidney disease stage IV- continue to monitor, 24 hour urine creatinine clearance measured at 12. I spoke with Dr. Luther Redo to see in consultation about the possibility of starting peritoneal dialysis 6.   History of seizure disorder on phenytoin 7.   Weakness- physical therapy evaluation, likely will need rehabilitation. Potential disposition over the weekend.  Code Status:     Code Status Orders        Start     Ordered   05/08/15 0514  Full code   Continuous     05/08/15 0513     Family Communication: Spoke with daughter and granddaughter at the bedside Disposition Plan: Likely will need rehabilitation. 3 day hospital stay will be over the weekend.  Consultants:  Cardiology  Nephrology  Time spent: 25  minutes  Madlock Highland  [facility]

## 2015-05-10 NOTE — Progress Notes (Signed)
Removed oxycodone from patient's allergies list per patient request. Patient tolerated PRN dose of oxycodone.

## 2015-05-10 NOTE — Progress Notes (Signed)
Femoral hemo catheter bleeding since the beginning of shift. Dressing changed twice. Guaze added for reenforcement. MD notified. Acknowledged and new orders given. Will continue to monitor.

## 2015-05-10 NOTE — Progress Notes (Signed)
Checked available SNF days in current benefit period.  Per [employer] [phone]), patient has used all full SNF days and has 31 co-SNF days available.  Last billing date was 01/19/15.  Checked [employer] SNF benefit as well.  [employer] will pick up patient's liability after Medicare pays during days 1-100.  For days 101+, T[employer] becomes primary and patient would be responsible for 25% co-insurance for each day and for professional fees.  Auth would be required for days 101 onward, as well as medical records and the Medicare exhaust date.

## 2015-05-10 NOTE — Clinical Social Work Note (Signed)
Clinical Social Work Assessment  Patient Details  Name: Kristina Wilson MRN: [id number] Date of Birth: 19-Aug-1922  Date of referral:  05/10/15               Reason for consult:  Facility Placement                Permission sought to share information with:  Facility Medical sales representative, Family Supports Permission granted to share information::  Yes, Verbal Permission Granted  Name::      (Daughter Tresa Endo  [phone] cell or [phone] hm)  Agency::     Relationship::     Contact Information:     Housing/Transportation Living arrangements for the past 2 months:  Single Family Home Source of Information:  Patient, Adult Children Patient Interpreter Needed:  None Criminal Activity/Legal Involvement Pertinent to Current Situation/Hospitalization:  No - Comment as needed Significant Relationships:  Adult Children, Other Family Members Lives with:  Adult Children Do you feel safe going back to the place where you live?  Yes Need for family participation in patient care:  No (Coment)  Care giving concerns:  No concerns reported   Office manager / plan:  [name] 81 year old female engaged in conversation with CSW.  States she currently lives with her daughter.  She has been taking care of her mother for the past three years. Patient has received home health in the past.   Patient states she using a walker at home.  Patient has been converted to inpatient.  Patient evaluated by PT with recommendation for STR, Patient states she believes she needs to go.  OK for CSW to start a bed search.  Patient has been converted to inpatient and will need a 3 night stay for SNF. CSW will completed FL2, place copy on chart and fax out for available beds in the event patient needs SNF at discharge.   Employment status:  Disabled (Comment on whether or not currently receiving Disability) Insurance information:    PT Recommendations:  Skilled Nursing Facility Information /  Referral to community resources:  Skilled Nursing Facility  Patient/Family's Response to care:  Patient and daughter are both in agreement with patient going to SNF.  Patient/Family's Understanding of and Emotional Response to Diagnosis, Current Treatment, and Prognosis:  Patient understands will remain in the hospital for continued medical workup, will discharge to SNF for rehab once medically clear by MD.  Emotional Assessment Appearance:  Appears younger than stated age Attitude/Demeanor/Rapport:    Affect (typically observed):  Accepting, Adaptable, Appropriate, Pleasant Orientation:  Oriented to Self, Oriented to Place, Oriented to  Time, Oriented to Situation Alcohol / Substance use:  Never Used Psych involvement (Current and /or in the community):  No (Comment)  Discharge Needs  Concerns to be addressed:  Denies Needs/Concerns at this time Readmission within the last 30 days:  Yes Current discharge risk:  Dependent with Mobility, Chronically ill Barriers to Discharge:  No Barriers Identified   Soundra Pilon, LCSW 05/10/2015, 11:49 AM Sammuel Hines. Theresia Majors, MSW Clinical Social Work Department Emergency Room [phone] 11:52 AM

## 2015-05-10 NOTE — Consult Note (Signed)
[facility] Vascular Consult Note  MRN : [id number]  Kristina Wilson is a 81 y.o. (1923/01/20) female who presents with chief complaint of  Chief Complaint  Patient presents with  . Hypertension  .  History of Present Illness: Patient is admitted to the hospital with multiple issues and her CKD is now felt to be ESRD.  The patient reports swelling, SOB and weight gain. The nephrology service has decided to initiate dialysis at this time, and we are asked to place a temporary dialysis catheter for immediate dialysis use.  Had a PD catheter placed last month, but not ready to do this and needs to start on HD.  Also, can't do PD at rehab  Current Facility-Administered Medications  Medication Dose Route Frequency Provider Last Rate Last Dose  . acetaminophen (TYLENOL) tablet 650 mg  650 mg Oral Q6H PRN Lytle Butte, MD   650 mg at 05/10/15 3664  . amLODipine (NORVASC) tablet 10 mg  10 mg Oral Daily Loletha Grayer, MD   10 mg at 05/10/15 0918  . aspirin EC tablet 81 mg  81 mg Oral Daily Loletha Grayer, MD   81 mg at 05/10/15 4034  . carvedilol (COREG) tablet 6.25 mg  6.25 mg Oral BID Lance Coon, MD   6.25 mg at 05/10/15 0919  . Chlorhexidine Gluconate Cloth 2 % PADS 6 each  6 each Topical Q0600 Loletha Grayer, MD   6 each at 05/10/15 0600  . cloNIDine (CATAPRES) tablet 0.3 mg  0.3 mg Oral TID Lance Coon, MD   0.3 mg at 05/10/15 1605  . docusate sodium (COLACE) capsule 100 mg  100 mg Oral BID Loletha Grayer, MD   100 mg at 05/10/15 1345  . feeding supplement (ENSURE ENLIVE) (ENSURE ENLIVE) liquid 237 mL  237 mL Oral TID WC Loletha Grayer, MD   237 mL at 05/10/15 1700  . [START ON 05/11/2015] fentaNYL (DURAGESIC - dosed mcg/hr) patch 25 mcg  25 mcg Transdermal Q72H Loletha Grayer, MD      . furosemide (LASIX) tablet 40 mg  40 mg Oral Daily Lance Coon, MD   40 mg at 05/10/15 0918  . gentamicin cream (GARAMYCIN) 0.1 %-- PT'S OWN MED  1 application Topical Daily  Loletha Grayer, MD   1 application at 74/25/95 2100  . heparin injection 5,000 Units  5,000 Units Subcutaneous 3 times per day Lance Coon, MD   5,000 Units at 05/10/15 1344  . hydrALAZINE (APRESOLINE) injection 10 mg  10 mg Intravenous Q4H PRN Lance Coon, MD   10 mg at 05/08/15 0304  . hydrALAZINE (APRESOLINE) tablet 100 mg  100 mg Oral QID Loletha Grayer, MD   100 mg at 05/10/15 1710  . iron polysaccharides (NIFEREX) capsule 150 mg  150 mg Oral Daily Loletha Grayer, MD   150 mg at 05/10/15 1347  . labetalol (NORMODYNE,TRANDATE) injection 10 mg  10 mg Intravenous Q2H PRN Lance Coon, MD   10 mg at 05/08/15 0530  . lidocaine (LIDODERM) 5 % 1 patch  1 patch Transdermal Q24H Harrie Foreman, MD   1 patch at 05/10/15 [id number]  . mupirocin ointment (BACTROBAN) 2 % 1 application  1 application Nasal BID Loletha Grayer, MD   1 application at 56/43/32 0919  . nitroGLYCERIN (NITROGLYN) 2 % ointment 0.5 inch  0.5 inch Topical 4 times per day Loletha Grayer, MD   0.5 inch at 05/10/15 1710  . nitroGLYCERIN (NITROSTAT) SL tablet 0.4 mg  0.4 mg Sublingual  Q5 min PRN Loletha Grayer, MD   0.4 mg at 05/08/15 1120  . oxyCODONE (Oxy IR/ROXICODONE) immediate release tablet 5 mg  5 mg Oral Q8H PRN Loletha Grayer, MD   5 mg at 05/10/15 1345  . pantoprazole (PROTONIX) EC tablet 40 mg  40 mg Oral BID Lance Coon, MD   40 mg at 05/10/15 7858  . phenytoin (DILANTIN) ER capsule 300 mg  300 mg Oral QHS Lance Coon, MD   300 mg at 05/09/15 2252  . predniSONE (DELTASONE) tablet 10 mg  10 mg Oral Q breakfast Loletha Grayer, MD   10 mg at 05/10/15 0918  . sodium bicarbonate tablet 1,300 mg  1,300 mg Oral BID Lance Coon, MD   1,300 mg at 05/10/15 8502  . sodium chloride 0.9 % injection 3 mL  3 mL Intravenous Q12H Lance Coon, MD   3 mL at 05/10/15 0915    Past Medical History  Diagnosis Date  . Muscle weakness   . Chronic respiratory failure ([place])   . Pneumonia   . Sleep apnea   . CHF (congestive heart  failure) ([place])   . CKD (chronic kidney disease), stage IV ([place])   . Dysphagia   . GERD (gastroesophageal reflux disease)   . Back pain   . Epilepsy ([place])   . Hypertension   . Stroke ([place])   . Headache   . Arthritis   . Anemia     iron deficiency anemia  . Complication of anesthesia     difficult waking up after surgery (2013)    Past Surgical History  Procedure Laterality Date  . Back surgery    . Knee surgery    . Carotid endarterectomy    . Tonsillectomy    . Appendectomy    . Joint replacement Bilateral     Knee Replacements  . Dilation and curettage of uterus    . Breast surgery Right     Breast Biopsy  . Capd insertion N/A 04/12/2015    Procedure: LAPAROSCOPIC INSERTION CONTINUOUS AMBULATORY PERITONEAL DIALYSIS  (CAPD) CATHETER;  Surgeon: Katha Cabal, MD;  Location: [facility] ORS;  Service: Vascular;  Laterality: N/A;    Social History Social History  Substance Use Topics  . Smoking status: Former Smoker -- 2.00 packs/day for 20 years    Types: Cigarettes    Quit date: 06/04/1967  . Smokeless tobacco: Never Used  . Alcohol Use: No  No IVDU  Family History Family History  Problem Relation Age of Onset  . CAD Mother   . CAD Father   . Anemia Father   . CVA Sister   . Multiple myeloma Brother     Allergies  Allergen Reactions  . Enalapril Anaphylaxis  . Tramadol Shortness Of Breath  . Capsaicin Other (See Comments)    Reaction:  Unknown   . Codeine Other (See Comments)    Reaction:  Unknown   . Hydrocodone Other (See Comments)    Reaction:  Unknown   . Vicodin [Hydrocodone-Acetaminophen] Other (See Comments)    Reaction:  Unknown      REVIEW OF SYSTEMS (Negative unless checked)  Constitutional: [] Weight loss  [] Fever  [] Chills Cardiac: [] Chest pain   [] Chest pressure   [] Palpitations   [] Shortness of breath when laying flat   [] Shortness of breath at rest   [x] Shortness of breath with exertion. Vascular:  [] Pain in legs with walking   [] Pain in  legs at rest   [] Pain in legs when laying flat   []   Claudication   [] Pain in feet when walking  [] Pain in feet at rest  [] Pain in feet when laying flat   [] History of DVT   [] Phlebitis   [x] Swelling in legs   [] Varicose veins   [] Non-healing ulcers Pulmonary:   [] Uses home oxygen   [] Productive cough   [] Hemoptysis   [] Wheeze  [] COPD   [] Asthma Neurologic:  [] Dizziness  [] Blackouts   [] Seizures   [] History of stroke   [] History of TIA  [] Aphasia   [] Temporary blindness   [] Dysphagia   [] Weakness or numbness in arms   [] Weakness or numbness in legs Musculoskeletal:  [] Arthritis   [] Joint swelling   [] Joint pain   [] Low back pain Hematologic:  [] Easy bruising  [] Easy bleeding   [] Hypercoagulable state   [] Anemic  [] Hepatitis Gastrointestinal:  [] Blood in stool   [] Vomiting blood  [] Gastroesophageal reflux/heartburn   [] Difficulty swallowing. Genitourinary:  [x] Chronic kidney disease   [] Difficult urination  [] Frequent urination  [] Burning with urination   [] Blood in urine Skin:  [] Rashes   [] Ulcers   [] Wounds Psychological:  [] History of anxiety   []  History of major depression.    Physical Examination  Filed Vitals:   05/10/15 0637 05/10/15 0907 05/10/15 1147 05/10/15 1601  BP:  160/60 147/50 175/53  Pulse:  86 81 90  Temp:   98.6 F (37 C)   TempSrc:   Oral   Resp:   18   Height:      Weight: 95.618 kg (210 lb 12.8 oz)     SpO2:   94%    Body mass index is 36.17 kg/(m^2). Gen: WD/WN Head: [place]/AT, No temporalis wasting Ear/Nose/Throat: Hearing grossly intact, nares w/o erythema or drainage,  Eyes: PERRLA, EOMI.  Neck: Supple, no nuchal rigidity.  Pulmonary:  Good air movement, equal BS bilaterally Cardiac: RRR, No JVD  Gastrointestinal: soft, non-tender/non-distended. No guarding/reflex.  Musculoskeletal: M/S 5/5 throughout.  Extremities without ischemic changes.  No deformity or atrophy. 1-2+ Edema in the lower extremities bilaterally Neurologic: awake, appropriate Psychiatric:  normal affect and mood Dermatologic: No rashes or ulcers noted.   Lymph : No Cervical, Axillary, or Inguinal lymphadenopathy.      CBC Lab Results  Component Value Date   WBC 7.5 05/10/2015   HGB 7.8* 05/10/2015   HCT 23.0* 05/10/2015   MCV 98.2 05/10/2015   PLT 234 05/10/2015    BMET    Component Value Date/Time   NA 135 05/10/2015 0426   NA 134* 11/21/2014 0446   K 3.8 05/10/2015 0426   K 5.0 11/21/2014 0446   CL 98* 05/10/2015 0426   CL 106 11/21/2014 0446   CO2 29 05/10/2015 0426   CO2 25 11/21/2014 0446   GLUCOSE 108* 05/10/2015 0426   GLUCOSE 92 11/21/2014 0446   BUN 40* 05/10/2015 0426   BUN 53* 11/21/2014 0446   CREATININE 2.86* 05/10/2015 0426   CREATININE 2.14* 11/21/2014 0446   CALCIUM 9.2 05/10/2015 0426   CALCIUM 9.0 11/21/2014 0446   GFRNONAA 14* 05/10/2015 0426   GFRNONAA 20* 11/21/2014 0446   GFRAA 16* 05/10/2015 0426   GFRAA 23* 11/21/2014 0446   Estimated Creatinine Clearance: 14.7 mL/min (by C-G formula based on Cr of 2.86).  COAG Lab Results  Component Value Date   INR 0.94 04/01/2015   INR 1.0 11/19/2014   INR 1.1 09/16/2013    Radiology Ct Head Wo Contrast  05/08/2015   CLINICAL DATA:  Acute onset of high blood pressure. Mild headache. Initial encounter.  EXAM:  CT HEAD WITHOUT CONTRAST  TECHNIQUE: Contiguous axial images were obtained from the base of the skull through the vertex without intravenous contrast.  COMPARISON:  CT of the head performed 03/15/2015  FINDINGS: There is no evidence of acute infarction, mass lesion, or intra- or extra-axial hemorrhage on CT.  Mild periventricular white matter change likely reflects small vessel ischemic microangiopathy. A small chronic lacunar infarct is noted at the right basal ganglia.  The posterior fossa, including the cerebellum, brainstem and fourth ventricle, is within normal limits. The third and lateral ventricles are unremarkable in appearance. The cerebral hemispheres are symmetric in  appearance, with normal gray-white differentiation. No mass effect or midline shift is seen.  There is no evidence of fracture; visualized osseous structures are unremarkable in appearance. The orbits are within normal limits. The paranasal sinuses and mastoid air cells are well-aerated. No significant soft tissue abnormalities are seen.  IMPRESSION: 1. No acute intracranial pathology seen on CT. 2. Mild small vessel ischemic microangiopathy. Small chronic lacunar infarct at the right basal ganglia.   Electronically Signed   By: Garald Balding M.D.   On: 05/08/2015 01:32   US Venous Img Upper Uni Left  05/08/2015   CLINICAL DATA:  Left arm swelling for 1 day. Swelling in the left lower arm.  EXAM: LEFT UPPER EXTREMITY VENOUS DOPPLER ULTRASOUND  TECHNIQUE: Gray-scale sonography with graded compression, as well as color Doppler and duplex ultrasound were performed to evaluate the upper extremity deep venous system from the level of the subclavian vein and including the jugular, axillary, basilic and upper cephalic vein. Spectral Doppler was utilized to evaluate flow at rest and with distal augmentation maneuvers.  COMPARISON:  None.  FINDINGS: Color Doppler flow and phasicity in the right subclavian vein.  Normal compressibility, color Doppler flow and phasicity in the left internal jugular vein. Normal color Doppler flow and phasicity in the left subclavian vein. The left cephalic vein is compressible with color Doppler flow and augmentation. Left axillary vein is compressible with color Doppler flow and augmentation. Normal color Doppler flow, compressibility and augmentation in the left brachial veins. Normal compressibility, augmentation and color Doppler flow in the left basilic vein. Limited evaluation of the forearm veins but no evidence for thrombus in the left radial or ulnar veins.  Other findings:  Subcutaneous edema in the left forearm.  IMPRESSION: Negative for deep vein thrombosis in the left upper  extremity.   Electronically Signed   By: Markus Daft M.D.   On: 05/08/2015 16:33   Dg Chest Port 1 View  05/09/2015   CLINICAL DATA:  Hypertension and hypoxia  EXAM: PORTABLE CHEST - 1 VIEW  COMPARISON:  03/16/2015  FINDINGS: Persistent left basilar changes are noted with associated effusion. Cardiomegaly is again noted. Aortic calcifications are seen. The right lung remains clear.  IMPRESSION: Stable appearance of the chest with left basilar atelectasis and associated left effusion.   Electronically Signed   By: Inez Catalina M.D.   On: 05/09/2015 15:49      Assessment/Plan 1. Renal failure.  Now likely ESRD. Cant do PD at rehab 2. Carotid stenosis.  S/p CEA previously 3. HTN. Stable.   We will proceed with temporary dialysis catheter placement at this time.  Risks and benefits discussed with patient and/or family, and the catheter will be placed to allow immediate initiation of dialysis.  If the patient's renal function does not improve throughout the hospital course, we will be happy to place a tunneled dialysis catheter for  long term use prior to discharge.  This will likely be needed.  Can't do PD at discharge in rehab   [place], MD  05/10/2015 6:34 PM

## 2015-05-10 NOTE — Progress Notes (Signed)
Physical Therapy Treatment Patient Details Name: Kristina Wilson MRN: [id number] DOB: 1922-10-08 Today's Date: 05/10/2015    History of Present Illness Kristina Wilson is a 81 y.o. female with a known history of COPD, sleep apnea, CHF, chronic kidney disease, gastroesophageal reflux disease, chronic anemia, chronic back pain, episode of TIA in the past- has been gradually getting weak for last 1 month (recent hospitalization Aug 2016).  Presented to ER this admission secondary to HA and elevated BP; admitted for accelerated HTN and chest pain.  Mild elevation in troponin (peak 0.12), consistent with demand ischemia per MD.    PT Comments    Patient with fair/good isolated strength and movement of bilat LEs in supine; able to complete all therex with minimal assist from therapist.  Patient motivated to attempt transition to unsupported sitting again this date ("I want to try"), but only able to complete approx 50% movement transition due to pain; required return to supine for relief.  Continues to rate pain in R hip, "sciatica", at 8-9/10; significant barrier to mobility at this time.   Follow Up Recommendations  SNF     Equipment Recommendations       Recommendations for Other Services       Precautions / Restrictions Precautions Precautions: Fall Precaution Comments: Contact isolation Restrictions Weight Bearing Restrictions: No    Mobility  Bed Mobility Overal bed mobility: Needs Assistance;+2 for physical assistance Bed Mobility: Supine to Sit     Supine to sit: Mod assist Sit to supine: Max assist;+2 for physical assistance   General bed mobility comments: dep +2 for scooting up in bed, repositioning for comfort  Transfers                 General transfer comment: unable to tolerate secondary to significant pain with transition to upright  Ambulation/Gait             General Gait Details: unable to tolerate secondary to significant pain with  transition to upright   Stairs            Wheelchair Mobility    Modified Rankin (Stroke Patients Only)       Balance Overall balance assessment: Needs assistance Sitting-balance support: No upper extremity supported;Feet supported Sitting balance-Leahy Scale: Poor Sitting balance - Comments: unable to maintain static, upright sitting posture due to pain in R hip; maintains L lateral lean with weight offset to L IT/hip.  Very restless, unable to tolerate position >2-3 min prior to need for return to supine.                            Cognition Arousal/Alertness: Awake/alert Behavior During Therapy: WFL for tasks assessed/performed Overall Cognitive Status: Within Functional Limits for tasks assessed                      Exercises Other Exercises Other Exercises: Supine LE therex, 1x15, AROM for LE strength/endurance with functional mobility: ankle pumps, quad sets, SAQs, heel slides, hip abduct/adduct.    General Comments        Pertinent Vitals/Pain Pain Score: 8  Pain Location: R hip/LE Pain Descriptors / Indicators: Aching;Grimacing;Guarding Pain Intervention(s): Limited activity within patient's tolerance;Monitored during session;Repositioned    Home Living                      Prior Function            PT Goals (  current goals can now be found in the care plan section) Acute Rehab PT Goals Patient Stated Goal: "I've got to lie back down" PT Goal Formulation: With patient/family Time For Goal Achievement: 05/23/15 Potential to Achieve Goals: Fair Additional Goals Additional Goal #1: Unsupported sitting balance edge of bed, mod indep, for participation with ADLs and functional activities. Additional Goal #2: Assess and establish goals for OOB as appropriate. Progress towards PT goals: Progressing toward goals    Frequency  Min 2X/week    PT Plan Current plan remains appropriate    Co-evaluation             End of  Session   Activity Tolerance: Patient limited by pain Patient left: in bed;with call bell/phone within reach;with bed alarm set     Time: 0950-1015 PT Time Calculation (min) (ACUTE ONLY): 25 min  Charges:  $Therapeutic Exercise: 8-22 mins $Therapeutic Activity: 8-22 mins                    G Codes:      Kristina Bellanca H. Manson Wilson, PT, DPT, NCS 05/10/2015, 11:48 AM [phone]

## 2015-05-11 LAB — RENAL FUNCTION PANEL
ANION GAP: 9 (ref 5–15)
Albumin: 2.8 g/dL — ABNORMAL LOW (ref 3.5–5.0)
BUN: 41 mg/dL — ABNORMAL HIGH (ref 6–20)
CO2: 30 mmol/L (ref 22–32)
Calcium: 9.5 mg/dL (ref 8.9–10.3)
Chloride: 96 mmol/L — ABNORMAL LOW (ref 101–111)
Creatinine, Ser: 2.89 mg/dL — ABNORMAL HIGH (ref 0.44–1.00)
GFR calc Af Amer: 15 mL/min — ABNORMAL LOW (ref >60)
GFR calc non Af Amer: 13 mL/min — ABNORMAL LOW (ref >60)
GLUCOSE: 94 mg/dL (ref 65–99)
POTASSIUM: 3.7 mmol/L (ref 3.5–5.1)
Phosphorus: 5.4 mg/dL — ABNORMAL HIGH (ref 2.5–4.6)
Sodium: 135 mmol/L (ref 135–145)

## 2015-05-11 LAB — IRON AND TIBC
Iron: 77 ug/dL (ref 28–170)
SATURATION RATIOS: 38 % — AB (ref 10.4–31.8)
TIBC: 202 ug/dL — AB (ref 250–450)
UIBC: 125 ug/dL

## 2015-05-11 LAB — CBC
HCT: 22.4 % — ABNORMAL LOW (ref 35.0–47.0)
Hemoglobin: 7.5 g/dL — ABNORMAL LOW (ref 12.0–16.0)
MCH: 32.4 pg (ref 26.0–34.0)
MCHC: 33.4 g/dL (ref 32.0–36.0)
MCV: 96.9 fL (ref 80.0–100.0)
PLATELETS: 245 10*3/uL (ref 150–440)
RBC: 2.31 MIL/uL — ABNORMAL LOW (ref 3.80–5.20)
RDW: 12.3 % (ref 11.5–14.5)
WBC: 6.2 10*3/uL (ref 3.6–11.0)

## 2015-05-11 LAB — FERRITIN: Ferritin: 37 ng/mL (ref 11–307)

## 2015-05-11 MED ORDER — CARVEDILOL 12.5 MG PO TABS
12.5000 mg | ORAL_TABLET | Freq: Two times a day (BID) | ORAL | Status: DC
  Administered 2015-05-11 – 2015-05-13 (×6): 12.5 mg via ORAL
  Filled 2015-05-11 (×5): qty 1

## 2015-05-11 MED ORDER — TUBERCULIN PPD 5 UNIT/0.1ML ID SOLN
5.0000 [IU] | Freq: Once | INTRADERMAL | Status: AC
  Administered 2015-05-11: 5 [IU] via INTRADERMAL
  Filled 2015-05-11: qty 0.1

## 2015-05-11 MED ORDER — EPOETIN ALFA 10000 UNIT/ML IJ SOLN: 10000.0000 [IU] | Freq: Once | INTRAMUSCULAR | Status: DC

## 2015-05-11 MED ORDER — ONDANSETRON HCL 4 MG/2ML IJ SOLN
4.0000 mg | Freq: Four times a day (QID) | INTRAMUSCULAR | Status: DC | PRN
  Administered 2015-05-11 – 2015-05-13 (×3): 4 mg via INTRAVENOUS
  Filled 2015-05-11 (×3): qty 2

## 2015-05-11 MED ORDER — DIPHENHYDRAMINE HCL 25 MG PO CAPS
25.0000 mg | ORAL_CAPSULE | Freq: Three times a day (TID) | ORAL | Status: DC | PRN
  Administered 2015-05-11 – 2015-05-20 (×4): 25 mg via ORAL
  Filled 2015-05-11 (×4): qty 1

## 2015-05-11 MED ORDER — EPOETIN ALFA 10000 UNIT/ML IJ SOLN
10000.0000 [IU] | Freq: Once | INTRAMUSCULAR | Status: AC
  Administered 2015-05-11: 10000 [IU] via INTRAVENOUS

## 2015-05-11 NOTE — Progress Notes (Signed)
Pt went to HD and no fluid was removed. Will go back to HD tomorrow and fluid will be removed. Iso for MRSA. 2 L of oxgyen. NSR. Takes meds ok. Fentayl patch was applied to R arm and TB skin test applied to L FA. Family updated with social work. Bedpan. Pt reported pain and received oxy. Pt has periods of confusion. Edema in arms and legs. Pt has no further concerns at this time.

## 2015-05-11 NOTE — Progress Notes (Signed)
[facility]  ROUNDING NOTE   Subjective:   Patient seen and examined on hemodialysis. First treatment. Tolerating well.   Right femoral tunneled temp HD catheter placed yesterday by Dr. Wyn Quaker  Objective:  Vital signs in last 24 hours:  Temp:  [98 F (36.7 C)-98.6 F (37 C)] 98 F (36.7 C) (10/08 0556) Pulse Rate:  [80-93] 80 (10/08 0922) Resp:  [18-20] 19 (10/08 0556) BP: (133-204)/(48-69) 185/62 mmHg (10/08 0922) SpO2:  [94 %-98 %] 96 % (10/08 0556) Weight:  [95.482 kg (210 lb 8 oz)] 95.482 kg (210 lb 8 oz) (10/08 0500)  Weight change: -0.136 kg (-4.8 oz) Filed Weights   05/09/15 0504 05/10/15 0637 05/11/15 0500  Weight: 95.845 kg (211 lb 4.8 oz) 95.618 kg (210 lb 12.8 oz) 95.482 kg (210 lb 8 oz)    Intake/Output: I/O last 3 completed shifts: In: 183 [P.O.:180; I.V.:3] Out: 300 [Urine:300]   Intake/Output this shift:     Physical Exam: General: NAD  Head: Normocephalic, atraumatic. Moist oral mucosal membranes  Eyes: Anicteric, PERRL  Neck: Supple, trachea midline  Lungs:  Clear to auscultation  Heart: Regular rate and rhythm  Abdomen:  Soft, nontender, +peritoneal dialysis catheter  Extremities: trace peripheral edema.  Neurologic: Nonfocal, moving all four extremities  Skin: No lesions  Access: PD catheter , right femoral temp dialysis 10/7 Dr. Wyn Quaker    Basic Metabolic Panel:  Recent Labs Lab 05/07/15 2256 05/09/15 0444 05/10/15 0426 05/11/15 0903  NA 139 138 135 135  K 4.5 4.1 3.8 3.7  CL 101 101 98* 96*  CO2 GLUCOSE 126* 92 108* 94  BUN 43* 41* 40* 41*  CREATININE 2.96* 3.01* 2.86* 2.89*  CALCIUM 9.4 9.3 9.2 9.5  PHOS  --   --   --  5.4*    Liver Function Tests:  Recent Labs Lab 05/11/15 0903  ALBUMIN 2.8*   No results for input(s): LIPASE, AMYLASE in the last 168 hours. No results for input(s): AMMONIA in the last 168 hours.  CBC:  Recent Labs Lab 05/07/15 2256 05/10/15 0426 05/11/15 0533  WBC 9.6 7.5  6.2  HGB 8.7* 7.8* 7.5*  HCT 25.7* 23.0* 22.4*  MCV 98.3 98.2 96.9  PLT 294 234 245    Cardiac Enzymes:  Recent Labs Lab 05/07/15 2256 05/08/15 0559 05/08/15 1212 05/08/15 1716  TROPONINI 0.11* 0.12* 0.08* 0.08*    BNP: Invalid input(s): POCBNP  CBG: No results for input(s): GLUCAP in the last 168 hours.  Microbiology: Results for orders placed or performed during the hospital encounter of 05/07/15  MRSA PCR Screening     Status: Abnormal   Collection Time: 05/08/15  7:50 AM  Result Value Ref Range Status   MRSA by PCR POSITIVE (A) NEGATIVE Final    Comment:        The GeneXpert MRSA Assay (FDA approved for NASAL specimens only), is one component of a comprehensive MRSA colonization surveillance program. It is not intended to diagnose MRSA infection nor to guide or monitor treatment for MRSA infections. CRITICAL RESULT CALLED TO, READ BACK BY AND VERIFIED WITH: JESSICA CHRISTMAS ON 05/08/15 AT 1022 BY QSD     Coagulation Studies: No results for input(s): LABPROT, INR in the last 72 hours.  Urinalysis: No results for input(s): COLORURINE, LABSPEC, PHURINE, GLUCOSEU, HGBUR, BILIRUBINUR, KETONESUR, PROTEINUR, UROBILINOGEN, NITRITE, LEUKOCYTESUR in the last 72 hours.  Invalid input(s): APPERANCEUR    Imaging: Dg Chest Port 1 View  05/09/2015   CLINICAL  DATA:  Hypertension and hypoxia  EXAM: PORTABLE CHEST - 1 VIEW  COMPARISON:  03/16/2015  FINDINGS: Persistent left basilar changes are noted with associated effusion. Cardiomegaly is again noted. Aortic calcifications are seen. The right lung remains clear.  IMPRESSION: Stable appearance of the chest with left basilar atelectasis and associated left effusion.   Electronically Signed   By: Alcide Clever M.D.   On: 05/09/2015 15:49     Medications:     . amLODipine  10 mg Oral Daily  . aspirin EC  81 mg Oral Daily  . carvedilol  6.25 mg Oral BID  . Chlorhexidine Gluconate Cloth  6 each Topical Q0600  .  cloNIDine  0.3 mg Oral TID  . docusate sodium  100 mg Oral BID  . epoetin (EPOGEN/PROCRIT) injection  10,000 Units Intravenous Once  . feeding supplement (ENSURE ENLIVE)  237 mL Oral TID WC  . fentaNYL  25 mcg Transdermal Q72H  . furosemide  40 mg Oral Daily  . gentamicin cream  1 application Topical Daily  . hydrALAZINE  100 mg Oral QID  . iron polysaccharides  150 mg Oral Daily  . lidocaine  1 patch Transdermal Q24H  . mupirocin ointment  1 application Nasal BID  . nitroGLYCERIN  0.5 inch Topical 4 times per day  . pantoprazole  40 mg Oral BID  . phenytoin  300 mg Oral QHS  . predniSONE  10 mg Oral Q breakfast  . sodium bicarbonate  1,300 mg Oral BID  . sodium chloride  3 mL Intravenous Q12H  . tuberculin  5 Units Intradermal Once   acetaminophen, hydrALAZINE, labetalol, nitroGLYCERIN, oxyCODONE  Assessment/ Plan:  [name] with past medical history of hypertension, obstructive sleep apnea, DJD, CVA with residual left sided weakness, carotid stenosis status post L CEA 2/15, seizure disorder, diastolic congestive heart failure, anemia  1. End Stage Renal Disease with proteinuria: progression of diease from Chronic kidney disease stage V. 24 creatinine clearance 12. - initiated on hemodialysis on 10/8 through right femoral temp HD catheter.  - She is to be discharged to skilled nursing facility where she will not be able to be trained on peritoneal dialysis. This will be done when she is discharged and back home.  - Outpatient planning: I believe patient has already been accepted to [facility]. Will work with care management on discharge planning. She will need a tunneled catheter before discharge.    - discontinue sodium bicarbonate  - renally dose all medications  2. Hypertension. Blood pressure elevated, volume and pain driven - home regimen of carvedilol, clonidine, diltiazem, furosemide hydralazine - now also on amlodipine  3.  Secondary hyperparathyroidism. PTH 143 on 9/16. Calcium and phosphorus at goal  4. Anemia chronic kidney disease: hemoglobin 7.5. Has not received epo due to CVA.  - epo with treatment.    LOS: 2 Kristina Wilson 10/8/201611:18 AM

## 2015-05-11 NOTE — Progress Notes (Signed)
Clinical Child psychotherapist (CSW) received call from patient's daughter Kristina Wilson. Per Kristina Wilson patient is receiving dialysis today for the first time. Per daughter she would like for patient to go to [facility] in [place] for outpatient dialysis. Daughter reported that while patient is at rehab she will receive HD and after rehab daughter plans on doing PD at home. CSW explained that another bed search will be sent out including patient's dialysis information. Daughter verbalized her understanding. Daughter inquired about SNF medicare days. CSW explained that patient has to have a 60 day wellness period out of rehab and the hospital for her medicare days to start over. Daughter reported that patient left [facility] in June, 2016. CSW explained to daughter that since patient has been out of rehab since June her medicare days should start over. Patient was admitted under inpatient status to [facility] on 05/09/15.   CSW sent new bed search with dialysis information. CSW left voicemail for Ivor Reining Dialysis coordinator making her aware of above. CSW will continue to follow and assist as needed.   Jetta Lout, LCSWA [phone]

## 2015-05-11 NOTE — Progress Notes (Addendum)
Patient ID: Kristina Wilson, female   DOB: 1923-04-26, 81 y.o.   MRN: [id number] [facility] PROGRESS NOTE  Kristina Wilson:[id number] DOB: 16-Oct-1922 DOA: 05/07/2015 PCP: Jaclyn Shaggy, MD  HPI/Subjective: Patient has no complaints.  Objective: Filed Vitals:   05/11/15 1320  BP:   Pulse: 95  Temp:   Resp: 17    Filed Weights   05/09/15 0504 05/10/15 0637 05/11/15 0500  Weight: 95.845 kg (211 lb 4.8 oz) 95.618 kg (210 lb 12.8 oz) 95.482 kg (210 lb 8 oz)    ROS: Review of Systems  Constitutional: Negative for fever and chills.  Eyes: Negative for blurred vision.  Respiratory: Negative for cough and shortness of breath.   Cardiovascular: Negative for chest pain.  Gastrointestinal: Negative for nausea, vomiting, abdominal pain, diarrhea and constipation.  Genitourinary: Negative for dysuria.  Musculoskeletal: Negative for joint pain.  Neurological: Negative for dizziness and headaches.   Exam: Physical Exam  Constitutional: She is oriented to person, place, and time.  HENT:  Nose: No mucosal edema.  Mouth/Throat: No oropharyngeal exudate or posterior oropharyngeal edema.  Eyes: Conjunctivae, EOM and lids are normal. Pupils are equal, round, and reactive to light.  Neck: No JVD present. Carotid bruit is not present. No edema present. No thyroid mass and no thyromegaly present.  Cardiovascular: S1 normal and S2 normal.  Exam reveals no gallop.   No murmur heard. Pulses:      Dorsalis pedis pulses are 2+ on the right side, and 2+ on the left side.  Respiratory: No respiratory distress. She has no wheezes. She has no rhonchi. She has no rales.  GI: Soft. Bowel sounds are normal. There is no tenderness.  Musculoskeletal: She exhibits no tenderness.       Right ankle: She exhibits swelling.       Left ankle: She exhibits swelling.  Pain in the right sacroiliac area and over lumbar spine to palpation.  Lymphadenopathy:    She has no cervical adenopathy.   Neurological: She is alert and oriented to person, place, and time. No cranial nerve deficit.  Patient barely able to straight leg raise.  Skin: Skin is warm. Nails show no clubbing.  Chronic lower extremity discoloration bilaterally  Psychiatric: She has a normal mood and affect.    Data Reviewed: Basic Metabolic Panel:  Recent Labs Lab 05/07/15 2256 05/09/15 0444 05/10/15 0426 05/11/15 0903  NA 139 138 135 135  K 4.5 4.1 3.8 3.7  CL 101 101 98* 96*  CO2 GLUCOSE 126* 92 108* 94  BUN 43* 41* 40* 41*  CREATININE 2.96* 3.01* 2.86* 2.89*  CALCIUM 9.4 9.3 9.2 9.5  PHOS  --   --   --  5.4*   CBC:  Recent Labs Lab 05/07/15 2256 05/10/15 0426 05/11/15 0533  WBC 9.6 7.5 6.2  HGB 8.7* 7.8* 7.5*  HCT 25.7* 23.0* 22.4*  MCV 98.3 98.2 96.9  PLT 294 234 245   Cardiac Enzymes:  Recent Labs Lab 05/07/15 2256 05/08/15 0559 05/08/15 1212 05/08/15 1716  TROPONINI 0.11* 0.12* 0.08* 0.08*   BNP (last 3 results)  Recent Labs  02/02/15 2121 05/07/15 2256  BNP 228.0* 805.0*      Recent Results (from the past 240 hour(s))  MRSA PCR Screening     Status: Abnormal   Collection Time: 05/08/15  7:50 AM  Result Value Ref Range Status   MRSA by PCR POSITIVE (A) NEGATIVE Final    Comment:  The GeneXpert MRSA Assay (FDA approved for NASAL specimens only), is one component of a comprehensive MRSA colonization surveillance program. It is not intended to diagnose MRSA infection nor to guide or monitor treatment for MRSA infections. CRITICAL RESULT CALLED TO, READ BACK BY AND VERIFIED WITH: JESSICA CHRISTMAS ON 05/08/15 AT 1022 BY QSD      Studies: Dg Chest Port 1 View  05/09/2015   CLINICAL DATA:  Hypertension and hypoxia  EXAM: PORTABLE CHEST - 1 VIEW  COMPARISON:  03/16/2015  FINDINGS: Persistent left basilar changes are noted with associated effusion. Cardiomegaly is again noted. Aortic calcifications are seen. The right lung remains clear.   IMPRESSION: Stable appearance of the chest with left basilar atelectasis and associated left effusion.   Electronically Signed   By: Alcide Clever M.D.   On: 05/09/2015 15:49    Scheduled Meds: . amLODipine  10 mg Oral Daily  . aspirin EC  81 mg Oral Daily  . carvedilol  6.25 mg Oral BID  . Chlorhexidine Gluconate Cloth  6 each Topical Q0600  . cloNIDine  0.3 mg Oral TID  . docusate sodium  100 mg Oral BID  . feeding supplement (ENSURE ENLIVE)  237 mL Oral TID WC  . fentaNYL  25 mcg Transdermal Q72H  . furosemide  40 mg Oral Daily  . gentamicin cream  1 application Topical Daily  . hydrALAZINE  100 mg Oral QID  . iron polysaccharides  150 mg Oral Daily  . lidocaine  1 patch Transdermal Q24H  . mupirocin ointment  1 application Nasal BID  . nitroGLYCERIN  0.5 inch Topical 4 times per day  . pantoprazole  40 mg Oral BID  . phenytoin  300 mg Oral QHS  . predniSONE  10 mg Oral Q breakfast  . sodium chloride  3 mL Intravenous Q12H  . tuberculin  5 Units Intradermal Once    Assessment/Plan:  1.   Accelerated hypertension- blood pressure is still not controlled. Continue IV hydralazine and labetalol when necessary. Continue Norvasc, hydralazine, Lasix, clonidine and increase Coreg to 12.5 mg twice a day. 2.   Back pain- completed doses of IV Solu-Medrol. Trial of oxycodone. Increased fentanyl patch. Patient wanted to hold off on imaging studies at this point. 3.   Chest pain and borderline troponin- labeled as demand ischemia by cardiology. Conservative management. Continue aspirin. 4.   Chronic pain- increased fentanyl patch 5.   Gastroesophageal reflux disease without esophagitis continue Protonix 6.   Chronic kidney disease stage IV, progression of diease from Chronic kidney disease stage V. start hemodialysis today and possible peritoneal dialysis after discharge. discontinue sodium bicarbonate. 6.   History of seizure disorder on phenytoin 7.   Weakness- per physical therapy  evaluation, the patient needs skilled nursing facility placement.  8. Chronic diastolic CHF (congestive heart failure) ejection fraction 55-60%. Stable.  Code Status:     Code Status Orders        Start     Ordered   05/08/15 0514  Full code   Continuous     05/08/15 0513     Family Communication: Spoke with daughter and granddaughter. Disposition Plan: Possible discharge to skilled nursing facility in 2 days.  Consultants:  Cardiology  Nephrology  Time spent: 35 minutes  Shaune Pollack  [facility]

## 2015-05-11 NOTE — Progress Notes (Signed)
MD [name] was notified that BP was 200/x and IV meds given. Waiting on HD to be started. No further concerns at this time.

## 2015-05-12 LAB — CBC
HCT: 26.2 % — ABNORMAL LOW (ref 35.0–47.0)
Hemoglobin: 8.7 g/dL — ABNORMAL LOW (ref 12.0–16.0)
MCH: 32.5 pg (ref 26.0–34.0)
MCHC: 33.2 g/dL (ref 32.0–36.0)
MCV: 97.9 fL (ref 80.0–100.0)
PLATELETS: 277 10*3/uL (ref 150–440)
RBC: 2.67 MIL/uL — AB (ref 3.80–5.20)
RDW: 12.8 % (ref 11.5–14.5)
WBC: 11.4 10*3/uL — AB (ref 3.6–11.0)

## 2015-05-12 LAB — RENAL FUNCTION PANEL
ALBUMIN: 2.8 g/dL — AB (ref 3.5–5.0)
Anion gap: 6 (ref 5–15)
BUN: 32 mg/dL — ABNORMAL HIGH (ref 6–20)
CALCIUM: 9.3 mg/dL (ref 8.9–10.3)
CHLORIDE: 95 mmol/L — AB (ref 101–111)
CO2: 32 mmol/L (ref 22–32)
CREATININE: 2.32 mg/dL — AB (ref 0.44–1.00)
GFR, EST AFRICAN AMERICAN: 20 mL/min — AB (ref >60)
GFR, EST NON AFRICAN AMERICAN: 17 mL/min — AB (ref >60)
Glucose, Bld: 122 mg/dL — ABNORMAL HIGH (ref 65–99)
PHOSPHORUS: 3.7 mg/dL (ref 2.5–4.6)
Potassium: 3.8 mmol/L (ref 3.5–5.1)
SODIUM: 133 mmol/L — AB (ref 135–145)

## 2015-05-12 LAB — HEPATITIS B CORE ANTIBODY, TOTAL: HEP B C TOTAL AB: NEGATIVE

## 2015-05-12 LAB — HEPATITIS B SURFACE ANTIGEN: Hepatitis B Surface Ag: NEGATIVE

## 2015-05-12 LAB — HEPATITIS C ANTIBODY: HCV Ab: <0.1 s/co ratio (ref 0.0–0.9)

## 2015-05-12 LAB — HEPATITIS B SURFACE ANTIBODY,QUALITATIVE: HEP B S AB: NONREACTIVE

## 2015-05-12 LAB — PARATHYROID HORMONE, INTACT (NO CA): PTH: 140 pg/mL — ABNORMAL HIGH (ref 15–65)

## 2015-05-12 MED ORDER — FLUTICASONE PROPIONATE 50 MCG/ACT NA SUSP
2.0000 | Freq: Every day | NASAL | Status: DC
  Administered 2015-05-12 – 2015-05-22 (×10): 2 via NASAL
  Filled 2015-05-12: qty 16

## 2015-05-12 MED ORDER — HEPARIN SODIUM (PORCINE) 1000 UNIT/ML IJ SOLN
1000.0000 [IU] | Freq: Once | INTRAMUSCULAR | Status: AC
  Administered 2015-05-12: 3600 [IU]

## 2015-05-12 MED ORDER — METOCLOPRAMIDE HCL 5 MG/ML IJ SOLN
10.0000 mg | INTRAMUSCULAR | Status: DC | PRN
  Administered 2015-05-12 – 2015-05-13 (×2): 10 mg via INTRAVENOUS
  Filled 2015-05-12 (×3): qty 2

## 2015-05-12 NOTE — Progress Notes (Signed)
MD [name] was notified that pt is in a lot of pain and nausea. No further orders. Tresa Endo family member was updated on status post night.

## 2015-05-12 NOTE — Progress Notes (Signed)
Patient ID: Kristina Wilson, female   DOB: [birth year], 81 y.o.   MRN: [id number] [facility] PROGRESS NOTE  Kristina Wilson:[id number] DOB: 09/27/1922 DOA: 05/07/2015 PCP: Jaclyn Shaggy, MD  HPI/Subjective: Back pain and leg pain.  Objective: Filed Vitals:   05/12/15 1300  BP: 111/48  Pulse: 96  Temp: 98.6 F (37 C)  Resp: 17    Filed Weights   05/10/15 0637 05/11/15 0500 05/12/15 0451  Weight: 95.618 kg (210 lb 12.8 oz) 95.482 kg (210 lb 8 oz) 94.484 kg (208 lb 4.8 oz)    ROS: Review of Systems  Constitutional: Negative for fever and chills.  Eyes: Negative for blurred vision.  Respiratory: Negative for cough and shortness of breath.   Cardiovascular: Negative for chest pain.  Gastrointestinal: Negative for nausea, vomiting, abdominal pain, diarrhea and constipation.  Genitourinary: Negative for dysuria.  Musculoskeletal: Positive for back pain. Negative for joint pain.  Neurological: Negative for dizziness and headaches.   Exam: Physical Exam  Constitutional: She is oriented to person, place, and time.  HENT:  Nose: No mucosal edema.  Mouth/Throat: No oropharyngeal exudate or posterior oropharyngeal edema.  Eyes: Conjunctivae, EOM and lids are normal. Pupils are equal, round, and reactive to light.  Neck: No JVD present. Carotid bruit is not present. No edema present. No thyroid mass and no thyromegaly present.  Cardiovascular: S1 normal and S2 normal.  Exam reveals no gallop.   No murmur heard. Pulses:      Dorsalis pedis pulses are 2+ on the right side, and 2+ on the left side.  Respiratory: No respiratory distress. She has no wheezes. She has no rhonchi. She has no rales.  GI: Soft. Bowel sounds are normal. There is no tenderness.  Musculoskeletal: She exhibits no tenderness.       Right ankle: She exhibits swelling.       Left ankle: She exhibits swelling.  Pain in the right sacroiliac area and over lumbar spine to palpation.   Lymphadenopathy:    She has no cervical adenopathy.  Neurological: She is alert and oriented to person, place, and time. No cranial nerve deficit.  Patient barely able to straight leg raise.  Skin: Skin is warm. Nails show no clubbing.  Chronic lower extremity discoloration bilaterally  Psychiatric: She has a normal mood and affect.     Data Reviewed: Basic Metabolic Panel:  Recent Labs Lab 05/07/15 2256 05/09/15 0444 05/10/15 0426 05/11/15 0903 05/12/15 0934  NA 139 138 135 135 133*  K 4.5 4.1 3.8 3.7 3.8  CL 101 101 98* 96* 95*  CO2 28 28 29 30  32  GLUCOSE 126* 92 108* 94 122*  BUN 43* 41* 40* 41* 32*  CREATININE 2.96* 3.01* 2.86* 2.89* 2.32*  CALCIUM 9.4 9.3 9.2 9.5 9.3  PHOS  --   --   --  5.4* 3.7   CBC:  Recent Labs Lab 05/07/15 2256 05/10/15 0426 05/11/15 0533 05/12/15 0934  WBC 9.6 7.5 6.2 11.4*  HGB 8.7* 7.8* 7.5* 8.7*  HCT 25.7* 23.0* 22.4* 26.2*  MCV 98.3 98.2 96.9 97.9  PLT 294 234 245 277   Cardiac Enzymes:  Recent Labs Lab 05/07/15 2256 05/08/15 0559 05/08/15 1212 05/08/15 1716  TROPONINI 0.11* 0.12* 0.08* 0.08*   BNP (last 3 results)  Recent Labs  02/02/15 2121 05/07/15 2256  BNP 228.0* 805.0*      Recent Results (from the past 240 hour(s))  MRSA PCR Screening     Status: Abnormal  Collection Time: 05/08/15  7:50 AM  Result Value Ref Range Status   MRSA by PCR POSITIVE (A) NEGATIVE Final    Comment:        The GeneXpert MRSA Assay (FDA approved for NASAL specimens only), is one component of a comprehensive MRSA colonization surveillance program. It is not intended to diagnose MRSA infection nor to guide or monitor treatment for MRSA infections. CRITICAL RESULT CALLED TO, READ BACK BY AND VERIFIED WITH: JESSICA CHRISTMAS ON 05/08/15 AT 1022 BY QSD      Studies: No results found.  Scheduled Meds: . amLODipine  10 mg Oral Daily  . aspirin EC  81 mg Oral Daily  . carvedilol  12.5 mg Oral BID  . Chlorhexidine  Gluconate Cloth  6 each Topical Q0600  . cloNIDine  0.3 mg Oral TID  . docusate sodium  100 mg Oral BID  . feeding supplement (ENSURE ENLIVE)  237 mL Oral TID WC  . fentaNYL  25 mcg Transdermal Q72H  . fluticasone  2 spray Each Nare Daily  . furosemide  40 mg Oral Daily  . gentamicin cream  1 application Topical Daily  . hydrALAZINE  100 mg Oral QID  . iron polysaccharides  150 mg Oral Daily  . lidocaine  1 patch Transdermal Q24H  . mupirocin ointment  1 application Nasal BID  . nitroGLYCERIN  0.5 inch Topical 4 times per day  . pantoprazole  40 mg Oral BID  . phenytoin  300 mg Oral QHS  . predniSONE  10 mg Oral Q breakfast  . sodium chloride  3 mL Intravenous Q12H  . tuberculin  5 Units Intradermal Once    Assessment/Plan:  1.   Accelerated hypertension- blood pressure is controlled. Continue IV hydralazine and labetalol when necessary. Continue Norvasc, hydralazine, Lasix, clonidine and increased Coreg to 12.5 mg twice a day. 2.   Back pain- completed doses of IV Solu-Medrol. Trial of oxycodone. Increased fentanyl patch. Patient wanted to hold off on imaging studies at this point. 3.   Chest pain and borderline troponin- labeled as demand ischemia by cardiology. Conservative management. Continue aspirin. 4.   Chronic pain- increased fentanyl patch 5.   Gastroesophageal reflux disease without esophagitis continue Protonix 6.   Chronic kidney disease stage V, progression to ESRD. started hemodialysis yesterday and continue today and tomorrow per Dr. Wynelle Link. Possible peritoneal dialysis after discharge. discontinued sodium bicarbonate. 6.   History of seizure disorder on phenytoin 7.   Weakness- per physical therapy evaluation, the patient needs skilled nursing facility placement.  8. Chronic diastolic CHF (congestive heart failure) ejection fraction 55-60%. Stable.  Code Status:     Code Status Orders        Start     Ordered   05/08/15 0514  Full code   Continuous      05/08/15 0513     Family Communication: Spoke with daughter and granddaughter. Disposition Plan: Possible discharge to skilled nursing facility in 1-2 days.  Consultants:  Cardiology  Nephrology  Time spent: 35 minutes  Shaune Pollack  [facility]              Data Reviewed: Basic Metabolic Panel:  Recent Labs Lab 05/07/15 2256 05/09/15 0444 05/10/15 0426 05/11/15 0903 05/12/15 0934  NA 139 138 135 135 133*  K 4.5 4.1 3.8 3.7 3.8  CL 101 101 98* 96* 95*  CO2 28 28 29 30  32  GLUCOSE 126* 92 108* 94 122*  BUN 43* 41* 40*  41* 32*  CREATININE 2.96* 3.01* 2.86* 2.89* 2.32*  CALCIUM 9.4 9.3 9.2 9.5 9.3  PHOS  --   --   --  5.4* 3.7   CBC:  Recent Labs Lab 05/07/15 2256 05/10/15 0426 05/11/15 0533 05/12/15 0934  WBC 9.6 7.5 6.2 11.4*  HGB 8.7* 7.8* 7.5* 8.7*  HCT 25.7* 23.0* 22.4* 26.2*  MCV 98.3 98.2 96.9 97.9  PLT 294 234 245 277   Cardiac Enzymes:  Recent Labs Lab 05/07/15 2256 05/08/15 0559 05/08/15 1212 05/08/15 1716  TROPONINI 0.11* 0.12* 0.08* 0.08*   BNP (last 3 results)  Recent Labs  02/02/15 2121 05/07/15 2256  BNP 228.0* 805.0*      Recent Results (from the past 240 hour(s))  MRSA PCR Screening     Status: Abnormal   Collection Time: 05/08/15  7:50 AM  Result Value Ref Range Status   MRSA by PCR POSITIVE (A) NEGATIVE Final    Comment:        The GeneXpert MRSA Assay (FDA approved for NASAL specimens only), is one component of a comprehensive MRSA colonization surveillance program. It is not intended to diagnose MRSA infection nor to guide or monitor treatment for MRSA infections. CRITICAL RESULT CALLED TO, READ BACK BY AND VERIFIED WITH: JESSICA CHRISTMAS ON 05/08/15 AT 1022 BY QSD      Studies: No results found.  Scheduled Meds: . amLODipine  10 mg Oral Daily  . aspirin EC  81 mg Oral Daily  . carvedilol  12.5 mg Oral BID  . Chlorhexidine Gluconate Cloth  6 each Topical Q0600  . cloNIDine  0.3  mg Oral TID  . docusate sodium  100 mg Oral BID  . feeding supplement (ENSURE ENLIVE)  237 mL Oral TID WC  . fentaNYL  25 mcg Transdermal Q72H  . fluticasone  2 spray Each Nare Daily  . furosemide  40 mg Oral Daily  . gentamicin cream  1 application Topical Daily  . hydrALAZINE  100 mg Oral QID  . iron polysaccharides  150 mg Oral Daily  . lidocaine  1 patch Transdermal Q24H  . mupirocin ointment  1 application Nasal BID  . nitroGLYCERIN  0.5 inch Topical 4 times per day  . pantoprazole  40 mg Oral BID  . phenytoin  300 mg Oral QHS  . predniSONE  10 mg Oral Q breakfast  . sodium chloride  3 mL Intravenous Q12H  . tuberculin  5 Units Intradermal Once    Assessment/Plan:  1.   Accelerated hypertension- blood pressure is still not controlled. Continue IV hydralazine and labetalol when necessary. Continue Norvasc, hydralazine, Lasix, clonidine and increase Coreg to 12.5 mg twice a day. 2.   Back pain- completed doses of IV Solu-Medrol. Trial of oxycodone. Increased fentanyl patch. Patient wanted to hold off on imaging studies at this point. 3.   Chest pain and borderline troponin- labeled as demand ischemia by cardiology. Conservative management. Continue aspirin. 4.   Chronic pain- increased fentanyl patch 5.   Gastroesophageal reflux disease without esophagitis continue Protonix 6.   Chronic kidney disease stage IV, progression of diease from Chronic kidney disease stage V. start hemodialysis today and possible peritoneal dialysis after discharge. discontinue sodium bicarbonate. 6.   History of seizure disorder on phenytoin 7.   Weakness- per physical therapy evaluation, the patient needs skilled nursing facility placement.  8. Chronic diastolic CHF (congestive heart failure) ejection fraction 55-60%. Stable.  Code Status:     Code Status Orders  Start     Ordered   05/08/15 0514  Full code   Continuous     05/08/15 0513     Family Communication: Spoke with daughter and  granddaughter. Disposition Plan: Possible discharge to skilled nursing facility in 2 days.  Consultants:  Cardiology  Nephrology  Time spent: 35 minutes  Shaune Pollack  [facility]

## 2015-05-12 NOTE — Progress Notes (Signed)
Report called to [place]. Pt resting. Family updated on transfer.

## 2015-05-12 NOTE — Progress Notes (Signed)
[facility]  ROUNDING NOTE   Subjective:   Seen and examined on second hemodialysis treatment. Tolerating treatment well. UF of 1 litre.  Some nausea this morning.  Blood pressure elevated this morning.   Objective:  Vital signs in last 24 hours:  Temp:  [98.1 F (36.7 C)-98.4 F (36.9 C)] 98.4 F (36.9 C) (10/09 1045) Pulse Rate:  [78-95] 94 (10/09 1200) Resp:  [14-20] 17 (10/09 1200) BP: (104-201)/(35-97) 104/62 mmHg (10/09 1200) SpO2:  [92 %-94 %] 92 % (10/09 1040) Weight:  [94.484 kg (208 lb 4.8 oz)] 94.484 kg (208 lb 4.8 oz) (10/09 0451)  Weight change: -0.998 kg (-2 lb 3.2 oz) Filed Weights   05/10/15 0637 05/11/15 0500 05/12/15 0451  Weight: 95.618 kg (210 lb 12.8 oz) 95.482 kg (210 lb 8 oz) 94.484 kg (208 lb 4.8 oz)    Intake/Output: I/O last 3 completed shifts: In: 930 [P.O.:930] Out: 1700 [Urine:1200; Other:500]   Intake/Output this shift:  Total I/O In: 250 [P.O.:250] Out: 0   Physical Exam: General: NAD  Head: Normocephalic, atraumatic. Moist oral mucosal membranes  Eyes: Anicteric, PERRL  Neck: Supple, trachea midline  Lungs:  Clear to auscultation  Heart: Regular rate and rhythm  Abdomen:  Soft, nontender, +peritoneal dialysis catheter  Extremities: trace peripheral edema.  Neurologic: Nonfocal, moving all four extremities  Skin: No lesions  Access: PD catheter , right femoral temp dialysis 10/7 Dr. Wyn Quaker    Basic Metabolic Panel:  Recent Labs Lab 05/07/15 2256 05/09/15 0444 05/10/15 0426 05/11/15 0903 05/12/15 0934  NA 139 138 135 135 133*  K 4.5 4.1 3.8 3.7 3.8  CL 101 101 98* 96* 95*  CO2 32  GLUCOSE 126* 92 108* 94 122*  BUN 43* 41* 40* 41* 32*  CREATININE 2.96* 3.01* 2.86* 2.89* 2.32*  CALCIUM 9.4 9.3 9.2 9.5 9.3  PHOS  --   --   --  5.4* 3.7    Liver Function Tests:  Recent Labs Lab 05/11/15 0903 05/12/15 0934  ALBUMIN 2.8* 2.8*   No results for input(s): LIPASE, AMYLASE in the last 168  hours. No results for input(s): AMMONIA in the last 168 hours.  CBC:  Recent Labs Lab 05/07/15 2256 05/10/15 0426 05/11/15 0533 05/12/15 0934  WBC 9.6 7.5 6.2 11.4*  HGB 8.7* 7.8* 7.5* 8.7*  HCT 25.7* 23.0* 22.4* 26.2*  MCV 98.3 98.2 96.9 97.9  PLT 294 234 245 277    Cardiac Enzymes:  Recent Labs Lab 05/07/15 2256 05/08/15 0559 05/08/15 1212 05/08/15 1716  TROPONINI 0.11* 0.12* 0.08* 0.08*    BNP: Invalid input(s): POCBNP  CBG: No results for input(s): GLUCAP in the last 168 hours.  Microbiology: Results for orders placed or performed during the hospital encounter of 05/07/15  MRSA PCR Screening     Status: Abnormal   Collection Time: 05/08/15  7:50 AM  Result Value Ref Range Status   MRSA by PCR POSITIVE (A) NEGATIVE Final    Comment:        The GeneXpert MRSA Assay (FDA approved for NASAL specimens only), is one component of a comprehensive MRSA colonization surveillance program. It is not intended to diagnose MRSA infection nor to guide or monitor treatment for MRSA infections. CRITICAL RESULT CALLED TO, READ BACK BY AND VERIFIED WITH: JESSICA CHRISTMAS ON 05/08/15 AT 1022 BY QSD     Coagulation Studies: No results for input(s): LABPROT, INR in the last 72 hours.  Urinalysis: No results for input(s): COLORURINE, LABSPEC,  PHURINE, GLUCOSEU, HGBUR, BILIRUBINUR, KETONESUR, PROTEINUR, UROBILINOGEN, NITRITE, LEUKOCYTESUR in the last 72 hours.  Invalid input(s): APPERANCEUR    Imaging: No results found.   Medications:     . amLODipine  10 mg Oral Daily  . aspirin EC  81 mg Oral Daily  . carvedilol  12.5 mg Oral BID  . Chlorhexidine Gluconate Cloth  6 each Topical Q0600  . cloNIDine  0.3 mg Oral TID  . docusate sodium  100 mg Oral BID  . feeding supplement (ENSURE ENLIVE)  237 mL Oral TID WC  . fentaNYL  25 mcg Transdermal Q72H  . fluticasone  2 spray Each Nare Daily  . furosemide  40 mg Oral Daily  . gentamicin cream  1 application Topical  Daily  . heparin  1,000 Units Intracatheter Once  . hydrALAZINE  100 mg Oral QID  . iron polysaccharides  150 mg Oral Daily  . lidocaine  1 patch Transdermal Q24H  . mupirocin ointment  1 application Nasal BID  . nitroGLYCERIN  0.5 inch Topical 4 times per day  . pantoprazole  40 mg Oral BID  . phenytoin  300 mg Oral QHS  . predniSONE  10 mg Oral Q breakfast  . sodium chloride  3 mL Intravenous Q12H  . tuberculin  5 Units Intradermal Once   acetaminophen, diphenhydrAMINE, hydrALAZINE, labetalol, nitroGLYCERIN, ondansetron (ZOFRAN) IV, oxyCODONE  Assessment/ Plan:  [name] with past medical history of hypertension, obstructive sleep apnea, DJD, CVA with residual left sided weakness, carotid stenosis status post L CEA 2/15, seizure disorder, diastolic congestive heart failure, anemia  1. End Stage Renal Disease with proteinuria: progression of diease from Chronic kidney disease stage V. 24 hour creatinine clearance 12. - initiated on hemodialysis on 10/8 through right femoral temp HD catheter.  - She is to be discharged to skilled nursing facility where she will not be able to be trained on peritoneal dialysis. This will be done when she is discharged and back home.  - Outpatient planning: I believe patient has already been accepted [facility]. Will work with care management on discharge planning. She will need a tunneled catheter before discharge.    - discontinued sodium bicarbonate  - renally dose all medications - tolerating hemodialysis, third treatment for tomorrow.   2. Hypertension. Blood pressure at goal during hemodialysis treatment. Elevated due to volume and pain driven - home regimen of carvedilol, clonidine, diltiazem, furosemide hydralazine - now also on amlodipine  3. Secondary hyperparathyroidism. PTH 143 on 9/16. Calcium and phosphorus at goal  4. Anemia chronic kidney disease: hemoglobin 7.5.   - epo with treatment.     LOS: 3 [place] Wachter 10/9/201612:36 PM

## 2015-05-12 NOTE — Progress Notes (Signed)
Pt is alert and oriented, c/o back pain, oxycodone given with some relief. Continues on nasal cannula, monitoring blood pressure. Resting quietly.

## 2015-05-12 NOTE — Progress Notes (Signed)
Went to HD today and tolerated it well. Pt is screaming out in pain and unable to express where it is coming from. Zofran and Reglan were given for relief. Pain meds were given. Incontinent at times. Iso for MRSA in nares. 2 L of oxygen. NSR. MD [name] ordered no tele. Up to chair and screamed out that it was not comfortable, family request she stay in the chair 10 more min and placed in bed. Family at the bedside. Pt own med Flonase was sent to pharmacy for verify. Pt has no further concerns at this time.

## 2015-05-12 NOTE — Progress Notes (Signed)
05/12/2015 18:45  Per nursing supervisor request, spoke with Gorou, MD about pt's placement on the unit in reference to pt's earlier cardiac event.  MD said pt was appropriate for current placement and is to remain on the unit.   Bradly Chris, RN

## 2015-05-12 NOTE — Progress Notes (Signed)
Nursing Supervisor reported that pt had cardiac events in HD. CCU tele notified Charlie. Did not receive this in report from [facility]. Was notified of this after making pt no tele per MD [name]. Nursing Supervisor stated she would get in touch with MD to get off unit tele.

## 2015-05-13 LAB — BASIC METABOLIC PANEL
Anion gap: 9 (ref 5–15)
BUN: 21 mg/dL — ABNORMAL HIGH (ref 6–20)
CHLORIDE: 96 mmol/L — AB (ref 101–111)
CO2: 31 mmol/L (ref 22–32)
CREATININE: 1.79 mg/dL — AB (ref 0.44–1.00)
Calcium: 9 mg/dL (ref 8.9–10.3)
GFR calc non Af Amer: 24 mL/min — ABNORMAL LOW (ref >60)
GFR, EST AFRICAN AMERICAN: 28 mL/min — AB (ref >60)
Glucose, Bld: 99 mg/dL (ref 65–99)
POTASSIUM: 3.8 mmol/L (ref 3.5–5.1)
SODIUM: 136 mmol/L (ref 135–145)

## 2015-05-13 LAB — CBC
HEMATOCRIT: 24.9 % — AB (ref 35.0–47.0)
HEMOGLOBIN: 8.3 g/dL — AB (ref 12.0–16.0)
MCH: 32.6 pg (ref 26.0–34.0)
MCHC: 33.3 g/dL (ref 32.0–36.0)
MCV: 97.9 fL (ref 80.0–100.0)
Platelets: 260 10*3/uL (ref 150–440)
RBC: 2.54 MIL/uL — ABNORMAL LOW (ref 3.80–5.20)
RDW: 12.6 % (ref 11.5–14.5)
WBC: 12.9 10*3/uL — AB (ref 3.6–11.0)

## 2015-05-13 LAB — RENAL FUNCTION PANEL
ALBUMIN: 2.8 g/dL — AB (ref 3.5–5.0)
ANION GAP: 8 (ref 5–15)
BUN: 23 mg/dL — ABNORMAL HIGH (ref 6–20)
CHLORIDE: 95 mmol/L — AB (ref 101–111)
CO2: 31 mmol/L (ref 22–32)
Calcium: 9.3 mg/dL (ref 8.9–10.3)
Creatinine, Ser: 1.82 mg/dL — ABNORMAL HIGH (ref 0.44–1.00)
GFR, EST AFRICAN AMERICAN: 27 mL/min — AB (ref >60)
GFR, EST NON AFRICAN AMERICAN: 23 mL/min — AB (ref >60)
Glucose, Bld: 127 mg/dL — ABNORMAL HIGH (ref 65–99)
PHOSPHORUS: 3.1 mg/dL (ref 2.5–4.6)
POTASSIUM: 3.9 mmol/L (ref 3.5–5.1)
Sodium: 134 mmol/L — ABNORMAL LOW (ref 135–145)

## 2015-05-13 LAB — MAGNESIUM: MAGNESIUM: 1.8 mg/dL (ref 1.7–2.4)

## 2015-05-13 MED ORDER — EPOETIN ALFA 10000 UNIT/ML IJ SOLN: 10000.0000 [IU] | Freq: Once | INTRAMUSCULAR | Status: DC

## 2015-05-13 MED ORDER — HEPARIN SODIUM (PORCINE) 1000 UNIT/ML IJ SOLN
1000.0000 [IU] | Freq: Once | INTRAMUSCULAR | Status: AC
  Administered 2015-05-13: 3600 [IU]

## 2015-05-13 MED ORDER — BIMATOPROST 0.01 % OP SOLN
1.0000 [drp] | Freq: Two times a day (BID) | OPHTHALMIC | Status: DC
  Administered 2015-05-13 – 2015-05-22 (×15): 1 [drp] via OPHTHALMIC
  Filled 2015-05-13: qty 2.5

## 2015-05-13 MED ORDER — HYDRALAZINE HCL 50 MG PO TABS
50.0000 mg | ORAL_TABLET | Freq: Once | ORAL | Status: AC
  Administered 2015-05-13: 50 mg via ORAL
  Filled 2015-05-13: qty 1

## 2015-05-13 MED ORDER — NON FORMULARY: Freq: Two times a day (BID) | Status: DC

## 2015-05-13 MED ORDER — EPOETIN ALFA 10000 UNIT/ML IJ SOLN
10000.0000 [IU] | Freq: Once | INTRAMUSCULAR | Status: AC
  Administered 2015-05-13: 10000 [IU] via INTRAVENOUS

## 2015-05-13 NOTE — Clinical Social Work Note (Signed)
Patient has had bed offers and they have been extended to patient's daughter: Kristina Wilson this afternoon via phone. Patient has been off the unit for dialysis. Patient's daughter wishes for [facility] but if they are not able to offer, then they wish to accept [facility].  York Spaniel MSW,LCSW [phone]

## 2015-05-13 NOTE — Progress Notes (Signed)
PT Cancellation Note  Patient Details Name: Kristina Wilson MRN: [id number] DOB: [birth year]   Cancelled Treatment:    Reason Eval/Treat Not Completed: Patient at procedure or test/unavailable (Per chart review, patient noted with R temp fem cath (placed 10/7); currently off unit for dialysis.  Will continue efforts at later time/date as patient available and medically appropriate.)   Melia Hopes H. Manson Passey, PT, DPT, NCS 05/13/2015, 11:07 AM [phone]

## 2015-05-13 NOTE — Progress Notes (Signed)
[facility]  ROUNDING NOTE   Subjective:  Pt had third HD treatment today.  Tolerated well.  Still quite weak overall.   Objective:  Vital signs in last 24 hours:  Temp:  [97.7 F (36.5 C)-98.6 F (37 C)] 97.7 F (36.5 C) (10/10 1110) Pulse Rate:  [47-95] 92 (10/10 1415) Resp:  [12-24] 19 (10/10 1415) BP: (82-225)/(31-141) 141/73 mmHg (10/10 1415) SpO2:  [94 %-99 %] 96 % (10/10 1110) Weight:  [93.577 kg (206 lb 4.8 oz)] 93.577 kg (206 lb 4.8 oz) (10/10 0500)  Weight change: -0.907 kg (-2 lb) Filed Weights   05/11/15 0500 05/12/15 0451 05/13/15 0500  Weight: 95.482 kg (210 lb 8 oz) 94.484 kg (208 lb 4.8 oz) 93.577 kg (206 lb 4.8 oz)    Intake/Output: I/O last 3 completed shifts: In: 250 [P.O.:250] Out: 1601 [Urine:901; Other:700]   Intake/Output this shift:  Total I/O In: -  Out: 429 [Other:429]  Physical Exam: General: NAD  Head: Normocephalic, atraumatic. Moist oral mucosal membranes  Eyes: Anicteric  Neck: Supple, trachea midline  Lungs:  Clear to auscultation, normal effort  Heart: Regular rate and rhythm no rubs  Abdomen:  Soft, nontender, +peritoneal dialysis catheter  Extremities: trace peripheral edema.  Neurologic: Nonfocal, moving all four extremities  Skin: No lesions  Access: PD catheter, right femoral temp dialysis 10/7 Dr. Wyn Quaker    Basic Metabolic Panel:  Recent Labs Lab 05/10/15 0426 05/11/15 0903 05/12/15 0934 05/13/15 0547 05/13/15 0944  NA 135 135 133* 136 134*  K 3.8 3.7 3.8 3.8 3.9  CL 98* 96* 95* 96* 95*  CO2 29 30 32 31 31  GLUCOSE 108* 94 122* 99 127*  BUN 40* 41* 32* 21* 23*  CREATININE 2.86* 2.89* 2.32* 1.79* 1.82*  CALCIUM 9.2 9.5 9.3 9.0 9.3  MG  --   --   --  1.8  --   PHOS  --  5.4* 3.7  --  3.1    Liver Function Tests:  Recent Labs Lab 05/11/15 0903 05/12/15 0934 05/13/15 0944  ALBUMIN 2.8* 2.8* 2.8*   No results for input(s): LIPASE, AMYLASE in the last 168 hours. No results for input(s):  AMMONIA in the last 168 hours.  CBC:  Recent Labs Lab 05/07/15 2256 05/10/15 0426 05/11/15 0533 05/12/15 0934 05/13/15 0944  WBC 9.6 7.5 6.2 11.4* 12.9*  HGB 8.7* 7.8* 7.5* 8.7* 8.3*  HCT 25.7* 23.0* 22.4* 26.2* 24.9*  MCV 98.3 98.2 96.9 97.9 97.9  PLT 294 234 245 277 260    Cardiac Enzymes:  Recent Labs Lab 05/07/15 2256 05/08/15 0559 05/08/15 1212 05/08/15 1716  TROPONINI 0.11* 0.12* 0.08* 0.08*    BNP: Invalid input(s): POCBNP  CBG: No results for input(s): GLUCAP in the last 168 hours.  Microbiology: Results for orders placed or performed during the hospital encounter of 05/07/15  MRSA PCR Screening     Status: Abnormal   Collection Time: 05/08/15  7:50 AM  Result Value Ref Range Status   MRSA by PCR POSITIVE (A) NEGATIVE Final    Comment:        The GeneXpert MRSA Assay (FDA approved for NASAL specimens only), is one component of a comprehensive MRSA colonization surveillance program. It is not intended to diagnose MRSA infection nor to guide or monitor treatment for MRSA infections. CRITICAL RESULT CALLED TO, READ BACK BY AND VERIFIED WITH: JESSICA CHRISTMAS ON 05/08/15 AT 1022 BY QSD     Coagulation Studies: No results for input(s): LABPROT, INR in the  last 72 hours.  Urinalysis: No results for input(s): COLORURINE, LABSPEC, PHURINE, GLUCOSEU, HGBUR, BILIRUBINUR, KETONESUR, PROTEINUR, UROBILINOGEN, NITRITE, LEUKOCYTESUR in the last 72 hours.  Invalid input(s): APPERANCEUR    Imaging: No results found.   Medications:     . amLODipine  10 mg Oral Daily  . aspirin EC  81 mg Oral Daily  . bimatoprost  1 drop Both Eyes BID  . carvedilol  12.5 mg Oral BID  . cloNIDine  0.3 mg Oral TID  . docusate sodium  100 mg Oral BID  . feeding supplement (ENSURE ENLIVE)  237 mL Oral TID WC  . fentaNYL  25 mcg Transdermal Q72H  . fluticasone  2 spray Each Nare Daily  . furosemide  40 mg Oral Daily  . gentamicin cream  1 application Topical Daily  .  hydrALAZINE  100 mg Oral QID  . iron polysaccharides  150 mg Oral Daily  . lidocaine  1 patch Transdermal Q24H  . nitroGLYCERIN  0.5 inch Topical 4 times per day  . pantoprazole  40 mg Oral BID  . phenytoin  300 mg Oral QHS  . predniSONE  10 mg Oral Q breakfast  . sodium chloride  3 mL Intravenous Q12H   acetaminophen, diphenhydrAMINE, hydrALAZINE, labetalol, metoCLOPramide (REGLAN) injection, nitroGLYCERIN, ondansetron (ZOFRAN) IV, oxyCODONE  Assessment/ Plan:  Kristina Wilson is a 81 y.o. white female with past medical history of hypertension, obstructive sleep apnea, DJD, CVA with residual left sided weakness, carotid stenosis status post L CEA 2/15, seizure disorder, diastolic congestive heart failure, anemia  1. End Stage Renal Disease with proteinuria: progression of diease from Chronic kidney disease stage V. 24 hour creatinine clearance 12. Initiated on hemodialysis on 10/8 through right femoral temp HD catheter.  -Pt had HD today, tolerated well, can't yet transition to PD as shes transitioning care to a nursing home.  Will plan for HD again on Wednesday.   2. Hypertension. Labile blood pressures, continue amlodipine, coreg, clonidine, hydralazine.  3. Secondary hyperparathyroidism. Phos down to 3.1 with HD, no need for binders.   4. Anemia chronic kidney disease: continue epogen 10000 units IV with HD.    LOS: 4 Kasiyah Platter 10/10/20162:31 PM

## 2015-05-13 NOTE — Progress Notes (Signed)
Patient ID: Kristina Wilson, female   DOB: 14-Nov-1922, 81 y.o.   MRN: [id number] [facility] PROGRESS NOTE  ELENOR WILDES WUJ:[id number] DOB: 06/30/1923 DOA: 05/07/2015 PCP: Jaclyn Shaggy, MD  HPI/Subjective: Back pain and right hip pain.  Objective: Filed Vitals:   05/13/15 1415  BP: 141/73  Pulse: 92  Temp:   Resp: 19    Filed Weights   05/11/15 0500 05/12/15 0451 05/13/15 0500  Weight: 95.482 kg (210 lb 8 oz) 94.484 kg (208 lb 4.8 oz) 93.577 kg (206 lb 4.8 oz)    ROS: Review of Systems  Constitutional: Negative for fever and chills.  Eyes: Negative for blurred vision.  Respiratory: Negative for cough and shortness of breath.   Cardiovascular: Negative for chest pain.  Gastrointestinal: Negative for nausea, vomiting, abdominal pain, diarrhea and constipation.  Genitourinary: Negative for dysuria.  Musculoskeletal: Positive for back pain and joint pain.  Neurological: Negative for dizziness and headaches.   Exam: Physical Exam  Constitutional: She is oriented to person, place, and time.  HENT:  Nose: No mucosal edema.  Mouth/Throat: No oropharyngeal exudate or posterior oropharyngeal edema.  Eyes: Conjunctivae, EOM and lids are normal. Pupils are equal, round, and reactive to light.  Neck: No JVD present. Carotid bruit is not present. No edema present. No thyroid mass and no thyromegaly present.  Cardiovascular: S1 normal and S2 normal.  Exam reveals no gallop.   No murmur heard. Pulses:      Dorsalis pedis pulses are 2+ on the right side, and 2+ on the left side.  Respiratory: No respiratory distress. She has no wheezes. She has no rhonchi. She has no rales.  GI: Soft. Bowel sounds are normal. There is no tenderness.  Musculoskeletal: She exhibits no tenderness.       Right ankle: She exhibits swelling.       Left ankle: She exhibits swelling.  Pain in the right sacroiliac area and over lumbar spine to palpation.  Lymphadenopathy:    She has no  cervical adenopathy.  Neurological: She is alert and oriented to person, place, and time. No cranial nerve deficit.  Patient barely able to straight leg raise.  Skin: Skin is warm. Nails show no clubbing.  Chronic lower extremity discoloration bilaterally  Psychiatric: She has a normal mood and affect.     Data Reviewed: Basic Metabolic Panel:  Recent Labs Lab 05/10/15 0426 05/11/15 0903 05/12/15 0934 05/13/15 0547 05/13/15 0944  NA 135 135 133* 136 134*  K 3.8 3.7 3.8 3.8 3.9  CL 98* 96* 95* 96* 95*  CO2 29 30 32 31 31  GLUCOSE 108* 94 122* 99 127*  BUN 40* 41* 32* 21* 23*  CREATININE 2.86* 2.89* 2.32* 1.79* 1.82*  CALCIUM 9.2 9.5 9.3 9.0 9.3  MG  --   --   --  1.8  --   PHOS  --  5.4* 3.7  --  3.1   CBC:  Recent Labs Lab 05/07/15 2256 05/10/15 0426 05/11/15 0533 05/12/15 0934 05/13/15 0944  WBC 9.6 7.5 6.2 11.4* 12.9*  HGB 8.7* 7.8* 7.5* 8.7* 8.3*  HCT 25.7* 23.0* 22.4* 26.2* 24.9*  MCV 98.3 98.2 96.9 97.9 97.9  PLT 294 234 245 277 260   Cardiac Enzymes:  Recent Labs Lab 05/07/15 2256 05/08/15 0559 05/08/15 1212 05/08/15 1716  TROPONINI 0.11* 0.12* 0.08* 0.08*   BNP (last 3 results)  Recent Labs  02/02/15 2121 05/07/15 2256  BNP 228.0* 805.0*      Recent Results (  from the past 240 hour(s))  MRSA PCR Screening     Status: Abnormal   Collection Time: 05/08/15  7:50 AM  Result Value Ref Range Status   MRSA by PCR POSITIVE (A) NEGATIVE Final    Comment:        The GeneXpert MRSA Assay (FDA approved for NASAL specimens only), is one component of a comprehensive MRSA colonization surveillance program. It is not intended to diagnose MRSA infection nor to guide or monitor treatment for MRSA infections. CRITICAL RESULT CALLED TO, READ BACK BY AND VERIFIED WITH: JESSICA CHRISTMAS ON 05/08/15 AT 1022 BY QSD      Studies: No results found.  Scheduled Meds: . amLODipine  10 mg Oral Daily  . aspirin EC  81 mg Oral Daily  . bimatoprost  1  drop Both Eyes BID  . carvedilol  12.5 mg Oral BID  . cloNIDine  0.3 mg Oral TID  . docusate sodium  100 mg Oral BID  . [START ON 05/15/2015] epoetin (EPOGEN/PROCRIT) injection  10,000 Units Intravenous Once  . feeding supplement (ENSURE ENLIVE)  237 mL Oral TID WC  . fentaNYL  25 mcg Transdermal Q72H  . fluticasone  2 spray Each Nare Daily  . furosemide  40 mg Oral Daily  . gentamicin cream  1 application Topical Daily  . hydrALAZINE  100 mg Oral QID  . iron polysaccharides  150 mg Oral Daily  . lidocaine  1 patch Transdermal Q24H  . nitroGLYCERIN  0.5 inch Topical 4 times per day  . pantoprazole  40 mg Oral BID  . phenytoin  300 mg Oral QHS  . predniSONE  10 mg Oral Q breakfast  . sodium chloride  3 mL Intravenous Q12H    Assessment/Plan:  1.   Accelerated hypertension- blood pressure is controlled. Continue IV hydralazine and labetalol when necessary. Continue Norvasc, hydralazine, Lasix, clonidine and increased Coreg to 12.5 mg twice a day. 2.   Back pain- completed doses of IV Solu-Medrol. Trial of oxycodone. Increased fentanyl patch. Patient wanted to hold off on imaging studies at this point. 3.   Chest pain and borderline troponin- labeled as demand ischemia by cardiology. Conservative management. Continue aspirin. 4.   Chronic pain- increased fentanyl patch 5.   Gastroesophageal reflux disease without esophagitis continue Protonix 6.   Chronic kidney disease stage V, progression to ESRD. started hemodialysis yesterday and continue today per Dr. Wynelle Link. discontinued sodium bicarbonate. Per Dr. Cherylann Ratel, can't yet transition to PD as shes transitioning care to a nursing home. Will plan for HD again on Wednesday.  7.   History of seizure disorder on phenytoin 8.   Weakness- per physical therapy evaluation, the patient needs skilled nursing facility placement.  9. Chronic diastolic CHF (congestive heart failure) ejection fraction 55-60%. Stable.  Code Status:     Code Status  Orders        Start     Ordered   05/08/15 0514  Full code   Continuous     05/08/15 0513     Family Communication: Spoke with daughter and granddaughter. Disposition Plan: Possible discharge to skilled nursing facility in 1-2 days.  Consultants:  Cardiology  Nephrology  Time spent: 35 minutes  Shaune Pollack  [facility]              Data Reviewed: Basic Metabolic Panel:  Recent Labs Lab 05/10/15 0426 05/11/15 0903 05/12/15 0934 05/13/15 0547 05/13/15 0944  NA 135 135 133* 136 134*  K 3.8 3.7  3.8 3.8 3.9  CL 98* 96* 95* 96* 95*  CO2 29 30 32 31 31  GLUCOSE 108* 94 122* 99 127*  BUN 40* 41* 32* 21* 23*  CREATININE 2.86* 2.89* 2.32* 1.79* 1.82*  CALCIUM 9.2 9.5 9.3 9.0 9.3  MG  --   --   --  1.8  --   PHOS  --  5.4* 3.7  --  3.1   CBC:  Recent Labs Lab 05/07/15 2256 05/10/15 0426 05/11/15 0533 05/12/15 0934 05/13/15 0944  WBC 9.6 7.5 6.2 11.4* 12.9*  HGB 8.7* 7.8* 7.5* 8.7* 8.3*  HCT 25.7* 23.0* 22.4* 26.2* 24.9*  MCV 98.3 98.2 96.9 97.9 97.9  PLT 294 234 245 277 260   Cardiac Enzymes:  Recent Labs Lab 05/07/15 2256 05/08/15 0559 05/08/15 1212 05/08/15 1716  TROPONINI 0.11* 0.12* 0.08* 0.08*   BNP (last 3 results)  Recent Labs  02/02/15 2121 05/07/15 2256  BNP 228.0* 805.0*      Recent Results (from the past 240 hour(s))  MRSA PCR Screening     Status: Abnormal   Collection Time: 05/08/15  7:50 AM  Result Value Ref Range Status   MRSA by PCR POSITIVE (A) NEGATIVE Final    Comment:        The GeneXpert MRSA Assay (FDA approved for NASAL specimens only), is one component of a comprehensive MRSA colonization surveillance program. It is not intended to diagnose MRSA infection nor to guide or monitor treatment for MRSA infections. CRITICAL RESULT CALLED TO, READ BACK BY AND VERIFIED WITH: JESSICA CHRISTMAS ON 05/08/15 AT 1022 BY QSD      Studies: No results found.  Scheduled Meds: . amLODipine  10 mg  Oral Daily  . aspirin EC  81 mg Oral Daily  . bimatoprost  1 drop Both Eyes BID  . carvedilol  12.5 mg Oral BID  . cloNIDine  0.3 mg Oral TID  . docusate sodium  100 mg Oral BID  . [START ON 05/15/2015] epoetin (EPOGEN/PROCRIT) injection  10,000 Units Intravenous Once  . feeding supplement (ENSURE ENLIVE)  237 mL Oral TID WC  . fentaNYL  25 mcg Transdermal Q72H  . fluticasone  2 spray Each Nare Daily  . furosemide  40 mg Oral Daily  . gentamicin cream  1 application Topical Daily  . hydrALAZINE  100 mg Oral QID  . iron polysaccharides  150 mg Oral Daily  . lidocaine  1 patch Transdermal Q24H  . nitroGLYCERIN  0.5 inch Topical 4 times per day  . pantoprazole  40 mg Oral BID  . phenytoin  300 mg Oral QHS  . predniSONE  10 mg Oral Q breakfast  . sodium chloride  3 mL Intravenous Q12H    Assessment/Plan:  1.   Accelerated hypertension- blood pressure is still not controlled. Continue IV hydralazine and labetalol when necessary. Continue Norvasc, hydralazine, Lasix, clonidine and increase Coreg to 12.5 mg twice a day. 2.   Back pain- completed doses of IV Solu-Medrol. Trial of oxycodone. Increased fentanyl patch. Patient wanted to hold off on imaging studies at this point. 3.   Chest pain and borderline troponin- labeled as demand ischemia by cardiology. Conservative management. Continue aspirin. 4.   Chronic pain- increased fentanyl patch 5.   Gastroesophageal reflux disease without esophagitis continue Protonix 6.   Chronic kidney disease stage IV, progression of diease from Chronic kidney disease stage V. start hemodialysis today and possible peritoneal dialysis after discharge. discontinue sodium bicarbonate. 6.   History of  seizure disorder on phenytoin 7.   Weakness- per physical therapy evaluation, the patient needs skilled nursing facility placement.  8. Chronic diastolic CHF (congestive heart failure) ejection fraction 55-60%. Stable.  Code Status:     Code Status Orders         Start     Ordered   05/08/15 0514  Full code   Continuous     05/08/15 0513     Family Communication: Spoke with daughter and granddaughter. Disposition Plan: Possible discharge to skilled nursing facility in 2 days.  Consultants:  Cardiology  Nephrology  Time spent: 35 minutes  Shaune Pollack  [facility]

## 2015-05-13 NOTE — Progress Notes (Signed)
Nutrition Follow-up   INTERVENTION:  Meals and snacks: Cater to pt preferences Medical Nutrition Supplement Therapy: continue ensure at this time, may need to consider nepro pending intake and labs.    NUTRITION DIAGNOSIS:   Inadequate oral intake related to acute illness as evidenced by per patient/family report, being addressed with supplement and po diet    GOAL:   Patient will meet greater than or equal to 90% of their needs    MONITOR:    (Energy Intake, Electrolyte and renal Profile, Anthropometrics)  REASON FOR ASSESSMENT:   Malnutrition Screening Tool    ASSESSMENT:   Pt admitted with headache and high BP secondary to HTN.   Pt starting on HD, third treatment today.     Current Nutrition: pt in dialysis during rounds this am.  Noted per chart 0-50% of meals consumed during admission   Gastrointestinal Profile: Last BM: 10/8   Medications: reviewed  Electrolyte/Renal Profile and Glucose Profile:   Recent Labs Lab 05/11/15 0903 05/12/15 0934 05/13/15 0547 05/13/15 0944  NA 135 133* 136 134*  K 3.7 3.8 3.8 3.9  CL 96* 95* 96* 95*  CO2 30 32 31 31  BUN 41* 32* 21* 23*  CREATININE 2.89* 2.32* 1.79* 1.82*  CALCIUM 9.5 9.3 9.0 9.3  MG  --   --  1.8  --   PHOS 5.4* 3.7  --  3.1  GLUCOSE 94 122* 99 127*   Protein Profile:  Recent Labs Lab 05/11/15 0903 05/12/15 0934 05/13/15 0944  ALBUMIN 2.8* 2.8* 2.8*      Weight Trend since Admission: Filed Weights   05/11/15 0500 05/12/15 0451 05/13/15 0500  Weight: 210 lb 8 oz (95.482 kg) 208 lb 4.8 oz (94.484 kg) 206 lb 4.8 oz (93.577 kg)      Diet Order:  Diet renal with fluid restriction Fluid restriction:: 1200 mL Fluid; Room service appropriate?: Yes; Fluid consistency:: Thin  Skin:  Reviewed, no issues   Height:   Ht Readings from Last 1 Encounters:  05/09/15  (1.626 m)    Weight:   Wt Readings from Last 1 Encounters:  05/13/15 206 lb 4.8 oz (93.577 kg)         BMI:   Body mass index is 35.39 kg/(m^2).  Estimated Nutritional Needs:   Kcal:  using IBW of 54.6kg, BEE: 951kcals, TEE: (IF 1.1-1.3)(AF 1.2) 1255-1484kcals  Protein:  55-65g protein (1.0-1.2g/kg)  Fluid:  1365-1652mL of fluid (25-40mL/kg)  EDUCATION NEEDS:   No education needs identified at this time  MODERATE Care Level  Berthel Bagnall B. Freida Busman, RD, LDN [phone] (pager)

## 2015-05-13 NOTE — Progress Notes (Signed)
Dystolic pressure is 43.  There is an order to notify the MD if it is less than 60.  Dr. Elisabeth Pigeon was notified of this and was also asked if the patient should receive the scheduled hydralazine  and nitroglycerin ointment.  He states not to give the nitro but give only  of hydralazine.

## 2015-05-13 NOTE — Clinical Social Work Note (Signed)
CSW awaiting an outpatient hemodialysis schedule and for patient to have a non temporary access for outpatient HD. [facility] has stated that if patient cannot transport via wheelchair, that they will have to decline patient at this time. Patient's daughter is aware and is accepting the offer from [facility]. York Spaniel MSW,LCSW [phone]

## 2015-05-13 NOTE — Progress Notes (Signed)
Dr Sheryle Hail notified pt had 10beat run Vtach, pt restless agitated nauseated, stated would monitor labs.Hr 142 down to 95.

## 2015-05-14 ENCOUNTER — Encounter
Admission: EM | Disposition: A | Discharge status: Skilled Nursing Facility | Payer: Self-pay | Source: Home / Self Care | Attending: Internal Medicine

## 2015-05-14 ENCOUNTER — Encounter: Payer: Self-pay | Admitting: *Deleted

## 2015-05-14 LAB — GLUCOSE, CAPILLARY: Glucose-Capillary: 98 mg/dL (ref 65–99)

## 2015-05-14 SURGERY — DIALYSIS/PERMA CATHETER INSERTION
Anesthesia: Moderate Sedation

## 2015-05-14 MED ORDER — HEPARIN SODIUM (PORCINE) 5000 UNIT/ML IJ SOLN
5000.0000 [IU] | Freq: Three times a day (TID) | INTRAMUSCULAR | Status: DC
  Administered 2015-05-14 – 2015-05-22 (×22): 5000 [IU] via SUBCUTANEOUS
  Filled 2015-05-14 (×22): qty 1

## 2015-05-14 MED ORDER — CEFAZOLIN SODIUM 1-5 GM-% IV SOLN
1.0000 g | INTRAVENOUS | Status: AC
  Filled 2015-05-14: qty 50

## 2015-05-14 MED ORDER — SODIUM CHLORIDE 0.9 % IV SOLN
Freq: Once | INTRAVENOUS | Status: AC
  Administered 2015-05-14: 14:00:00 via INTRAVENOUS

## 2015-05-14 MED ORDER — CARVEDILOL 12.5 MG PO TABS
25.0000 mg | ORAL_TABLET | Freq: Two times a day (BID) | ORAL | Status: DC
  Administered 2015-05-14 – 2015-05-20 (×10): 25 mg via ORAL
  Filled 2015-05-14 (×12): qty 2

## 2015-05-14 NOTE — Progress Notes (Signed)
Pre-hd tx 

## 2015-05-14 NOTE — Progress Notes (Signed)
[facility]  Daily Progress Note   Subjective  - Day of Surgery  Patient brought to special procedures preoperative following her dialysis. She is noted to be lethargic and on the monitor is having ventricular arrhythmias.  Objective Filed Vitals:   05/14/15 1342 05/14/15 1404 05/14/15 1639 05/14/15 1645  BP: 177/50 204/61 194/60 212/69  Pulse: 89 87 96 90  Temp: 97.3 F (36.3 C)  98.7 F (37.1 C)   TempSrc: Oral  Oral   Resp: Height:      Weight: 92.5 kg (203 lb 14.8 oz)     SpO2: 89% 97% 90%     Intake/Output Summary (Last 24 hours) at 05/14/15 1653 Last data filed at 05/14/15 1342  Gross per 24 hour  Intake    357 ml  Output   1000 ml  Net   -643 ml    PULM  Normal effort , no use of accessory muscles CV  No JVD, RRR Abd      No distended, nontender 80 catheter intact VASC  temporary dialysis catheter clean dry and intact  Laboratory CBC    Component Value Date/Time   WBC 12.9* 05/13/2015 0944   WBC 9.5 11/21/2014 0446   HGB 8.3* 05/13/2015 0944   HGB 8.0* 11/21/2014 0446   HCT 24.9* 05/13/2015 0944   HCT 24.4* 11/21/2014 0446   PLT 260 05/13/2015 0944   PLT 259 11/21/2014 0446    BMET    Component Value Date/Time   NA 134* 05/13/2015 0944   NA 134* 11/21/2014 0446   K 3.9 05/13/2015 0944   K 5.0 11/21/2014 0446   CL 95* 05/13/2015 0944   CL 106 11/21/2014 0446   CO2 31 05/13/2015 0944   CO2 25 11/21/2014 0446   GLUCOSE 127* 05/13/2015 0944   GLUCOSE 92 11/21/2014 0446   BUN 23* 05/13/2015 0944   BUN 53* 11/21/2014 0446   CREATININE 1.82* 05/13/2015 0944   CREATININE 2.14* 11/21/2014 0446   CALCIUM 9.3 05/13/2015 0944   CALCIUM 9.0 11/21/2014 0446   GFRNONAA 23* 05/13/2015 0944   GFRNONAA 20* 11/21/2014 0446   GFRAA 27* 05/13/2015 0944   GFRAA 23* 11/21/2014 0446    Assessment/Planning: 1.  Ventricular arrhythmia: Medicine has been contacted. They've requested cardiology received her we will follow up  with Dr. Gwen Pounds. This is likely secondary to electrolyte shifts during dialysis.  2.  End-stage renal disease requiring hemodialysis:  Patient will continue dialysis therapy with her temporary catheter for now without further interruption.  I will not place the tunneled catheter at this time given her change in cardiac rhythm, pending cardiology's opinion plans for a tunneled catheter can be made for tomorrow or Thursday 3.  Hypertension:  Patient will continue medical management; nephrology is following no changes in oral medications. 4. Diabetes mellitus:  Glucose will be monitored and oral medications been held this morning once the patient has undergone the patient's procedure po intake will be reinitiated and again Accu-Cheks will be used to assess the blood glucose level and treat as needed. The patient will be restarted on the patient's usual hypoglycemic regime 5.  Coronary artery disease:  EKG will be monitored. Nitrates will be used if needed. The patient's oral cardiac medications will be continued.    Katelynd Blauvelt, Latina Craver  05/14/2015, 4:53 PM

## 2015-05-14 NOTE — Progress Notes (Signed)
Dr. [name] notified of unstable changes in BP and telemetry patterns. Transfer pt to 2A per MD orders.

## 2015-05-14 NOTE — Progress Notes (Addendum)
Patient ID: Kristina Wilson, female   DOB: Jul 12, 1923, 81 y.o.   MRN: [id number] [facility] PROGRESS NOTE  [name] Wilson:[id number] DOB: 08-Jan-1923 DOA: 05/07/2015 PCP: Jaclyn Shaggy, MD  HPI/Subjective: Better back pain and right hip pain.  Objective: Filed Vitals:   05/14/15 1230  BP: 182/55  Pulse: 102  Temp:   Resp: 16    Filed Weights   05/13/15 0500 05/14/15 0655 05/14/15 0930  Weight: 93.577 kg (206 lb 4.8 oz) 94.121 kg (207 lb 8 oz) 94 kg (207 lb 3.7 oz)    ROS: Review of Systems  Constitutional: Negative for fever and chills.  Eyes: Negative for blurred vision.  Respiratory: Negative for cough and shortness of breath.   Cardiovascular: Negative for chest pain.  Gastrointestinal: Negative for nausea, vomiting, abdominal pain, diarrhea and constipation.  Genitourinary: Negative for dysuria.  Musculoskeletal: Positive for back pain and joint pain.  Neurological: Negative for dizziness and headaches.   Exam: Physical Exam  Constitutional: She is oriented to person, place, and time.  HENT:  Nose: No mucosal edema.  Mouth/Throat: No oropharyngeal exudate or posterior oropharyngeal edema.  Eyes: Conjunctivae, EOM and lids are normal. Pupils are equal, round, and reactive to light.  Neck: No JVD present. Carotid bruit is not present. No edema present. No thyroid mass and no thyromegaly present.  Cardiovascular: S1 normal and S2 normal.  Exam reveals no gallop.   No murmur heard. Pulses:      Dorsalis pedis pulses are 2+ on the right side, and 2+ on the left side.  Respiratory: No respiratory distress. She has no wheezes. She has no rhonchi. She has no rales.  GI: Soft. Bowel sounds are normal. There is no tenderness.  Musculoskeletal: She exhibits no tenderness.       Right ankle: She exhibits no swelling.       Left ankle: She exhibits no swelling.  Pain in the right sacroiliac area and over lumbar spine to palpation.  Lymphadenopathy:   She has no cervical adenopathy.  Neurological: She is alert and oriented to person, place, and time. No cranial nerve deficit.  Patient barely able to straight leg raise.  Skin: Skin is warm. Nails show no clubbing.  Chronic lower extremity discoloration bilaterally  Psychiatric: She has a normal mood and affect.     Data Reviewed: Basic Metabolic Panel:  Recent Labs Lab 05/10/15 0426 05/11/15 0903 05/12/15 0934 05/13/15 0547 05/13/15 0944  NA 135 135 133* 136 134*  K 3.8 3.7 3.8 3.8 3.9  CL 98* 96* 95* 96* 95*  CO2 29 30 32 31 31  GLUCOSE 108* 94 122* 99 127*  BUN 40* 41* 32* 21* 23*  CREATININE 2.86* 2.89* 2.32* 1.79* 1.82*  CALCIUM 9.2 9.5 9.3 9.0 9.3  MG  --   --   --  1.8  --   PHOS  --  5.4* 3.7  --  3.1   CBC:  Recent Labs Lab 05/07/15 2256 05/10/15 0426 05/11/15 0533 05/12/15 0934 05/13/15 0944  WBC 9.6 7.5 6.2 11.4* 12.9*  HGB 8.7* 7.8* 7.5* 8.7* 8.3*  HCT 25.7* 23.0* 22.4* 26.2* 24.9*  MCV 98.3 98.2 96.9 97.9 97.9  PLT 294 234 245 277 260   Cardiac Enzymes:  Recent Labs Lab 05/07/15 2256 05/08/15 0559 05/08/15 1212 05/08/15 1716  TROPONINI 0.11* 0.12* 0.08* 0.08*   BNP (last 3 results)  Recent Labs  02/02/15 2121 05/07/15 2256  BNP 228.0* 805.0*      Recent  Results (from the past 240 hour(s))  MRSA PCR Screening     Status: Abnormal   Collection Time: 05/08/15  7:50 AM  Result Value Ref Range Status   MRSA by PCR POSITIVE (A) NEGATIVE Final    Comment:        The GeneXpert MRSA Assay (FDA approved for NASAL specimens only), is one component of a comprehensive MRSA colonization surveillance program. It is not intended to diagnose MRSA infection nor to guide or monitor treatment for MRSA infections. CRITICAL RESULT CALLED TO, READ BACK BY AND VERIFIED WITH: JESSICA CHRISTMAS ON 05/08/15 AT 1022 BY QSD      Studies: No results found.  Scheduled Meds: . amLODipine  10 mg Oral Daily  . aspirin EC  81 mg Oral Daily  .  bimatoprost  1 drop Both Eyes BID  . carvedilol  12.5 mg Oral BID  . [START ON 05/15/2015]  ceFAZolin (ANCEF) IV  1 g Intravenous On Call  . cloNIDine  0.3 mg Oral TID  . docusate sodium  100 mg Oral BID  . [START ON 05/15/2015] epoetin (EPOGEN/PROCRIT) injection  10,000 Units Intravenous Once  . feeding supplement (ENSURE ENLIVE)  237 mL Oral TID WC  . fentaNYL  25 mcg Transdermal Q72H  . fluticasone  2 spray Each Nare Daily  . furosemide  40 mg Oral Daily  . gentamicin cream  1 application Topical Daily  . hydrALAZINE  100 mg Oral QID  . iron polysaccharides  150 mg Oral Daily  . lidocaine  1 patch Transdermal Q24H  . nitroGLYCERIN  0.5 inch Topical 4 times per day  . pantoprazole  40 mg Oral BID  . phenytoin  300 mg Oral QHS  . predniSONE  10 mg Oral Q breakfast  . sodium chloride  3 mL Intravenous Q12H    Assessment/Plan:  1.   Accelerated hypertension- blood pressure is labile.  Continue IV hydralazine and labetalol when necessary. Continue Norvasc, hydralazine, Lasix, clonidine and Coreg to 12.5 mg twice a day. 2.   Back pain- completed doses of IV Solu-Medrol. Trial of oxycodone. Increased fentanyl patch. Patient wanted to hold off on imaging studies at this point. 3.   Chest pain and borderline troponin- labeled as demand ischemia by cardiology. Conservative management. Continue aspirin. 4.   Chronic pain- increased fentanyl patch 5.   Gastroesophageal reflux disease without esophagitis continue Protonix 6.   Chronic kidney disease stage V, progression to ESRD. started hemodialysis on 10/8. Per Dr. Cherylann Ratel, can't yet transition to PD as shes transitioning care to a nursing home. Get vascular surgery consult for perma cath for HD.  7.   History of seizure disorder on phenytoin 8.   Weakness- per physical therapy evaluation, the patient needs skilled nursing facility placement.  9. Chronic diastolic CHF (congestive heart failure) ejection fraction 55-60%. Stable.  I discussed  with Dr. Cherylann Ratel. Code Status:     Code Status Orders        Start     Ordered   05/08/15 0514  Full code   Continuous     05/08/15 0513     Family Communication: Spoke with daughter and granddaughter. Disposition Plan: Possible discharge to skilled nursing facility in 1-2 days.  Consultants:  Cardiology  Nephrology  Time spent: 35 minutes  Shaune Pollack  [facility]              Data Reviewed: Basic Metabolic Panel:  Recent Labs Lab 05/10/15 [phone] 05/11/15 1191 05/12/15 4782  05/13/15 0547 05/13/15 0944  NA 135 135 133* 136 134*  K 3.8 3.7 3.8 3.8 3.9  CL 98* 96* 95* 96* 95*  CO2 29 30 32 31 31  GLUCOSE 108* 94 122* 99 127*  BUN 40* 41* 32* 21* 23*  CREATININE 2.86* 2.89* 2.32* 1.79* 1.82*  CALCIUM 9.2 9.5 9.3 9.0 9.3  MG  --   --   --  1.8  --   PHOS  --  5.4* 3.7  --  3.1   CBC:  Recent Labs Lab 05/07/15 2256 05/10/15 0426 05/11/15 0533 05/12/15 0934 05/13/15 0944  WBC 9.6 7.5 6.2 11.4* 12.9*  HGB 8.7* 7.8* 7.5* 8.7* 8.3*  HCT 25.7* 23.0* 22.4* 26.2* 24.9*  MCV 98.3 98.2 96.9 97.9 97.9  PLT 294 234 245 277 260   Cardiac Enzymes:  Recent Labs Lab 05/07/15 2256 05/08/15 0559 05/08/15 1212 05/08/15 1716  TROPONINI 0.11* 0.12* 0.08* 0.08*   BNP (last 3 results)  Recent Labs  02/02/15 2121 05/07/15 2256  BNP 228.0* 805.0*      Recent Results (from the past 240 hour(s))  MRSA PCR Screening     Status: Abnormal   Collection Time: 05/08/15  7:50 AM  Result Value Ref Range Status   MRSA by PCR POSITIVE (A) NEGATIVE Final    Comment:        The GeneXpert MRSA Assay (FDA approved for NASAL specimens only), is one component of a comprehensive MRSA colonization surveillance program. It is not intended to diagnose MRSA infection nor to guide or monitor treatment for MRSA infections. CRITICAL RESULT CALLED TO, READ BACK BY AND VERIFIED WITH: JESSICA CHRISTMAS ON 05/08/15 AT 1022 BY QSD      Studies: No  results found.  Scheduled Meds: . amLODipine  10 mg Oral Daily  . aspirin EC  81 mg Oral Daily  . bimatoprost  1 drop Both Eyes BID  . carvedilol  12.5 mg Oral BID  . [START ON 05/15/2015]  ceFAZolin (ANCEF) IV  1 g Intravenous On Call  . cloNIDine  0.3 mg Oral TID  . docusate sodium  100 mg Oral BID  . [START ON 05/15/2015] epoetin (EPOGEN/PROCRIT) injection  10,000 Units Intravenous Once  . feeding supplement (ENSURE ENLIVE)  237 mL Oral TID WC  . fentaNYL  25 mcg Transdermal Q72H  . fluticasone  2 spray Each Nare Daily  . furosemide  40 mg Oral Daily  . gentamicin cream  1 application Topical Daily  . hydrALAZINE  100 mg Oral QID  . iron polysaccharides  150 mg Oral Daily  . lidocaine  1 patch Transdermal Q24H  . nitroGLYCERIN  0.5 inch Topical 4 times per day  . pantoprazole  40 mg Oral BID  . phenytoin  300 mg Oral QHS  . predniSONE  10 mg Oral Q breakfast  . sodium chloride  3 mL Intravenous Q12H    Assessment/Plan:  1.   Malignant hypertension- blood pressure is still not controlled. Continue IV hydralazine and labetalol when necessary. Continue Norvasc, hydralazine, Lasix, clonidine and increase Coreg to 25 mg twice a day. 2.   Back pain- completed doses of IV Solu-Medrol. Trial of oxycodone. Increased fentanyl patch. Patient wanted to hold off on imaging studies at this point. 3.   Chest pain and borderline troponin- labeled as demand ischemia by cardiology. Conservative management. Continue aspirin. 4.   Chronic pain- increased fentanyl patch 5.   Gastroesophageal reflux disease without esophagitis continue Protonix 6.   Chronic  kidney disease stage IV, progression of diease from Chronic kidney disease stage V. start hemodialysis today and possible peritoneal dialysis after discharge. discontinue sodium bicarbonate. 6.   History of seizure disorder on phenytoin 7.   Weakness- per physical therapy evaluation, the patient needs skilled nursing facility placement.  8.  Chronic diastolic CHF (congestive heart failure) ejection fraction 55-60%. Stable.  Code Status:     Code Status Orders        Start     Ordered   05/08/15 0514  Full code   Continuous     05/08/15 0513     Family Communication: Spoke with daughter and granddaughter. Disposition Plan: Possible discharge to skilled nursing facility in 2 days.  Consultants:  Cardiology  Nephrology  Time spent: 35 minutes  Shaune Pollack  [facility]

## 2015-05-14 NOTE — Progress Notes (Signed)
Report given to oncoming nurse. Night nurse aware of all BP meds given to pt during my shift. PT AAOX3 resting resp even

## 2015-05-14 NOTE — Progress Notes (Signed)
Hydralazine IV given for BP 180/50 and HR 112, will continue to monitor

## 2015-05-14 NOTE — Progress Notes (Signed)
Pt noted these am with elevated systolic B/P at 203. Per M.D. order Hydralazine   IV given prn . Follow up B/P to be taken, Dayshift nurse made aware. Pt has no c/o of chest pain at this time. Lidoderm patch placed to right hip. Call light and phone placed with in reach. Bed exit alarm placed. Noted per Tele tech on monitor pt noted with Vent Bigemy rate between 73-88.

## 2015-05-14 NOTE — Progress Notes (Signed)
HD tx start 

## 2015-05-14 NOTE — OR Nursing (Signed)
Afib, trigeminal PVC's, PAC's, conduction changes noted on cardiac monitor pre operatively.  Dr. Gilda Crease notified of the above.  Orders to page medicine to see patient.  Dr. [name] paged and notified of the above  He states cardiology consulted who dismissed any further workup.  Patient stable.  Will transfer to floor at this time

## 2015-05-14 NOTE — Progress Notes (Signed)
Post hd tx 

## 2015-05-14 NOTE — Progress Notes (Signed)
PT Cancellation Note  Patient Details Name: JENEEN DOUTT MRN: [id number] DOB: [birth year]   Cancelled Treatment:    Reason Eval/Treat Not Completed: Patient at procedure or test/unavailable (Treatment session attempted; patient, again, off unit for dialysis.  Will re-attempt at later time/date as medically appropriate and available.)   Reshanda Lewey H. Manson Passey, PT, DPT, NCS 05/14/2015, 10:43 AM [phone]

## 2015-05-14 NOTE — Progress Notes (Signed)
[facility]  ROUNDING NOTE   Subjective:  Pt seen during HD treatment today. BFR 300. States she still has malaise and not feeling the best.   Objective:  Vital signs in last 24 hours:  Temp:  [97.7 F (36.5 C)-99.9 F (37.7 C)] 98.4 F (36.9 C) (10/11 0930) Pulse Rate:  [56-95] 92 (10/11 1030) Resp:  [12-24] 14 (10/11 1030) BP: (82-206)/(31-73) 180/64 mmHg (10/11 1030) SpO2:  [93 %-99 %] 96 % (10/11 1030) Weight:  [94 kg (207 lb 3.7 oz)-94.121 kg (207 lb 8 oz)] 94 kg (207 lb 3.7 oz) (10/11 0930)  Weight change: 0.544 kg (1 lb 3.2 oz) Filed Weights   05/13/15 0500 05/14/15 0655 05/14/15 0930  Weight: 93.577 kg (206 lb 4.8 oz) 94.121 kg (207 lb 8 oz) 94 kg (207 lb 3.7 oz)    Intake/Output: I/O last 3 completed shifts: In: 477 [P.O.:477] Out: 430 [Urine:1; Other:429]   Intake/Output this shift:     Physical Exam: General: NAD  Head: Normocephalic, atraumatic. Moist oral mucosal membranes  Eyes: Anicteric  Neck: Supple, trachea midline  Lungs:  Clear to auscultation, normal effort  Heart: Irregular, no rubs  Abdomen:  Soft, nontender, +peritoneal dialysis catheter  Extremities: trace peripheral edema.  Neurologic: Nonfocal, moving all four extremities  Skin: No lesions  Access: PD catheter, right femoral temp dialysis 10/7 Dr. Wyn Wilson    Basic Metabolic Panel:  Recent Labs Lab 05/10/15 0426 05/11/15 0903 05/12/15 0934 05/13/15 0547 05/13/15 0944  NA 135 135 133* 136 134*  K 3.8 3.7 3.8 3.8 3.9  CL 98* 96* 95* 96* 95*  CO2 29 30 32 31 31  GLUCOSE 108* 94 122* 99 127*  BUN 40* 41* 32* 21* 23*  CREATININE 2.86* 2.89* 2.32* 1.79* 1.82*  CALCIUM 9.2 9.5 9.3 9.0 9.3  MG  --   --   --  1.8  --   PHOS  --  5.4* 3.7  --  3.1    Liver Function Tests:  Recent Labs Lab 05/11/15 0903 05/12/15 0934 05/13/15 0944  ALBUMIN 2.8* 2.8* 2.8*   No results for input(s): LIPASE, AMYLASE in the last 168 hours. No results for input(s): AMMONIA in the last  168 hours.  CBC:  Recent Labs Lab 05/07/15 2256 05/10/15 0426 05/11/15 0533 05/12/15 0934 05/13/15 0944  WBC 9.6 7.5 6.2 11.4* 12.9*  HGB 8.7* 7.8* 7.5* 8.7* 8.3*  HCT 25.7* 23.0* 22.4* 26.2* 24.9*  MCV 98.3 98.2 96.9 97.9 97.9  PLT 294 234 245 277 260    Cardiac Enzymes:  Recent Labs Lab 05/07/15 2256 05/08/15 0559 05/08/15 1212 05/08/15 1716  TROPONINI 0.11* 0.12* 0.08* 0.08*    BNP: Invalid input(s): POCBNP  CBG:  Recent Labs Lab 05/14/15 0735  GLUCAP 98    Microbiology: Results for orders placed or performed during the hospital encounter of 05/07/15  MRSA PCR Screening     Status: Abnormal   Collection Time: 05/08/15  7:50 AM  Result Value Ref Range Status   MRSA by PCR POSITIVE (A) NEGATIVE Final    Comment:        The GeneXpert MRSA Assay (FDA approved for NASAL specimens only), is one component of a comprehensive MRSA colonization surveillance program. It is not intended to diagnose MRSA infection nor to guide or monitor treatment for MRSA infections. CRITICAL RESULT CALLED TO, READ BACK BY AND VERIFIED WITH: Kristina Wilson ON 05/08/15 AT 1022 BY QSD     Coagulation Studies: No results for input(s): LABPROT,  INR in the last 72 hours.  Urinalysis: No results for input(s): COLORURINE, LABSPEC, PHURINE, GLUCOSEU, HGBUR, BILIRUBINUR, KETONESUR, PROTEINUR, UROBILINOGEN, NITRITE, LEUKOCYTESUR in the last 72 hours.  Invalid input(s): APPERANCEUR    Imaging: No results found.   Medications:     . amLODipine  10 mg Oral Daily  . aspirin EC  81 mg Oral Daily  . bimatoprost  1 drop Both Eyes BID  . carvedilol  12.5 mg Oral BID  . cloNIDine  0.3 mg Oral TID  . docusate sodium  100 mg Oral BID  . [START ON 05/15/2015] epoetin (EPOGEN/PROCRIT) injection  10,000 Units Intravenous Once  . feeding supplement (ENSURE ENLIVE)  237 mL Oral TID WC  . fentaNYL  25 mcg Transdermal Q72H  . fluticasone  2 spray Each Nare Daily  . furosemide  40  mg Oral Daily  . gentamicin cream  1 application Topical Daily  . hydrALAZINE  100 mg Oral QID  . iron polysaccharides  150 mg Oral Daily  . lidocaine  1 patch Transdermal Q24H  . nitroGLYCERIN  0.5 inch Topical 4 times per day  . pantoprazole  40 mg Oral BID  . phenytoin  300 mg Oral QHS  . predniSONE  10 mg Oral Q breakfast  . sodium chloride  3 mL Intravenous Q12H   acetaminophen, diphenhydrAMINE, hydrALAZINE, labetalol, metoCLOPramide (REGLAN) injection, nitroGLYCERIN, ondansetron (ZOFRAN) IV, oxyCODONE  Assessment/ Plan:  [name] with past medical history of hypertension, obstructive sleep apnea, DJD, CVA with residual left sided weakness, carotid stenosis status post L CEA 2/15, seizure disorder, diastolic congestive heart failure, anemia  1. End Stage Renal Disease with proteinuria: progression of diease from Chronic kidney disease stage V. 24 hour creatinine clearance 12. Initiated on hemodialysis on 10/8 through right femoral temp HD catheter.  -Pt seen during HD today, still not feeling the best, but has significant other comorbidities. Will need a permcath, will discuss this with vascular surgery.  2. Hypertension. Has history of labile blood pressure. For now continue amlodipine, coreg, clonidine, hydralazine.  3. Secondary hyperparathyroidism. Phos was acceptable at last check, no indication for binders.   4. Anemia chronic kidney disease: hgb low at 8.3, h as been started on epogen 10000 units IV with HD.    LOS: 5 Kristina Wilson 10/11/201611:44 AM

## 2015-05-14 NOTE — Progress Notes (Signed)
BP has elevated BP . PO meds given for BP as she did not received earlier due to Dialysis . Will recheck meds in half an hour and if is still elevated will give PRN meds for BP

## 2015-05-14 NOTE — Progress Notes (Signed)
Revived from [place], [place] . Bed and pad changed. Noted Right under breast mole like size of half dollar coin. O2 at 2L [place], IV HL x2  Dialysis shunts at abd dressing intact and to right groin area intact.  Edema and discoloration to lower extreme ties bilaterally , Telemetry applied and notified front  Desk.  Calling light within reach. Will continue to monitor

## 2015-05-14 NOTE — Progress Notes (Signed)
Dr Sherryll Burger was made aware of Pt's high BP after giving all BP po meds and Hydralazine IV . Will give  Labetalol IV now  as per Dr Sherryll Burger order.

## 2015-05-15 DIAGNOSIS — L8990 Pressure ulcer of unspecified site, unspecified stage: Secondary | ICD-10-CM | POA: Insufficient documentation

## 2015-05-15 MED ORDER — CHLORHEXIDINE GLUCONATE CLOTH 2 % EX PADS
6.0000 | MEDICATED_PAD | Freq: Every day | CUTANEOUS | Status: AC
  Administered 2015-05-16 – 2015-05-20 (×5): 6 via TOPICAL

## 2015-05-15 MED ORDER — MUPIROCIN 2 % EX OINT
1.0000 "application " | TOPICAL_OINTMENT | Freq: Two times a day (BID) | CUTANEOUS | Status: AC
  Administered 2015-05-15 – 2015-05-20 (×9): 1 via NASAL
  Filled 2015-05-15: qty 22

## 2015-05-15 NOTE — Care Management (Signed)
Patient was to have had a perm cath placed 10/11 but was deferred due to a run of V tach.  Anticipate patient to have this procedure 10/13.  Updated dialysis coordinator.  It verbally reported that patient was initially to have home peritoneal dialysis but now that is hospitalized and plan for skilled nursing, the training for the peritoneal dialysis can not be performed.  So- for now- will receive hemodialysis.

## 2015-05-15 NOTE — Consult Note (Signed)
Reason for Consult: ventricular tachycardia Referring Physician:  Dr. [name] is an 81 y.o. female.  HPI:  89 all anemia with history of headaches on chronic pain medication present with elevated blood pressure over 2 telemetry suggested episodes of possible tachycardia with wide complex patient had some mild shortness of breath blood pressure was treated in emergency room troponins are borderline CT for unremarkable but she was admitted for further evaluation because telemetry suggested possible ventricular tachycardia. No evidence of chest pain angina  Past Medical History  Diagnosis Date  . Muscle weakness   . Chronic respiratory failure ([place])   . Pneumonia   . Sleep apnea   . CHF (congestive heart failure) ([place])   . CKD (chronic kidney disease), stage IV ([place])   . Dysphagia   . GERD (gastroesophageal reflux disease)   . Back pain   . Epilepsy ([place])   . Hypertension   . Stroke ([place])   . Headache   . Arthritis   . Anemia     iron deficiency anemia  . Complication of anesthesia     difficult waking up after surgery (2013)    Past Surgical History  Procedure Laterality Date  . Back surgery    . Knee surgery    . Carotid endarterectomy    . Tonsillectomy    . Appendectomy    . Joint replacement Bilateral     Knee Replacements  . Dilation and curettage of uterus    . Breast surgery Right     Breast Biopsy  . Capd insertion N/A 04/12/2015    Procedure: LAPAROSCOPIC INSERTION CONTINUOUS AMBULATORY PERITONEAL DIALYSIS  (CAPD) CATHETER;  Surgeon: Katha Cabal, MD;  Location: [facility] ORS;  Service: Vascular;  Laterality: N/A;    Family History  Problem Relation Age of Onset  . CAD Mother   . CAD Father   . Anemia Father   . CVA Sister   . Multiple myeloma Brother     Social History:  reports that she quit smoking about 47 years ago. Her smoking use included Cigarettes. She has a 40 pack-year smoking history. She has never used smokeless tobacco. She  reports that she does not drink alcohol or use illicit drugs.  Allergies:  Allergies  Allergen Reactions  . Enalapril Anaphylaxis  . Tramadol Shortness Of Breath  . Capsaicin Other (See Comments)    Reaction:  Unknown   . Codeine Other (See Comments)    Reaction:  Unknown   . Hydrocodone Other (See Comments)    Reaction:  Unknown   . Vicodin [Hydrocodone-Acetaminophen] Other (See Comments)    Reaction:  Unknown     Medications: I have reviewed the patient's current medications.  Results for orders placed or performed during the hospital encounter of 05/07/15 (from the past 48 hour(s))  Glucose, capillary     Status: None   Collection Time: 05/14/15  7:35 AM  Result Value Ref Range   Glucose-Capillary 98 65 - 99 mg/dL    No results found.  Review of Systems  Constitutional: Positive for malaise/fatigue.  HENT: Positive for congestion.   Eyes: Negative.   Respiratory: Negative.   Cardiovascular: Positive for palpitations.  Gastrointestinal: Negative.   Genitourinary: Negative.   Musculoskeletal: Negative.   Skin: Negative.   Neurological: Positive for weakness and headaches.  Endo/Heme/Allergies: Negative.   Psychiatric/Behavioral: Negative.    Blood pressure 186/60, pulse 79, temperature 97.3 F (36.3 C), temperature source Oral, resp. rate 18, height 5' 4" (1.626  m), weight 91.808 kg (202 lb 6.4 oz), SpO2 94 %. Physical Exam  Nursing note and vitals reviewed. Constitutional: She is oriented to person, place, and time. She appears well-developed and well-nourished.  HENT:  Head: Normocephalic and atraumatic.  Eyes: Conjunctivae and EOM are normal. Pupils are equal, round, and reactive to light.  Neck: Normal range of motion. Neck supple.  Cardiovascular: Normal rate.   Murmur heard. Respiratory: Effort normal and breath sounds normal.  GI: Soft. Bowel sounds are normal.  Musculoskeletal: Normal range of motion.  Neurological: She is alert and oriented to person,  place, and time.  Skin: Skin is warm and dry.  Psychiatric: She has a normal mood and affect. Judgment normal.    Assessment/Plan:  possible ventricular tachycardia  possible artifact  chronic renal insufficiency stage IV  malignant hypertension  elevated troponin  GERD  history of seizures  congestive heart failure  GERD  anemia  weakness  demand ischemia with elevated troponin  . PLAN  would continue telemetry therapy  recommend beta-blockade therapy  echocardiogram may be helpful  consider amiodarone  continue Dilantin therapy for seizures  maintain Protonix therapy for reflux as well as Zantac  aggressive hypertension control Cardizem hydralazine clonidine  continue Lasix therapy for fluid  recommend conservative therapy at this stage  I do not recommend EP study or ablation  recommend  Correct electrolytes as well as oxygenation  CALLWOOD,DWAYNE D. 05/15/2015, 5:52 PM      

## 2015-05-15 NOTE — Progress Notes (Signed)
Patient has been resting quietly most of the day. No complaints of pain at this time. Will continue to monitor. Did not want to eat lunch, tray left for awhile incase she changed her mind.

## 2015-05-15 NOTE — Progress Notes (Signed)
Patient for Permcath insertion with Dr. Wyn Quakerew on Thursday 10/13

## 2015-05-15 NOTE — Progress Notes (Signed)
PT Cancellation Note  Patient Details Name: Kathie RhodesJacquelyn R Fedie MRN: [id number] DOB: [birth year]   Cancelled Treatment:    Reason Eval/Treat Not Completed: Medical issues which prohibited therapy (Per chart review, patient with new onset ventricular arrhythmia; pending cardiology consult.  Will hold activity until consult complete and patient cleared for additional activity.)   Hendy Brindle H. Manson PasseyBrown, PT, DPT, NCS 05/15/2015, 2:22 PM [phone]

## 2015-05-15 NOTE — Progress Notes (Addendum)
Patient ID: Kristina RhodesJacquelyn R Lavalle, female   DOB: [birth year], 81 y.o.   MRN: [id number] [facility] PROGRESS NOTE  Kristina Wilson QMV:[id number] DOB: [birth year] DOA: 05/07/2015 PCP: Jaclyn ShaggyATE,DENNY C, MD  HPI/Subjective:  back pain.  Objective: Filed Vitals:   05/15/15 1047  BP: 146/46  Pulse: 89  Temp: 97.3 F (36.3 C)  Resp:     Filed Weights   05/14/15 1342 05/14/15 1700 05/15/15 0507  Weight: 92.5 kg (203 lb 14.8 oz) 91.8 kg (202 lb 6.1 oz) 91.808 kg (202 lb 6.4 oz)    ROS: Review of Systems  Constitutional: Negative for fever and chills.  Eyes: Negative for blurred vision.  Respiratory: Negative for cough and shortness of breath.   Cardiovascular: Negative for chest pain.  Gastrointestinal: Negative for nausea, vomiting, abdominal pain, diarrhea and constipation.  Genitourinary: Negative for dysuria.  Musculoskeletal: Positive for back pain and joint pain.  Neurological: Negative for dizziness and headaches.   Exam: Physical Exam  Constitutional: She is oriented to person, place, and time.  HENT:  Nose: No mucosal edema.  Mouth/Throat: No oropharyngeal exudate or posterior oropharyngeal edema.  Eyes: Conjunctivae, EOM and lids are normal. Pupils are equal, round, and reactive to light.  Neck: No JVD present. Carotid bruit is not present. No edema present. No thyroid mass and no thyromegaly present.  Cardiovascular: S1 normal and S2 normal.  Exam reveals no gallop.   No murmur heard. Pulses:      Dorsalis pedis pulses are 2+ on the right side, and 2+ on the left side.  Respiratory: No respiratory distress. She has no wheezes. She has no rhonchi. She has no rales.  GI: Soft. Bowel sounds are normal. There is no tenderness.  Musculoskeletal: She exhibits no tenderness.       Right ankle: She exhibits no swelling.       Left ankle: She exhibits no swelling.  Pain in the right sacroiliac area and over lumbar spine to palpation.  Lymphadenopathy:    She has  no cervical adenopathy.  Neurological: She is alert and oriented to person, place, and time. No cranial nerve deficit.  Patient barely able to straight leg raise.  Skin: Skin is warm. Nails show no clubbing.  Chronic lower extremity discoloration bilaterally  Psychiatric: She has a normal mood and affect.     Data Reviewed: Basic Metabolic Panel:  Recent Labs Lab 05/10/15 0426 05/11/15 0903 05/12/15 0934 05/13/15 0547 05/13/15 0944  NA 135 135 133* 136 134*  K 3.8 3.7 3.8 3.8 3.9  CL 98* 96* 95* 96* 95*  CO2 29 30 32 31 31  GLUCOSE 108* 94 122* 99 127*  BUN 40* 41* 32* 21* 23*  CREATININE 2.86* 2.89* 2.32* 1.79* 1.82*  CALCIUM 9.2 9.5 9.3 9.0 9.3  MG  --   --   --  1.8  --   PHOS  --  5.4* 3.7  --  3.1   CBC:  Recent Labs Lab 05/10/15 0426 05/11/15 0533 05/12/15 0934 05/13/15 0944  WBC 7.5 6.2 11.4* 12.9*  HGB 7.8* 7.5* 8.7* 8.3*  HCT 23.0* 22.4* 26.2* 24.9*  MCV 98.2 96.9 97.9 97.9  PLT 234 245 277 260   Cardiac Enzymes:  Recent Labs Lab 05/08/15 1716  TROPONINI 0.08*   BNP (last 3 results)  Recent Labs  02/02/15 2121 05/07/15 2256  BNP 228.0* 805.0*      Recent Results (from the past 240 hour(s))  MRSA PCR Screening     Status:  Abnormal   Collection Time: 05/08/15  7:50 AM  Result Value Ref Range Status   MRSA by PCR POSITIVE (A) NEGATIVE Final    Comment:        The GeneXpert MRSA Assay (FDA approved for NASAL specimens only), is one component of a comprehensive MRSA colonization surveillance program. It is not intended to diagnose MRSA infection nor to guide or monitor treatment for MRSA infections. CRITICAL RESULT CALLED TO, READ BACK BY AND VERIFIED WITH: JESSICA CHRISTMAS ON 05/08/15 AT 1022 BY QSD      Studies: No results found.  Scheduled Meds: . amLODipine  10 mg Oral Daily  . aspirin EC  81 mg Oral Daily  . bimatoprost  1 drop Both Eyes BID  . carvedilol  25 mg Oral BID  .  ceFAZolin (ANCEF) IV  1 g Intravenous On  Call  . cloNIDine  0.3 mg Oral TID  . docusate sodium  100 mg Oral BID  . epoetin (EPOGEN/PROCRIT) injection  10,000 Units Intravenous Once  . feeding supplement (ENSURE ENLIVE)  237 mL Oral TID WC  . fentaNYL  25 mcg Transdermal Q72H  . fluticasone  2 spray Each Nare Daily  . furosemide  40 mg Oral Daily  . gentamicin cream  1 application Topical Daily  . heparin subcutaneous  5,000 Units Subcutaneous 3 times per day  . hydrALAZINE  100 mg Oral QID  . iron polysaccharides  150 mg Oral Daily  . lidocaine  1 patch Transdermal Q24H  . nitroGLYCERIN  0.5 inch Topical 4 times per day  . pantoprazole  40 mg Oral BID  . phenytoin  300 mg Oral QHS  . predniSONE  10 mg Oral Q breakfast  . sodium chloride  3 mL Intravenous Q12H    Assessment/Plan:  1.   Accelerated hypertension- blood pressure is labile.  Continue IV hydralazine and labetalol when necessary. Continue Norvasc, hydralazine, Lasix, clonidine and Coreg 12.5 mg twice a day. 2.   Back pain- completed doses of IV Solu-Medrol. Trial of oxycodone. Increased fentanyl patch. Patient wanted to hold off on imaging studies at this point. 3.   Chest pain and borderline troponin- labeled as demand ischemia by cardiology. Conservative management. Continue aspirin.  * Ventricular arrhythmia. Waiting for cardiology consult.  4.   Chronic pain- increased fentanyl patch. 5.   Gastroesophageal reflux disease without esophagitis continue Protonix 6.   Chronic kidney disease stage V, progression to ESRD. started hemodialysis on 10/8. Per Dr. Cherylann Ratel, can't yet transition to PD as shes transitioning care to a nursing home. Dr. Gilda Crease will put perma cath for permanent HD.   7.   History of seizure disorder on phenytoin 8.   Weakness- per physical therapy evaluation, the patient needs skilled nursing facility placement.  9. Chronic diastolic CHF (congestive heart failure) ejection fraction 55-60%. Stable.  I discussed with Dr. Cherylann Ratel.  Code  Status:     Code Status Orders        Start     Ordered   05/08/15 0514  Full code   Continuous     05/08/15 0513     Family Communication: Spoke with daughter and granddaughter. Disposition Plan: Possible discharge to skilled nursing facility in 2-3 days.  Consultants:  Cardiology  Nephrology  Time spent: 35 minutes  Shaune Pollack  [facility]

## 2015-05-15 NOTE — Care Management Important Message (Signed)
Important Message  Patient Details  Name: Kathie RhodesJacquelyn R Bradby MRN: [id number] Date of Birth: June 09, 1923   Medicare Important Message Given:  Yes-second notification given    Eber HongGreene, Shaira Sova R, RN 05/15/2015, 5:41 PM

## 2015-05-15 NOTE — Progress Notes (Signed)
Patient remained NSR and hemodynamically stable through the night. Her BP meds were given 1.5 hours apart in conjunction with the daughter and grand daughter because there is a concern that she received most her BP day shift Scheduled BP and PRN BP meds late. She was on continuous 2L of oxygen. 1 time PRN pain med was admisnistered for c/o hip pain. Patient's grand daughter was at the bedside overnight. Patient rested well for most of the night

## 2015-05-15 NOTE — Progress Notes (Signed)
[facility]  ROUNDING NOTE   Subjective:  Pt had HD today. States shes not feeling significantly better as compared to initiation of HD.  Had arrhythmia yesterday therefore PermCath placement was canceled  Objective:  Vital signs in last 24 hours:  Temp:  [97.3 F (36.3 C)-98.7 F (37.1 C)] 97.3 F (36.3 C) (10/12 1047) Pulse Rate:  [89-97] 89 (10/12 1047) Resp:  [18-20] 18 (10/12 0507) BP: (146-212)/(43-86) 146/46 mmHg (10/12 1047) SpO2:  [90 %-100 %] 94 % (10/12 1047) Weight:  [91.8 kg (202 lb 6.1 oz)-91.808 kg (202 lb 6.4 oz)] 91.808 kg (202 lb 6.4 oz) (10/12 0507)  Weight change: -0.121 kg (-4.3 oz) Filed Weights   05/14/15 1342 05/14/15 1700 05/15/15 0507  Weight: 92.5 kg (203 lb 14.8 oz) 91.8 kg (202 lb 6.1 oz) 91.808 kg (202 lb 6.4 oz)    Intake/Output: I/O last 3 completed shifts: In: 120 [P.O.:120] Out: 1000 [Other:1000]   Intake/Output this shift:  Total I/O In: 3 [I.V.:3] Out: -   Physical Exam: General: NAD  Head: Normocephalic, atraumatic. Moist oral mucosal membranes  Eyes: Anicteric  Neck: Supple, trachea midline  Lungs:  Clear to auscultation, normal effort  Heart: Irregular, no rubs  Abdomen:  Soft, nontender, +peritoneal dialysis catheter  Extremities: trace peripheral edema.  Neurologic: Nonfocal, moving all four extremities  Skin: No lesions  Access: PD catheter, right femoral temp dialysis 10/7 Kristina Wilson    Basic Metabolic Panel:  Recent Labs Lab 05/10/15 0426 05/11/15 0903 05/12/15 0934 05/13/15 0547 05/13/15 0944  NA 135 135 133* 136 134*  K 3.8 3.7 3.8 3.8 3.9  CL 98* 96* 95* 96* 95*  CO2 29 30 32 31 31  GLUCOSE 108* 94 122* 99 127*  BUN 40* 41* 32* 21* 23*  CREATININE 2.86* 2.89* 2.32* 1.79* 1.82*  CALCIUM 9.2 9.5 9.3 9.0 9.3  MG  --   --   --  1.8  --   PHOS  --  5.4* 3.7  --  3.1    Liver Function Tests:  Recent Labs Lab 05/11/15 0903 05/12/15 0934 05/13/15 0944  ALBUMIN 2.8* 2.8* 2.8*   No results  for input(s): LIPASE, AMYLASE in the last 168 hours. No results for input(s): AMMONIA in the last 168 hours.  CBC:  Recent Labs Lab 05/10/15 0426 05/11/15 0533 05/12/15 0934 05/13/15 0944  WBC 7.5 6.2 11.4* 12.9*  HGB 7.8* 7.5* 8.7* 8.3*  HCT 23.0* 22.4* 26.2* 24.9*  MCV 98.2 96.9 97.9 97.9  PLT 234 245 277 260    Cardiac Enzymes:  Recent Labs Lab 05/08/15 1716  TROPONINI 0.08*    BNP: Invalid input(s): POCBNP  CBG:  Recent Labs Lab 05/14/15 0735  GLUCAP 98    Microbiology: Results for orders placed or performed during the hospital encounter of 05/07/15  MRSA PCR Screening     Status: Abnormal   Collection Time: 05/08/15  7:50 AM  Result Value Ref Range Status   MRSA by PCR POSITIVE (A) NEGATIVE Final    Comment:        The GeneXpert MRSA Assay (FDA approved for NASAL specimens only), is one component of a comprehensive MRSA colonization surveillance program. It is not intended to diagnose MRSA infection nor to guide or monitor treatment for MRSA infections. CRITICAL RESULT CALLED TO, READ BACK BY AND VERIFIED WITH: Kristina Wilson ON 05/08/15 AT 1022 BY QSD     Coagulation Studies: No results for input(s): LABPROT, INR in the last 72 hours.  Urinalysis: No results for input(s): COLORURINE, LABSPEC, PHURINE, GLUCOSEU, HGBUR, BILIRUBINUR, KETONESUR, PROTEINUR, UROBILINOGEN, NITRITE, LEUKOCYTESUR in the last 72 hours.  Invalid input(s): APPERANCEUR    Imaging: No results found.   Medications:     . amLODipine  10 mg Oral Daily  . aspirin EC  81 mg Oral Daily  . bimatoprost  1 drop Both Eyes BID  . carvedilol  25 mg Oral BID  .  ceFAZolin (ANCEF) IV  1 g Intravenous On Call  . cloNIDine  0.3 mg Oral TID  . docusate sodium  100 mg Oral BID  . epoetin (EPOGEN/PROCRIT) injection  10,000 Units Intravenous Once  . feeding supplement (ENSURE ENLIVE)  237 mL Oral TID WC  . fentaNYL  25 mcg Transdermal Q72H  . fluticasone  2 spray Each Nare  Daily  . furosemide  40 mg Oral Daily  . gentamicin cream  1 application Topical Daily  . heparin subcutaneous  5,000 Units Subcutaneous 3 times per day  . hydrALAZINE  100 mg Oral QID  . iron polysaccharides  150 mg Oral Daily  . lidocaine  1 patch Transdermal Q24H  . nitroGLYCERIN  0.5 inch Topical 4 times per day  . pantoprazole  40 mg Oral BID  . phenytoin  300 mg Oral QHS  . predniSONE  10 mg Oral Q breakfast  . sodium chloride  3 mL Intravenous Q12H   acetaminophen, diphenhydrAMINE, hydrALAZINE, labetalol, metoCLOPramide (REGLAN) injection, nitroGLYCERIN, ondansetron (ZOFRAN) IV, oxyCODONE  Assessment/ Plan:  [name] with past medical history of hypertension, obstructive sleep apnea, DJD, CVA with residual left sided weakness, carotid stenosis status post L CEA 2/15, seizure disorder, diastolic congestive heart failure, anemia  1. End Stage Renal Disease with proteinuria: progression of diease from Chronic kidney disease stage V. 24 hour creatinine clearance 12. Initiated on hemodialysis on 10/8 through right femoral temp HD catheter.  -patient completed dialysis today. Still needs a PermCath but defer timing of this to vascular surgery.  2. Hypertension. Has history of labile blood pressure. BP 146/46, amlodipine, coreg, clonidine, hydralazine.  3. Secondary hyperparathyroidism. Last phos down to 3.1, no indication for binders.  4. Anemia chronic kidney disease: hgb low at 8.3, continue on epogen 10000 units IV with HD.    LOS: 6 Kristina Wilson 10/12/20162:50 PM

## 2015-05-15 NOTE — Progress Notes (Signed)
Dr. Imogene Burnhen aware of BP 146/46 - per MD, ok to give scheduled dose of nitro paste.

## 2015-05-16 ENCOUNTER — Encounter
Admission: EM | Disposition: A | Discharge status: Skilled Nursing Facility | Payer: Self-pay | Source: Home / Self Care | Attending: Internal Medicine

## 2015-05-16 HISTORY — PX: PERIPHERAL VASCULAR CATHETERIZATION: SHX172C

## 2015-05-16 LAB — BASIC METABOLIC PANEL
Anion gap: 6 (ref 5–15)
BUN: 20 mg/dL (ref 6–20)
CHLORIDE: 92 mmol/L — AB (ref 101–111)
CO2: 35 mmol/L — ABNORMAL HIGH (ref 22–32)
Calcium: 9 mg/dL (ref 8.9–10.3)
Creatinine, Ser: 1.68 mg/dL — ABNORMAL HIGH (ref 0.44–1.00)
GFR calc Af Amer: 30 mL/min — ABNORMAL LOW (ref >60)
GFR calc non Af Amer: 26 mL/min — ABNORMAL LOW (ref >60)
GLUCOSE: 94 mg/dL (ref 65–99)
Potassium: 3.6 mmol/L (ref 3.5–5.1)
SODIUM: 133 mmol/L — AB (ref 135–145)

## 2015-05-16 LAB — CBC WITH DIFFERENTIAL/PLATELET
Basophils Absolute: 0 10*3/uL (ref 0–0.1)
Basophils Relative: 0 %
EOS ABS: 0.2 10*3/uL (ref 0–0.7)
EOS PCT: 3 %
HCT: 21.6 % — ABNORMAL LOW (ref 35.0–47.0)
HEMOGLOBIN: 7.2 g/dL — AB (ref 12.0–16.0)
LYMPHS ABS: 1.3 10*3/uL (ref 1.0–3.6)
Lymphocytes Relative: 15 %
MCH: 32.7 pg (ref 26.0–34.0)
MCHC: 33.3 g/dL (ref 32.0–36.0)
MCV: 98.4 fL (ref 80.0–100.0)
MONO ABS: 1.1 10*3/uL — AB (ref 0.2–0.9)
MONOS PCT: 13 %
NEUTROS PCT: 69 %
Neutro Abs: 6 10*3/uL (ref 1.4–6.5)
Platelets: 268 10*3/uL (ref 150–440)
RBC: 2.19 MIL/uL — ABNORMAL LOW (ref 3.80–5.20)
RDW: 12.8 % (ref 11.5–14.5)
WBC: 8.7 10*3/uL (ref 3.6–11.0)

## 2015-05-16 SURGERY — DIALYSIS/PERMA CATHETER INSERTION
Anesthesia: Moderate Sedation

## 2015-05-16 MED ORDER — HEPARIN SODIUM (PORCINE) 10000 UNIT/ML IJ SOLN
INTRAMUSCULAR | Status: AC
  Filled 2015-05-16: qty 1

## 2015-05-16 MED ORDER — HEPARIN (PORCINE) IN NACL 2-0.9 UNIT/ML-% IJ SOLN
INTRAMUSCULAR | Status: AC
  Filled 2015-05-16: qty 500

## 2015-05-16 MED ORDER — SODIUM CHLORIDE 0.9 % IV SOLN
INTRAVENOUS | Status: DC
  Administered 2015-05-16: 13:00:00 via INTRAVENOUS

## 2015-05-16 MED ORDER — CEFAZOLIN SODIUM 1-5 GM-% IV SOLN
INTRAVENOUS | Status: AC
  Filled 2015-05-16: qty 50

## 2015-05-16 MED ORDER — LIDOCAINE-EPINEPHRINE (PF) 1 %-1:200000 IJ SOLN
INTRAMUSCULAR | Status: AC
  Filled 2015-05-16: qty 30

## 2015-05-16 MED ORDER — MIDAZOLAM HCL 2 MG/2ML IJ SOLN
INTRAMUSCULAR | Status: DC | PRN
  Administered 2015-05-16: 2 mg via INTRAVENOUS

## 2015-05-16 MED ORDER — MIDAZOLAM HCL 5 MG/5ML IJ SOLN
INTRAMUSCULAR | Status: AC
  Filled 2015-05-16: qty 5

## 2015-05-16 MED ORDER — CHLORHEXIDINE GLUCONATE CLOTH 2 % EX PADS
6.0000 | MEDICATED_PAD | Freq: Once | CUTANEOUS | Status: AC
  Administered 2015-05-16: 6 via TOPICAL

## 2015-05-16 MED ORDER — HYDRALAZINE HCL 20 MG/ML IJ SOLN
INTRAMUSCULAR | Status: AC
  Filled 2015-05-16: qty 1

## 2015-05-16 MED ORDER — FENTANYL CITRATE (PF) 100 MCG/2ML IJ SOLN
INTRAMUSCULAR | Status: DC | PRN
  Administered 2015-05-16: 100 ug via INTRAVENOUS

## 2015-05-16 MED ORDER — HEPARIN SODIUM (PORCINE) 1000 UNIT/ML IJ SOLN
INTRAMUSCULAR | Status: AC
  Filled 2015-05-16: qty 1

## 2015-05-16 MED ORDER — FENTANYL CITRATE (PF) 100 MCG/2ML IJ SOLN
INTRAMUSCULAR | Status: AC
  Filled 2015-05-16: qty 2

## 2015-05-16 SURGICAL SUPPLY — 8 items
ADH SKN CLS APL DERMABOND .7 (GAUZE/BANDAGES/DRESSINGS) ×1
CATH PALINDROME RT-P 15FX19CM (CATHETERS) ×2 IMPLANT
DERMABOND ADVANCED (GAUZE/BANDAGES/DRESSINGS) ×2
DERMABOND ADVANCED .7 DNX12 (GAUZE/BANDAGES/DRESSINGS) IMPLANT
PACK ANGIOGRAPHY (CUSTOM PROCEDURE TRAY) ×2 IMPLANT
SUT MNCRL AB 4-0 PS2 18 (SUTURE) ×2 IMPLANT
SUT PROLENE 0 CT 1 30 (SUTURE) ×2 IMPLANT
TOWEL OR 17X26 4PK STRL BLUE (TOWEL DISPOSABLE) ×2 IMPLANT

## 2015-05-16 NOTE — Progress Notes (Signed)
HD tx start 

## 2015-05-16 NOTE — Progress Notes (Signed)
Patient ID: Kristina Wilson, female   DOB: 1923/03/31, 81 y.o.   MRN: [id number] [facility] PROGRESS NOTE  CATY TESSLER [id number]:[id number] DOB: [birth year] DOA: 05/07/2015 PCP: Jaclyn Shaggy, MD  HPI/Subjective: S/p perma cath just now.  Objective: Filed Vitals:   05/16/15 1536  BP: 207/81  Pulse: 83  Temp:   Resp: 13    Filed Weights   05/14/15 1700 05/15/15 0507 05/16/15 0700  Weight: 91.8 kg (202 lb 6.1 oz) 91.808 kg (202 lb 6.4 oz) 94.076 kg (207 lb 6.4 oz)    ROS: Review of Systems  Constitutional: Negative for fever and chills.  Eyes: Negative for blurred vision.  Respiratory: Negative for cough and shortness of breath.   Cardiovascular: Negative for chest pain.  Gastrointestinal: Negative for nausea, vomiting, abdominal pain, diarrhea and constipation.  Genitourinary: Negative for dysuria.  Musculoskeletal: Positive for back pain and joint pain.  Neurological: Negative for dizziness and headaches.   Exam: Physical Exam  Constitutional: She is oriented to person, place, and time.  HENT:  Nose: No mucosal edema.  Mouth/Throat: No oropharyngeal exudate or posterior oropharyngeal edema.  Eyes: Conjunctivae, EOM and lids are normal. Pupils are equal, round, and reactive to light.  Neck: No JVD present. Carotid bruit is not present. No edema present. No thyroid mass and no thyromegaly present.  Cardiovascular: S1 normal and S2 normal.  Exam reveals no gallop.   No murmur heard. Pulses:      Dorsalis pedis pulses are 2+ on the right side, and 2+ on the left side.  Respiratory: No respiratory distress. She has no wheezes. She has no rhonchi. She has no rales.  GI: Soft. Bowel sounds are normal. There is no tenderness.  Musculoskeletal: She exhibits no tenderness.       Right ankle: She exhibits no swelling.       Left ankle: She exhibits no swelling.  Pain in the right sacroiliac area and over lumbar spine to palpation.  Lymphadenopathy:    She has no  cervical adenopathy.  Neurological: She is alert and oriented to person, place, and time. No cranial nerve deficit.  Patient barely able to straight leg raise.  Skin: Skin is warm. Nails show no clubbing.  Chronic lower extremity discoloration bilaterally  Psychiatric: She has a normal mood and affect.     Data Reviewed: Basic Metabolic Panel:  Recent Labs Lab 05/11/15 0903 05/12/15 0934 05/13/15 0547 05/13/15 0944 05/16/15 0355  NA 135 133* 136 134* 133*  K 3.7 3.8 3.8 3.9 3.6  CL 96* 95* 96* 95* 92*  CO2 30 32 31 31 35*  GLUCOSE 94 122* 99 127* 94  BUN 41* 32* 21* 23* 20  CREATININE 2.89* 2.32* 1.79* 1.82* 1.68*  CALCIUM 9.5 9.3 9.0 9.3 9.0  MG  --   --  1.8  --   --   PHOS 5.4* 3.7  --  3.1  --    CBC:  Recent Labs Lab 05/10/15 0426 05/11/15 0533 05/12/15 0934 05/13/15 0944 05/16/15 0355  WBC 7.5 6.2 11.4* 12.9* 8.7  NEUTROABS  --   --   --   --  6.0  HGB 7.8* 7.5* 8.7* 8.3* 7.2*  HCT 23.0* 22.4* 26.2* 24.9* 21.6*  MCV 98.2 96.9 97.9 97.9 98.4  PLT 234 245 277 260 268   Cardiac Enzymes: No results for input(s): CKTOTAL, CKMB, CKMBINDEX, TROPONINI in the last 168 hours. BNP (last 3 results)  Recent Labs  02/02/15 2121 05/07/15 2256  BNP  228.0* 805.0*      Recent Results (from the past 240 hour(s))  MRSA PCR Screening     Status: Abnormal   Collection Time: 05/08/15  7:50 AM  Result Value Ref Range Status   MRSA by PCR POSITIVE (A) NEGATIVE Final    Comment:        The GeneXpert MRSA Assay (FDA approved for NASAL specimens only), is one component of a comprehensive MRSA colonization surveillance program. It is not intended to diagnose MRSA infection nor to guide or monitor treatment for MRSA infections. CRITICAL RESULT CALLED TO, READ BACK BY AND VERIFIED WITH: JESSICA CHRISTMAS ON 05/08/15 AT 1022 BY QSD      Studies: No results found.  Scheduled Meds: . [MAR Hold] amLODipine  10 mg Oral Daily  . [MAR Hold] aspirin EC  81 mg Oral  Daily  . [MAR Hold] bimatoprost  1 drop Both Eyes BID  . [MAR Hold] carvedilol  25 mg Oral BID  . [MAR Hold] Chlorhexidine Gluconate Cloth  6 each Topical Q0600  . [MAR Hold] cloNIDine  0.3 mg Oral TID  . [MAR Hold] docusate sodium  100 mg Oral BID  . [MAR Hold] epoetin (EPOGEN/PROCRIT) injection  10,000 Units Intravenous Once  . [MAR Hold] feeding supplement (ENSURE ENLIVE)  237 mL Oral TID WC  . [MAR Hold] fentaNYL  25 mcg Transdermal Q72H  . [MAR Hold] fluticasone  2 spray Each Nare Daily  . [MAR Hold] furosemide  40 mg Oral Daily  . [MAR Hold] gentamicin cream  1 application Topical Daily  . [MAR Hold] heparin subcutaneous  5,000 Units Subcutaneous 3 times per day  . hydrALAZINE      . [MAR Hold] hydrALAZINE  100 mg Oral QID  . [MAR Hold] iron polysaccharides  150 mg Oral Daily  . [MAR Hold] lidocaine  1 patch Transdermal Q24H  . [MAR Hold] mupirocin ointment  1 application Nasal BID  . [MAR Hold] nitroGLYCERIN  0.5 inch Topical 4 times per day  . [MAR Hold] pantoprazole  40 mg Oral BID  . [MAR Hold] phenytoin  300 mg Oral QHS  . [MAR Hold] predniSONE  10 mg Oral Q breakfast  . [MAR Hold] sodium chloride  3 mL Intravenous Q12H    Assessment/Plan:  1.   Accelerated hypertension- blood pressure is labile.  Continue IV hydralazine and labetalol when necessary. Continue Norvasc, hydralazine, Lasix, clonidine and Coreg 12.5 mg twice a day. 2.   Back pain- completed doses of IV Solu-Medrol. Trial of oxycodone. Increased fentanyl patch. Patient wanted to hold off on imaging studies at this point. 3.   Chest pain and borderline troponin- labeled as demand ischemia by cardiology. Conservative management. Continue aspirin.  * Ventricular arrhythmia. Continue current treatment per Dr. Juliann Pares. 4.   Chronic pain- continue fentanyl patch. 5.   Gastroesophageal reflux disease without esophagitis continue Protonix 6.   Chronic kidney disease stage V, progression to ESRD. started hemodialysis  on 10/8. Per Dr. Cherylann Ratel, can't yet transition to PD as shes transitioning care to a nursing home. Dr. Wyn Quaker put perma cath today for permanent HD.   7.   History of seizure disorder on phenytoin 8.   Weakness- per physical therapy evaluation, the patient needs skilled nursing facility placement.  9. Chronic diastolic CHF (congestive heart failure) ejection fraction 55-60%. Stable.  * Anemia of chronic disease. Hb down to 7.2, f/u CBC.  I discussed with Dr. Cherylann Ratel and Dr. Nydia Bouton.  Code Status:  Code Status Orders        Start     Ordered   05/08/15 0514  Full code   Continuous     05/08/15 0513     Family Communication: Spoke with daughter and granddaughter. Disposition Plan: Possible discharge to skilled nursing facility in 2 days.  Consultants:  Cardiology  Nephrology  Time spent: 37 minutes  Shaune Pollackhen, Presleigh Feldstein  [facility]

## 2015-05-16 NOTE — Progress Notes (Signed)
[facility]  ROUNDING NOTE   Subjective:  Pt had permcath placed today. Still not eating much per daughter.  Daughter feels that mentation is better however.  Objective:  Vital signs in last 24 hours:  Temp:  [98 F (36.7 C)-98.1 F (36.7 C)] 98 F (36.7 C) (10/13 1137) Pulse Rate:  [79-91] 83 (10/13 1536) Resp:  [13-20] 13 (10/13 1536) BP: (160-241)/(53-89) 207/81 mmHg (10/13 1536) SpO2:  [93 %-98 %] 98 % (10/13 1536) Weight:  [94.076 kg (207 lb 6.4 oz)] 94.076 kg (207 lb 6.4 oz) (10/13 0700)  Weight change: 0.076 kg (2.7 oz) Filed Weights   05/14/15 1700 05/15/15 0507 05/16/15 0700  Weight: 91.8 kg (202 lb 6.1 oz) 91.808 kg (202 lb 6.4 oz) 94.076 kg (207 lb 6.4 oz)    Intake/Output: I/O last 3 completed shifts: In: 3 [I.V.:3] Out: 200 [Urine:200]   Intake/Output this shift:  Total I/O In: -  Out: 200 [Urine:200]  Physical Exam: General: NAD  Head: Normocephalic, atraumatic. Moist oral mucosal membranes  Eyes: Anicteric  Neck: Supple, trachea midline  Lungs:  Clear to auscultation, normal effort  Heart: Irregular, no rubs  Abdomen:  Soft, nontender, +peritoneal dialysis catheter  Extremities: trace peripheral edema.  Neurologic: Nonfocal, moving all four extremities  Skin: No lesions  Access: PD catheter, R IJ Permcath placed 05/16/15.    Basic Metabolic Panel:  Recent Labs Lab 05/11/15 0903 05/12/15 0934 05/13/15 0547 05/13/15 0944 05/16/15 0355  NA 135 133* 136 134* 133*  K 3.7 3.8 3.8 3.9 3.6  CL 96* 95* 96* 95* 92*  CO2 30 32 31 31 35*  GLUCOSE 94 122* 99 127* 94  BUN 41* 32* 21* 23* 20  CREATININE 2.89* 2.32* 1.79* 1.82* 1.68*  CALCIUM 9.5 9.3 9.0 9.3 9.0  MG  --   --  1.8  --   --   PHOS 5.4* 3.7  --  3.1  --     Liver Function Tests:  Recent Labs Lab 05/11/15 0903 05/12/15 0934 05/13/15 0944  ALBUMIN 2.8* 2.8* 2.8*   No results for input(s): LIPASE, AMYLASE in the last 168 hours. No results for input(s): AMMONIA in  the last 168 hours.  CBC:  Recent Labs Lab 05/10/15 0426 05/11/15 0533 05/12/15 0934 05/13/15 0944 05/16/15 0355  WBC 7.5 6.2 11.4* 12.9* 8.7  NEUTROABS  --   --   --   --  6.0  HGB 7.8* 7.5* 8.7* 8.3* 7.2*  HCT 23.0* 22.4* 26.2* 24.9* 21.6*  MCV 98.2 96.9 97.9 97.9 98.4  PLT 234 245 277 260 268    Cardiac Enzymes: No results for input(s): CKTOTAL, CKMB, CKMBINDEX, TROPONINI in the last 168 hours.  BNP: Invalid input(s): POCBNP  CBG:  Recent Labs Lab 05/14/15 0735  GLUCAP 98    Microbiology: Results for orders placed or performed during the hospital encounter of 05/07/15  MRSA PCR Screening     Status: Abnormal   Collection Time: 05/08/15  7:50 AM  Result Value Ref Range Status   MRSA by PCR POSITIVE (A) NEGATIVE Final    Comment:        The GeneXpert MRSA Assay (FDA approved for NASAL specimens only), is one component of a comprehensive MRSA colonization surveillance program. It is not intended to diagnose MRSA infection nor to guide or monitor treatment for MRSA infections. CRITICAL RESULT CALLED TO, READ BACK BY AND VERIFIED WITH: JESSICA CHRISTMAS ON 05/08/15 AT 1022 BY QSD     Coagulation Studies: No  results for input(s): LABPROT, INR in the last 72 hours.  Urinalysis: No results for input(s): COLORURINE, LABSPEC, PHURINE, GLUCOSEU, HGBUR, BILIRUBINUR, KETONESUR, PROTEINUR, UROBILINOGEN, NITRITE, LEUKOCYTESUR in the last 72 hours.  Invalid input(s): APPERANCEUR    Imaging: No results found.   Medications:     . amLODipine  10 mg Oral Daily  . aspirin EC  81 mg Oral Daily  . bimatoprost  1 drop Both Eyes BID  . carvedilol  25 mg Oral BID  . Chlorhexidine Gluconate Cloth  6 each Topical Q0600  . cloNIDine  0.3 mg Oral TID  . docusate sodium  100 mg Oral BID  . epoetin (EPOGEN/PROCRIT) injection  10,000 Units Intravenous Once  . feeding supplement (ENSURE ENLIVE)  237 mL Oral TID WC  . fentaNYL  25 mcg Transdermal Q72H  . fluticasone  2  spray Each Nare Daily  . furosemide  40 mg Oral Daily  . gentamicin cream  1 application Topical Daily  . heparin subcutaneous  5,000 Units Subcutaneous 3 times per day  . hydrALAZINE      . hydrALAZINE  100 mg Oral QID  . iron polysaccharides  150 mg Oral Daily  . lidocaine  1 patch Transdermal Q24H  . mupirocin ointment  1 application Nasal BID  . nitroGLYCERIN  0.5 inch Topical 4 times per day  . pantoprazole  40 mg Oral BID  . phenytoin  300 mg Oral QHS  . predniSONE  10 mg Oral Q breakfast  . sodium chloride  3 mL Intravenous Q12H   acetaminophen, diphenhydrAMINE, hydrALAZINE, labetalol, metoCLOPramide (REGLAN) injection, nitroGLYCERIN, ondansetron (ZOFRAN) IV, oxyCODONE  Assessment/ Plan:  Ms. IMANI FIEBELKORN is a 81 y.o. white female with past medical history of hypertension, obstructive sleep apnea, DJD, CVA with residual left sided weakness, carotid stenosis status post L CEA 2/15, seizure disorder, diastolic congestive heart failure, anemia  1. End Stage Renal Disease with proteinuria: progression of diease from Chronic kidney disease stage V. 24 hour creatinine clearance 12. Initiated on hemodialysis on 10/8 through right femoral temp HD catheter.  -permcath placed today, appreciate vascular assistance, d/c temp HD catheter.  Pt due for HD today.  2. Hypertension. Has history of labile blood pressure. BP very labile, currently 207/81, but has been as low as 146/46, continue amlodipine, coreg, clonidine, hydralazine.  3. Secondary hyperparathyroidism. Phos control improved with HD.  Continue to periodically monitor.  4. Anemia chronic kidney disease: hgb down to 7.3, continue epogen, but transfuse for hgb of 7 or less.    LOS: 7 Kristina Wilson 10/13/20165:22 PM

## 2015-05-16 NOTE — Progress Notes (Signed)
Post hd tx 

## 2015-05-16 NOTE — Progress Notes (Signed)
PT Cancellation Note  Patient Details Name: Kristina RhodesJacquelyn R Durell MRN: [id number] DOB: 13-Oct-1922   Cancelled Treatment:    Reason Eval/Treat Not Completed: Pain limiting ability to participate (Treatment session attempted.  Patient just finished ADL with CNA; unable to tolerate additional therapy/movement at this time.  Patient/daughter request therapist return next date with goal of attempting sitting.  Will continue efforts as appropriate.)   Beata Beason H. Manson PasseyBrown, PT, DPT, NCS 05/16/2015, 9:51 AM [phone]

## 2015-05-16 NOTE — Progress Notes (Signed)
Pre-hd tx 

## 2015-05-16 NOTE — Op Note (Signed)
OPERATIVE NOTE    PRE-OPERATIVE DIAGNOSIS: 1. ESRD  POST-OPERATIVE DIAGNOSIS: same as above  PROCEDURE: 1. Ultrasound guidance for vascular access to the right internal jugular vein 2. Fluoroscopic guidance for placement of catheter 3. Placement of a 19 cm tip to cuff tunneled hemodialysis catheter via the right internal jugular vein  SURGEON: Rilynn Habel, MD  Festus BarrenANESTHESIA:  Local/MCS  ESTIMATED BLOOD LOSS: 50 cc  FINDING(S): 1.  Patent right internal jugular vein  SPECIMEN(S):  None  INDICATIONS:   Kristina Wilson is a 81 y.o. female who presents with renal failure.  The patient needs long term dialysis access for their ESRD, and a Permcath is necessary.  Risks and benefits are discussed and informed consent is obtained.    DESCRIPTION: After obtaining full informed written consent, the patient was brought back to the vascular suited. The patient's right neck and chest were sterilely prepped and draped in a sterile surgical field was created.  The right internal jugular vein was visualized with ultrasound and found to be patent. It was then accessed under direct ultrasound guidance and a permanent image was recorded. A wire was placed. After skin nick and dilatation, the peel-away sheath was placed over the wire. I then turned my attention to an area under the clavicle. Approximately 1-2 fingerbreadths below the clavicle a small counterincision was created and tunneled from the subclavicular incision to the access site. Using fluoroscopic guidance, a 19 cm centimeter tip to cuff tunneled hemodialysis catheter was selected, and tunneled from the subclavicular incision to the access site. It was then placed through the peel-away sheath and the peel-away sheath was removed. Using fluoroscopic guidance the catheter tips were parked in the right atrium. The appropriate distal connectors were placed. It withdrew blood well and flushed easily with heparinized saline and a concentrated heparin  solution was then placed. It was secured to the chest wall with 2 Prolene sutures. The access incision was closed single 4-0 Monocryl. A 4-0 Monocryl pursestring suture was placed around the exit site. Sterile dressings were placed. The patient tolerated the procedure well and was taken to the recovery room in stable condition.  COMPLICATIONS: None  CONDITION: Stable  Kevan Prouty  05/16/2015, 2:54 PM

## 2015-05-16 NOTE — Progress Notes (Signed)
Pt returned from specials where she had rt shoulder permacath placed. Pt is sleepy but comfortable. She is to go to dialysis this afternoon.

## 2015-05-16 NOTE — Progress Notes (Signed)
HD tx completed.

## 2015-05-17 ENCOUNTER — Encounter: Payer: Self-pay | Admitting: Vascular Surgery

## 2015-05-17 LAB — GLUCOSE, CAPILLARY: Glucose-Capillary: 81 mg/dL (ref 65–99)

## 2015-05-17 MED ORDER — PROMETHAZINE HCL 25 MG/ML IJ SOLN
INTRAMUSCULAR | Status: AC
  Filled 2015-05-17: qty 1

## 2015-05-17 MED ORDER — EPOETIN ALFA 10000 UNIT/ML IJ SOLN
10000.0000 [IU] | INTRAMUSCULAR | Status: DC
  Administered 2015-05-18 – 2015-05-21 (×2): 10000 [IU] via INTRAVENOUS

## 2015-05-17 MED ORDER — PROMETHAZINE HCL 25 MG/ML IJ SOLN
12.5000 mg | Freq: Four times a day (QID) | INTRAMUSCULAR | Status: DC | PRN
  Administered 2015-05-17 – 2015-05-21 (×3): 12.5 mg via INTRAVENOUS
  Filled 2015-05-17 (×2): qty 1

## 2015-05-17 MED ORDER — LACTULOSE 10 GM/15ML PO SOLN
20.0000 g | Freq: Every day | ORAL | Status: DC | PRN
  Administered 2015-05-17: 20 g via ORAL
  Filled 2015-05-17: qty 30

## 2015-05-17 NOTE — Care Management (Addendum)
CSW informed that  CM that patient has not sat for HD session yet and must be able to demonstrate this before discharge.  Reached out to dialysis coordinator for actual schedule which was verbally relayed to this CM 10/13 but CM forgot to document.  [facility] [place] at 10:30.  Relayed to primary nurse during report of need to sit for HD from this point forward

## 2015-05-17 NOTE — Progress Notes (Signed)
[facility]  ROUNDING NOTE   Subjective:  Pt laying in bed. Had HD yesterday. Has permcath in place.  Objective:  Vital signs in last 24 hours:  Temp:  [98.2 F (36.8 C)-98.9 F (37.2 C)] 98.9 F (37.2 C) (10/14 0449) Pulse Rate:  [38-91] 82 (10/14 0449) Resp:  [13-25] 20 (10/14 0449) BP: (142-241)/(40-89) 212/67 mmHg (10/14 0734) SpO2:  [93 %-99 %] 98 % (10/14 0449) Weight:  [90.039 kg (198 lb 8 oz)-94.5 kg (208 lb 5.4 oz)] 90.039 kg (198 lb 8 oz) (10/14 0500)  Weight change: 0.424 kg (15 oz) Filed Weights   05/16/15 1730 05/16/15 2050 05/17/15 0500  Weight: 94.5 kg (208 lb 5.4 oz) 93.5 kg (206 lb 2.1 oz) 90.039 kg (198 lb 8 oz)    Intake/Output: I/O last 3 completed shifts: In: -  Out: 1400 [Urine:400; Other:1000]   Intake/Output this shift:     Physical Exam: General: NAD  Head: Normocephalic, atraumatic. Moist oral mucosal membranes  Eyes: Anicteric  Neck: Supple, trachea midline  Lungs:  Clear to auscultation, normal effort  Heart: Irregular, no rubs  Abdomen:  Soft, nontender, +peritoneal dialysis catheter  Extremities: trace peripheral edema.  Neurologic: Nonfocal, moving all four extremities  Skin: No lesions  Access: PD catheter, R IJ Permcath placed 05/16/15.    Basic Metabolic Panel:  Recent Labs Lab 05/11/15 0903 05/12/15 0934 05/13/15 0547 05/13/15 0944 05/16/15 0355  NA 135 133* 136 134* 133*  K 3.7 3.8 3.8 3.9 3.6  CL 96* 95* 96* 95* 92*  CO2 30 32 31 31 35*  GLUCOSE 94 122* 99 127* 94  BUN 41* 32* 21* 23* 20  CREATININE 2.89* 2.32* 1.79* 1.82* 1.68*  CALCIUM 9.5 9.3 9.0 9.3 9.0  MG  --   --  1.8  --   --   PHOS 5.4* 3.7  --  3.1  --     Liver Function Tests:  Recent Labs Lab 05/11/15 0903 05/12/15 0934 05/13/15 0944  ALBUMIN 2.8* 2.8* 2.8*   No results for input(s): LIPASE, AMYLASE in the last 168 hours. No results for input(s): AMMONIA in the last 168 hours.  CBC:  Recent Labs Lab 05/11/15 0533  05/12/15 0934 05/13/15 0944 05/16/15 0355  WBC 6.2 11.4* 12.9* 8.7  NEUTROABS  --   --   --  6.0  HGB 7.5* 8.7* 8.3* 7.2*  HCT 22.4* 26.2* 24.9* 21.6*  MCV 96.9 97.9 97.9 98.4  PLT 245 277 260 268    Cardiac Enzymes: No results for input(s): CKTOTAL, CKMB, CKMBINDEX, TROPONINI in the last 168 hours.  BNP: Invalid input(s): POCBNP  CBG:  Recent Labs Lab 05/14/15 0735 05/17/15 0739  GLUCAP 98 81    Microbiology: Results for orders placed or performed during the hospital encounter of 05/07/15  MRSA PCR Screening     Status: Abnormal   Collection Time: 05/08/15  7:50 AM  Result Value Ref Range Status   MRSA by PCR POSITIVE (A) NEGATIVE Final    Comment:        The GeneXpert MRSA Assay (FDA approved for NASAL specimens only), is one component of a comprehensive MRSA colonization surveillance program. It is not intended to diagnose MRSA infection nor to guide or monitor treatment for MRSA infections. CRITICAL RESULT CALLED TO, READ BACK BY AND VERIFIED WITH: JESSICA CHRISTMAS ON 05/08/15 AT 1022 BY QSD     Coagulation Studies: No results for input(s): LABPROT, INR in the last 72 hours.  Urinalysis: No results for  input(s): COLORURINE, LABSPEC, PHURINE, GLUCOSEU, HGBUR, BILIRUBINUR, KETONESUR, PROTEINUR, UROBILINOGEN, NITRITE, LEUKOCYTESUR in the last 72 hours.  Invalid input(s): APPERANCEUR    Imaging: No results found.   Medications:     . amLODipine  10 mg Oral Daily  . aspirin EC  81 mg Oral Daily  . bimatoprost  1 drop Both Eyes BID  . carvedilol  25 mg Oral BID  . Chlorhexidine Gluconate Cloth  6 each Topical Q0600  . cloNIDine  0.3 mg Oral TID  . docusate sodium  100 mg Oral BID  . epoetin (EPOGEN/PROCRIT) injection  10,000 Units Intravenous Once  . feeding supplement (ENSURE ENLIVE)  237 mL Oral TID WC  . fentaNYL  25 mcg Transdermal Q72H  . fluticasone  2 spray Each Nare Daily  . furosemide  40 mg Oral Daily  . gentamicin cream  1  application Topical Daily  . heparin subcutaneous  5,000 Units Subcutaneous 3 times per day  . hydrALAZINE  100 mg Oral QID  . iron polysaccharides  150 mg Oral Daily  . lidocaine  1 patch Transdermal Q24H  . mupirocin ointment  1 application Nasal BID  . nitroGLYCERIN  0.5 inch Topical 4 times per day  . pantoprazole  40 mg Oral BID  . phenytoin  300 mg Oral QHS  . predniSONE  10 mg Oral Q breakfast  . promethazine      . sodium chloride  3 mL Intravenous Q12H   acetaminophen, diphenhydrAMINE, hydrALAZINE, labetalol, nitroGLYCERIN, ondansetron (ZOFRAN) IV, oxyCODONE, promethazine  Assessment/ Plan:  Ms. COLANDRA OHANIAN is a 81 y.o. white female with past medical history of hypertension, obstructive sleep apnea, DJD, CVA with residual left sided weakness, carotid stenosis status post L CEA 2/15, seizure disorder, diastolic congestive heart failure, anemia  1. End Stage Renal Disease with proteinuria: progression of diease from Chronic kidney disease stage V. 24 hour creatinine clearance 12. Initiated on hemodialysis on 10/8 through right femoral temp HD catheter.  -d/c temporary HD catheter today, no indication for HD today, will plan for HD again tomorrow.    2. Hypertension. Has history of labile blood pressure. Very difficult time treating hypertension and avoiding side effects.  Blood pressures very labile.  Most recent BP was 212/67, but pressure has been as low as 142/40.  May need to accept highs to avoid lows.  For now continue hydralazine, amlodipine, coreg, clonidine. Could consider adding an ARB but hold off at this time.  3. Secondary hyperparathyroidism. Continue to intermittently monitor phos.  4. Anemia chronic kidney disease: hgb 7.2 at most recent check, will continue epogen.   LOS: 8 Kristina Wilson 10/14/201611:46 AM

## 2015-05-17 NOTE — Progress Notes (Signed)
Notified Dr. Imogene Burnhen in person that patient hasnt had bm in over a week, per MD enter lactulose.

## 2015-05-17 NOTE — Care Management Important Message (Signed)
Important Message  Patient Details  Name: Kristina Wilson MRN: [id number] Date of Birth: 29-Aug-1922   Medicare Important Message Given:  Yes-second notification given    Eber HongGreene, Britta Louth R, RN 05/17/2015, 6:27 PM

## 2015-05-17 NOTE — Progress Notes (Signed)
Notified Dr. Imogene Burnhen in person that patients granduaghter wants pt to have phenergan, per granddaughter zofran does not work and she can not take reglan due to interactions

## 2015-05-17 NOTE — Progress Notes (Signed)
Patient ID: Kristina Wilson, female   DOB: 07/30/1923, 81 y.o.   MRN: [id number] [facility] PROGRESS NOTE  Kristina Wilson WUJ:[id number] DOB: 18-Mar-1923 DOA: 05/07/2015 PCP: Jaclyn Shaggy, MD  HPI/Subjective: Back pain and nausea.  Objective: Filed Vitals:   05/17/15 1227  BP: 157/48  Pulse: 81  Temp: 98.9 F (37.2 C)  Resp: 21    Filed Weights   05/16/15 1730 05/16/15 2050 05/17/15 0500  Weight: 94.5 kg (208 lb 5.4 oz) 93.5 kg (206 lb 2.1 oz) 90.039 kg (198 lb 8 oz)    ROS: Review of Systems  Constitutional: Negative for fever and chills.  Eyes: Negative for blurred vision.  Respiratory: Negative for cough and shortness of breath.   Cardiovascular: Negative for chest pain.  Gastrointestinal: Negative for nausea, vomiting, abdominal pain, diarrhea and constipation.  Genitourinary: Negative for dysuria.  Musculoskeletal: Positive for back pain and joint pain.  Neurological: Negative for dizziness and headaches.   Exam: Physical Exam  Constitutional: She is oriented to person, place, and time.  HENT:  Nose: No mucosal edema.  Mouth/Throat: No oropharyngeal exudate or posterior oropharyngeal edema.  Eyes: Conjunctivae, EOM and lids are normal. Pupils are equal, round, and reactive to light.  Neck: No JVD present. Carotid bruit is not present. No edema present. No thyroid mass and no thyromegaly present.  Cardiovascular: S1 normal and S2 normal.  Exam reveals no gallop.   No murmur heard. Pulses:      Dorsalis pedis pulses are 2+ on the right side, and 2+ on the left side.  Respiratory: No respiratory distress. She has no wheezes. She has no rhonchi. She has no rales.  GI: Soft. Bowel sounds are normal. There is no tenderness.  Musculoskeletal: She exhibits no tenderness.       Right ankle: She exhibits no swelling.       Left ankle: She exhibits no swelling.  Pain in the right sacroiliac area and over lumbar spine to palpation.  Lymphadenopathy:     She has no cervical adenopathy.  Neurological: She is alert and oriented to person, place, and time. No cranial nerve deficit.  Patient barely able to straight leg raise.  Skin: Skin is warm. Nails show no clubbing.  Chronic lower extremity discoloration bilaterally  Psychiatric: She has a normal mood and affect.     Data Reviewed: Basic Metabolic Panel:  Recent Labs Lab 05/11/15 0903 05/12/15 0934 05/13/15 0547 05/13/15 0944 05/16/15 0355  NA 135 133* 136 134* 133*  K 3.7 3.8 3.8 3.9 3.6  CL 96* 95* 96* 95* 92*  CO2 30 32 31 31 35*  GLUCOSE 94 122* 99 127* 94  BUN 41* 32* 21* 23* 20  CREATININE 2.89* 2.32* 1.79* 1.82* 1.68*  CALCIUM 9.5 9.3 9.0 9.3 9.0  MG  --   --  1.8  --   --   PHOS 5.4* 3.7  --  3.1  --    CBC:  Recent Labs Lab 05/11/15 0533 05/12/15 0934 05/13/15 0944 05/16/15 0355  WBC 6.2 11.4* 12.9* 8.7  NEUTROABS  --   --   --  6.0  HGB 7.5* 8.7* 8.3* 7.2*  HCT 22.4* 26.2* 24.9* 21.6*  MCV 96.9 97.9 97.9 98.4  PLT 245 277 260 268   Cardiac Enzymes: No results for input(s): CKTOTAL, CKMB, CKMBINDEX, TROPONINI in the last 168 hours. BNP (last 3 results)  Recent Labs  02/02/15 2121 05/07/15 2256  BNP 228.0* 805.0*      Recent  Results (from the past 240 hour(s))  MRSA PCR Screening     Status: Abnormal   Collection Time: 05/08/15  7:50 AM  Result Value Ref Range Status   MRSA by PCR POSITIVE (A) NEGATIVE Final    Comment:        The GeneXpert MRSA Assay (FDA approved for NASAL specimens only), is one component of a comprehensive MRSA colonization surveillance program. It is not intended to diagnose MRSA infection nor to guide or monitor treatment for MRSA infections. CRITICAL RESULT CALLED TO, READ BACK BY AND VERIFIED WITH: JESSICA CHRISTMAS ON 05/08/15 AT 1022 BY QSD      Studies: No results found.  Scheduled Meds: . amLODipine  10 mg Oral Daily  . aspirin EC  81 mg Oral Daily  . bimatoprost  1 drop Both Eyes BID  .  carvedilol  25 mg Oral BID  . Chlorhexidine Gluconate Cloth  6 each Topical Q0600  . cloNIDine  0.3 mg Oral TID  . docusate sodium  100 mg Oral BID  . [START ON 05/18/2015] epoetin (EPOGEN/PROCRIT) injection  10,000 Units Intravenous Q T,Th,Sa-HD  . feeding supplement (ENSURE ENLIVE)  237 mL Oral TID WC  . fentaNYL  25 mcg Transdermal Q72H  . fluticasone  2 spray Each Nare Daily  . furosemide  40 mg Oral Daily  . gentamicin cream  1 application Topical Daily  . heparin subcutaneous  5,000 Units Subcutaneous 3 times per day  . hydrALAZINE  100 mg Oral QID  . iron polysaccharides  150 mg Oral Daily  . lidocaine  1 patch Transdermal Q24H  . mupirocin ointment  1 application Nasal BID  . nitroGLYCERIN  0.5 inch Topical 4 times per day  . pantoprazole  40 mg Oral BID  . phenytoin  300 mg Oral QHS  . predniSONE  10 mg Oral Q breakfast  . promethazine      . sodium chloride  3 mL Intravenous Q12H    Assessment/Plan:  1.   Accelerated hypertension- blood pressure is labile.  Continue IV hydralazine and labetalol when necessary. Continue Norvasc, hydralazine, Lasix, clonidine and Coreg 12.5 mg twice a day. 2.   Back pain- completed doses of IV Solu-Medrol. Trial of oxycodone. Increased fentanyl patch. Patient wanted to hold off on imaging studies at this point. 3.   Chest pain and borderline troponin- labeled as demand ischemia by cardiology. Conservative management. Continue aspirin.  * Ventricular arrhythmia. Continue current treatment per Dr. Juliann Paresallwood. 4.   Chronic pain- continue fentanyl patch. 5.   Gastroesophageal reflux disease without esophagitis continue Protonix 6.   Chronic kidney disease stage V, progression to ESRD. started hemodialysis on 10/8. Per Dr. Cherylann RatelLateef, can't yet transition to PD as shes transitioning care to a nursing home. Dr. Wyn Quakerew put perma cath yesterfay for permanent HD. No need of HD today, HD tomorrow per Dr. Cherylann RatelLateef.  7.   History of seizure disorder on  phenytoin 8.   Weakness- per physical therapy evaluation, the patient needs skilled nursing facility placement.  9. Chronic diastolic CHF (congestive heart failure) ejection fraction 55-60%. Stable.  * Anemia of chronic disease. Hb down to 7.2, f/u CBC.  I discussed with Dr. Cherylann RatelLateef, RN and social worker. They are working on outpatient dialysis chair. Continue PT after removing temporary cath.  Code Status:     Code Status Orders        Start     Ordered   05/08/15 0514  Full code   Continuous  05/08/15 0513     Family Communication: Spoke with daughter and granddaughter. Disposition Plan: Possible discharge to skilled nursing facility in 2-3 days.  Consultants:  Cardiology  Nephrology  Time spent: 37 minutes  Shaune Pollack  [facility]

## 2015-05-17 NOTE — Progress Notes (Signed)
Per P&T committee, doses of promethazine >12.5mg  are not recommended in patients >79 years of age. Dose adjusted per policy to 12.5mg  Dwanda Tufano D Chalon Zobrist, Pharm.D Clinical Pharmacist

## 2015-05-17 NOTE — Progress Notes (Addendum)
Physical Therapy Treatment Patient Details Name: Kristina RhodesJacquelyn R Brakebill MRN: [id number] DOB: Sep 12, 1922 Today's Date: 05/17/2015    History of Present Illness Kristina Wilson is a 81 y.o. female with a known history of COPD, sleep apnea, CHF, chronic kidney disease, gastroesophageal reflux disease, chronic anemia, chronic back pain, episode of TIA in the past- has been gradually getting weak for last 1 month (recent hospitalization Aug 2016).  Presented to ER this admission secondary to HA and elevated BP; admitted for accelerated HTN and chest pain.  Mild elevation in troponin (peak 0.12), consistent with demand ischemia per MD.    PT Comments    Patient able to tolerate full transition to upright sitting edge of bed with improved weight distribution through bilat hips; level of assist fluctuating from very brief periods of min assist to max assist throughout duration of activity (2-3 min).  Unable to attempt without bilat UE support, unable to progress towards any additional functional activity. Very poor activity tolerance and overall functional performance at this time; continues to be limited by pain in back and R hip.  Anticipate very slow progression towards goals; recommend use of mechanical lift for any OOB attempts at this time. Of note, patient with R IJ permcath placed 10/13; R femoral temp fem cath removed 10/14 (with appropriate bedrest time complete; site clean/dry).   Follow Up Recommendations  SNF     Equipment Recommendations       Recommendations for Other Services       Precautions / Restrictions Precautions Precautions: Fall Precaution Comments: Contact isolation, R IJ permcath Restrictions Weight Bearing Restrictions: No    Mobility  Bed Mobility Overal bed mobility: +2 for physical assistance;Needs Assistance Bed Mobility: Supine to Sit;Sit to Supine     Supine to sit: Max assist;Total assist Sit to supine: Total assist   General bed mobility comments:  dep +2 for scooting up in bed, repositioning for comfort  Transfers                 General transfer comment: unable to tolerate secondary to significant pain with transition to upright  Ambulation/Gait             General Gait Details: unable to tolerate secondary to significant pain with transition to upright   Stairs            Wheelchair Mobility    Modified Rankin (Stroke Patients Only)       Balance Overall balance assessment: Needs assistance Sitting-balance support: No upper extremity supported;Feet supported Sitting balance-Leahy Scale: Poor                              Cognition Arousal/Alertness: Lethargic (per RN, had phenegran this AM) Behavior During Therapy: Flat affect                        Exercises Other Exercises Other Exercises: Sitting edge of bed x2-3 min this date, fluctuating from brief periods of min assist to max assist throughout duration.  Improved ability to evenly distribute weight through bilat hips, but unable to maintain position unsupported or progress towards any additional functional activities.  Very forward flexed posture; poor functional endurance/activity tolerance, requiring return to supine after 2-3 min.    General Comments        Pertinent Vitals/Pain Pain Assessment: Faces Faces Pain Scale: Hurts whole lot Pain Location: back, R hip Pain Descriptors / Indicators: Aching;Grimacing;Guarding;Moaning Pain  Intervention(s): Limited activity within patient's tolerance;Monitored during session;Repositioned    Home Living                      Prior Function            PT Goals (current goals can now be found in the care plan section) Acute Rehab PT Goals Patient Stated Goal: "I've got to lie back down" PT Goal Formulation: With patient/family Time For Goal Achievement: 05/23/15 Potential to Achieve Goals: Fair Progress towards PT goals: Progressing toward goals (very slow,  gradual progression towards upright positioning)    Frequency  Min 2X/week    PT Plan Current plan remains appropriate    Co-evaluation             End of Session   Activity Tolerance: Patient limited by pain Patient left: in bed;with call bell/phone within reach;with bed alarm set     Time: 1610-9604 PT Time Calculation (min) (ACUTE ONLY): 13 min  Charges:  $Therapeutic Activity: 8-22 mins                    G Codes:      Markevius Trombetta H. Manson Passey, PT, DPT, NCS 05/17/2015, 4:06 PM [phone]

## 2015-05-17 NOTE — Clinical Social Work Note (Signed)
CSW was able to speak to Dr. Imogene Burnhen while he was in patient's room this morning. Dr. Imogene Burnhen is aware that patient's temp cath will need to be removed, patient will need to have PT, and patient will have to demonstrate that she can sit up for dialysis. Dr. Cherylann RatelLateef stated that he did not know patient's outpatient schedule yet and CSW has asked RN CM to follow up with Selena BattenKim, dialysis coordinator, to find out patient's schedule. Patient will not discharge today. Physician aware that [facility] would [facility] orders today for a weekend discharge. CSW has spoken to patient's grandaughter and she is now aware of the above. York SpanielMonica Cassie Henkels MSW,LCSW [phone]

## 2015-05-17 NOTE — Progress Notes (Signed)
Telephone call to Dr. Wyn Quakerew, ask if it is okay to remove temp cath in Right groin, Okay to remove per MD.

## 2015-05-17 NOTE — Progress Notes (Addendum)
Pt is alert and oriented, bed bound, on oxygen, poor appetite, 1 ensure, family at bedside, pt c/o nausea in am- per granddaughter request patient given phenergan and remained drowsy throughout shift, sleeping in between care and no prn pain medications provided, temp cath removed, dressing changed on peritoneal dialysis cath, morning medication late bc patient felt to "sick" to take medicines. Pt will likely have dialysis on 10/15, awaiting on pt to tolerate sitting up in chair before she can be d/c to snf, anticipating d/c on 10/17. Vital signs have remained stable, afebrile throughout shift, lactulose given for constipation.

## 2015-05-17 NOTE — Progress Notes (Signed)
RN listened attentively as the dialysis nurse gave report in reference to patient. Dialysis stated that she removed 1 liter. Patient tolerated procedure well. Current BP 148/62. Patient has a rt chest permacath, and a temporary rt groin cath..Marland Kitchen

## 2015-05-18 LAB — BASIC METABOLIC PANEL
ANION GAP: 7 (ref 5–15)
BUN: 20 mg/dL (ref 6–20)
CALCIUM: 8.9 mg/dL (ref 8.9–10.3)
CHLORIDE: 95 mmol/L — AB (ref 101–111)
CO2: 31 mmol/L (ref 22–32)
Creatinine, Ser: 1.81 mg/dL — ABNORMAL HIGH (ref 0.44–1.00)
GFR calc non Af Amer: 23 mL/min — ABNORMAL LOW (ref >60)
GFR, EST AFRICAN AMERICAN: 27 mL/min — AB (ref >60)
GLUCOSE: 88 mg/dL (ref 65–99)
POTASSIUM: 3.6 mmol/L (ref 3.5–5.1)
Sodium: 133 mmol/L — ABNORMAL LOW (ref 135–145)

## 2015-05-18 LAB — CBC
HEMATOCRIT: 21.7 % — AB (ref 35.0–47.0)
HEMOGLOBIN: 7.6 g/dL — AB (ref 12.0–16.0)
MCH: 34.2 pg — ABNORMAL HIGH (ref 26.0–34.0)
MCHC: 34.9 g/dL (ref 32.0–36.0)
MCV: 98 fL (ref 80.0–100.0)
Platelets: 264 10*3/uL (ref 150–440)
RBC: 2.21 MIL/uL — AB (ref 3.80–5.20)
RDW: 13 % (ref 11.5–14.5)
WBC: 7.9 10*3/uL (ref 3.6–11.0)

## 2015-05-18 MED ORDER — NEPRO/CARBSTEADY PO LIQD
237.0000 mL | Freq: Two times a day (BID) | ORAL | Status: DC
  Administered 2015-05-20 – 2015-05-22 (×4): 237 mL via ORAL

## 2015-05-18 MED ORDER — HEPARIN SODIUM (PORCINE) 1000 UNIT/ML IJ SOLN
1000.0000 [IU] | Freq: Once | INTRAMUSCULAR | Status: AC
  Administered 2015-05-18: 3000 [IU]

## 2015-05-18 NOTE — Progress Notes (Signed)
[facility]  ROUNDING NOTE   Subjective:  Pt seen during HD, BFR 400, UF target 1.5kg.  Resting comfortably at the moment.  Objective:  Vital signs in last 24 hours:  Temp:  [97.6 F (36.4 C)-98.9 F (37.2 C)] 97.7 F (36.5 C) (10/15 1115) Pulse Rate:  [63-98] 73 (10/15 1230) Resp:  [13-20] 13 (10/15 1230) BP: (126-209)/(28-122) 132/46 mmHg (10/15 1230) SpO2:  [97 %-99 %] 99 % (10/15 0914) Weight:  [90.357 kg (199 lb 3.2 oz)] 90.357 kg (199 lb 3.2 oz) (10/15 0657)  Weight change: -4.144 kg (-9 lb 2.2 oz) Filed Weights   05/16/15 2050 05/17/15 0500 05/18/15 0657  Weight: 93.5 kg (206 lb 2.1 oz) 90.039 kg (198 lb 8 oz) 90.357 kg (199 lb 3.2 oz)    Intake/Output: I/O last 3 completed shifts: In: 3 [I.V.:3] Out: 1000 [Other:1000]   Intake/Output this shift:  Total I/O In: 100 [P.O.:100] Out: 0   Physical Exam: General: NAD  Head: Normocephalic, atraumatic. Moist oral mucosal membranes  Eyes: Anicteric  Neck: Supple, trachea midline  Lungs:  Clear to auscultation, normal effort  Heart: Irregular, no rubs  Abdomen:  Soft, nontender, +peritoneal dialysis catheter  Extremities: trace peripheral edema.  Neurologic: Nonfocal, moving all four extremities  Skin: No lesions  Access: PD catheter, R IJ Permcath placed 05/16/15.    Basic Metabolic Panel:  Recent Labs Lab 05/12/15 0934 05/13/15 0547 05/13/15 0944 05/16/15 0355 05/18/15 0407  NA 133* 136 134* 133* 133*  K 3.8 3.8 3.9 3.6 3.6  CL 95* 96* 95* 92* 95*  CO2 32 31 31 35* 31  GLUCOSE 122* 99 127* 94 88  BUN 32* 21* 23* 20 20  CREATININE 2.32* 1.79* 1.82* 1.68* 1.81*  CALCIUM 9.3 9.0 9.3 9.0 8.9  MG  --  1.8  --   --   --   PHOS 3.7  --  3.1  --   --     Liver Function Tests:  Recent Labs Lab 05/12/15 0934 05/13/15 0944  ALBUMIN 2.8* 2.8*   No results for input(s): LIPASE, AMYLASE in the last 168 hours. No results for input(s): AMMONIA in the last 168 hours.  CBC:  Recent  Labs Lab 05/12/15 0934 05/13/15 0944 05/16/15 0355 05/18/15 0407  WBC 11.4* 12.9* 8.7 7.9  NEUTROABS  --   --  6.0  --   HGB 8.7* 8.3* 7.2* 7.6*  HCT 26.2* 24.9* 21.6* 21.7*  MCV 97.9 97.9 98.4 98.0  PLT 277 260 268 264    Cardiac Enzymes: No results for input(s): CKTOTAL, CKMB, CKMBINDEX, TROPONINI in the last 168 hours.  BNP: Invalid input(s): POCBNP  CBG:  Recent Labs Lab 05/14/15 0735 05/17/15 0739  GLUCAP 98 81    Microbiology: Results for orders placed or performed during the hospital encounter of 05/07/15  MRSA PCR Screening     Status: Abnormal   Collection Time: 05/08/15  7:50 AM  Result Value Ref Range Status   MRSA by PCR POSITIVE (A) NEGATIVE Final    Comment:        The GeneXpert MRSA Assay (FDA approved for NASAL specimens only), is one component of a comprehensive MRSA colonization surveillance program. It is not intended to diagnose MRSA infection nor to guide or monitor treatment for MRSA infections. CRITICAL RESULT CALLED TO, READ BACK BY AND VERIFIED WITH: JESSICA CHRISTMAS ON 05/08/15 AT 1022 BY QSD     Coagulation Studies: No results for input(s): LABPROT, INR in the last 72 hours.  Urinalysis: No results for input(s): COLORURINE, LABSPEC, PHURINE, GLUCOSEU, HGBUR, BILIRUBINUR, KETONESUR, PROTEINUR, UROBILINOGEN, NITRITE, LEUKOCYTESUR in the last 72 hours.  Invalid input(s): APPERANCEUR    Imaging: No results found.   Medications:     . amLODipine  10 mg Oral Daily  . aspirin EC  81 mg Oral Daily  . bimatoprost  1 drop Both Eyes BID  . carvedilol  25 mg Oral BID  . Chlorhexidine Gluconate Cloth  6 each Topical Q0600  . cloNIDine  0.3 mg Oral TID  . docusate sodium  100 mg Oral BID  . epoetin (EPOGEN/PROCRIT) injection  10,000 Units Intravenous Q T,Th,Sa-HD  . feeding supplement (NEPRO CARB STEADY)  237 mL Oral BID BM  . fentaNYL  25 mcg Transdermal Q72H  . fluticasone  2 spray Each Nare Daily  . furosemide  40 mg Oral  Daily  . gentamicin cream  1 application Topical Daily  . heparin  1,000 Units Intracatheter Once  . heparin subcutaneous  5,000 Units Subcutaneous 3 times per day  . hydrALAZINE  100 mg Oral QID  . iron polysaccharides  150 mg Oral Daily  . lidocaine  1 patch Transdermal Q24H  . mupirocin ointment  1 application Nasal BID  . nitroGLYCERIN  0.5 inch Topical 4 times per day  . pantoprazole  40 mg Oral BID  . phenytoin  300 mg Oral QHS  . predniSONE  10 mg Oral Q breakfast  . sodium chloride  3 mL Intravenous Q12H   acetaminophen, diphenhydrAMINE, hydrALAZINE, labetalol, lactulose, nitroGLYCERIN, ondansetron (ZOFRAN) IV, oxyCODONE, promethazine  Assessment/ Plan:  Kristina Wilson is a 81 y.o. white female with past medical history of hypertension, obstructive sleep apnea, DJD, CVA with residual left sided weakness, carotid stenosis status post L CEA 2/15, seizure disorder, diastolic congestive heart failure, anemia  1. End Stage Renal Disease with proteinuria: progression of diease from Chronic kidney disease stage V. 24 hour creatinine clearance 12. Initiated on hemodialysis on 10/8 through right femoral temp HD catheter.  -Pt seen during HD, tolerating well.  Complete HD today, next HD on Tuesday.  2. Hypertension. Has history of labile blood pressure.Very difficult time treating hypertension and avoiding side effects.  Blood pressures very labile.   -BP at the moment is 132/46, difficult to become anymore aggressive with BP control given lows at times, but pt continues to have labile BP.  3. Secondary hyperparathyroidism. Phos well controlled, will monitor.  4. Anemia chronic kidney disease: hgb up to 7.6, continue epogen.    LOS: 9 Anihya Tuma 10/15/201612:52 PM

## 2015-05-18 NOTE — Progress Notes (Signed)
Nutrition Follow-up      INTERVENTION:  Meals and snacks: Spoke with Dr. Cherylann RatelLateef and agreeable to liberlizing diet to regular at this time to increase po intake Medical Nutrition Supplement Therapy: Recommend switching ensure to nepro BID for additional kcals and protein   NUTRITION DIAGNOSIS:   Inadequate oral intake related to acute illness as evidenced by per patient/family report, being addressed with po diet and supplement    GOAL:   Patient will meet greater than or equal to 90% of their needs    MONITOR:    (Energy Intake, Electrolyte and renal Profile, Anthropometrics)  REASON FOR ASSESSMENT:   Malnutrition Screening Tool    ASSESSMENT:   Planning HD today, perm cath in place   Current Nutrition: ate 3/4 of oatmeal this am, per I and O sheet eating bites since admission, 1 ensure consumed yesterday. Dtr at bedside and reports for the past week poor po intake   Gastrointestinal Profile: Last BM: 10/14   Medications: reviewed  Electrolyte/Renal Profile and Glucose Profile:   Recent Labs Lab 05/12/15 0934 05/13/15 0547 05/13/15 0944 05/16/15 0355 05/18/15 0407  NA 133* 136 134* 133* 133*  K 3.8 3.8 3.9 3.6 3.6  CL 95* 96* 95* 92* 95*  CO2 32 31 31 35* 31  BUN 32* 21* 23* 20 20  CREATININE 2.32* 1.79* 1.82* 1.68* 1.81*  CALCIUM 9.3 9.0 9.3 9.0 8.9  MG  --  1.8  --   --   --   PHOS 3.7  --  3.1  --   --   GLUCOSE 122* 99 127* 94 88   Protein Profile:  Recent Labs Lab 05/12/15 0934 05/13/15 0944  ALBUMIN 2.8* 2.8*      Weight Trend since Admission: Filed Weights   05/16/15 2050 05/17/15 0500 05/18/15 0657  Weight: 206 lb 2.1 oz (93.5 kg) 198 lb 8 oz (90.039 kg) 199 lb 3.2 oz (90.357 kg)      Diet Order:  Diet regular Room service appropriate?: Yes; Fluid consistency:: Thin  Skin: stage II pressure ulcer Height:   Ht Readings from Last 1 Encounters:  05/09/15 5\' 4"  (1.626 m)    Weight:   Wt Readings from Last 1 Encounters:   05/18/15 199 lb 3.2 oz (90.357 kg)       BMI:  Body mass index is 34.18 kg/(m^2).  Estimated Nutritional Needs:   Kcal:  using IBW of 54.6kg, BEE: 951kcals, TEE: (IF 1.1-1.3)(AF 1.2) 1255-1484kcals  Protein:  55-65g protein (1.0-1.2g/kg)  Fluid:  1365-161838mL of fluid (25-5330mL/kg)  EDUCATION NEEDS:   No education needs identified at this time  MODERATE Care Level  Kristina Wilson, RD, LDN [phone])

## 2015-05-18 NOTE — Progress Notes (Signed)
Patient ID: Kristina Wilson, female   DOB: 1922/09/09, 81 y.o.   MRN: [id number] [facility] PROGRESS NOTE  LANECIA SLIVA WUJ:[id number] DOB: 07-06-1923 DOA: 05/07/2015 PCP: Jaclyn Shaggy, MD  HPI/Subjective: Back pain and hip pain.  Objective: Filed Vitals:   05/18/15 1330  BP: 149/63  Pulse: 83  Temp:   Resp: 15    Filed Weights   05/16/15 2050 05/17/15 0500 05/18/15 0657  Weight: 93.5 kg (206 lb 2.1 oz) 90.039 kg (198 lb 8 oz) 90.357 kg (199 lb 3.2 oz)    ROS: Review of Systems  Constitutional: Negative for fever and chills.  Eyes: Negative for blurred vision.  Respiratory: Negative for cough and shortness of breath.   Cardiovascular: Negative for chest pain.  Gastrointestinal: Negative for nausea, vomiting, abdominal pain, diarrhea and constipation.  Genitourinary: Negative for dysuria.  Musculoskeletal: Positive for back pain and joint pain.  Neurological: Negative for dizziness and headaches.   Exam: Physical Exam  Constitutional: She is oriented to person, place, and time.  HENT:  Nose: No mucosal edema.  Mouth/Throat: No oropharyngeal exudate or posterior oropharyngeal edema.  Eyes: Conjunctivae, EOM and lids are normal. Pupils are equal, round, and reactive to light.  Neck: No JVD present. Carotid bruit is not present. No edema present. No thyroid mass and no thyromegaly present.  Cardiovascular: S1 normal and S2 normal.  Exam reveals no gallop.   No murmur heard. Pulses:      Dorsalis pedis pulses are 2+ on the right side, and 2+ on the left side.  Respiratory: No respiratory distress. She has no wheezes. She has no rhonchi. She has no rales.  GI: Soft. Bowel sounds are normal. There is no tenderness.  Musculoskeletal: She exhibits no tenderness.       Right ankle: She exhibits no swelling.       Left ankle: She exhibits no swelling.  Pain in the right sacroiliac area and over lumbar spine to palpation.  Lymphadenopathy:    She has no  cervical adenopathy.  Neurological: She is alert and oriented to person, place, and time. No cranial nerve deficit.  Patient barely able to straight leg raise.  Skin: Skin is warm. Nails show no clubbing.  Chronic lower extremity discoloration bilaterally  Psychiatric: She has a normal mood and affect.     Data Reviewed: Basic Metabolic Panel:  Recent Labs Lab 05/12/15 0934 05/13/15 0547 05/13/15 0944 05/16/15 0355 05/18/15 0407  NA 133* 136 134* 133* 133*  K 3.8 3.8 3.9 3.6 3.6  CL 95* 96* 95* 92* 95*  CO2 32 31 31 35* 31  GLUCOSE 122* 99 127* 94 88  BUN 32* 21* 23* 20 20  CREATININE 2.32* 1.79* 1.82* 1.68* 1.81*  CALCIUM 9.3 9.0 9.3 9.0 8.9  MG  --  1.8  --   --   --   PHOS 3.7  --  3.1  --   --    CBC:  Recent Labs Lab 05/12/15 0934 05/13/15 0944 05/16/15 0355 05/18/15 0407  WBC 11.4* 12.9* 8.7 7.9  NEUTROABS  --   --  6.0  --   HGB 8.7* 8.3* 7.2* 7.6*  HCT 26.2* 24.9* 21.6* 21.7*  MCV 97.9 97.9 98.4 98.0  PLT 277 260 268 264   Cardiac Enzymes: No results for input(s): CKTOTAL, CKMB, CKMBINDEX, TROPONINI in the last 168 hours. BNP (last 3 results)  Recent Labs  02/02/15 2121 05/07/15 2256  BNP 228.0* 805.0*      No  results found for this or any previous visit (from the past 240 hour(s)).   Studies: No results found.  Scheduled Meds: . amLODipine  10 mg Oral Daily  . aspirin EC  81 mg Oral Daily  . bimatoprost  1 drop Both Eyes BID  . carvedilol  25 mg Oral BID  . Chlorhexidine Gluconate Cloth  6 each Topical Q0600  . cloNIDine  0.3 mg Oral TID  . docusate sodium  100 mg Oral BID  . epoetin (EPOGEN/PROCRIT) injection  10,000 Units Intravenous Q T,Th,Sa-HD  . feeding supplement (NEPRO CARB STEADY)  237 mL Oral BID BM  . fentaNYL  25 mcg Transdermal Q72H  . fluticasone  2 spray Each Nare Daily  . furosemide  40 mg Oral Daily  . gentamicin cream  1 application Topical Daily  . heparin  1,000 Units Intracatheter Once  . heparin subcutaneous   5,000 Units Subcutaneous 3 times per day  . hydrALAZINE  100 mg Oral QID  . iron polysaccharides  150 mg Oral Daily  . lidocaine  1 patch Transdermal Q24H  . mupirocin ointment  1 application Nasal BID  . nitroGLYCERIN  0.5 inch Topical 4 times per day  . pantoprazole  40 mg Oral BID  . phenytoin  300 mg Oral QHS  . predniSONE  10 mg Oral Q breakfast  . sodium chloride  3 mL Intravenous Q12H    Assessment/Plan:  1.   Accelerated hypertension- blood pressure is labile.  Continue IV hydralazine and labetalol when necessary. Continue Norvasc, hydralazine, Lasix, clonidine and Coreg 25 mg twice a day. 2.   Chronic Back and hip pain- completed doses of IV Solu-Medrol. Trial of oxycodone. Increased fentanyl patch. Patient wanted to hold off on imaging studies at this point. 3.   Chest pain and borderline troponin- labeled as demand ischemia by cardiology. Conservative management. Continue aspirin.  * Ventricular arrhythmia. Continue current treatment per Dr. Juliann Paresallwood. 4.   Chronic pain- continue fentanyl patch. 5.   Gastroesophageal reflux disease without esophagitis continue Protonix 6.   Chronic kidney disease stage V, progression to ESRD. started hemodialysis on 10/8. Per Dr. Cherylann RatelLateef, can't yet transition to PD as shes transitioning care to a nursing home. Dr. Wyn Quakerew put perma cath for permanent HD. Continue HD today and begin for hemodialysis chair as outpatient per Dr. Cherylann RatelLateef.  7.   History of seizure disorder on phenytoin 8.   Weakness- per physical therapy evaluation, the patient needs skilled nursing facility placement.  9. Chronic diastolic CHF (congestive heart failure) ejection fraction 55-60%. Stable.  * Anemia of chronic disease. Hb is stable.   Continue PT.  Code Status:     Code Status Orders        Start     Ordered   05/08/15 0514  Full code   Continuous     05/08/15 0513     Family Communication: Spoke with daughter and granddaughter. Disposition Plan: Possible  discharge to skilled nursing facility in 2-3 days.  Consultants:  Cardiology  Nephrology  Time spent: 37 minutes  Shaune Pollackhen, Iwalani Templeton  [facility]

## 2015-05-19 NOTE — Progress Notes (Signed)
[facility]  ROUNDING NOTE   Subjective:  Pt had HD yesterday. Tolerated well. Resting in bed this afternoon. Still has back pain.   BP remains quite labile though blood pressure lower this afternoon.  Objective:  Vital signs in last 24 hours:  Temp:  [98.4 F (36.9 C)-98.6 F (37 C)] 98.4 F (36.9 C) (10/16 0506) Pulse Rate:  [39-90] 68 (10/16 1229) Resp:  [1-20] 17 (10/16 1202) BP: (98-186)/(29-81) 111/40 mmHg (10/16 1229) SpO2:  [97 %-99 %] 99 % (10/16 1201) Weight:  [90.22 kg (198 lb 14.4 oz)-91.037 kg (200 lb 11.2 oz)] 90.22 kg (198 lb 14.4 oz) (10/16 0506)  Weight change: 0.68 kg (1 lb 8 oz) Filed Weights   05/18/15 0657 05/18/15 1644 05/19/15 0506  Weight: 90.357 kg (199 lb 3.2 oz) 91.037 kg (200 lb 11.2 oz) 90.22 kg (198 lb 14.4 oz)    Intake/Output: I/O last 3 completed shifts: In: 103 [P.O.:100; I.V.:3] Out: 1000 [Other:1000]   Intake/Output this shift:     Physical Exam: General: NAD  Head: Normocephalic, atraumatic. Moist oral mucosal membranes  Eyes: Anicteric  Neck: Supple, trachea midline  Lungs:  Clear to auscultation, normal effort  Heart: Irregular, no rubs  Abdomen:  Soft, nontender, +peritoneal dialysis catheter  Extremities: trace peripheral edema.  Neurologic: Nonfocal, moving all four extremities  Skin: No lesions  Access: PD catheter, R IJ Permcath placed 05/16/15.    Basic Metabolic Panel:  Recent Labs Lab 05/13/15 0547 05/13/15 0944 05/16/15 0355 05/18/15 0407  NA 136 134* 133* 133*  K 3.8 3.9 3.6 3.6  CL 96* 95* 92* 95*  CO2 31 31 35* 31  GLUCOSE 99 127* 94 88  BUN 21* 23* 20 20  CREATININE 1.79* 1.82* 1.68* 1.81*  CALCIUM 9.0 9.3 9.0 8.9  MG 1.8  --   --   --   PHOS  --  3.1  --   --     Liver Function Tests:  Recent Labs Lab 05/13/15 0944  ALBUMIN 2.8*   No results for input(s): LIPASE, AMYLASE in the last 168 hours. No results for input(s): AMMONIA in the last 168 hours.  CBC:  Recent  Labs Lab 05/13/15 0944 05/16/15 0355 05/18/15 0407  WBC 12.9* 8.7 7.9  NEUTROABS  --  6.0  --   HGB 8.3* 7.2* 7.6*  HCT 24.9* 21.6* 21.7*  MCV 97.9 98.4 98.0  PLT 260 268 264    Cardiac Enzymes: No results for input(s): CKTOTAL, CKMB, CKMBINDEX, TROPONINI in the last 168 hours.  BNP: Invalid input(s): POCBNP  CBG:  Recent Labs Lab 05/14/15 0735 05/17/15 0739  GLUCAP 98 81    Microbiology: Results for orders placed or performed during the hospital encounter of 05/07/15  MRSA PCR Screening     Status: Abnormal   Collection Time: 05/08/15  7:50 AM  Result Value Ref Range Status   MRSA by PCR POSITIVE (A) NEGATIVE Final    Comment:        The GeneXpert MRSA Assay (FDA approved for NASAL specimens only), is one component of a comprehensive MRSA colonization surveillance program. It is not intended to diagnose MRSA infection nor to guide or monitor treatment for MRSA infections. CRITICAL RESULT CALLED TO, READ BACK BY AND VERIFIED WITH: Kristina Wilson ON 05/08/15 AT 1022 BY QSD     Coagulation Studies: No results for input(s): LABPROT, INR in the last 72 hours.  Urinalysis: No results for input(s): COLORURINE, LABSPEC, PHURINE, GLUCOSEU, HGBUR, BILIRUBINUR, KETONESUR, PROTEINUR, UROBILINOGEN,  NITRITE, LEUKOCYTESUR in the last 72 hours.  Invalid input(s): APPERANCEUR    Imaging: No results found.   Medications:     . amLODipine  10 mg Oral Daily  . aspirin EC  81 mg Oral Daily  . bimatoprost  1 drop Both Eyes BID  . carvedilol  25 mg Oral BID  . Chlorhexidine Gluconate Cloth  6 each Topical Q0600  . cloNIDine  0.3 mg Oral TID  . docusate sodium  100 mg Oral BID  . epoetin (EPOGEN/PROCRIT) injection  10,000 Units Intravenous Q T,Th,Sa-HD  . feeding supplement (NEPRO CARB STEADY)  237 mL Oral BID BM  . fentaNYL  25 mcg Transdermal Q72H  . fluticasone  2 spray Each Nare Daily  . furosemide  40 mg Oral Daily  . gentamicin cream  1 application  Topical Daily  . heparin subcutaneous  5,000 Units Subcutaneous 3 times per day  . hydrALAZINE  100 mg Oral QID  . iron polysaccharides  150 mg Oral Daily  . lidocaine  1 patch Transdermal Q24H  . mupirocin ointment  1 application Nasal BID  . nitroGLYCERIN  0.5 inch Topical 4 times per day  . pantoprazole  40 mg Oral BID  . phenytoin  300 mg Oral QHS  . predniSONE  10 mg Oral Q breakfast  . sodium chloride  3 mL Intravenous Q12H   acetaminophen, diphenhydrAMINE, hydrALAZINE, labetalol, lactulose, nitroGLYCERIN, ondansetron (ZOFRAN) IV, oxyCODONE, promethazine  Assessment/ Plan:  Kristina Wilson is a 81 y.o. white female with past medical history of hypertension, obstructive sleep apnea, DJD, CVA with residual left sided weakness, carotid stenosis status post L CEA 2/15, seizure disorder, diastolic congestive heart failure, anemia  1. End Stage Renal Disease with proteinuria: progression of diease from Chronic kidney disease stage V. 24 hour creatinine clearance 12. Initiated on hemodialysis on 10/8 through right femoral temp HD catheter.  -Pt had HD yesterday, next HD scheduled for Tuesday.  Remains quite weak overall and remains bed bound at this time.    2. Hypertension. Has history of labile blood pressure.Very difficult time treating hypertension and avoiding side effects.  Blood pressures very labile.   -BP 111/40 this afternoon, nitropaste to be stopped.  Otherwise continue amlodipine, clonidine, coreg, and hydralazine.  3. Secondary hyperparathyroidism. Would continue to periodically monitor phos, phos well controlled at last check.  4. Anemia chronic kidney disease: hgb was 7.6 at last check, continue epogen 10000 units IV with HD.     LOS: 10 Kristina Wilson 10/16/20162:18 PM

## 2015-05-19 NOTE — Progress Notes (Signed)
Dr. Imogene Burnhen aware of BP drop since morning meds were given. Ordered to hold nitro paste due at this time. Previous nitro paste removed. Will continue to monitor.

## 2015-05-19 NOTE — Progress Notes (Signed)
Patient ID: Kristina Wilson, female   DOB: 1922-12-07, 81 y.o.   MRN: [id number] [facility] PROGRESS NOTE  Kristina Wilson MVH:[id number] DOB: January 29, 1923 DOA: 05/07/2015 PCP: Jaclyn Shaggy, MD  HPI/Subjective: Chronic Back pain and hip pain.  Objective: Filed Vitals:   05/19/15 1043  BP: 178/81  Pulse: 90  Temp:   Resp:     Filed Weights   05/18/15 0657 05/18/15 1644 05/19/15 0506  Weight: 90.357 kg (199 lb 3.2 oz) 91.037 kg (200 lb 11.2 oz) 90.22 kg (198 lb 14.4 oz)    ROS: Review of Systems  Constitutional: Negative for fever and chills.  Eyes: Negative for blurred vision.  Respiratory: Negative for cough and shortness of breath.   Cardiovascular: Negative for chest pain.  Gastrointestinal: Negative for nausea, vomiting, abdominal pain, diarrhea and constipation.  Genitourinary: Negative for dysuria.  Musculoskeletal: Positive for back pain and joint pain.  Neurological: Negative for dizziness and headaches.   Exam: Physical Exam  Constitutional: She is oriented to person, place, and time.  HENT:  Nose: No mucosal edema.  Mouth/Throat: No oropharyngeal exudate or posterior oropharyngeal edema.  Eyes: Conjunctivae, EOM and lids are normal. Pupils are equal, round, and reactive to light.  Neck: No JVD present. Carotid bruit is not present. No edema present. No thyroid mass and no thyromegaly present.  Cardiovascular: S1 normal and S2 normal.  Exam reveals no gallop.   No murmur heard. Pulses:      Dorsalis pedis pulses are 2+ on the right side, and 2+ on the left side.  Respiratory: No respiratory distress. She has no wheezes. She has no rhonchi. She has no rales.  GI: Soft. Bowel sounds are normal. There is no tenderness.  Musculoskeletal: She exhibits no tenderness.       Left elbow: She exhibits no swelling.       Right wrist: She exhibits swelling.       Left wrist: She exhibits swelling.       Right ankle: She exhibits no swelling.       Left  ankle: She exhibits no swelling.  Pain in the right sacroiliac area and over lumbar spine to palpation.  Lymphadenopathy:    She has no cervical adenopathy.  Neurological: She is alert and oriented to person, place, and time. No cranial nerve deficit.  Patient barely able to straight leg raise.  Skin: Skin is warm. Nails show no clubbing.  Chronic lower extremity discoloration bilaterally  Psychiatric: She has a normal mood and affect.     Data Reviewed: Basic Metabolic Panel:  Recent Labs Lab 05/13/15 0547 05/13/15 0944 05/16/15 0355 05/18/15 0407  NA 136 134* 133* 133*  K 3.8 3.9 3.6 3.6  CL 96* 95* 92* 95*  CO2 31 31 35* 31  GLUCOSE 99 127* 94 88  BUN 21* 23* 20 20  CREATININE 1.79* 1.82* 1.68* 1.81*  CALCIUM 9.0 9.3 9.0 8.9  MG 1.8  --   --   --   PHOS  --  3.1  --   --    CBC:  Recent Labs Lab 05/13/15 0944 05/16/15 0355 05/18/15 0407  WBC 12.9* 8.7 7.9  NEUTROABS  --  6.0  --   HGB 8.3* 7.2* 7.6*  HCT 24.9* 21.6* 21.7*  MCV 97.9 98.4 98.0  PLT 260 268 264   Cardiac Enzymes: No results for input(s): CKTOTAL, CKMB, CKMBINDEX, TROPONINI in the last 168 hours. BNP (last 3 results)  Recent Labs  02/02/15 2121 05/07/15  2256  BNP 228.0* 805.0*      No results found for this or any previous visit (from the past 240 hour(s)).   Studies: No results found.  Scheduled Meds: . amLODipine  10 mg Oral Daily  . aspirin EC  81 mg Oral Daily  . bimatoprost  1 drop Both Eyes BID  . carvedilol  25 mg Oral BID  . Chlorhexidine Gluconate Cloth  6 each Topical Q0600  . cloNIDine  0.3 mg Oral TID  . docusate sodium  100 mg Oral BID  . epoetin (EPOGEN/PROCRIT) injection  10,000 Units Intravenous Q T,Th,Sa-HD  . feeding supplement (NEPRO CARB STEADY)  237 mL Oral BID BM  . fentaNYL  25 mcg Transdermal Q72H  . fluticasone  2 spray Each Nare Daily  . furosemide  40 mg Oral Daily  . gentamicin cream  1 application Topical Daily  . heparin subcutaneous  5,000  Units Subcutaneous 3 times per day  . hydrALAZINE  100 mg Oral QID  . iron polysaccharides  150 mg Oral Daily  . lidocaine  1 patch Transdermal Q24H  . mupirocin ointment  1 application Nasal BID  . nitroGLYCERIN  0.5 inch Topical 4 times per day  . pantoprazole  40 mg Oral BID  . phenytoin  300 mg Oral QHS  . predniSONE  10 mg Oral Q breakfast  . sodium chloride  3 mL Intravenous Q12H    Assessment/Plan:  1.   Accelerated hypertension- blood pressure is labile.  Continue IV hydralazine and labetalol when necessary. Continue Norvasc, hydralazine, Lasix, clonidine and Coreg 25 mg twice a day. 2.   Chronic Back and hip pain- completed doses of IV Solu-Medrol. Trial of oxycodone. Increased fentanyl patch. Patient wanted to hold off on imaging studies at this point. 3.   Chest pain and borderline troponin- labeled as demand ischemia by cardiology. Conservative management. Continue aspirin.  * Ventricular arrhythmia. Continue current treatment per Dr. Juliann Paresallwood. 4.   Chronic pain- continue fentanyl patch. 5.   Gastroesophageal reflux disease without esophagitis continue Protonix 6.   Chronic kidney disease stage V, progression to ESRD. started hemodialysis on 10/8. Per Dr. Cherylann RatelLateef, can't yet transition to PD as shes transitioning care to a nursing home. Dr. Wyn Quakerew put perma cath for permanent HD. Continue HD and waiting for hemodialysis chair setup as outpatient per Dr. Cherylann RatelLateef.  7.   History of seizure disorder on phenytoin 8.   Weakness- per physical therapy evaluation, the patient needs skilled nursing facility placement.  9. Chronic diastolic CHF (congestive heart failure) ejection fraction 55-60%. Stable.  * Anemia of chronic disease. Hb is stable.   Continue PT.  Code Status:     Code Status Orders        Start     Ordered   05/08/15 0514  Full code   Continuous     05/08/15 0513     Family Communication: Spoke with daughter and granddaughter. Disposition Plan: Possible  discharge to skilled nursing facility in 2 days.  Consultants:  Cardiology  Nephrology  Time spent: 37 minutes  Shaune Pollackhen, Cassie Shedlock  [facility]

## 2015-05-20 ENCOUNTER — Inpatient Hospital Stay: Payer: Medicare Other

## 2015-05-20 MED ORDER — ACETAMINOPHEN 325 MG PO TABS
650.0000 mg | ORAL_TABLET | Freq: Four times a day (QID) | ORAL | Status: DC | PRN
  Administered 2015-05-21 – 2015-05-22 (×2): 650 mg via ORAL
  Filled 2015-05-20 (×2): qty 2

## 2015-05-20 MED ORDER — LIDOCAINE 5 % EX PTCH
2.0000 | MEDICATED_PATCH | CUTANEOUS | Status: DC
  Administered 2015-05-21 – 2015-05-22 (×2): 2 via TRANSDERMAL
  Filled 2015-05-20 (×3): qty 2

## 2015-05-20 NOTE — Care Management (Addendum)
It is reported to attending by family that patient that patient is experiencing pain in her right hip which is preventing her from being able to ambulate or sit.  Lidoderm patch and xray ordered.  Spoke with physical therapy regarding need to attempt daily treatment.  Awaiting results of hip/pelivs xray before attempting treatment today

## 2015-05-20 NOTE — Progress Notes (Signed)
Subjective:   Resting in bed today. Was nauseus earlier. Received phenergan Did not sleep well last night according to grand daughter. BP remains quite labile    Objective:  Vital signs in last 24 hours:  Temp:  [97.8 F (36.6 C)-98.1 F (36.7 C)] 97.8 F (36.6 C) (10/17 0411) Pulse Rate:  [58-90] 79 (10/17 0411) Resp:  [1-18] 18 (10/17 0411) BP: (98-198)/(29-81) 162/74 mmHg (10/17 0922) SpO2:  [97 %-99 %] 97 % (10/17 0411) Weight:  [90.81 kg (200 lb 3.2 oz)] 90.81 kg (200 lb 3.2 oz) (10/17 0411)  Weight change: -0.227 kg (-8 oz) Filed Weights   05/18/15 1644 05/19/15 0506 05/20/15 0411  Weight: 91.037 kg (200 lb 11.2 oz) 90.22 kg (198 lb 14.4 oz) 90.81 kg (200 lb 3.2 oz)    Intake/Output: I/O last 3 completed shifts: In: -  Out: 200 [Urine:200]   Intake/Output this shift:  Total I/O In: 150 [P.O.:150] Out: -   Physical Exam: General: NAD, frail , elderly  Head: Normocephalic, atraumatic. Moist oral mucosal membranes  Eyes: Anicteric  Neck: Supple, trachea midline  Lungs:  Clear to auscultation, normal effort  Heart: Irregular, no rubs  Abdomen:  Soft, nontender, +peritoneal dialysis catheter  Extremities: trace peripheral edema.  Neurologic: Nonfocal, moving all four extremities  Skin: No lesions  Access: PD catheter, R IJ Permcath placed 05/16/15.    Basic Metabolic Panel:  Recent Labs Lab 05/16/15 0355 05/18/15 0407  NA 133* 133*  K 3.6 3.6  CL 92* 95*  CO2 35* 31  GLUCOSE 94 88  BUN 20 20  CREATININE 1.68* 1.81*  CALCIUM 9.0 8.9    Liver Function Tests: No results for input(s): AST, ALT, ALKPHOS, BILITOT, PROT, ALBUMIN in the last 168 hours. No results for input(s): LIPASE, AMYLASE in the last 168 hours. No results for input(s): AMMONIA in the last 168 hours.  CBC:  Recent Labs Lab 05/16/15 0355 05/18/15 0407  WBC 8.7 7.9  NEUTROABS 6.0  --   HGB 7.2* 7.6*  HCT 21.6* 21.7*  MCV 98.4 98.0  PLT 268 264    Cardiac Enzymes: No  results for input(s): CKTOTAL, CKMB, CKMBINDEX, TROPONINI in the last 168 hours.  BNP: Invalid input(s): POCBNP  CBG:  Recent Labs Lab 05/14/15 0735 05/17/15 0739  GLUCAP 98 81    Microbiology: Results for orders placed or performed during the hospital encounter of 05/07/15  MRSA PCR Screening     Status: Abnormal   Collection Time: 05/08/15  7:50 AM  Result Value Ref Range Status   MRSA by PCR POSITIVE (A) NEGATIVE Final    Comment:        The GeneXpert MRSA Assay (FDA approved for NASAL specimens only), is one component of a comprehensive MRSA colonization surveillance program. It is not intended to diagnose MRSA infection nor to guide or monitor treatment for MRSA infections. CRITICAL RESULT CALLED TO, READ BACK BY AND VERIFIED WITH: JESSICA CHRISTMAS ON 05/08/15 AT 1022 BY QSD     Coagulation Studies: No results for input(s): LABPROT, INR in the last 72 hours.  Urinalysis: No results for input(s): COLORURINE, LABSPEC, PHURINE, GLUCOSEU, HGBUR, BILIRUBINUR, KETONESUR, PROTEINUR, UROBILINOGEN, NITRITE, LEUKOCYTESUR in the last 72 hours.  Invalid input(s): APPERANCEUR    Imaging: No results found.   Medications:     . amLODipine  10 mg Oral Daily  . aspirin EC  81 mg Oral Daily  . bimatoprost  1 drop Both Eyes BID  . carvedilol  25 mg Oral BID  .  cloNIDine  0.3 mg Oral TID  . docusate sodium  100 mg Oral BID  . epoetin (EPOGEN/PROCRIT) injection  10,000 Units Intravenous Q T,Th,Sa-HD  . feeding supplement (NEPRO CARB STEADY)  237 mL Oral BID BM  . fentaNYL  25 mcg Transdermal Q72H  . fluticasone  2 spray Each Nare Daily  . furosemide  40 mg Oral Daily  . gentamicin cream  1 application Topical Daily  . heparin subcutaneous  5,000 Units Subcutaneous 3 times per day  . hydrALAZINE  100 mg Oral QID  . iron polysaccharides  150 mg Oral Daily  . lidocaine  1 patch Transdermal Q24H  . mupirocin ointment  1 application Nasal BID  . nitroGLYCERIN  0.5 inch  Topical 4 times per day  . pantoprazole  40 mg Oral BID  . phenytoin  300 mg Oral QHS  . predniSONE  10 mg Oral Q breakfast  . sodium chloride  3 mL Intravenous Q12H   acetaminophen, diphenhydrAMINE, hydrALAZINE, labetalol, lactulose, nitroGLYCERIN, ondansetron (ZOFRAN) IV, oxyCODONE, promethazine  Assessment/ Plan:  [name] with past medical history of hypertension, obstructive sleep apnea, DJD, CVA with residual left sided weakness, carotid stenosis status post L CEA 2/15, seizure disorder, diastolic congestive heart failure, anemia  1. End Stage Renal Disease with proteinuria: progression of diease from Chronic kidney disease stage V. 24 hour creatinine clearance 12. Initiated on hemodialysis on 10/8 through right femoral temp HD catheter.  -next HD scheduled for Tuesday.  Remains quite weak overall and remains bed bound at this time.   - try to sit up in chair - trial of HD in chair tomorrow  2. Hypertension. Has history of labile blood pressure.Very difficult time treating hypertension and avoiding side effects.  Blood pressures very labile.   -  continue amlodipine, clonidine, coreg, and hydralazine. -  Try to define allergy to enalapril as patient would benefit from ARB is she is able to take it  3. Secondary hyperparathyroidism. Would continue to periodically monitor phos, phos well controlled at last check.  4. Anemia chronic kidney disease: hgb was 7.6 at last check, continue epogen 10000 units IV with HD.   Discussed with family (grand daughter)   LOS: 11  Kanan Sobek 10/17/201610:14 AM

## 2015-05-20 NOTE — Care Management Note (Signed)
Patients placement is completed and [facility].  I am following patient closely for discharge.  At this time we are waiting to see if patient is going to be able to sit for her dialysis treatments.  Plan is to try having her sit up for her treatment tomorrow.  Ivor ReiningKim Broghan Pannone  Dialysis Liaison [phone]

## 2015-05-20 NOTE — Progress Notes (Signed)
B/P was 187/53 in right arm lying (by machine), B/P rechecked and was 172/66 in left arm lying  (manually).  Will continue to monitor pt.

## 2015-05-20 NOTE — Progress Notes (Signed)
MEDICATION RELATED CONSULT NOTE - INITIAL   Pharmacy Consult for Phenytoin dosing/monitoring Indication: seizure  Allergies  Allergen Reactions  . Enalapril Anaphylaxis  . Tramadol Shortness Of Breath  . Capsaicin Other (See Comments)    Reaction:  Unknown   . Codeine Other (See Comments)    Reaction:  Unknown   . Hydrocodone Other (See Comments)    Reaction:  Unknown   . Vicodin [Hydrocodone-Acetaminophen] Other (See Comments)    Reaction:  Unknown     Patient Measurements: Height: 5\' 4"  (162.6 cm) Weight: 200 lb 3.2 oz (90.81 kg) IBW/kg (Calculated) : 54.7   Vital Signs: Temp: 97.8 F (36.6 C) (10/17 0411) Temp Source: Oral (10/17 0411) BP: 162/74 mmHg (10/17 0922) Pulse Rate: 79 (10/17 0411) Intake/Output from previous day: 10/16 0701 - 10/17 0700 In: -  Out: 200 [Urine:200] Intake/Output from this shift: Total I/O In: 150 [P.O.:150] Out: -   Labs:  Recent Labs  05/18/15 0407  WBC 7.9  HGB 7.6*  HCT 21.7*  PLT 264  CREATININE 1.81*   Estimated Creatinine Clearance: 22.5 mL/min (by C-G formula based on Cr of 1.81).   Microbiology: Recent Results (from the past 720 hour(s))  MRSA PCR Screening     Status: Abnormal   Collection Time: 05/08/15  7:50 AM  Result Value Ref Range Status   MRSA by PCR POSITIVE (A) NEGATIVE Final    Comment:        The GeneXpert MRSA Assay (FDA approved for NASAL specimens only), is one component of a comprehensive MRSA colonization surveillance program. It is not intended to diagnose MRSA infection nor to guide or monitor treatment for MRSA infections. CRITICAL RESULT CALLED TO, READ BACK BY AND VERIFIED WITH: JESSICA CHRISTMAS ON 05/08/15 AT 1022 BY QSD     Medical History: Past Medical History  Diagnosis Date  . Muscle weakness   . Chronic respiratory failure (HCC)   . Pneumonia   . Sleep apnea   . CHF (congestive heart failure) (HCC)   . CKD (chronic kidney disease), stage IV (HCC)   . Dysphagia   .  GERD (gastroesophageal reflux disease)   . Back pain   . Epilepsy (HCC)   . Hypertension   . Stroke (HCC)   . Headache   . Arthritis   . Anemia     iron deficiency anemia  . Complication of anesthesia     difficult waking up after surgery (2013)    Medications:  Scheduled:  . amLODipine  10 mg Oral Daily  . aspirin EC  81 mg Oral Daily  . bimatoprost  1 drop Both Eyes BID  . carvedilol  25 mg Oral BID  . cloNIDine  0.3 mg Oral TID  . docusate sodium  100 mg Oral BID  . epoetin (EPOGEN/PROCRIT) injection  10,000 Units Intravenous Q T,Th,Sa-HD  . feeding supplement (NEPRO CARB STEADY)  237 mL Oral BID BM  . fentaNYL  25 mcg Transdermal Q72H  . fluticasone  2 spray Each Nare Daily  . furosemide  40 mg Oral Daily  . gentamicin cream  1 application Topical Daily  . heparin subcutaneous  5,000 Units Subcutaneous 3 times per day  . hydrALAZINE  100 mg Oral QID  . iron polysaccharides  150 mg Oral Daily  . [START ON 05/21/2015] lidocaine  2 patch Transdermal Q24H  . mupirocin ointment  1 application Nasal BID  . nitroGLYCERIN  0.5 inch Topical 4 times per day  . pantoprazole  40 mg  Oral BID  . phenytoin  300 mg Oral QHS  . predniSONE  10 mg Oral Q breakfast  . sodium chloride  3 mL Intravenous Q12H    Assessment: Pharmacy consulted to dose/monitor phenytoin in a 81 yo female with CKD.  Patient started on HD on 10/8.  Last phenytoin level on 10/5 of 6.0. Corrected with renal failure to 15.8.  Goal of Therapy:  Phenytoin level of 10-20 mg/L   Plan:  Will plan to check albumin and total phenytoin level in AM prior to 1000 dose.    Pharmacy will continue to follow.  Tolulope Pinkett G 05/20/2015,2:40 PM

## 2015-05-20 NOTE — Progress Notes (Signed)
Physical Therapy Treatment Patient Details Name: Kristina Wilson MRN: [id number] DOB: 08-10-1922 Today's Date: 05/20/2015    History of Present Illness Kristina Wilson is a 81 y.o. female with a known history of COPD, sleep apnea, CHF, chronic kidney disease, gastroesophageal reflux disease, chronic anemia, chronic back pain, episode of TIA in the past- has been gradually getting weak for last 1 month (recent hospitalization Aug 2016).  Presented to ER this admission secondary to HA and elevated BP; admitted for accelerated HTN and chest pain.  Mild elevation in troponin (peak 0.12), consistent with demand ischemia per MD.    PT Comments    Patient continues to express significant levels of pain and inability to tolerate sitting up in this session for any appreciable amount of time. Patient does demonstrate decreased tenderness over her posterior R hip and is able to provide increased effort with scooting up in bed as well as rolling/turning in bed. At this time patient really is unable to bring herself to sitting without significant physical assistance given her significant level of deconditioning and pain in her RLE. She states today was actually more painful with sitting than previous sessions. Skilled PT services continue to be indicated to address the above deficits.   Follow Up Recommendations  SNF     Equipment Recommendations       Recommendations for Other Services       Precautions / Restrictions Precautions Precautions: Fall Precaution Comments: Contact isolation, R IJ permcath Restrictions Weight Bearing Restrictions: No    Mobility  Bed Mobility Overal bed mobility: Needs Assistance Bed Mobility: Supine to Sit;Sit to Supine     Supine to sit: Mod assist;Max assist Sit to supine: Mod assist;Max assist   General bed mobility comments: Patient is able to roll onto her L side, however coming to sitting takes prolonged effort and time secondary to significant  trunkal deconditioning and weakness.  Transfers                 General transfer comment: unable to tolerate secondary to significant pain with transition to upright  Ambulation/Gait                 Stairs            Wheelchair Mobility    Modified Rankin (Stroke Patients Only)       Balance Overall balance assessment: Needs assistance Sitting-balance support: Feet unsupported Sitting balance-Leahy Scale: Zero Sitting balance - Comments: Patient unable to tolerate sitting posture, significant trunk flexion noted and L lean throughout. Able to maintain roughly 1-2 minutes sitting prior to asking to return to laying position.                             Cognition Arousal/Alertness: Awake/alert Behavior During Therapy: WFL for tasks assessed/performed Overall Cognitive Status: Within Functional Limits for tasks assessed (Hard of hearing)                      Exercises Other Exercises Other Exercises: Long axis distraction grade I to RLE, piriformis stretching manually (felt stretch but did not feel pain), no pain with SLR, abduction, and heel slides.     General Comments        Pertinent Vitals/Pain Pain Assessment: Faces Faces Pain Scale: Hurts even more Pain Location: back, hip Pain Descriptors / Indicators: Aching;Grimacing;Guarding Pain Intervention(s): Limited activity within patient's tolerance;Monitored during session;Repositioned    Home Living  Prior Function            PT Goals (current goals can now be found in the care plan section) Acute Rehab PT Goals Patient Stated Goal: "I've got to lie back down" PT Goal Formulation: With patient/family Time For Goal Achievement: 05/23/15 Potential to Achieve Goals: Fair Additional Goals Additional Goal #1: Unsupported sitting balance edge of bed, mod indep, for participation with ADLs and functional activities. Additional Goal #2: Assess and  establish goals for OOB as appropriate. Progress towards PT goals: Progressing toward goals (Patient is able to sit up today with better rolling and more activity from RLE. She is not as tender over RLE)    Frequency  Min 2X/week    PT Plan Current plan remains appropriate    Co-evaluation             End of Session   Activity Tolerance: Patient limited by pain Patient left: in bed;with call bell/phone within reach;with bed alarm set     Time: [id number] PT Time Calculation (min) (ACUTE ONLY): 28 min  Charges:  $Therapeutic Exercise: 8-22 mins $Therapeutic Activity: 8-22 mins                    G Codes:      Kerin RansomPatrick A McNamara, PT, DPT    05/20/2015, 5:30 PM

## 2015-05-20 NOTE — Progress Notes (Signed)
During shift assessment it was noted right heel was boggy and red, redness blanchable but slow to respond. Foam dressing applied and heels floated. Will continue to monitor.

## 2015-05-21 LAB — PHENYTOIN LEVEL, TOTAL: Phenytoin Lvl: 4.6 ug/mL — ABNORMAL LOW (ref 10.0–20.0)

## 2015-05-21 LAB — ALBUMIN: Albumin: 2.5 g/dL — ABNORMAL LOW (ref 3.5–5.0)

## 2015-05-21 MED ORDER — AMLODIPINE BESYLATE 10 MG PO TABS
10.0000 mg | ORAL_TABLET | Freq: Every day | ORAL | Status: DC
  Administered 2015-05-21: 10 mg via ORAL
  Filled 2015-05-21: qty 1

## 2015-05-21 MED ORDER — CARVEDILOL 12.5 MG PO TABS: 25.0000 mg | ORAL_TABLET | Freq: Every day | ORAL | Status: DC

## 2015-05-21 MED ORDER — HYDRALAZINE HCL 50 MG PO TABS
100.0000 mg | ORAL_TABLET | Freq: Three times a day (TID) | ORAL | Status: DC
  Administered 2015-05-21 – 2015-05-22 (×4): 100 mg via ORAL
  Filled 2015-05-21 (×4): qty 2

## 2015-05-21 NOTE — Care Management (Signed)
Goal today is  to attempt chair dialysis .  Nursing is aware

## 2015-05-21 NOTE — Progress Notes (Signed)
Pt up to chair with no issues noted/lungs diminished/trace edema/BIl LE/denies N/V/D/access intact with no complications noted/ will continue to monitor

## 2015-05-21 NOTE — Progress Notes (Signed)
MEDICATION RELATED CONSULT NOTE - Follow Up  Pharmacy Consult for Phenytoin dosing/monitoring Indication: seizure  Allergies  Allergen Reactions  . Enalapril Anaphylaxis  . Tramadol Shortness Of Breath  . Capsaicin Other (See Comments)    Reaction:  Unknown   . Codeine Other (See Comments)    Reaction:  Unknown   . Hydrocodone Other (See Comments)    Reaction:  Unknown   . Vicodin [Hydrocodone-Acetaminophen] Other (See Comments)    Reaction:  Unknown     Patient Measurements: Height: 5\' 4"  (162.6 cm) Weight: 201 lb 8 oz (91.4 kg) IBW/kg (Calculated) : 54.7   Vital Signs: Temp: 97.7 F (36.5 C) (10/18 0938) Temp Source: Oral (10/18 0938) BP: 174/61 mmHg (10/18 1146) Pulse Rate: 78 (10/18 1146) Intake/Output from previous day: 10/17 0701 - 10/18 0700 In: 150 [P.O.:150] Out: 150 [Urine:150] Intake/Output from this shift:    Labs:  Recent Labs  05/21/15 0804  ALBUMIN 2.5*   Estimated Creatinine Clearance: 22.6 mL/min (by C-G formula based on Cr of 1.81).   Microbiology: Recent Results (from the past 720 hour(s))  MRSA PCR Screening     Status: Abnormal   Collection Time: 05/08/15  7:50 AM  Result Value Ref Range Status   MRSA by PCR POSITIVE (A) NEGATIVE Final    Comment:        The GeneXpert MRSA Assay (FDA approved for NASAL specimens only), is one component of a comprehensive MRSA colonization surveillance program. It is not intended to diagnose MRSA infection nor to guide or monitor treatment for MRSA infections. CRITICAL RESULT CALLED TO, READ BACK BY AND VERIFIED WITH: JESSICA CHRISTMAS ON 05/08/15 AT 1022 BY QSD     Medical History: Past Medical History  Diagnosis Date  . Muscle weakness   . Chronic respiratory failure (HCC)   . Pneumonia   . Sleep apnea   . CHF (congestive heart failure) (HCC)   . CKD (chronic kidney disease), stage IV (HCC)   . Dysphagia   . GERD (gastroesophageal reflux disease)   . Back pain   . Epilepsy (HCC)    . Hypertension   . Stroke (HCC)   . Headache   . Arthritis   . Anemia     iron deficiency anemia  . Complication of anesthesia     difficult waking up after surgery (2013)    Medications:  Scheduled:  . amLODipine  10 mg Oral Daily  . aspirin EC  81 mg Oral Daily  . bimatoprost  1 drop Both Eyes BID  . carvedilol  25 mg Oral BID  . cloNIDine  0.3 mg Oral TID  . docusate sodium  100 mg Oral BID  . epoetin (EPOGEN/PROCRIT) injection  10,000 Units Intravenous Q T,Th,Sa-HD  . feeding supplement (NEPRO CARB STEADY)  237 mL Oral BID BM  . fentaNYL  25 mcg Transdermal Q72H  . fluticasone  2 spray Each Nare Daily  . furosemide  40 mg Oral Daily  . gentamicin cream  1 application Topical Daily  . heparin subcutaneous  5,000 Units Subcutaneous 3 times per day  . hydrALAZINE  100 mg Oral QID  . iron polysaccharides  150 mg Oral Daily  . lidocaine  2 patch Transdermal Q24H  . nitroGLYCERIN  0.5 inch Topical 4 times per day  . pantoprazole  40 mg Oral BID  . phenytoin  300 mg Oral QHS  . predniSONE  10 mg Oral Q breakfast  . sodium chloride  3 mL Intravenous Q12H  Assessment: Pharmacy consulted to dose/monitor phenytoin in a 81 yo female with CKD.  Patient started on HD on 10/8.  Last phenytoin level on 10/5 of 6.0. Corrected with renal failure to 15.8.  10/18: Phenytoin level of 4.6, Albumin of 2.5, Corrected Phenytoin level of 12.1  Goal of Therapy:  Phenytoin level of 10-20 mg/L   Plan:  Corrected phenytoin level for low albumin is within desired range of 10-20 mg/L.  Will continue current dosing.  Consider rechecking in 1 week.   Pharmacy will continue to follow.  Clarisa Schools, PharmD Clinical Pharmacist 05/21/2015

## 2015-05-21 NOTE — Progress Notes (Signed)
Patient ID: Kristina RhodesJacquelyn R Icenhour, female   DOB: Dec 21, 1922, 81 y.o.   MRN: [id number] [facility] PROGRESS NOTE  Kristina RhodesJacquelyn R Noto WUJ:[id number] DOB: Dec 21, 1922 DOA: 05/07/2015 PCP: Jaclyn ShaggyATE,DENNY C, MD  HPI/Subjective: Chronic Back pain and hip pain.  Daughter in room- she confirms pt having this excessive  Pain in lower back and right hip- and not able to sit due to that.  Objective: Filed Vitals:   05/21/15 0543  BP: 183/62  Pulse: 78  Temp: 98.1 F (36.7 C)  Resp: 20    Filed Weights   05/19/15 0506 05/20/15 0411 05/21/15 0543  Weight: 90.22 kg (198 lb 14.4 oz) 90.81 kg (200 lb 3.2 oz) 91.491 kg (201 lb 11.2 oz)    ROS: Review of Systems  Constitutional: Negative for fever and chills.  Eyes: Negative for blurred vision.  Respiratory: Negative for cough and shortness of breath.   Cardiovascular: Negative for chest pain.  Gastrointestinal: Negative for nausea, vomiting, abdominal pain, diarrhea and constipation.  Genitourinary: Negative for dysuria.  Musculoskeletal: Positive for back pain and joint pain.  Neurological: Negative for dizziness and headaches.   Exam: Physical Exam  Constitutional: She is oriented to person, place, and time.  HENT:  Nose: No mucosal edema.  Mouth/Throat: No oropharyngeal exudate or posterior oropharyngeal edema.  Eyes: Conjunctivae, EOM and lids are normal. Pupils are equal, round, and reactive to light.  Neck: No JVD present. Carotid bruit is not present. No edema present. No thyroid mass and no thyromegaly present.  Cardiovascular: S1 normal and S2 normal.  Exam reveals no gallop.   No murmur heard. Pulses:      Dorsalis pedis pulses are 2+ on the right side, and 2+ on the left side.  Respiratory: No respiratory distress. She has no wheezes. She has no rhonchi. She has no rales.  GI: Soft. Bowel sounds are normal. There is no tenderness.  Musculoskeletal: She exhibits no tenderness.       Left elbow: She exhibits no swelling.        Right wrist: She exhibits swelling.       Left wrist: She exhibits swelling.       Right ankle: She exhibits no swelling.       Left ankle: She exhibits no swelling.  Pain in the right sacroiliac area and over lumbar spine to palpation.  Lymphadenopathy:    She has no cervical adenopathy.  Neurological: She is alert and oriented to person, place, and time. No cranial nerve deficit.  Patient barely able to straight leg raise.  Skin: Skin is warm. Nails show no clubbing.  Chronic lower extremity discoloration bilaterally  Psychiatric: She has a normal mood and affect.     Data Reviewed: Basic Metabolic Panel:  Recent Labs Lab 05/16/15 0355 05/18/15 0407  NA 133* 133*  K 3.6 3.6  CL 92* 95*  CO2 35* 31  GLUCOSE 94 88  BUN 20 20  CREATININE 1.68* 1.81*  CALCIUM 9.0 8.9   CBC:  Recent Labs Lab 05/16/15 0355 05/18/15 0407  WBC 8.7 7.9  NEUTROABS 6.0  --   HGB 7.2* 7.6*  HCT 21.6* 21.7*  MCV 98.4 98.0  PLT 268 264   Cardiac Enzymes: No results for input(s): CKTOTAL, CKMB, CKMBINDEX, TROPONINI in the last 168 hours. BNP (last 3 results)  Recent Labs  02/02/15 2121 05/07/15 2256  BNP 228.0* 805.0*      No results found for this or any previous visit (from the past 240 hour(s)).  Studies: Dg Hip Unilat With Pelvis 2-3 Views Right  05/20/2015  CLINICAL DATA:  Right hip pain without reported injury. EXAM: DG HIP (WITH OR WITHOUT PELVIS) 2-3V RIGHT COMPARISON:  None. FINDINGS: There is no evidence of hip fracture or dislocation. There is no evidence of arthropathy or other focal bone abnormality. IMPRESSION: Normal right hip. Electronically Signed   By: Lupita Raider, M.D.   On: 05/20/2015 12:31    Scheduled Meds: . amLODipine  10 mg Oral Daily  . aspirin EC  81 mg Oral Daily  . bimatoprost  1 drop Both Eyes BID  . carvedilol  25 mg Oral BID  . cloNIDine  0.3 mg Oral TID  . docusate sodium  100 mg Oral BID  . epoetin (EPOGEN/PROCRIT) injection   10,000 Units Intravenous Q T,Th,Sa-HD  . feeding supplement (NEPRO CARB STEADY)  237 mL Oral BID BM  . fentaNYL  25 mcg Transdermal Q72H  . fluticasone  2 spray Each Nare Daily  . furosemide  40 mg Oral Daily  . gentamicin cream  1 application Topical Daily  . heparin subcutaneous  5,000 Units Subcutaneous 3 times per day  . hydrALAZINE  100 mg Oral QID  . iron polysaccharides  150 mg Oral Daily  . lidocaine  2 patch Transdermal Q24H  . nitroGLYCERIN  0.5 inch Topical 4 times per day  . pantoprazole  40 mg Oral BID  . phenytoin  300 mg Oral QHS  . predniSONE  10 mg Oral Q breakfast  . sodium chloride  3 mL Intravenous Q12H    Assessment/Plan:  1.   Accelerated hypertension- blood pressure is labile.  Continue IV hydralazine and labetalol when necessary. Continue Norvasc, hydralazine, Lasix, clonidine and Coreg 25 mg twice a day. 2.   Chronic Back and hip pain- completed doses of IV Solu-Medrol. Trial of oxycodone. Increased fentanyl patch. Do Xray right hip to r/o fracture. On lidocaine patch at local application. 3.   Chest pain and borderline troponin- labeled as demand ischemia by cardiology. Conservative management. Continue aspirin.  * Ventricular arrhythmia. Continue current treatment per Dr. Juliann Pares. 4.   Chronic pain- continue fentanyl patch. 5.   Gastroesophageal reflux disease without esophagitis continue Protonix 6.   Chronic kidney disease stage V, progression to ESRD. started hemodialysis on 10/8. Per Dr. Cherylann Ratel, can't yet transition to PD as shes transitioning care to a nursing home. Dr. Wyn Quaker put perma cath for HD. Continue HD and waiting for hemodialysis chair setup as outpatient per Dr. Cherylann Ratel.  She is not able to sit in chair for HD due to chronic pains.  7.   History of seizure disorder on phenytoin 8.   Weakness- per physical therapy evaluation, the patient needs skilled nursing facility placement.  9. Chronic diastolic CHF (congestive heart failure) ejection  fraction 55-60%. Stable.  10. Anemia of chronic disease. Hb is stable.   Continue PT.  Code Status:     Code Status Orders        Start     Ordered   05/08/15 0514  Full code   Continuous     05/08/15 0513     Family Communication: Spoke with daughter and granddaughter. Disposition Plan: Possible discharge to skilled nursing facility in 2-3  days.  Consultants:  Cardiology  Nephrology  Time spent: 30 minutes  Bianka Liberati, [facility]  [facility]

## 2015-05-21 NOTE — Progress Notes (Signed)
PT Cancellation Note  Patient Details Name: Kathie RhodesJacquelyn R Koors MRN: [id number] DOB: 1922-12-22   Cancelled Treatment:    Reason Eval/Treat Not Completed: Patient at procedure or test/unavailable (Treatment session attempted; patient currently off unit for dialysis.  Will re-attempt at later time/date as patient available and medically appropriate.)   Renad Jenniges H. Manson PasseyBrown, PT, DPT, NCS 05/21/2015, 9:51 AM [id number]

## 2015-05-21 NOTE — Clinical Social Work Note (Signed)
Patient was sent down to dialysis in her chair this morning. If she can tolerate dialysis in a chair, patient's daughter wishes for CSW to inform [facility] as [facility] had previously stated they would consider her only if she could sit up in a wheelchair.  York SpanielMonica Armando Lauman MSW,LCSW [phone]

## 2015-05-21 NOTE — Clinical Social Work Note (Signed)
Patient tolerated sitting up in a chair for the duration of her dialysis. Patient's daughter requested CSW attempt to try to see if [facility] accept her now that she can sit up. CSW has spoken to [facility] and [facility] has stated that patient's outpatient dialysis will need to be on MWF rather than TTS because they do not have Saturday transportation and cannot rely on family of any patient to transport patient on saturdays. CSW has requested RN CM to ask Ivor ReiningKim Riddle (dialysis coordinator) to see if patient's outpatient dialysis can be changed to MWF so that the family can get their bed of choice. If [facility] does not have a bed for patient, [facility] that [facility]. York SpanielMonica Nancyann Cotterman MSW,LCSW [phone]

## 2015-05-21 NOTE — Progress Notes (Signed)
Patient ID: Kristina Wilson, female   DOB: [birth year], 81 y.o.   MRN: [id number] [facility] PROGRESS NOTE  Kristina Wilson:[id number] DOB: Mar 19, 1923 DOA: 05/07/2015 PCP: Jaclyn Shaggy, MD  HPI/Subjective: Chronic Back pain and hip pain.  Daughter in room- she confirms pt having this excessive  Pain in lower back and right hip- xray checked - did not show any acute fractures, started on lidocaine patch locally- and today she was able to sit during her HD.  Objective: Filed Vitals:   05/21/15 2150  BP: 178/62  Pulse: 68  Temp:   Resp:     Filed Weights   05/20/15 0411 05/21/15 0543 05/21/15 0938  Weight: 90.81 kg (200 lb 3.2 oz) 91.491 kg (201 lb 11.2 oz) 91.4 kg (201 lb 8 oz)    ROS: Review of Systems  Constitutional: Negative for fever and chills.  Eyes: Negative for blurred vision.  Respiratory: Negative for cough and shortness of breath.   Cardiovascular: Negative for chest pain.  Gastrointestinal: Negative for nausea, vomiting, abdominal pain, diarrhea and constipation.  Genitourinary: Negative for dysuria.  Musculoskeletal: Positive for back pain and joint pain.  Neurological: Negative for dizziness and headaches.   Exam: Physical Exam  Constitutional: She is oriented to person, place, and time.  HENT:  Nose: No mucosal edema.  Mouth/Throat: No oropharyngeal exudate or posterior oropharyngeal edema.  Eyes: Conjunctivae, EOM and lids are normal. Pupils are equal, round, and reactive to light.  Neck: No JVD present. Carotid bruit is not present. No edema present. No thyroid mass and no thyromegaly present.  Cardiovascular: S1 normal and S2 normal.  Exam reveals no gallop.   No murmur heard. Pulses:      Dorsalis pedis pulses are 2+ on the right side, and 2+ on the left side.  Respiratory: No respiratory distress. She has no wheezes. She has no rhonchi. She has no rales.  GI: Soft. Bowel sounds are normal. There is no tenderness.   Musculoskeletal: She exhibits no tenderness.       Left elbow: She exhibits no swelling.       Right wrist: She exhibits swelling.       Left wrist: She exhibits swelling.       Right ankle: She exhibits no swelling.       Left ankle: She exhibits no swelling.  Pain in the right sacroiliac area and over lumbar spine to palpation.  Lymphadenopathy:    She has no cervical adenopathy.  Neurological: She is alert and oriented to person, place, and time. No cranial nerve deficit.  Patient barely able to straight leg raise.  Skin: Skin is warm. Nails show no clubbing.  Chronic lower extremity discoloration bilaterally  Psychiatric: She has a normal mood and affect.     Data Reviewed: Basic Metabolic Panel:  Recent Labs Lab 05/16/15 0355 05/18/15 0407  NA 133* 133*  K 3.6 3.6  CL 92* 95*  CO2 35* 31  GLUCOSE 94 88  BUN 20 20  CREATININE 1.68* 1.81*  CALCIUM 9.0 8.9   CBC:  Recent Labs Lab 05/16/15 0355 05/18/15 0407  WBC 8.7 7.9  NEUTROABS 6.0  --   HGB 7.2* 7.6*  HCT 21.6* 21.7*  MCV 98.4 98.0  PLT 268 264   Cardiac Enzymes: No results for input(s): CKTOTAL, CKMB, CKMBINDEX, TROPONINI in the last 168 hours. BNP (last 3 results)  Recent Labs  02/02/15 2121 05/07/15 2256  BNP 228.0* 805.0*      No results  found for this or any previous visit (from the past 240 hour(s)).   Studies: Dg Hip Unilat With Pelvis 2-3 Views Right  05/20/2015  CLINICAL DATA:  Right hip pain without reported injury. EXAM: DG HIP (WITH OR WITHOUT PELVIS) 2-3V RIGHT COMPARISON:  None. FINDINGS: There is no evidence of hip fracture or dislocation. There is no evidence of arthropathy or other focal bone abnormality. IMPRESSION: Normal right hip. Electronically Signed   By: Lupita RaiderJames  Green Jr, M.D.   On: 05/20/2015 12:31    Scheduled Meds: . amLODipine  10 mg Oral QPC supper  . aspirin EC  81 mg Oral Daily  . bimatoprost  1 drop Both Eyes BID  . [START ON 05/22/2015] carvedilol  25 mg  Oral QHS  . cloNIDine  0.3 mg Oral TID  . docusate sodium  100 mg Oral BID  . epoetin (EPOGEN/PROCRIT) injection  10,000 Units Intravenous Q T,Th,Sa-HD  . feeding supplement (NEPRO CARB STEADY)  237 mL Oral BID BM  . fentaNYL  25 mcg Transdermal Q72H  . fluticasone  2 spray Each Nare Daily  . furosemide  40 mg Oral Daily  . gentamicin cream  1 application Topical Daily  . heparin subcutaneous  5,000 Units Subcutaneous 3 times per day  . hydrALAZINE  100 mg Oral TID  . iron polysaccharides  150 mg Oral Daily  . lidocaine  2 patch Transdermal Q24H  . nitroGLYCERIN  0.5 inch Topical 4 times per day  . pantoprazole  40 mg Oral BID  . phenytoin  300 mg Oral QHS  . predniSONE  10 mg Oral Q breakfast  . sodium chloride  3 mL Intravenous Q12H    Assessment/Plan:  1.   Accelerated hypertension- blood pressure is labile.  Continue IV hydralazine and labetalol when necessary. Continue Norvasc, hydralazine, Lasix, clonidine and Coreg 25 mg twice a day. i will goal to keep SBP between 100- 170. 2.   Chronic Back and hip pain- completed doses of IV Solu-Medrol. Trial of oxycodone. 25 mcg fentanyl patch.   Xray right hip to r/o fracture- is negative. On lidocaine patch at local application.  Much better controlled pain now- today was able to sit during her HD. 3.   Chest pain and borderline troponin- labeled as demand ischemia by cardiology. Conservative management. Continue aspirin.  * Ventricular arrhythmia. Continue current treatment [facility]. 4.   Chronic pain- continue fentanyl patch.added lidocaine. 5.   Gastroesophageal reflux disease without esophagitis continue Protonix 6.   Chronic kidney disease stage V, progression to ESRD. started hemodialysis on 10/8. Per Dr. Cherylann RatelLateef, can't yet transition to PD as shes transitioning care to a nursing home. Dr. Wyn Quakerew put perma cath for HD. Continue HD and waiting for hemodialysis chair setup as outpatient .  As now able to sit- will start looking  for SNF place. 7.   History of seizure disorder on phenytoin 8.   Weakness- per physical therapy evaluation, the patient needs skilled nursing facility placement.  9. Chronic diastolic CHF (congestive heart failure) ejection fraction 55-60%. Stable.  10. Anemia of chronic disease. Hb is stable.   Continue PT.  Code Status:     Code Status Orders        Start     Ordered   05/08/15 0514  Full code   Continuous     05/08/15 0513     Family Communication: Spoke with daughter and granddaughter. Disposition Plan: Possible discharge to skilled nursing facility in 2-3  days.  Consultants:  Cardiology  Nephrology  Time spent: 30 minutes  Caryl Manas, [facility]  [facility]

## 2015-05-21 NOTE — Progress Notes (Signed)
Patient has no acute event overnight. She rested for most of the night. She was NSR, and received PRN hydralazine for BP>180. Patient's daughter was at the bedside overnight.

## 2015-05-21 NOTE — Clinical Social Work Note (Signed)
RN CM has stated Kristina BattenKim with dialysis has stated that there are [facility] MWF slots [facility] [place]. CSW has requested that Kristina BattenKim look into another [facility] that might have a MWF opening because patient's daughter stated she is willing to change dialysis centers if it will ensure [facility] bed [facility]. York SpanielMonica Anndrea Mihelich MSW,LCSW [phone]

## 2015-05-21 NOTE — Progress Notes (Signed)
Subjective:   Patient seen during dialysis Tolerating well  Sitting in chair at present for dialysis   Objective:  Vital signs in last 24 hours:  Temp:  [97.7 F (36.5 C)-98.1 F (36.7 C)] 97.7 F (36.5 C) (10/18 0938) Pulse Rate:  [63-89] 78 (10/18 1146) Resp:  [13-20] 17 (10/18 1146) BP: (120-201)/(38-62) 174/61 mmHg (10/18 1146) SpO2:  [97 %-100 %] 99 % (10/18 1146) Weight:  [91.4 kg (201 lb 8 oz)-91.491 kg (201 lb 11.2 oz)] 91.4 kg (201 lb 8 oz) (10/18 6295)  Weight change: 0.68 kg (1 lb 8 oz) Filed Weights   05/20/15 0411 05/21/15 0543 05/21/15 0938  Weight: 90.81 kg (200 lb 3.2 oz) 91.491 kg (201 lb 11.2 oz) 91.4 kg (201 lb 8 oz)    Intake/Output: I/O last 3 completed shifts: In: 150 [P.O.:150] Out: 350 [Urine:350]   Intake/Output this shift:     Physical Exam: General: NAD, frail , elderly  Head: Normocephalic, atraumatic. Moist oral mucosal membranes  Eyes: Anicteric  Neck: Supple, trachea midline  Lungs:  Clear to auscultation, normal effort  Heart: Irregular, no rubs  Abdomen:  Soft, nontender, +peritoneal dialysis catheter  Extremities: trace peripheral edema.  Neurologic: Nonfocal, moving all four extremities  Skin: No lesions  Access: PD catheter, R IJ Permcath placed 05/16/15.    Basic Metabolic Panel:  Recent Labs Lab 05/16/15 0355 05/18/15 0407  NA 133* 133*  K 3.6 3.6  CL 92* 95*  CO2 35* 31  GLUCOSE 94 88  BUN 20 20  CREATININE 1.68* 1.81*  CALCIUM 9.0 8.9    Liver Function Tests:  Recent Labs Lab 05/21/15 0804  ALBUMIN 2.5*   No results for input(s): LIPASE, AMYLASE in the last 168 hours. No results for input(s): AMMONIA in the last 168 hours.  CBC:  Recent Labs Lab 05/16/15 0355 05/18/15 0407  WBC 8.7 7.9  NEUTROABS 6.0  --   HGB 7.2* 7.6*  HCT 21.6* 21.7*  MCV 98.4 98.0  PLT 268 264    Cardiac Enzymes: No results for input(s): CKTOTAL, CKMB, CKMBINDEX, TROPONINI in the last 168 hours.  BNP: Invalid  input(s): POCBNP  CBG:  Recent Labs Lab 05/17/15 0739  GLUCAP 81    Microbiology: Results for orders placed or performed during the hospital encounter of 05/07/15  MRSA PCR Screening     Status: Abnormal   Collection Time: 05/08/15  7:50 AM  Result Value Ref Range Status   MRSA by PCR POSITIVE (A) NEGATIVE Final    Comment:        The GeneXpert MRSA Assay (FDA approved for NASAL specimens only), is one component of a comprehensive MRSA colonization surveillance program. It is not intended to diagnose MRSA infection nor to guide or monitor treatment for MRSA infections. CRITICAL RESULT CALLED TO, READ BACK BY AND VERIFIED WITH: JESSICA CHRISTMAS ON 05/08/15 AT 1022 BY QSD     Coagulation Studies: No results for input(s): LABPROT, INR in the last 72 hours.  Urinalysis: No results for input(s): COLORURINE, LABSPEC, PHURINE, GLUCOSEU, HGBUR, BILIRUBINUR, KETONESUR, PROTEINUR, UROBILINOGEN, NITRITE, LEUKOCYTESUR in the last 72 hours.  Invalid input(s): APPERANCEUR    Imaging: Dg Hip Unilat With Pelvis 2-3 Views Right  05/20/2015  CLINICAL DATA:  Right hip pain without reported injury. EXAM: DG HIP (WITH OR WITHOUT PELVIS) 2-3V RIGHT COMPARISON:  None. FINDINGS: There is no evidence of hip fracture or dislocation. There is no evidence of arthropathy or other focal bone abnormality. IMPRESSION: Normal right hip. Electronically Signed  By: Lupita RaiderJames  Green Jr, M.D.   On: 05/20/2015 12:31     Medications:     . amLODipine  10 mg Oral Daily  . aspirin EC  81 mg Oral Daily  . bimatoprost  1 drop Both Eyes BID  . carvedilol  25 mg Oral BID  . cloNIDine  0.3 mg Oral TID  . docusate sodium  100 mg Oral BID  . epoetin (EPOGEN/PROCRIT) injection  10,000 Units Intravenous Q T,Th,Sa-HD  . feeding supplement (NEPRO CARB STEADY)  237 mL Oral BID BM  . fentaNYL  25 mcg Transdermal Q72H  . fluticasone  2 spray Each Nare Daily  . furosemide  40 mg Oral Daily  . gentamicin cream  1  application Topical Daily  . heparin subcutaneous  5,000 Units Subcutaneous 3 times per day  . hydrALAZINE  100 mg Oral QID  . iron polysaccharides  150 mg Oral Daily  . lidocaine  2 patch Transdermal Q24H  . nitroGLYCERIN  0.5 inch Topical 4 times per day  . pantoprazole  40 mg Oral BID  . phenytoin  300 mg Oral QHS  . predniSONE  10 mg Oral Q breakfast  . sodium chloride  3 mL Intravenous Q12H   acetaminophen, diphenhydrAMINE, hydrALAZINE, labetalol, lactulose, nitroGLYCERIN, ondansetron (ZOFRAN) IV, oxyCODONE, promethazine  Assessment/ Plan:  Ms. Kathie RhodesJacquelyn R Clover is a 81 y.o. white female with past medical history of hypertension, obstructive sleep apnea, DJD, CVA with residual left sided weakness, carotid stenosis status post L CEA 2/15, seizure disorder, diastolic congestive heart failure, anemia  1. End Stage Renal Disease with proteinuria: progression of disease from Chronic kidney disease stage V. 24 hour creatinine clearance 12. Initiated on hemodialysis on 10/8 through right femoral temp HD catheter.  -Patient seen during dialysis Tolerating well in chair  2. Hypertension. Has history of labile blood pressure.Very difficult time treating hypertension and avoiding side effects.  Blood pressures very labile.   -  continue amlodipine, clonidine, coreg, and hydralazine.  - change meds to be taken at night to avoid hypotension during dialysis -  Try to define allergy to enalapril as patient would benefit from ARB if she is able to take it  3. Secondary hyperparathyroidism. Would continue to periodically monitor phos, phos well controlled at last check.  4. Anemia chronic kidney disease: hgb was 7.6 at last check, continue epogen 10000 units IV with HD.   Discussed with family (daughter)   LOS: 12  Kameka Whan 10/18/201612:20 PM

## 2015-05-22 LAB — BASIC METABOLIC PANEL
Anion gap: 6 (ref 5–15)
BUN: 13 mg/dL (ref 6–20)
CALCIUM: 9 mg/dL (ref 8.9–10.3)
CHLORIDE: 95 mmol/L — AB (ref 101–111)
CO2: 34 mmol/L — AB (ref 22–32)
CREATININE: 1.24 mg/dL — AB (ref 0.44–1.00)
GFR calc non Af Amer: 37 mL/min — ABNORMAL LOW (ref >60)
GFR, EST AFRICAN AMERICAN: 43 mL/min — AB (ref >60)
Glucose, Bld: 98 mg/dL (ref 65–99)
Potassium: 3.3 mmol/L — ABNORMAL LOW (ref 3.5–5.1)
SODIUM: 135 mmol/L (ref 135–145)

## 2015-05-22 LAB — GLUCOSE, CAPILLARY: Glucose-Capillary: 120 mg/dL — ABNORMAL HIGH (ref 65–99)

## 2015-05-22 LAB — CBC
HEMATOCRIT: 22.8 % — AB (ref 35.0–47.0)
HEMOGLOBIN: 7.6 g/dL — AB (ref 12.0–16.0)
MCH: 32.1 pg (ref 26.0–34.0)
MCHC: 33.2 g/dL (ref 32.0–36.0)
MCV: 96.6 fL (ref 80.0–100.0)
Platelets: 303 10*3/uL (ref 150–440)
RBC: 2.36 MIL/uL — ABNORMAL LOW (ref 3.80–5.20)
RDW: 12.9 % (ref 11.5–14.5)
WBC: 6.1 10*3/uL (ref 3.6–11.0)

## 2015-05-22 MED ORDER — ASPIRIN 81 MG PO TBEC: 81.0000 mg | DELAYED_RELEASE_TABLET | Freq: Every day | ORAL | Status: DC

## 2015-05-22 MED ORDER — AMLODIPINE BESYLATE 10 MG PO TABS: 10.0000 mg | ORAL_TABLET | Freq: Every day | ORAL | Status: DC

## 2015-05-22 MED ORDER — FENTANYL 25 MCG/HR TD PT72: 25.0000 ug | MEDICATED_PATCH | TRANSDERMAL | Status: DC

## 2015-05-22 MED ORDER — BIMATOPROST 0.01 % OP SOLN: 1.0000 [drp] | Freq: Two times a day (BID) | OPHTHALMIC | Status: DC

## 2015-05-22 MED ORDER — POLYSACCHARIDE IRON COMPLEX 150 MG PO CAPS: 150.0000 mg | ORAL_CAPSULE | Freq: Every day | ORAL | Status: DC

## 2015-05-22 MED ORDER — CARVEDILOL 25 MG PO TABS: 25.0000 mg | ORAL_TABLET | Freq: Every day | ORAL | Status: DC

## 2015-05-22 MED ORDER — OXYCODONE HCL 5 MG PO TABS: 5.0000 mg | ORAL_TABLET | Freq: Three times a day (TID) | ORAL | Status: DC | PRN

## 2015-05-22 MED ORDER — LIDOCAINE 5 % EX PTCH: 2.0000 | MEDICATED_PATCH | CUTANEOUS | Status: DC

## 2015-05-22 MED ORDER — NITROGLYCERIN 0.4 MG SL SUBL: 0.4000 mg | SUBLINGUAL_TABLET | SUBLINGUAL | Status: DC | PRN

## 2015-05-22 MED ORDER — LACTULOSE 10 GM/15ML PO SOLN: 20.0000 g | Freq: Every day | ORAL | Status: DC | PRN

## 2015-05-22 MED ORDER — HYDRALAZINE HCL 100 MG PO TABS: 100.0000 mg | ORAL_TABLET | Freq: Three times a day (TID) | ORAL | Status: DC

## 2015-05-22 NOTE — Discharge Summary (Signed)
[facility]   PATIENT NAME: Kristina Wilson    MR#:  [id number]  DATE OF BIRTH:  10/02/1922  DATE OF ADMISSION:  05/07/2015 ADMITTING PHYSICIAN: Oralia Manis, MD  DATE OF DISCHARGE: 05/22/2015  PRIMARY CARE PHYSICIAN: Jaclyn Shaggy, MD    ADMISSION DIAGNOSIS:  Elevated troponin [R79.89] Secondary hypertension, unspecified [I15.9] Congestive heart failure, unspecified congestive heart failure chronicity, unspecified congestive heart failure type (HCC) [I50.9]  DISCHARGE DIAGNOSIS:  Principal Problem:   Accelerated hypertension Active Problems:   CKD (chronic kidney disease), stage IV (HCC)   Elevated troponin   GERD (gastroesophageal reflux disease)   Epilepsy (HCC)   Chronic CHF (congestive heart failure) (HCC)   Pressure ulcer    ESRD on Hemodialysis now.  SECONDARY DIAGNOSIS:   Past Medical History  Diagnosis Date  . Muscle weakness   . Chronic respiratory failure (HCC)   . Pneumonia   . Sleep apnea   . CHF (congestive heart failure) (HCC)   . CKD (chronic kidney disease), stage IV (HCC)   . Dysphagia   . GERD (gastroesophageal reflux disease)   . Back pain   . Epilepsy (HCC)   . Hypertension   . Stroke (HCC)   . Headache   . Arthritis   . Anemia     iron deficiency anemia  . Complication of anesthesia     difficult waking up after surgery (2013)    HOSPITAL COURSE:   1. Accelerated hypertension- blood pressure is labile. Given IV hydralazine and labetalol when necessary. Continue Norvasc, hydralazine, Lasix, clonidine and Coreg 25 mg twice a day. i will goal to keep SBP between 100- 170. 2. Chronic Back and hip pain- completed doses of IV Solu-Medrol. Trial of oxycodone. 25 mcg fentanyl patch.  Xray right hip to r/o fracture- is negative. On lidocaine patch at local application. Much better controlled pain now- was able to sit during her HD.   She was also on prednisone 10 mg daily for sciatica  pain. Continue. 3. Chest pain and borderline troponin- labeled as demand ischemia by cardiology. Conservative management. Continue aspirin.  * Ventricular arrhythmia. Continue current treatment per cardiology Dr. Juliann Pares. 4. Chronic pain- continue fentanyl patch.added lidocaine. 5. Gastroesophageal reflux disease without esophagitis continue Protonix 6. Chronic kidney disease stage V, progression to ESRD now. started hemodialysis on 10/8. Per Dr. Cherylann Ratel, can't yet transition to PD as she is transitioning care to a nursing home. Dr. Wyn Quaker put perma cath for HD. Continue HD and waiting for hemodialysis chair setup as outpatient . As now able to sit- Need for SNF place.   Family need to be trained for PD , as to be continued at home evantually. 7. History of seizure disorder on phenytoin 8. Weakness- per physical therapy evaluation, the patient needs skilled nursing facility placement.  9. Chronic diastolic CHF (congestive heart failure) ejection fraction 55-60%. Stable.   DISCHARGE CONDITIONS:   Stable.  CONSULTS OBTAINED:  Treatment Team:  Lamar Blinks, MD Lamont Dowdy, MD Shaune Pollack, MD Alwyn Pea, MD  DRUG ALLERGIES:   Allergies  Allergen Reactions  . Enalapril Anaphylaxis  . Tramadol Shortness Of Breath  . Capsaicin Other (See Comments)    Reaction:  Unknown   . Codeine Other (See Comments)    Reaction:  Unknown   . Hydrocodone Other (See Comments)    Reaction:  Unknown   . Vicodin [Hydrocodone-Acetaminophen] Other (See Comments)    Reaction:  Unknown     DISCHARGE  MEDICATIONS:   Current Discharge Medication List    START taking these medications   Details  amLODipine (NORVASC) 10 MG tablet Take 1 tablet (10 mg total) by mouth daily after supper. Qty: 30 tablet, Refills: 0    aspirin EC 81 MG EC tablet Take 1 tablet (81 mg total) by mouth daily. Qty: 30 tablet, Refills: 0    bimatoprost (LUMIGAN) 0.01 % SOLN Place 1 drop into both eyes  2 (two) times daily. Qty: 5 mL, Refills: 1    fentaNYL (DURAGESIC - DOSED MCG/HR) 25 MCG/HR patch Place 1 patch (25 mcg total) onto the skin every 3 (three) days. Qty: 5 patch, Refills: 0    iron polysaccharides (NIFEREX) 150 MG capsule Take 1 capsule (150 mg total) by mouth daily. Qty: 30 capsule, Refills: 0    lactulose (CHRONULAC) 10 GM/15ML solution Take 30 mLs (20 g total) by mouth daily as needed for mild constipation. Qty: 240 mL, Refills: 0    nitroGLYCERIN (NITROSTAT) 0.4 MG SL tablet Place 1 tablet (0.4 mg total) under the tongue every 5 (five) minutes as needed for chest pain. Qty: 20 tablet, Refills: 0    oxyCODONE (OXY IR/ROXICODONE) 5 MG immediate release tablet Take 1 tablet (5 mg total) by mouth 3 (three) times daily as needed for moderate pain or breakthrough pain. Qty: 30 tablet, Refills: 0      CONTINUE these medications which have CHANGED   Details  carvedilol (COREG) 25 MG tablet Take 1 tablet (25 mg total) by mouth at bedtime. Qty: 30 tablet, Refills: 0    hydrALAZINE (APRESOLINE) 100 MG tablet Take 1 tablet (100 mg total) by mouth 3 (three) times daily. Qty: 90 tablet, Refills: 0    lidocaine (LIDODERM) 5 % Place 2 patches onto the skin daily. Remove & Discard patch within 12 hours or as directed by MD Qty: 30 patch, Refills: 0      CONTINUE these medications which have NOT CHANGED   Details  acetaminophen (TYLENOL) 325 MG tablet Take 650 mg by mouth every 4 (four) hours as needed for mild pain, fever or headache.     cloNIDine (CATAPRES) 0.3 MG tablet Take 1 tablet (0.3 mg total) by mouth 3 (three) times daily.    Coenzyme Q10 (CO Q 10 PO) Take 1 tablet by mouth daily.    Cyanocobalamin (VITAMIN B 12 PO) Take 1 capsule by mouth daily.    docusate sodium (COLACE) 100 MG capsule Take 100 mg by mouth every other day.    fluticasone (FLONASE) 50 MCG/ACT nasal spray Place 2 sprays into both nostrils daily. Refills: 2    furosemide (LASIX) 40 MG tablet  Take 40 mg by mouth daily.    gabapentin (NEURONTIN) 100 MG capsule Take 100 mg by mouth 3 (three) times daily.    gentamicin cream (GARAMYCIN) 0.1 % Apply 1 application topically as needed.    loratadine (CLARITIN) 10 MG tablet Take 1 tablet (10 mg total) by mouth daily.    metaxalone (SKELAXIN) 800 MG tablet Take 800 mg by mouth every 8 (eight) hours as needed for muscle spasms.     Multiple Vitamins-Minerals (CENTRUM SILVER PO) Take 1 tablet by mouth daily.    Multiple Vitamins-Minerals (PRESERVISION AREDS PO) Take 1 capsule by mouth 2 (two) times daily.    naproxen (NAPROSYN) 500 MG tablet Take 1 tablet by mouth 2 (two) times daily.    pantoprazole (PROTONIX) 40 MG tablet Take 1 tablet by mouth 2 (two) times daily.  phenytoin (DILANTIN) 100 MG ER capsule Take 300 mg by mouth at bedtime.     predniSONE (DELTASONE) 10 MG tablet Take 10 mg by mouth daily.    ranitidine (ZANTAC) 150 MG tablet Take 150 mg by mouth 2 (two) times daily.    sodium bicarbonate 650 MG tablet Take 1,300 mg by mouth 2 (two) times daily.      STOP taking these medications     diltiazem (CARDIZEM CD) 240 MG 24 hr capsule      doxycycline (VIBRA-TABS) 100 MG tablet      fentaNYL (DURAGESIC - DOSED MCG/HR) 12 MCG/HR      hydrochlorothiazide (HYDRODIURIL) 25 MG tablet      multivitamin (RENA-VIT) TABS tablet          DISCHARGE INSTRUCTIONS:    Follow with PMD in 1-2 weeks.  If you experience worsening of your admission symptoms, develop shortness of breath, life threatening emergency, suicidal or homicidal thoughts you must seek medical attention immediately by calling 911 or calling your MD immediately  if symptoms less severe.  You Must read complete instructions/literature along with all the possible adverse reactions/side effects for all the Medicines you take and that have been prescribed to you. Take any new Medicines after you have completely understood and accept all the possible  adverse reactions/side effects.   Please note  You were cared for by a hospitalist during your hospital stay. If you have any questions about your discharge medications or the care you received while you were in the hospital after you are discharged, you can call the unit and asked to speak with the hospitalist on call if the hospitalist that took care of you is not available. Once you are discharged, your primary care physician will handle any further medical issues. Please note that NO REFILLS for any discharge medications will be authorized once you are discharged, as it is imperative that you return to your primary care physician (or establish a relationship with a primary care physician if you do not have one) for your aftercare needs so that they can reassess your need for medications and monitor your lab values.    Today   CHIEF COMPLAINT:   Chief Complaint  Patient presents with  . Hypertension    HISTORY OF PRESENT ILLNESS:  Danice GoltzJacquelyn Dingley  is a 81 y.o. female presents with headache and significantly elevated blood pressure. Patient is here with her 2 daughters. Patient and family state that her blood pressure rose today to the 200s systolic, and despite taking some extra oral antihypertensives that was not coming down. She began to develop headache, and very mild shortness of breath, and decided to come in to the ED for evaluation and treatment. In the ED, blood pressure remains elevated with systolic in the 190s and 200s. On lab evaluation, her troponin was elevated to 0.11. Patient denies any chest pain at any point. Other workup in the ED including head CT is negative and/or stable. Hospitalists were called for accelerated hypertension and elevated troponin.   VITAL SIGNS:  Blood pressure 181/53, pulse 80, temperature 97.9 F (36.6 C), temperature source Oral, resp. rate 18, height 5\' 4"  (1.626 m), weight 91.4 kg (201 lb 8 oz), SpO2 97 %.  I/O:   Intake/Output Summary (Last  24 hours) at 05/22/15 1357 Last data filed at 05/22/15 0525  Gross per 24 hour  Intake    100 ml  Output    400 ml  Net   -300 ml  PHYSICAL EXAMINATION:   Constitutional: She is oriented to person, place, and time.  HENT:  Nose: No mucosal edema.  Mouth/Throat: No oropharyngeal exudate or posterior oropharyngeal edema.  Eyes: Conjunctivae, EOM and lids are normal. Pupils are equal, round, and reactive to light.  Neck: No JVD present. Carotid bruit is not present. No edema present. No thyroid mass and no thyromegaly present.  Cardiovascular: S1 normal and S2 normal. Exam reveals no gallop.  No murmur heard. Pulses:  Dorsalis pedis pulses are 2+ on the right side, and 2+ on the left side.  Respiratory: No respiratory distress. She has no wheezes. She has no rhonchi. She has no rales.  GI: Soft. Bowel sounds are normal. There is no tenderness.  Musculoskeletal: She exhibits no tenderness.   Left elbow: She exhibits no swelling.   Right wrist: She exhibits swelling.   Left wrist: She exhibits swelling.   Right ankle: She exhibits no swelling.   Left ankle: She exhibits no swelling.  Pain in the right sacroiliac area and over lumbar spine to palpation.  Lymphadenopathy:   She has no cervical adenopathy.  Neurological: She is alert and oriented to person, place, and time. No cranial nerve deficit.  Patient barely able to straight leg raise.  Skin: Skin is warm. Nails show no clubbing.  Chronic lower extremity discoloration bilaterally  Psychiatric: She has a normal mood and affect.   DATA REVIEW:   CBC  Recent Labs Lab 05/22/15 0526  WBC 6.1  HGB 7.6*  HCT 22.8*  PLT 303    Chemistries   Recent Labs Lab 05/22/15 0526  NA 135  K 3.3*  CL 95*  CO2 34*  GLUCOSE 98  BUN 13  CREATININE 1.24*  CALCIUM 9.0    Cardiac Enzymes No results for input(s): TROPONINI in the last 168 hours.  Microbiology Results  Results for orders placed  or performed during the hospital encounter of 05/07/15  MRSA PCR Screening     Status: Abnormal   Collection Time: 05/08/15  7:50 AM  Result Value Ref Range Status   MRSA by PCR POSITIVE (A) NEGATIVE Final    Comment:        The GeneXpert MRSA Assay (FDA approved for NASAL specimens only), is one component of a comprehensive MRSA colonization surveillance program. It is not intended to diagnose MRSA infection nor to guide or monitor treatment for MRSA infections. CRITICAL RESULT CALLED TO, READ BACK BY AND VERIFIED WITH: JESSICA CHRISTMAS ON 05/08/15 AT 1022 BY QSD     RADIOLOGY:  No results found.   Management plans discussed with the patient, family and they are in agreement.  CODE STATUS:     Code Status Orders        Start     Ordered   05/08/15 0514  Full code   Continuous     05/08/15 0513      TOTAL TIME TAKING CARE OF THIS PATIENT: 40 minutes.  I called her daughter on phone about the discharge plan.  Altamese Dilling M.D on 05/22/2015 at 1:57 PM  Between 7am to 6pm - Pager - [phone]  After 6pm go to [email or web address] - password [email or web address]  [facility]  Office  [phone]  CC: Primary care physician; Jaclyn Shaggy, MD   Note: This dictation was prepared with Dragon dictation along with smaller phrase technology. Any transcriptional errors that result from this process are unintentional.

## 2015-05-22 NOTE — Care Management Important Message (Signed)
Important Message  Patient Details  Name: Kathie RhodesJacquelyn R Heinlein MRN: [id number] Date of Birth: April 27, 1923   Medicare Important Message Given:  Yes-third notification given at approx 11:30a    Eber HongGreene, Cele Mote R, RN 05/22/2015, 5:04 PM

## 2015-05-22 NOTE — Clinical Social Work Placement (Signed)
   CLINICAL SOCIAL WORK PLACEMENT  NOTE  Date:  05/22/2015  Patient Details  Name: Kristina Wilson MRN: [id number] Date of Birth: 1922/10/20  Clinical Social Work is seeking post-discharge placement for this patient at the Skilled  Nursing Facility level of care (*CSW will initial, date and re-position this form in  chart as items are completed):  Yes   Patient/family provided with [facility] Clinical Social Work Department's list of facilities offering this level of care within the geographic area requested by the patient (or if unable, by the patient's family).  Yes   Patient/family informed of their freedom to choose among providers that offer the needed level of care, that participate in Medicare, Medicaid or managed care program needed by the patient, have an available bed and are willing to accept the patient.  Yes   Patient/family informed of [facility]'s ownership interest in [facility] and [facility], as well as of the fact that they are under no obligation to receive care at these facilities.  PASRR submitted to EDS on       PASRR number received on       Existing PASRR number confirmed on 05/10/15     FL2 transmitted to all facilities in geographic area requested by pt/family on 05/10/15     FL2 transmitted to all facilities within larger geographic area on       Patient informed that his/her managed care company has contracts with or will negotiate with certain facilities, including the following:        Yes   Patient/family informed of bed offers received.  Patient chooses bed at  [facility] )     Physician recommends and patient chooses bed at      Patient to be transferred to  [facility] ) on 05/22/15.  Patient to be transferred to facility by  [facility] )     Patient family notified on 05/22/15 of transfer.  Name of family member notified:   (Daughter Tresa EndoKelly is aware of D/C today. )     PHYSICIAN       Additional  Comment:    _______________________________________________ Haig ProphetMorgan, Ronalee Scheunemann G, LCSW 05/22/2015, 2:27 PM

## 2015-05-22 NOTE — Progress Notes (Signed)
Physical Therapy Treatment Patient Details Name: Kristina Wilson MRN: [id number] DOB: 04/16/1923 Today's Date: 05/22/2015    History of Present Illness Taiwana Willison is a 81 y.o. female with a known history of COPD, sleep apnea, CHF, chronic kidney disease, gastroesophageal reflux disease, chronic anemia, chronic back pain, episode of TIA in the past- has been gradually getting weak for last 1 month (recent hospitalization Aug 2016).  Presented to ER this admission secondary to HA and elevated BP; admitted for accelerated HTN and chest pain.  Mild elevation in troponin (peak 0.12), consistent with demand ischemia per MD.    PT Comments    Slight improvement in R hip/LE pain control this date; rating pain primarily in low back with transition to upright this date. Able to complete supine/sit transition (towards L) and maintain unsupported sitting balance with less physical assist from therapist this date (min/mod assist from therapist).  Did complete 2 repetitions of partial sit/stand with RW, though requiring max assist +2 and unable to achieve full upright stance.  Fatigues quickly with more challenging activities, tolerating partial standing only approx 10 seconds per trial. Requires return to supine end of session; unable to attempt transfer to chair due to pain/fatigue. Anticipate very slow, gradual progress towards all functional goals.   Follow Up Recommendations  SNF     Equipment Recommendations       Recommendations for Other Services       Precautions / Restrictions Precautions Precautions: Fall Precaution Comments: Contact isolation, R IJ permcath Restrictions Weight Bearing Restrictions: No    Mobility  Bed Mobility Overal bed mobility: Needs Assistance;+2 for physical assistance       Supine to sit: Mod assist Sit to supine: Max assist;+2 for physical assistance   General bed mobility comments: improving ability to assist with supine to sit (transition  towards L); continues to require extensive assist +2 for sit to supine (due to fatigue and pain after transition to upright)  Transfers Overall transfer level: Needs assistance Equipment used: Rolling walker (2 wheeled) Transfers: Sit to/from Stand Sit to Stand: Mod assist;Max assist;+2 physical assistance         General transfer comment: partial sit/stand from bed surface with RW; able to clear buttocks, but maintains nearly 80-90 degrees of forward trunk flexion. Unable to initiate additional extension of bilat hips or trunk secondary to pain/fatigue in low back.  Completed x2 trials, maintaining approx 10-15 seconds each trial; unable to incorporate additional functional activity with standing attempts.  Ambulation/Gait             General Gait Details: unable to tolerate secondary to significant pain with transition to upright   Stairs            Wheelchair Mobility    Modified Rankin (Stroke Patients Only)       Balance Overall balance assessment: Needs assistance;No apparent balance deficits (not formally assessed) Sitting-balance support: Bilateral upper extremity supported Sitting balance-Leahy Scale: Fair Sitting balance - Comments: requires bilat UE support at all times; continues with excessive weight shift to L, but improved from previous sessions.  Able to maintain with significantly less assist from therapist this date.   Standing balance support: Bilateral upper extremity supported Standing balance-Leahy Scale: Poor Standing balance comment: unable to achieve full upright stance                    Cognition Arousal/Alertness: Awake/alert Behavior During Therapy: WFL for tasks assessed/performed Overall Cognitive Status: Difficult to assess  Exercises Other Exercises Other Exercises: Supine LE therex, 1x12, AROM for muscular strength/endurance with functional activities Other Exercises: Unsupported sitting edge  of bed x5-6 minutes, min assist from therapist. Maintains L lateral weight shift (though improved from previous sessions) and very forward flexed, kyphotic posture.  Gradual improvement in position and overall tolerance of position.    General Comments        Pertinent Vitals/Pain Pain Assessment: Faces Faces Pain Scale: Hurts even more Pain Location: back with transition to upright Pain Descriptors / Indicators: Aching;Grimacing;Guarding Pain Intervention(s): Limited activity within patient's tolerance;Monitored during session;Repositioned    Home Living                      Prior Function            PT Goals (current goals can now be found in the care plan section) Acute Rehab PT Goals Patient Stated Goal: "I've got to lie back down" PT Goal Formulation: With patient/family Time For Goal Achievement: 05/23/15 Potential to Achieve Goals: Fair Progress towards PT goals: Progressing toward goals    Frequency  Min 2X/week    PT Plan Current plan remains appropriate    Co-evaluation             End of Session Equipment Utilized During Treatment: Gait belt Activity Tolerance: Patient limited by pain Patient left: in bed;with call bell/phone within reach;with bed alarm set;with family/visitor present     Time: 1024-1050 PT Time Calculation (min) (ACUTE ONLY): 26 min  Charges:  $Therapeutic Exercise: 8-22 mins $Therapeutic Activity: 8-22 mins                    G Codes:      Isaiahs Chancy H. Manson PasseyBrown, PT, DPT, NCS 05/22/2015, 11:05 AM [phone]

## 2015-05-22 NOTE — Progress Notes (Signed)
Patient has been discharged to [facility] as ordered,discharge instructions explained and well understood,hemodynamics within normal limits,report called to receiving facility,picked up by EMS.

## 2015-05-22 NOTE — Progress Notes (Signed)
Subjective:   Patient is doing fair today Family at bedside Ate breakfast. No Nausea or vomiting Rt arm swelling noted   Objective:  Vital signs in last 24 hours:  Temp:  [97.8 F (36.6 C)-98.3 F (36.8 C)] 98.2 F (36.8 C) (10/19 0525) Pulse Rate:  [66-84] 78 (10/19 0525) Resp:  [13-20] 20 (10/18 1947) BP: (120-192)/(35-69) 178/58 mmHg (10/19 0530) SpO2:  [93 %-100 %] 94 % (10/19 0525)  Weight change: -0.09 kg (-3.2 oz) Filed Weights   05/20/15 0411 05/21/15 0543 05/21/15 0938  Weight: 90.81 kg (200 lb 3.2 oz) 91.491 kg (201 lb 11.2 oz) 91.4 kg (201 lb 8 oz)    Intake/Output: I/O last 3 completed shifts: In: 100 [P.O.:100] Out: 550 [Urine:550]   Intake/Output this shift:     Physical Exam: General: NAD, frail , elderly  Head:  Moist oral mucosal membranes  Eyes: Anicteric  Neck: Supple, trachea midline  Lungs:  Mild scattered rhonchi, normal effort  Heart: Irregular, no rubs  Abdomen:  Soft, nontender, +peritoneal dialysis catheter  Extremities: trace peripheral edema., rt arm edema  Neurologic: Nonfocal, moving all four extremities  Skin: No acute lesions  Access: PD catheter, R IJ Permcath placed 05/16/15.    Basic Metabolic Panel:  Recent Labs Lab 05/16/15 0355 05/18/15 0407 05/22/15 0526  NA 133* 133* 135  K 3.6 3.6 3.3*  CL 92* 95* 95*  CO2 35* 31 34*  GLUCOSE 94 88 98  BUN CREATININE 1.68* 1.81* 1.24*  CALCIUM 9.0 8.9 9.0    Liver Function Tests:  Recent Labs Lab 05/21/15 0804  ALBUMIN 2.5*   No results for input(s): LIPASE, AMYLASE in the last 168 hours. No results for input(s): AMMONIA in the last 168 hours.  CBC:  Recent Labs Lab 05/16/15 0355 05/18/15 0407 05/22/15 0526  WBC 8.7 7.9 6.1  NEUTROABS 6.0  --   --   HGB 7.2* 7.6* 7.6*  HCT 21.6* 21.7* 22.8*  MCV 98.4 98.0 96.6  PLT 268 264 303    Cardiac Enzymes: No results for input(s): CKTOTAL, CKMB, CKMBINDEX, TROPONINI in the last 168  hours.  BNP: Invalid input(s): POCBNP  CBG:  Recent Labs Lab 05/17/15 0739  GLUCAP 81    Microbiology: Results for orders placed or performed during the hospital encounter of 05/07/15  MRSA PCR Screening     Status: Abnormal   Collection Time: 05/08/15  7:50 AM  Result Value Ref Range Status   MRSA by PCR POSITIVE (A) NEGATIVE Final    Comment:        The GeneXpert MRSA Assay (FDA approved for NASAL specimens only), is one component of a comprehensive MRSA colonization surveillance program. It is not intended to diagnose MRSA infection nor to guide or monitor treatment for MRSA infections. CRITICAL RESULT CALLED TO, READ BACK BY AND VERIFIED WITH: Kristina Wilson ON 05/08/15 AT 1022 BY QSD     Coagulation Studies: No results for input(s): LABPROT, INR in the last 72 hours.  Urinalysis: No results for input(s): COLORURINE, LABSPEC, PHURINE, GLUCOSEU, HGBUR, BILIRUBINUR, KETONESUR, PROTEINUR, UROBILINOGEN, NITRITE, LEUKOCYTESUR in the last 72 hours.  Invalid input(s): APPERANCEUR    Imaging: Dg Hip Unilat With Pelvis 2-3 Views Right  05/20/2015  CLINICAL DATA:  Right hip pain without reported injury. EXAM: DG HIP (WITH OR WITHOUT PELVIS) 2-3V RIGHT COMPARISON:  None. FINDINGS: There is no evidence of hip fracture or dislocation. There is no evidence of arthropathy or other focal bone abnormality. IMPRESSION: Normal  right hip. Electronically Signed   By: Lupita RaiderJames  Green Jr, M.D.   On: 05/20/2015 12:31     Medications:     . amLODipine  10 mg Oral QPC supper  . aspirin EC  81 mg Oral Daily  . bimatoprost  1 drop Both Eyes BID  . carvedilol  25 mg Oral QHS  . cloNIDine  0.3 mg Oral TID  . docusate sodium  100 mg Oral BID  . epoetin (EPOGEN/PROCRIT) injection  10,000 Units Intravenous Q T,Th,Sa-HD  . feeding supplement (NEPRO CARB STEADY)  237 mL Oral BID BM  . fentaNYL  25 mcg Transdermal Q72H  . fluticasone  2 spray Each Nare Daily  . furosemide  40 mg Oral  Daily  . gentamicin cream  1 application Topical Daily  . heparin subcutaneous  5,000 Units Subcutaneous 3 times per day  . hydrALAZINE  100 mg Oral TID  . iron polysaccharides  150 mg Oral Daily  . lidocaine  2 patch Transdermal Q24H  . nitroGLYCERIN  0.5 inch Topical 4 times per day  . pantoprazole  40 mg Oral BID  . phenytoin  300 mg Oral QHS  . predniSONE  10 mg Oral Q breakfast  . sodium chloride  3 mL Intravenous Q12H   acetaminophen, diphenhydrAMINE, hydrALAZINE, labetalol, lactulose, nitroGLYCERIN, ondansetron (ZOFRAN) IV, oxyCODONE, promethazine  Assessment/ Plan:  [name] with past medical history of hypertension, obstructive sleep apnea, DJD, CVA with residual left sided weakness, carotid stenosis status post L CEA 2/15, seizure disorder, diastolic congestive heart failure, anemia  1. End Stage Renal Disease with proteinuria: progression of disease from Chronic kidney disease stage V. 24 hour creatinine clearance 12. Initiated on hemodialysis on 10/8 through right femoral temp HD catheter.  Now has rt IJ permcath  2. Hypertension. Has history of labile blood pressure.Very difficult time treating hypertension and avoiding side effects.  Blood pressures very labile.   -  continue amlodipine, clonidine, coreg, and hydralazine.  - change meds to be taken at night to avoid hypotension during dialysis -  Enalapril causes ANAPHYLAXIS (confirmed from daughter)  3. Secondary hyperparathyroidism. Would continue to periodically monitor phos, phos well controlled at last check.  4. Anemia chronic kidney disease: hgb was 7.6 at last check, continue epogen IV with HD.   Discussed with family (daughter)  5. Dispo: depends on outpatient spot availability   LOS: 13  Kristina Wilson 10/19/201610:32 AM

## 2015-05-22 NOTE — Progress Notes (Signed)
Patient is medically stable for D/C to [facility]. RN will call report to 500 hall and arrange EMS for transport. Clinical Child psychotherapistocial Worker (CSW) prepared D/C packet and [facility]. CSW faxed D/C Summary to patient's dialysis center [facility] in [place]. Patient is aware of above. CSW contacted patient's daughter Tresa EndoKelly and made her aware of above. Please reconsult if future social work needs arise. CSW signing off.   Jetta LoutBailey Morgan, LCSWA [phone]

## 2015-05-29 ENCOUNTER — Emergency Department: Payer: Medicare Other

## 2015-05-29 ENCOUNTER — Inpatient Hospital Stay
Admission: EM | Admit: 2015-05-29 | Discharge: 2015-06-06 | DRG: 291 | Disposition: A | Discharge status: Skilled Nursing Facility | Payer: Medicare Other | Attending: Internal Medicine | Admitting: Internal Medicine

## 2015-05-29 DIAGNOSIS — I5033 Acute on chronic diastolic (congestive) heart failure: Principal | ICD-10-CM | POA: Diagnosis present

## 2015-05-29 DIAGNOSIS — G40909 Epilepsy, unspecified, not intractable, without status epilepticus: Secondary | ICD-10-CM | POA: Diagnosis present

## 2015-05-29 DIAGNOSIS — G629 Polyneuropathy, unspecified: Secondary | ICD-10-CM | POA: Diagnosis present

## 2015-05-29 DIAGNOSIS — I214 Non-ST elevation (NSTEMI) myocardial infarction: Secondary | ICD-10-CM

## 2015-05-29 DIAGNOSIS — N2581 Secondary hyperparathyroidism of renal origin: Secondary | ICD-10-CM | POA: Diagnosis present

## 2015-05-29 DIAGNOSIS — Z7952 Long term (current) use of systemic steroids: Secondary | ICD-10-CM | POA: Diagnosis not present

## 2015-05-29 DIAGNOSIS — G8929 Other chronic pain: Secondary | ICD-10-CM | POA: Diagnosis present

## 2015-05-29 DIAGNOSIS — D638 Anemia in other chronic diseases classified elsewhere: Secondary | ICD-10-CM | POA: Diagnosis present

## 2015-05-29 DIAGNOSIS — Z823 Family history of stroke: Secondary | ICD-10-CM | POA: Diagnosis not present

## 2015-05-29 DIAGNOSIS — Z9889 Other specified postprocedural states: Secondary | ICD-10-CM

## 2015-05-29 DIAGNOSIS — G9340 Encephalopathy, unspecified: Secondary | ICD-10-CM | POA: Diagnosis present

## 2015-05-29 DIAGNOSIS — R809 Proteinuria, unspecified: Secondary | ICD-10-CM | POA: Diagnosis present

## 2015-05-29 DIAGNOSIS — Z9049 Acquired absence of other specified parts of digestive tract: Secondary | ICD-10-CM

## 2015-05-29 DIAGNOSIS — Z7982 Long term (current) use of aspirin: Secondary | ICD-10-CM | POA: Diagnosis not present

## 2015-05-29 DIAGNOSIS — K219 Gastro-esophageal reflux disease without esophagitis: Secondary | ICD-10-CM | POA: Diagnosis present

## 2015-05-29 DIAGNOSIS — H409 Unspecified glaucoma: Secondary | ICD-10-CM | POA: Diagnosis present

## 2015-05-29 DIAGNOSIS — Z79899 Other long term (current) drug therapy: Secondary | ICD-10-CM | POA: Diagnosis not present

## 2015-05-29 DIAGNOSIS — J441 Chronic obstructive pulmonary disease with (acute) exacerbation: Secondary | ICD-10-CM | POA: Diagnosis present

## 2015-05-29 DIAGNOSIS — J9621 Acute and chronic respiratory failure with hypoxia: Secondary | ICD-10-CM | POA: Diagnosis present

## 2015-05-29 DIAGNOSIS — Z888 Allergy status to other drugs, medicaments and biological substances status: Secondary | ICD-10-CM | POA: Diagnosis not present

## 2015-05-29 DIAGNOSIS — Z87891 Personal history of nicotine dependence: Secondary | ICD-10-CM | POA: Diagnosis not present

## 2015-05-29 DIAGNOSIS — Z96653 Presence of artificial knee joint, bilateral: Secondary | ICD-10-CM | POA: Diagnosis present

## 2015-05-29 DIAGNOSIS — R0602 Shortness of breath: Secondary | ICD-10-CM

## 2015-05-29 DIAGNOSIS — N3001 Acute cystitis with hematuria: Secondary | ICD-10-CM | POA: Diagnosis present

## 2015-05-29 DIAGNOSIS — Z807 Family history of other malignant neoplasms of lymphoid, hematopoietic and related tissues: Secondary | ICD-10-CM | POA: Diagnosis not present

## 2015-05-29 DIAGNOSIS — G4733 Obstructive sleep apnea (adult) (pediatric): Secondary | ICD-10-CM | POA: Diagnosis present

## 2015-05-29 DIAGNOSIS — M545 Low back pain: Secondary | ICD-10-CM | POA: Diagnosis present

## 2015-05-29 DIAGNOSIS — N289 Disorder of kidney and ureter, unspecified: Secondary | ICD-10-CM

## 2015-05-29 DIAGNOSIS — M1990 Unspecified osteoarthritis, unspecified site: Secondary | ICD-10-CM | POA: Diagnosis present

## 2015-05-29 DIAGNOSIS — R0689 Other abnormalities of breathing: Secondary | ICD-10-CM

## 2015-05-29 DIAGNOSIS — Z992 Dependence on renal dialysis: Secondary | ICD-10-CM | POA: Diagnosis not present

## 2015-05-29 DIAGNOSIS — I69354 Hemiplegia and hemiparesis following cerebral infarction affecting left non-dominant side: Secondary | ICD-10-CM

## 2015-05-29 DIAGNOSIS — J9622 Acute and chronic respiratory failure with hypercapnia: Secondary | ICD-10-CM | POA: Diagnosis present

## 2015-05-29 DIAGNOSIS — I132 Hypertensive heart and chronic kidney disease with heart failure and with stage 5 chronic kidney disease, or end stage renal disease: Secondary | ICD-10-CM | POA: Diagnosis present

## 2015-05-29 DIAGNOSIS — E8809 Other disorders of plasma-protein metabolism, not elsewhere classified: Secondary | ICD-10-CM | POA: Diagnosis present

## 2015-05-29 DIAGNOSIS — N189 Chronic kidney disease, unspecified: Secondary | ICD-10-CM

## 2015-05-29 DIAGNOSIS — I509 Heart failure, unspecified: Secondary | ICD-10-CM

## 2015-05-29 DIAGNOSIS — N186 End stage renal disease: Secondary | ICD-10-CM | POA: Diagnosis present

## 2015-05-29 DIAGNOSIS — Z886 Allergy status to analgesic agent status: Secondary | ICD-10-CM | POA: Diagnosis not present

## 2015-05-29 DIAGNOSIS — R4182 Altered mental status, unspecified: Secondary | ICD-10-CM | POA: Diagnosis present

## 2015-05-29 DIAGNOSIS — Z8249 Family history of ischemic heart disease and other diseases of the circulatory system: Secondary | ICD-10-CM

## 2015-05-29 LAB — BLOOD GAS, VENOUS
Acid-Base Excess: 5.6 mmol/L — ABNORMAL HIGH (ref 0.0–3.0)
BICARBONATE: 34.2 meq/L — AB (ref 21.0–28.0)
PATIENT TEMPERATURE: 37
PATIENT TEMPERATURE: 37
PCO2 VEN: 68 mmHg — AB (ref 44.0–60.0)
PH VEN: 7.33 (ref 7.320–7.430)
pCO2, Ven: 64 mmHg — ABNORMAL HIGH (ref 44.0–60.0)
pH, Ven: 7.31 — ABNORMAL LOW (ref 7.320–7.430)

## 2015-05-29 LAB — URINALYSIS COMPLETE WITH MICROSCOPIC (ARMC ONLY)
BILIRUBIN URINE: NEGATIVE
GLUCOSE, UA: 50 mg/dL — AB
HGB URINE DIPSTICK: NEGATIVE
KETONES UR: NEGATIVE mg/dL
Nitrite: NEGATIVE
Protein, ur: 100 mg/dL — AB
Specific Gravity, Urine: 1.021 (ref 1.005–1.030)
pH: 5 (ref 5.0–8.0)

## 2015-05-29 LAB — COMPREHENSIVE METABOLIC PANEL
ALBUMIN: 2.9 g/dL — AB (ref 3.5–5.0)
ALK PHOS: 96 U/L (ref 38–126)
ALT: 10 U/L — ABNORMAL LOW (ref 14–54)
ANION GAP: 12 (ref 5–15)
AST: 38 U/L (ref 15–41)
BILIRUBIN TOTAL: 1 mg/dL (ref 0.3–1.2)
BUN: 10 mg/dL (ref 6–20)
CALCIUM: 8.9 mg/dL (ref 8.9–10.3)
CO2: 27 mmol/L (ref 22–32)
Chloride: 94 mmol/L — ABNORMAL LOW (ref 101–111)
Creatinine, Ser: 1.75 mg/dL — ABNORMAL HIGH (ref 0.44–1.00)
GFR, EST AFRICAN AMERICAN: 28 mL/min — AB (ref >60)
GFR, EST NON AFRICAN AMERICAN: 24 mL/min — AB (ref >60)
GLUCOSE: 106 mg/dL — AB (ref 65–99)
POTASSIUM: 4.7 mmol/L (ref 3.5–5.1)
Sodium: 133 mmol/L — ABNORMAL LOW (ref 135–145)
TOTAL PROTEIN: 6.1 g/dL — AB (ref 6.5–8.1)

## 2015-05-29 LAB — PROTIME-INR
INR: 1.04
Prothrombin Time: 13.8 seconds (ref 11.4–15.0)

## 2015-05-29 LAB — CBC
HEMATOCRIT: 26.8 % — AB (ref 35.0–47.0)
HEMOGLOBIN: 8.8 g/dL — AB (ref 12.0–16.0)
MCH: 31.5 pg (ref 26.0–34.0)
MCHC: 32.9 g/dL (ref 32.0–36.0)
MCV: 95.8 fL (ref 80.0–100.0)
Platelets: 361 10*3/uL (ref 150–440)
RBC: 2.8 MIL/uL — ABNORMAL LOW (ref 3.80–5.20)
RDW: 13.5 % (ref 11.5–14.5)
WBC: 9.3 10*3/uL (ref 3.6–11.0)

## 2015-05-29 LAB — APTT: aPTT: 31 seconds (ref 24–36)

## 2015-05-29 LAB — TROPONIN I: Troponin I: 0.05 ng/mL — ABNORMAL HIGH (ref <0.031)

## 2015-05-29 LAB — BRAIN NATRIURETIC PEPTIDE: B Natriuretic Peptide: 767 pg/mL — ABNORMAL HIGH (ref 0.0–100.0)

## 2015-05-29 MED ORDER — PREDNISONE 10 MG PO TABS
10.0000 mg | ORAL_TABLET | Freq: Every day | ORAL | Status: DC
  Administered 2015-05-30 – 2015-06-02 (×4): 10 mg via ORAL
  Filled 2015-05-29 (×4): qty 1

## 2015-05-29 MED ORDER — OCUVITE-LUTEIN PO CAPS
1.0000 | ORAL_CAPSULE | Freq: Two times a day (BID) | ORAL | Status: DC
  Administered 2015-05-29 – 2015-06-06 (×15): 1 via ORAL
  Filled 2015-05-29 (×15): qty 1

## 2015-05-29 MED ORDER — FLUTICASONE PROPIONATE 50 MCG/ACT NA SUSP
2.0000 | Freq: Every day | NASAL | Status: DC
Start: 2015-05-30 — End: 2015-06-06
  Administered 2015-06-01 – 2015-06-06 (×4): 2 via NASAL
  Filled 2015-05-29: qty 16

## 2015-05-29 MED ORDER — BISACODYL 5 MG PO TBEC
5.0000 mg | DELAYED_RELEASE_TABLET | Freq: Every day | ORAL | Status: DC | PRN
  Filled 2015-05-29: qty 1

## 2015-05-29 MED ORDER — OXYCODONE HCL 5 MG PO TABS: 5.0000 mg | ORAL_TABLET | Freq: Three times a day (TID) | ORAL | Status: DC | PRN

## 2015-05-29 MED ORDER — CARVEDILOL 12.5 MG PO TABS
12.5000 mg | ORAL_TABLET | Freq: Two times a day (BID) | ORAL | Status: DC
  Administered 2015-05-29 – 2015-06-06 (×14): 12.5 mg via ORAL
  Filled 2015-05-29 (×15): qty 1

## 2015-05-29 MED ORDER — PROMETHAZINE HCL 25 MG PO TABS: 25.0000 mg | ORAL_TABLET | ORAL | Status: DC | PRN

## 2015-05-29 MED ORDER — POLYSACCHARIDE IRON COMPLEX 150 MG PO CAPS
150.0000 mg | ORAL_CAPSULE | Freq: Every day | ORAL | Status: DC
  Administered 2015-05-30 – 2015-06-06 (×8): 150 mg via ORAL
  Filled 2015-05-29 (×8): qty 1

## 2015-05-29 MED ORDER — HEPARIN SODIUM (PORCINE) 5000 UNIT/ML IJ SOLN
5000.0000 [IU] | Freq: Three times a day (TID) | INTRAMUSCULAR | Status: DC
  Administered 2015-05-29 – 2015-06-05 (×19): 5000 [IU] via SUBCUTANEOUS
  Filled 2015-05-29 (×19): qty 1

## 2015-05-29 MED ORDER — ADULT MULTIVITAMIN W/MINERALS CH
1.0000 | ORAL_TABLET | Freq: Every day | ORAL | Status: DC
  Administered 2015-05-30 – 2015-06-06 (×8): 1 via ORAL
  Filled 2015-05-29 (×9): qty 1

## 2015-05-29 MED ORDER — SODIUM BICARBONATE 650 MG PO TABS
1300.0000 mg | ORAL_TABLET | Freq: Two times a day (BID) | ORAL | Status: DC
  Administered 2015-05-29 – 2015-06-01 (×4): 1300 mg via ORAL
  Filled 2015-05-29 (×5): qty 2

## 2015-05-29 MED ORDER — LORATADINE 10 MG PO TABS
10.0000 mg | ORAL_TABLET | Freq: Every day | ORAL | Status: DC
  Administered 2015-05-30 – 2015-06-06 (×8): 10 mg via ORAL
  Filled 2015-05-29 (×8): qty 1

## 2015-05-29 MED ORDER — CYANOCOBALAMIN 500 MCG PO TABS
500.0000 ug | ORAL_TABLET | Freq: Every day | ORAL | Status: DC
  Administered 2015-05-30 – 2015-06-06 (×8): 500 ug via ORAL
  Filled 2015-05-29 (×8): qty 1

## 2015-05-29 MED ORDER — FAMOTIDINE 20 MG PO TABS
10.0000 mg | ORAL_TABLET | Freq: Every day | ORAL | Status: DC
  Administered 2015-05-30 – 2015-06-05 (×7): 10 mg via ORAL
  Filled 2015-05-29 (×7): qty 1

## 2015-05-29 MED ORDER — HYDROCODONE-ACETAMINOPHEN 5-325 MG PO TABS: 1.0000 | ORAL_TABLET | ORAL | Status: DC | PRN

## 2015-05-29 MED ORDER — FUROSEMIDE 10 MG/ML IJ SOLN
40.0000 mg | Freq: Once | INTRAMUSCULAR | Status: AC
  Administered 2015-05-29: 40 mg via INTRAVENOUS
  Filled 2015-05-29: qty 4

## 2015-05-29 MED ORDER — LIDOCAINE 5 % EX PTCH
1.0000 | MEDICATED_PATCH | CUTANEOUS | Status: DC
  Administered 2015-05-30 – 2015-06-05 (×7): 1 via TRANSDERMAL
  Filled 2015-05-29 (×9): qty 1

## 2015-05-29 MED ORDER — COQ10 50 MG PO CAPS: 50.0000 mg | ORAL_CAPSULE | Freq: Every day | ORAL | Status: DC

## 2015-05-29 MED ORDER — LATANOPROST 0.005 % OP SOLN
1.0000 [drp] | Freq: Every day | OPHTHALMIC | Status: DC
  Administered 2015-05-29 – 2015-06-05 (×8): 1 [drp] via OPHTHALMIC
  Filled 2015-05-29: qty 2.5

## 2015-05-29 MED ORDER — DOCUSATE SODIUM 100 MG PO CAPS
100.0000 mg | ORAL_CAPSULE | Freq: Two times a day (BID) | ORAL | Status: DC
  Administered 2015-05-29 – 2015-06-06 (×15): 100 mg via ORAL
  Filled 2015-05-29 (×16): qty 1

## 2015-05-29 MED ORDER — AMLODIPINE BESYLATE 10 MG PO TABS
10.0000 mg | ORAL_TABLET | Freq: Every day | ORAL | Status: DC
  Administered 2015-05-29 – 2015-06-05 (×8): 10 mg via ORAL
  Filled 2015-05-29 (×8): qty 1

## 2015-05-29 MED ORDER — ALUM & MAG HYDROXIDE-SIMETH 200-200-20 MG/5ML PO SUSP: 30.0000 mL | Freq: Four times a day (QID) | ORAL | Status: DC | PRN

## 2015-05-29 MED ORDER — PHENYTOIN SODIUM EXTENDED 100 MG PO CAPS
300.0000 mg | ORAL_CAPSULE | Freq: Every day | ORAL | Status: DC
  Administered 2015-05-29 – 2015-06-05 (×8): 300 mg via ORAL
  Filled 2015-05-29 (×9): qty 3

## 2015-05-29 MED ORDER — DEXTROSE 5 % IV SOLN
1.0000 g | Freq: Two times a day (BID) | INTRAVENOUS | Status: DC
  Administered 2015-05-29 – 2015-06-02 (×8): 1 g via INTRAVENOUS
  Filled 2015-05-29 (×13): qty 10

## 2015-05-29 MED ORDER — FUROSEMIDE 40 MG PO TABS
40.0000 mg | ORAL_TABLET | Freq: Every day | ORAL | Status: DC
  Administered 2015-05-30 – 2015-06-02 (×4): 40 mg via ORAL
  Filled 2015-05-29 (×4): qty 1

## 2015-05-29 MED ORDER — ONDANSETRON HCL 4 MG/2ML IJ SOLN: 4.0000 mg | Freq: Four times a day (QID) | INTRAMUSCULAR | Status: DC | PRN

## 2015-05-29 MED ORDER — GABAPENTIN 100 MG PO CAPS
200.0000 mg | ORAL_CAPSULE | Freq: Three times a day (TID) | ORAL | Status: DC
  Administered 2015-05-29 – 2015-06-01 (×6): 200 mg via ORAL
  Filled 2015-05-29 (×3): qty 2
  Filled 2015-05-29: qty 1
  Filled 2015-05-29 (×2): qty 2

## 2015-05-29 MED ORDER — PANTOPRAZOLE SODIUM 40 MG PO TBEC
40.0000 mg | DELAYED_RELEASE_TABLET | Freq: Two times a day (BID) | ORAL | Status: DC
  Administered 2015-05-29 – 2015-06-06 (×15): 40 mg via ORAL
  Filled 2015-05-29 (×17): qty 1

## 2015-05-29 MED ORDER — ASPIRIN EC 81 MG PO TBEC
81.0000 mg | DELAYED_RELEASE_TABLET | Freq: Every day | ORAL | Status: DC
  Administered 2015-05-30 – 2015-06-06 (×8): 81 mg via ORAL
  Filled 2015-05-29 (×8): qty 1

## 2015-05-29 MED ORDER — ONDANSETRON HCL 4 MG PO TABS: 4.0000 mg | ORAL_TABLET | Freq: Four times a day (QID) | ORAL | Status: DC | PRN

## 2015-05-29 NOTE — ED Notes (Signed)
[facility] presents to [facility] with lethargy and "repeating everything" per family members. Family states this is not pt's baseline.

## 2015-05-29 NOTE — ED Notes (Signed)
When pt first came in, pt's O2 sat was reading 49% on RA. Pt was put on a non-rebreather at 100% O2. Pt responded well. Dr. Sharma CovertNorman saw pt and determined pt's O2 was okay and put pt on nasal cannula on 2L. Pt is normally on 2L nasal cannula per family since hospitalization 1 month ago.

## 2015-05-29 NOTE — Care Management (Signed)
Patient with recent discharge [facility] to [facility].  She returns with altered mental status and 02 sats that were 49% on room air upon arrival to the ED.  Updated care team that patient should SIT UP IN A CHAIR for her dialysis treatments unless there is a medical contraindication. Referral to CSW

## 2015-05-29 NOTE — ED Notes (Signed)
Family states that swelling in right hand is normal, but not to this extent. Swelling in left hand is not normal. Swelling to legs and feet bilaterally, family states not normal either. Pt went to dialysis yesterday.

## 2015-05-29 NOTE — Progress Notes (Signed)

## 2015-05-29 NOTE — ED Provider Notes (Signed)
[facility] Emergency Department Provider Note  ____________________________________________  Time seen: Approximately 1:07 PM  I have reviewed the triage vital signs and the nursing notes.   HISTORY  Chief Complaint Altered Mental Status  Limited due to patient altered mental status.  HPI Kristina Wilson is a 81 y.o. female who has been at [facility] for 2 weeks presenting with acute onset of altered mental status. Per the family, she is normally alert and oriented at baseline, "sharp as a tack."  This morning, she was her normal self at breakfast, and then became altered sometime between 9:30 AM and 11 AM. The daughter and granddaughter describes that she was answering questions "softly" and repeating herself. They felt that she was lethargic. There is no history of recent cough, cold, fever, chills, urinary symptoms, nausea or vomiting, diarrhea, chest pain, or shortness of breath. There is no known history of trauma. The patient denies any pain at this time.The patient was recently hospitalized and had multiple medication changes with her antihypertensives. She is also supposed to be on 2 L nasal cannula oxygen and has arrived here without any oxygen.   Past Medical History  Diagnosis Date  . Muscle weakness   . Chronic respiratory failure ([place])   . Pneumonia   . Sleep apnea   . CHF (congestive heart failure) ([place])   . CKD (chronic kidney disease), stage IV ([place])   . Dysphagia   . GERD (gastroesophageal reflux disease)   . Back pain   . Epilepsy ([place])   . Hypertension   . Stroke ([place])   . Headache   . Arthritis   . Anemia     iron deficiency anemia  . Complication of anesthesia     difficult waking up after surgery (2013)    Patient Active Problem List   Diagnosis Date Noted  . Pressure ulcer 05/15/2015  . Accelerated hypertension 05/08/2015  . Elevated troponin 05/08/2015  . GERD (gastroesophageal reflux disease) 05/08/2015  .  Epilepsy ([place]) 05/08/2015  . Chronic CHF (congestive heart failure) ([place]) 05/08/2015  . UTI (urinary tract infection) 03/18/2015  . Acute sinusitis 03/18/2015  . Weakness of both lower limbs 03/15/2015  . GI bleed 12/27/2014  . Hypotension 12/27/2014  . Anemia of chronic disease 12/27/2014  . Acute posthemorrhagic anemia 12/27/2014  . CKD (chronic kidney disease), stage IV ([place]) 12/27/2014  . Cellulitis 12/27/2014    Past Surgical History  Procedure Laterality Date  . Back surgery    . Knee surgery    . Carotid endarterectomy    . Tonsillectomy    . Appendectomy    . Joint replacement Bilateral     Knee Replacements  . Dilation and curettage of uterus    . Breast surgery Right     Breast Biopsy  . Capd insertion N/A 04/12/2015    Procedure: LAPAROSCOPIC INSERTION CONTINUOUS AMBULATORY PERITONEAL DIALYSIS  (CAPD) CATHETER;  Surgeon: Katha Cabal, MD;  Location: [facility] ORS;  Service: Vascular;  Laterality: N/A;  . Peripheral vascular catheterization N/A 05/16/2015    Procedure: Dialysis/Perma Catheter Insertion;  Surgeon: Algernon Huxley, MD;  Location: [place] CV LAB;  Service: Cardiovascular;  Laterality: N/A;    Current Outpatient Rx  Name  Route  Sig  Dispense  Refill  . acetaminophen (TYLENOL) 325 MG tablet   Oral   Take 650 mg by mouth every 4 (four) hours as needed for mild pain, fever or headache.          Marland Kitchen  amLODipine (NORVASC) 10 MG tablet   Oral   Take 1 tablet (10 mg total) by mouth daily after supper.   30 tablet   0   . aspirin EC 81 MG EC tablet   Oral   Take 1 tablet (81 mg total) by mouth daily.   30 tablet   0   . bimatoprost (LUMIGAN) 0.01 % SOLN   Both Eyes   Place 1 drop into both eyes 2 (two) times daily.   5 mL   1   . carvedilol (COREG) 25 MG tablet   Oral   Take 1 tablet (25 mg total) by mouth at bedtime.   30 tablet   0   . cloNIDine (CATAPRES) 0.3 MG tablet   Oral   Take 1 tablet (0.3 mg total) by mouth 3 (three) times  daily.         . Coenzyme Q10 (CO Q 10 PO)   Oral   Take 1 tablet by mouth daily.         . Cyanocobalamin (VITAMIN B 12 PO)   Oral   Take 1 capsule by mouth daily.         Marland Kitchen docusate sodium (COLACE) 100 MG capsule   Oral   Take 100 mg by mouth every other day.         . fentaNYL (DURAGESIC - DOSED MCG/HR) 25 MCG/HR patch   Transdermal   Place 1 patch (25 mcg total) onto the skin every 3 (three) days.   5 patch   0   . fluticasone (FLONASE) 50 MCG/ACT nasal spray   Each Nare   Place 2 sprays into both nostrils daily.      2   . furosemide (LASIX) 40 MG tablet   Oral   Take 40 mg by mouth daily.         Marland Kitchen gabapentin (NEURONTIN) 100 MG capsule   Oral   Take 100 mg by mouth 3 (three) times daily.         Marland Kitchen gentamicin cream (GARAMYCIN) 0.1 %   Topical   Apply 1 application topically as needed.         . hydrALAZINE (APRESOLINE) 100 MG tablet   Oral   Take 1 tablet (100 mg total) by mouth 3 (three) times daily.   90 tablet   0   . iron polysaccharides (NIFEREX) 150 MG capsule   Oral   Take 1 capsule (150 mg total) by mouth daily.   30 capsule   0   . lactulose (CHRONULAC) 10 GM/15ML solution   Oral   Take 30 mLs (20 g total) by mouth daily as needed for mild constipation.   240 mL   0   . lidocaine (LIDODERM) 5 %   Transdermal   Place 2 patches onto the skin daily. Remove & Discard patch within 12 hours or as directed by MD   30 patch   0     One on right shoulder and back of neck part and se ...   . loratadine (CLARITIN) 10 MG tablet   Oral   Take 1 tablet (10 mg total) by mouth daily.         . metaxalone (SKELAXIN) 800 MG tablet   Oral   Take 800 mg by mouth every 8 (eight) hours as needed for muscle spasms.          . Multiple Vitamins-Minerals (CENTRUM SILVER PO)   Oral   Take 1 tablet  by mouth daily.         . Multiple Vitamins-Minerals (PRESERVISION AREDS PO)   Oral   Take 1 capsule by mouth 2 (two) times daily.          . naproxen (NAPROSYN) 500 MG tablet   Oral   Take 1 tablet by mouth 2 (two) times daily.         . nitroGLYCERIN (NITROSTAT) 0.4 MG SL tablet   Sublingual   Place 1 tablet (0.4 mg total) under the tongue every 5 (five) minutes as needed for chest pain.   20 tablet   0   . oxyCODONE (OXY IR/ROXICODONE) 5 MG immediate release tablet   Oral   Take 1 tablet (5 mg total) by mouth 3 (three) times daily as needed for moderate pain or breakthrough pain.   30 tablet   0   . pantoprazole (PROTONIX) 40 MG tablet   Oral   Take 1 tablet by mouth 2 (two) times daily.         . phenytoin (DILANTIN) 100 MG ER capsule   Oral   Take 300 mg by mouth at bedtime.          . predniSONE (DELTASONE) 10 MG tablet   Oral   Take 10 mg by mouth daily.         . ranitidine (ZANTAC) 150 MG tablet   Oral   Take 150 mg by mouth 2 (two) times daily.         . sodium bicarbonate 650 MG tablet   Oral   Take 1,300 mg by mouth 2 (two) times daily.           Allergies Enalapril; Tramadol; Capsaicin; Codeine; Hydrocodone; and Vicodin  Family History  Problem Relation Age of Onset  . CAD Mother   . CAD Father   . Anemia Father   . CVA Sister   . Multiple myeloma Brother     Social History Social History  Substance Use Topics  . Smoking status: Former Smoker -- 2.00 packs/day for 20 years    Types: Cigarettes    Quit date: 06/04/1967  . Smokeless tobacco: Never Used  . Alcohol Use: No    Review of Systems Constitutional: No fever/chills. No syncope or lightheadedness. Generalized weakness. Eyes: No visual changes. ENT: No sore throat. No rhinorrhea, cough or cold symptoms. Cardiovascular: Denies chest pain, palpitations. Respiratory: Denies shortness of breath.  No cough. Gastrointestinal: No abdominal pain.  No nausea, no vomiting.  No diarrhea.  No constipation. Genitourinary: Negative for dysuria. Musculoskeletal: Negative for back pain. Skin: Negative for  rash. Neurological: Negative for headaches, focal weakness or numbness. Positive for altered mental status.  10-point ROS otherwise negative.  ____________________________________________   PHYSICAL EXAM:  VITAL SIGNS: ED Triage Vitals  Enc Vitals Group     BP 05/29/15 1248 145/26 mmHg     Pulse Rate 05/29/15 1248 86     Resp 05/29/15 1248 18     Temp 05/29/15 1248 98 F (36.7 C)     Temp Source 05/29/15 1248 Oral     SpO2 05/29/15 1248 98 %     Weight --      Height --      Head Cir --      Peak Flow --      Pain Score --      Pain Loc --      Pain Edu? --      Excl. in [place]? --  Constitutional: The patient is alert and oriented to year and month but not the date. She does know she is in the hospital and does know her name. She does not have insight as to why she is here. Eyes: Conjunctivae are normal.  EOMI. Head: Atraumatic. No raccoon eyes or Battle sign. Nose: No congestion/rhinnorhea. Mouth/Throat: Mucous membranes are moist.  Neck: No stridor.  Supple.  No meningismus. Cardiovascular: Normal rate, regular rhythm. No murmurs, rubs or gallops.  Respiratory: Normal respiratory effort.  No retractions. Lungs CTAB.  No wheezes, rales or ronchi. Gastrointestinal: Obese. Soft and nontender. No distention. No peritoneal signs. Musculoskeletal: Bilateral symmetric lower extremity edema to the knees. Neurologic: Alert and oriented to everything except for the date. Speech is clear. Face and smile symmetric.  Patient refuses to give me a full motor exam. She does have sensation to noxious stimulus in all 4 extremities and does withdraw with pinching of both legs and both arms.  Skin:  Skin is warm, dry and intact. No rash noted. Psychiatric: Mood and affect are normal.   ____________________________________________   LABS (all labs ordered are listed, but only abnormal results are displayed)  Labs Reviewed  CBC - Abnormal; Notable for the following:    RBC 2.80 (*)     Hemoglobin 8.8 (*)    HCT 26.8 (*)    All other components within normal limits  COMPREHENSIVE METABOLIC PANEL - Abnormal; Notable for the following:    Sodium 133 (*)    Chloride 94 (*)    Glucose, Bld 106 (*)    Creatinine, Ser 1.75 (*)    Total Protein 6.1 (*)    Albumin 2.9 (*)    ALT 10 (*)    GFR calc non Af Amer 24 (*)    GFR calc Af Amer 28 (*)    All other components within normal limits  BLOOD GAS, VENOUS - Abnormal; Notable for the following:    pH, Ven 7.31 (*)    pCO2, Ven 68 (*)    Bicarbonate 34.2 (*)    Acid-Base Excess 5.6 (*)    All other components within normal limits  TROPONIN I - Abnormal; Notable for the following:    Troponin I 0.05 (*)    All other components within normal limits  BLOOD GAS, VENOUS - Abnormal; Notable for the following:    pCO2, Ven 64 (*)    All other components within normal limits  PROTIME-INR  APTT  URINALYSIS COMPLETEWITH MICROSCOPIC ([facility] ONLY)  BRAIN NATRIURETIC PEPTIDE   ____________________________________________  EKG  ED ECG REPORT I, Eula Listen, the attending physician, personally viewed and interpreted this ECG.   Date: 05/29/2015  EKG Time: 1301  Rate: 85  Rhythm: Normal sinus rhythm with frequent PVCs  Axis: Normal  Intervals:first-degree A-V block ; prolonged QTC  ST&T Change: No ST elevation or ischemic changes.  ____________________________________________  RADIOLOGY  Dg Chest 2 View  05/29/2015  CLINICAL DATA:  Nonverbal today which is unusual for the patient, history of CHF,, chronic renal insufficiency on dialysis, previous CVA EXAM: CHEST  2 VIEW COMPARISON:  Portable chest x-ray of May 09, 2015 FINDINGS: The lungs are hypoinflated. There are moderate-sized bilateral pleural effusions layering posteriorly. The cardiac silhouette is enlarged. The central pulmonary vascularity is engorged. The pulmonary interstitial markings are mildly increased. The dialysis type catheter has its tip  projecting at the cavoatrial junction. The observed bony thorax exhibits no acute abnormality. IMPRESSION: CHF with pulmonary interstitial edema and moderate-sized bilateral pleural  effusions layering posteriorly. There has been interval deterioration since the previous study. Electronically Signed   By: David  Martinique M.D.   On: 05/29/2015 13:51   Ct Head Wo Contrast  05/29/2015  CLINICAL DATA:  81 year old female with lethargy and altered mental status. Initial encounter. EXAM: CT HEAD WITHOUT CONTRAST TECHNIQUE: Contiguous axial images were obtained from the base of the skull through the vertex without intravenous contrast. COMPARISON:  05/08/2015 and earlier. FINDINGS: New bilateral mastoid effusions and middle ear opacification. Negative visualized pharynx. Stable paranasal sinuses, trace mucosal thickening. Osteopenia. No acute osseous abnormality identified. Stable orbit and scalp soft tissues. Calcified atherosclerosis at the skull base. Stable cerebral volume. No midline shift, mass effect, or evidence of intracranial mass lesion. No ventriculomegaly. Scattered dural calcifications. Stable gray-white matter differentiation throughout the brain. No cortically based acute infarct identified. No acute intracranial hemorrhage identified. No suspicious intracranial vascular hyperdensity. IMPRESSION: 1. New bilateral middle ear and mastoid fluid/inflammation. 2. Otherwise no acute intracranial abnormality. Stable non contrast CT appearance of the brain. Electronically Signed   By: Genevie Ann M.D.   On: 05/29/2015 13:37    ____________________________________________   PROCEDURES  Procedure(s) performed: None  Critical Care performed: Yes, see note  CRITICAL CARE Performed by: Eula Listen   Total critical care time: 40  Critical care time was exclusive of separately billable procedures and treating other patients.  Critical care was necessary to treat or prevent imminent or  life-threatening deterioration.  Critical care was time spent personally by me on the following activities: development of treatment plan with patient and/or surrogate as well as nursing, discussions with consultants, evaluation of patient's response to treatment, examination of patient, obtaining history from patient or surrogate, ordering and performing treatments and interventions, ordering and review of laboratory studies, ordering and review of radiographic studies, pulse oximetry and re-evaluation of patient's condition.  ____________________________________________   INITIAL IMPRESSION / ASSESSMENT AND PLAN / ED COURSE  Pertinent labs & imaging results that were available during my care of the patient were reviewed by me and considered in my medical decision making (see chart for details).  82 y.o. twin [place] resident presenting for acute onset of altered mental status. On my exam cannot get a reliable neurologic exam. It does not appear that she has a focal motor deficit but she is unable for/unwilling to comply with motor exam. I will evaluate her for electrolyte abnormalities, signs of infection including UTI and pneumonia, acute coronary syndrome, stroke.  I would also consider pharmacologic causes of altered mental status given her recent hospitalization and change in medications.  ----------------------------------------- 2:30 PM on 05/29/2015 -----------------------------------------  The patient remains somnolent but arousable to voice. Her mental status is unchanged. Her oxygenation has remained in the mid to high 90s on 2 L nasal cannula which is her home oxygen. However, her gas is concerning for acidemia and hypercarbia. Her chest x-ray is consistent with pulmonary edema so I will plan to diuresis her and recheck by gas. If her gas is not improved now that her oxygenation has remained stable, I'll plan to initiate BiPAP.  ----------------------------------------- 3:19 PM on  05/29/2015 -----------------------------------------  Patient pH is improving now that she is stabilized on her own oxygen. She seems to be a chronic retainer and her CO2 is also coming down. She has acute on chronic renal insufficiency. Her troponin is positive but she has no evidence of acute ischemia on her EKG. She is also not having any chest pain. This will need to  be trended. Plan admission. ____________________________________________  FINAL CLINICAL IMPRESSION(S) / ED DIAGNOSES  Final diagnoses:  NSTEMI (non-ST elevated myocardial infarction) ([place])  Acute on chronic renal insufficiency ([facility])  Acute on chronic congestive heart failure, unspecified congestive heart failure type ([place])  Hypercarbia      NEW MEDICATIONS STARTED DURING THIS VISIT:  New Prescriptions   No medications on file     Eula Listen, MD 05/29/15 1523

## 2015-05-29 NOTE — H&P (Addendum)
[facility] NAME: Kristina Wilson    MR#:  [id number]  DATE OF BIRTH:  [birth year]  DATE OF ADMISSION:  05/29/2015  PRIMARY CARE PHYSICIAN: Albina Billet, MD   REQUESTING/REFERRING PHYSICIAN: Dr. Mariea Clonts  CHIEF COMPLAINT:   Chief Complaint  Patient presents with  . Altered Mental Status    HISTORY OF PRESENT ILLNESS:  Kristina Wilson  is a 81 y.o. female with a known history of ESRD on hemodialysis Tuesday to Thursday Saturday brought in from [facility] and secondary to lethargy. C in the well at around 9 AM this morning, when family arrived at 11:00 she was very lethargic. Noted to have a low-grade temp of 99 today. Had full  hemodialysis yesterday. Patient right now he is arousable and opens eyes anddenies any complaints. He was admitted recently from October 5 day O2 October 19. Patient hemodialysis on 10 8. Patient is staying at [facility] for the past 1 week. She also has peritoneal dialysis catheter which was placed recently, the plan is to take her home and start peritoneal dialysis once her SNF stay is over.  PAST MEDICAL HISTORY:   Past Medical History  Diagnosis Date  . Muscle weakness   . Chronic respiratory failure ([place])   . Pneumonia   . Sleep apnea   . CHF (congestive heart failure) ([place])   . CKD (chronic kidney disease), stage IV ([place])   . Dysphagia   . GERD (gastroesophageal reflux disease)   . Back pain   . Epilepsy ([place])   . Hypertension   . Stroke ([place])   . Headache   . Arthritis   . Anemia     iron deficiency anemia  . Complication of anesthesia     difficult waking up after surgery (2013)    PAST SURGICAL HISTOIRY:   Past Surgical History  Procedure Laterality Date  . Back surgery    . Knee surgery    . Carotid endarterectomy    . Tonsillectomy    . Appendectomy    . Joint replacement Bilateral     Knee Replacements  . Dilation and curettage of uterus    . Breast surgery  Right     Breast Biopsy  . Capd insertion N/A 04/12/2015    Procedure: LAPAROSCOPIC INSERTION CONTINUOUS AMBULATORY PERITONEAL DIALYSIS  (CAPD) CATHETER;  Surgeon: Katha Cabal, MD;  Location: [facility] ORS;  Service: Vascular;  Laterality: N/A;  . Peripheral vascular catheterization N/A 05/16/2015    Procedure: Dialysis/Perma Catheter Insertion;  Surgeon: Algernon Huxley, MD;  Location: [facility];  Service: Cardiovascular;  Laterality: N/A;    SOCIAL HISTORY:   Social History  Substance Use Topics  . Smoking status: Former Smoker -- 2.00 packs/day for 20 years    Types: Cigarettes    Quit date: 06/04/1967  . Smokeless tobacco: Never Used  . Alcohol Use: No    FAMILY HISTORY:   Family History  Problem Relation Age of Onset  . CAD Mother   . CAD Father   . Anemia Father   . CVA Sister   . Multiple myeloma Brother     DRUG ALLERGIES:   Allergies  Allergen Reactions  . Enalapril Anaphylaxis  . Tramadol Shortness Of Breath  . Capsaicin Other (See Comments)    Reaction:  Unknown   . Codeine Other (See Comments)    Reaction:  Unknown   . Hydrocodone Other (See Comments)    Reaction:  Unknown   . Oxycodone Other (See Comments)    Reaction:  Unknown   . Vicodin [Hydrocodone-Acetaminophen] Other (See Comments)    Reaction:  Unknown     REVIEW OF SYSTEMS: Unobtainable secondary to lethargy.     MEDICATIONS AT HOME:   Prior to Admission medications   Medication Sig Start Date End Date Taking? Authorizing Provider  amLODipine (NORVASC) 10 MG tablet Take 1 tablet (10 mg total) by mouth daily after supper. 05/22/15  Yes Vaughan Basta, MD  aspirin EC 81 MG EC tablet Take 1 tablet (81 mg total) by mouth daily. 05/22/15  Yes Vaughan Basta, MD  bimatoprost (LUMIGAN) 0.03 % ophthalmic solution Place 1 drop into both eyes 2 (two) times daily.   Yes Historical Provider, MD  carbamide peroxide (DEBROX) 6.5 % otic solution Place 5 drops into the right ear at  bedtime. 05/25/15 06/04/15 Yes Historical Provider, MD  carvedilol (COREG) 12.5 MG tablet Take 12.5 mg by mouth every 12 (twelve) hours.   Yes Historical Provider, MD  cloNIDine (CATAPRES) 0.1 MG tablet Take 0.1 mg by mouth every 8 (eight) hours. 05/28/15 05/31/15 Yes Historical Provider, MD  Coenzyme Q10 (COQ10) 50 MG CAPS Take 50 mg by mouth daily.   Yes Historical Provider, MD  cyanocobalamin 500 MCG tablet Take 500 mcg by mouth daily.   Yes Historical Provider, MD  docusate sodium (COLACE) 100 MG capsule Take 100 mg by mouth every other day.   Yes Historical Provider, MD  fluticasone (FLONASE) 50 MCG/ACT nasal spray Place 2 sprays into both nostrils daily. 03/18/15  Yes Srikar Sudini, MD  furosemide (LASIX) 40 MG tablet Take 40 mg by mouth daily.   Yes Historical Provider, MD  gabapentin (NEURONTIN) 100 MG capsule Take 200 mg by mouth every 8 (eight) hours.   Yes Historical Provider, MD  hydrALAZINE (APRESOLINE) 100 MG tablet Take 1 tablet (100 mg total) by mouth 3 (three) times daily. Patient taking differently: Take 100 mg by mouth every 6 (six) hours.  05/22/15  Yes Vaughan Basta, MD  iron polysaccharides (NIFEREX) 150 MG capsule Take 1 capsule (150 mg total) by mouth daily. 05/22/15  Yes Vaughan Basta, MD  lidocaine (LIDODERM) 5 % Place 1 patch onto the skin daily. Remove & Discard patch within 12 hours or as directed by MD   Yes Historical Provider, MD  loratadine (CLARITIN) 10 MG tablet Take 1 tablet (10 mg total) by mouth daily. 03/18/15  Yes Srikar Sudini, MD  Multiple Vitamin (MULTIVITAMIN WITH MINERALS) TABS tablet Take 1 tablet by mouth daily.   Yes Historical Provider, MD  Multiple Vitamins-Minerals (PRESERVISION AREDS PO) Take 1 capsule by mouth 2 (two) times daily.   Yes Historical Provider, MD  pantoprazole (PROTONIX) 40 MG tablet Take 40 mg by mouth 2 (two) times daily.   Yes Historical Provider, MD  phenytoin (DILANTIN) 100 MG ER capsule Take 300 mg by mouth at  bedtime.    Yes Historical Provider, MD  predniSONE (DELTASONE) 10 MG tablet Take 10 mg by mouth daily.   Yes Historical Provider, MD  ranitidine (ZANTAC) 150 MG tablet Take 150 mg by mouth 2 (two) times daily.   Yes Historical Provider, MD  sodium bicarbonate 650 MG tablet Take 1,300 mg by mouth 2 (two) times daily.   Yes Historical Provider, MD  acetaminophen (TYLENOL) 325 MG tablet Take 650 mg by mouth every 4 (four) hours as needed for mild pain, fever or headache.     Historical Provider, MD  carvedilol (COREG)  25 MG tablet Take 1 tablet (25 mg total) by mouth at bedtime. 05/22/15   Vaughan Basta, MD  cloNIDine (CATAPRES) 0.3 MG tablet Take 1 tablet (0.3 mg total) by mouth 3 (three) times daily. Patient not taking: Reported on 05/29/2015 03/18/15   Hillary Bow, MD  fentaNYL (DURAGESIC - DOSED MCG/HR) 25 MCG/HR patch Place 1 patch (25 mcg total) onto the skin every 3 (three) days. 05/22/15   Vaughan Basta, MD  lactulose (CHRONULAC) 10 GM/15ML solution Take 30 mLs (20 g total) by mouth daily as needed for mild constipation. Patient not taking: Reported on 05/29/2015 05/22/15   Vaughan Basta, MD  metaxalone (SKELAXIN) 800 MG tablet Take 800 mg by mouth every 8 (eight) hours as needed for muscle spasms.     Historical Provider, MD  nitroGLYCERIN (NITROSTAT) 0.4 MG SL tablet Place 1 tablet (0.4 mg total) under the tongue every 5 (five) minutes as needed for chest pain. 05/22/15   Vaughan Basta, MD  oxyCODONE (OXY IR/ROXICODONE) 5 MG immediate release tablet Take 1 tablet (5 mg total) by mouth 3 (three) times daily as needed for moderate pain or breakthrough pain. 05/22/15   Vaughan Basta, MD      VITAL SIGNS:  Blood pressure 164/70, pulse 85, temperature 98 F (36.7 C), temperature source Oral, resp. rate 15, SpO2 95 %.  PHYSICAL EXAMINATION:  GENERAL:  81 y.o.-year-old patient lying in the bed with no acute distress.  EYES: Pupils equal, round,  reactive to light and accommodation. No scleral icterus. Extraocular muscles intact.  HEENT: Head atraumatic, normocephalic. Oropharynx and nasopharynx clear.  NECK:  Supple, no jugular venous distention. No thyroid enlargement, no tenderness.  LUNGS: Normal breath sounds bilaterally, no wheezing, rales,rhonchi or crepitation. No use of accessory muscles of respiration.  CARDIOVASCULAR: S1, S2 normal. No murmurs, rubs, or gallops.  ABDOMEN: Soft, nontender, nondistended. Bowel sounds present. No organomegaly or mass.  EXTREMITIES: No pedal edema, cyanosis, or clubbing.  NEUROLOGIC: Cranial nerves II through XII are intact. Muscle strength 5/5 in all extremities. Sensation intact. Gait not checked.  PSYCHIATRIC: The patient is lethargic.  SKIN: No obvious rash, lesion, or ulcer.   LABORATORY PANEL:   CBC  Recent Labs Lab 05/29/15 1303  WBC 9.3  HGB 8.8*  HCT 26.8*  PLT 361   ------------------------------------------------------------------------------------------------------------------  Chemistries   Recent Labs Lab 05/29/15 1303  NA 133*  K 4.7  CL 94*  CO2 27  GLUCOSE 106*  BUN 10  CREATININE 1.75*  CALCIUM 8.9  AST 38  ALT 10*  ALKPHOS 96  BILITOT 1.0   ------------------------------------------------------------------------------------------------------------------  Cardiac Enzymes  Recent Labs Lab 05/29/15 1303  TROPONINI 0.05*   ------------------------------------------------------------------------------------------------------------------  RADIOLOGY:  Dg Chest 2 View  05/29/2015  CLINICAL DATA:  Nonverbal today which is unusual for the patient, history of CHF,, chronic renal insufficiency on dialysis, previous CVA EXAM: CHEST  2 VIEW COMPARISON:  Portable chest x-ray of May 09, 2015 FINDINGS: The lungs are hypoinflated. There are moderate-sized bilateral pleural effusions layering posteriorly. The cardiac silhouette is enlarged. The central  pulmonary vascularity is engorged. The pulmonary interstitial markings are mildly increased. The dialysis type catheter has its tip projecting at the cavoatrial junction. The observed bony thorax exhibits no acute abnormality. IMPRESSION: CHF with pulmonary interstitial edema and moderate-sized bilateral pleural effusions layering posteriorly. There has been interval deterioration since the previous study. Electronically Signed   By: David  Martinique M.D.   On: 05/29/2015 13:51   Ct Head Wo Contrast  05/29/2015  CLINICAL DATA:  81 year old female with lethargy and altered mental status. Initial encounter. EXAM: CT HEAD WITHOUT CONTRAST TECHNIQUE: Contiguous axial images were obtained from the base of the skull through the vertex without intravenous contrast. COMPARISON:  05/08/2015 and earlier. FINDINGS: New bilateral mastoid effusions and middle ear opacification. Negative visualized pharynx. Stable paranasal sinuses, trace mucosal thickening. Osteopenia. No acute osseous abnormality identified. Stable orbit and scalp soft tissues. Calcified atherosclerosis at the skull base. Stable cerebral volume. No midline shift, mass effect, or evidence of intracranial mass lesion. No ventriculomegaly. Scattered dural calcifications. Stable Kristina-white matter differentiation throughout the brain. No cortically based acute infarct identified. No acute intracranial hemorrhage identified. No suspicious intracranial vascular hyperdensity. IMPRESSION: 1. New bilateral middle ear and mastoid fluid/inflammation. 2. Otherwise no acute intracranial abnormality. Stable non contrast CT appearance of the brain. Electronically Signed   By: Genevie Ann M.D.   On: 05/29/2015 13:37    EKG:   Orders placed or performed during the hospital encounter of 05/29/15  . ED EKG  . ED EKG   sinus rhythm with first-degree AV block PVCs 85 bpm  IMPRESSION AND PLAN:   1. Plan Lethargy secondary to UTI: Started on Rocephin, follow urine  cultures. #2 acute on chronic diastolic heart failure: Patient is a hemodialysis patient, at this time she is not hypoxic BNP is marginally elevated. Consult nephrology for her routine hemodialysis needs. 3, ESRD on hemodialysis Tuesday Thursday Saturday #4 hypertension; controlled, continue Coreg 12.5 mg twice a day, hydralazine 100 mg 4 times a day, (hydralazine has been changed from 3 times a day to 4 times a day recently on 10/22 #5 .history of seizures continue phenytoin 100 mg at night. Chronic back pain issues patient is on now oxycodone  IR 5 MG 3 times a day, fentanyl patch 25 g every 72 hours .hold them them because she is lethargic.  Npo till alert Discussed the plan with patient's family especially the daughter. All the records are reviewed and case discussed with ED provider. Management plans discussed with the patient, family and they are in agreement.  CODE STATUS: [place]  TOTAL TIME TAKING CARE OF THIS PATIENT: 35  minutes.    Epifanio Lesches M.D on 05/29/2015 at 4:08 PM  Between 7am to 6pm - Pager - [phone]  After 6pm go to [email or web address] - password [email or web address] [facility]  Office  [phone]  CC: Primary care physician; Albina Billet, MD  Note: This dictation was prepared with Dragon dictation along with smaller phrase technology. Any transcriptional errors that result from this process are unintentional.

## 2015-05-30 LAB — PHENYTOIN LEVEL, TOTAL: Phenytoin Lvl: 4.2 ug/mL — ABNORMAL LOW (ref 10.0–20.0)

## 2015-05-30 LAB — BASIC METABOLIC PANEL
Anion gap: 6 (ref 5–15)
BUN: 15 mg/dL (ref 6–20)
CHLORIDE: 94 mmol/L — AB (ref 101–111)
CO2: 33 mmol/L — AB (ref 22–32)
CREATININE: 1.88 mg/dL — AB (ref 0.44–1.00)
Calcium: 8.9 mg/dL (ref 8.9–10.3)
GFR calc Af Amer: 26 mL/min — ABNORMAL LOW (ref >60)
GFR calc non Af Amer: 22 mL/min — ABNORMAL LOW (ref >60)
Glucose, Bld: 92 mg/dL (ref 65–99)
Potassium: 2.8 mmol/L — CL (ref 3.5–5.1)
Sodium: 133 mmol/L — ABNORMAL LOW (ref 135–145)

## 2015-05-30 LAB — CBC
HCT: 22.8 % — ABNORMAL LOW (ref 35.0–47.0)
Hemoglobin: 7.6 g/dL — ABNORMAL LOW (ref 12.0–16.0)
MCH: 32 pg (ref 26.0–34.0)
MCHC: 33.5 g/dL (ref 32.0–36.0)
MCV: 95.5 fL (ref 80.0–100.0)
PLATELETS: 348 10*3/uL (ref 150–440)
RBC: 2.38 MIL/uL — ABNORMAL LOW (ref 3.80–5.20)
RDW: 12.9 % (ref 11.5–14.5)
WBC: 8.7 10*3/uL (ref 3.6–11.0)

## 2015-05-30 LAB — GLUCOSE, CAPILLARY: Glucose-Capillary: 91 mg/dL (ref 65–99)

## 2015-05-30 LAB — PHOSPHORUS: Phosphorus: 3.5 mg/dL (ref 2.5–4.6)

## 2015-05-30 MED ORDER — SODIUM CHLORIDE 0.9 % IJ SOLN
3.0000 mL | INTRAMUSCULAR | Status: DC | PRN
  Administered 2015-06-05: 3 mL via INTRAVENOUS
  Filled 2015-05-30: qty 10

## 2015-05-30 MED ORDER — POTASSIUM CHLORIDE CRYS ER 20 MEQ PO TBCR
40.0000 meq | EXTENDED_RELEASE_TABLET | Freq: Once | ORAL | Status: AC
  Administered 2015-05-30: 40 meq via ORAL
  Filled 2015-05-30: qty 2

## 2015-05-30 MED ORDER — HEPARIN SODIUM (PORCINE) 1000 UNIT/ML DIALYSIS
1000.0000 [IU] | INTRAMUSCULAR | Status: DC | PRN
  Administered 2015-06-01: 3000 [IU] via INTRAVENOUS_CENTRAL
  Filled 2015-05-30 (×2): qty 1

## 2015-05-30 MED ORDER — SODIUM CHLORIDE 0.9 % IJ SOLN
3.0000 mL | Freq: Two times a day (BID) | INTRAMUSCULAR | Status: DC
  Administered 2015-05-30 – 2015-06-06 (×13): 3 mL via INTRAVENOUS

## 2015-05-30 MED ORDER — PENTAFLUOROPROP-TETRAFLUOROETH EX AERO
1.0000 "application " | INHALATION_SPRAY | CUTANEOUS | Status: DC | PRN
  Filled 2015-05-30: qty 30

## 2015-05-30 MED ORDER — LIDOCAINE HCL (PF) 1 % IJ SOLN
5.0000 mL | INTRAMUSCULAR | Status: DC | PRN
  Filled 2015-05-30: qty 5

## 2015-05-30 MED ORDER — SODIUM CHLORIDE 0.9 % IV SOLN: 100.0000 mL | INTRAVENOUS | Status: DC | PRN

## 2015-05-30 MED ORDER — ACETAMINOPHEN 325 MG PO TABS
650.0000 mg | ORAL_TABLET | Freq: Four times a day (QID) | ORAL | Status: DC | PRN
  Administered 2015-05-30: 650 mg via ORAL
  Filled 2015-05-30: qty 2

## 2015-05-30 MED ORDER — ALTEPLASE 2 MG IJ SOLR
2.0000 mg | Freq: Once | INTRAMUSCULAR | Status: DC | PRN
  Filled 2015-05-30: qty 2

## 2015-05-30 MED ORDER — LIDOCAINE-PRILOCAINE 2.5-2.5 % EX CREA
1.0000 | TOPICAL_CREAM | CUTANEOUS | Status: DC | PRN
Start: 2015-05-30 — End: 2015-06-06
  Filled 2015-05-30: qty 5

## 2015-05-30 NOTE — Clinical Social Work Note (Signed)
Clinical Social Work Assessment  Patient Details  Name: Kristina Wilson MRN: [id number] Date of Birth: 02/26/1923  Date of referral:  05/30/15               Reason for consult:  Facility Placement (patient is from [facility] at [facility])                Permission sought to share information with:  Facility Medical sales representativeContact Representative, Family Supports Permission granted to share information::  Yes, Verbal Permission Granted  Name::      Tresa Endo(Kelly)  Agency::     Relationship::   (daughter and POA)  Contact Information:   [phone] cell or [phone])  Housing/Transportation Living arrangements for the past 2 months:  Single Family Home, Skilled Nursing Facility Source of Information:  Adult Children, Medical Team Patient Interpreter Needed:  None Criminal Activity/Legal Involvement Pertinent to Current Situation/Hospitalization:  No - Comment as needed Significant Relationships:  Adult Children, Other Family Members Lives with:  Adult Children Do you feel safe going back to the place where you live?    Need for family participation in patient care:  Yes (Comment)  Care giving concerns:  Daughter is concerned with patient returning to current SNF.   Social Worker assessment / plan:  Visual merchandiserClinical Social Worker (CSW) consult, patient is from [facility].   CSW introduced self and explained role of CSW department.  Daughter Tresa EndoKelly at bedside providing information.  Tresa EndoKelly is HPOA has some concerns about the care patient has received at [facility].  Has spoken to Administration about her concerns.  Prior to going to SNF patient lived at home with her daughter.   Dau. wants new bed search for [facility] to [facility] and is now willing to pay [facility] to Transport patient to go to Dialysis on Saturday.  Patient dialysis on Tuesday, Thursday, Sat. 10:30 at [facility].  CSW will complete FL2 and fax out in anticipation of patient going to SNF.    Employment status:  Retired Health and safety inspectornsurance information:  [employer] PT Recommendations:  Skilled Holiday representativeursing Facility (from previous evaluation) Information / Referral to community resources:  Skilled Nursing Facility  Patient/Family's Response to care:  Patient's daughter is Adult nurseappreciative of CSW doing new bed search.   Patient/Family's Understanding of and Emotional Response to Diagnosis, Current Treatment, and Prognosis:  Daughter understands that patient is under continued medical workup but will discharge to SNF when medically cleared by MD.  Emotional Assessment Appearance:  Appears younger than stated age Attitude/Demeanor/Rapport:  Lethargic Affect (typically observed):  Quiet, Calm Orientation:  Oriented to Self Alcohol / Substance use:  Never Used Psych involvement (Current and /or in the community):  No (Comment)  Discharge Needs  Concerns to be addressed:  Care Coordination Readmission within the last 30 days:  Yes Current discharge risk:  Chronically ill, Dependent with Mobility Barriers to Discharge:  Continued Medical Work up, No Barriers Identified   Soundra PilonMoore, Bradyn Soward H, LCSW 05/30/2015, 10:12 AM

## 2015-05-30 NOTE — Progress Notes (Signed)
Subjective:  Patient was recently discharged to [facility].  Has been going to dialysis on TTHS schedule.  Due for HD today. Presented this time with altered mental status. Daughter currently at bedside and states her mother was quite confused over the past several days. Tentatively she has been diagnosed with a UTI.  Objective:  Vital signs in last 24 hours:  Temp:  [97.6 F (36.4 C)-98.3 F (36.8 C)] 97.6 F (36.4 C) (10/27 0421) Pulse Rate:  [60-126] 81 (10/27 0422) Resp:  [14-20] 18 (10/27 0421) BP: (137-192)/(26-83) 137/83 mmHg (10/27 0421) SpO2:  [90 %-100 %] 90 % (10/27 0421) Weight:  [91.491 kg (201 lb 11.2 oz)-91.581 kg (201 lb 14.4 oz)] 91.491 kg (201 lb 11.2 oz) (10/27 0421)  Weight change:  Filed Weights   05/29/15 1654 05/30/15 0421  Weight: 91.581 kg (201 lb 14.4 oz) 91.491 kg (201 lb 11.2 oz)    Intake/Output:     Intake/Output this shift:     Physical Exam: General: NAD, frail , elderly  Head:  Moist oral mucosal membranes  Eyes: Anicteric  Neck: Supple, trachea midline  Lungs:  Mild scattered rhonchi, normal effort  Heart: Irregular, no rubs  Abdomen:  Soft, nontender, +peritoneal dialysis catheter  Extremities: 1+ peripheral edema  Neurologic: Awake, will follow simple commands  Skin: No acute lesions  Access: PD catheter, R IJ Permcath placed 05/16/15.    Basic Metabolic Panel:  Recent Labs Lab 05/29/15 1303 05/30/15 0407  NA 133* 133*  K 4.7 2.8*  CL 94* 94*  CO2 27 33*  GLUCOSE 106* 92  BUN 10 15  CREATININE 1.75* 1.88*  CALCIUM 8.9 8.9    Liver Function Tests:  Recent Labs Lab 05/29/15 1303  AST 38  ALT 10*  ALKPHOS 96  BILITOT 1.0  PROT 6.1*  ALBUMIN 2.9*   No results for input(s): LIPASE, AMYLASE in the last 168 hours. No results for input(s): AMMONIA in the last 168 hours.  CBC:  Recent Labs Lab 05/29/15 1303 05/30/15 0407  WBC 9.3 8.7  HGB 8.8* 7.6*  HCT 26.8* 22.8*  MCV 95.8 95.5  PLT 361 348     Cardiac Enzymes:  Recent Labs Lab 05/29/15 1303  TROPONINI 0.05*    BNP: Invalid input(s): POCBNP  CBG:  Recent Labs Lab 05/30/15 0727  GLUCAP 91    Microbiology: Results for orders placed or performed during the hospital encounter of 05/07/15  MRSA PCR Screening     Status: Abnormal   Collection Time: 05/08/15  7:50 AM  Result Value Ref Range Status   MRSA by PCR POSITIVE (A) NEGATIVE Final    Comment:        The GeneXpert MRSA Assay (FDA approved for NASAL specimens only), is one component of a comprehensive MRSA colonization surveillance program. It is not intended to diagnose MRSA infection nor to guide or monitor treatment for MRSA infections. CRITICAL RESULT CALLED TO, READ BACK BY AND VERIFIED WITH: JESSICA CHRISTMAS ON 05/08/15 AT 1022 BY QSD     Coagulation Studies:  Recent Labs  05/29/15 1413  LABPROT 13.8  INR 1.04    Urinalysis:  Recent Labs  05/29/15 1413  COLORURINE YELLOW*  LABSPEC 1.021  PHURINE 5.0  GLUCOSEU 50*  HGBUR NEGATIVE  BILIRUBINUR NEGATIVE  KETONESUR NEGATIVE  PROTEINUR 100*  NITRITE NEGATIVE  LEUKOCYTESUR 2+*      Imaging: Dg Chest 2 View  05/29/2015  CLINICAL DATA:  Nonverbal today which is unusual for the patient, history of CHF,,  chronic renal insufficiency on dialysis, previous CVA EXAM: CHEST  2 VIEW COMPARISON:  Portable chest x-ray of May 09, 2015 FINDINGS: The lungs are hypoinflated. There are moderate-sized bilateral pleural effusions layering posteriorly. The cardiac silhouette is enlarged. The central pulmonary vascularity is engorged. The pulmonary interstitial markings are mildly increased. The dialysis type catheter has its tip projecting at the cavoatrial junction. The observed bony thorax exhibits no acute abnormality. IMPRESSION: CHF with pulmonary interstitial edema and moderate-sized bilateral pleural effusions layering posteriorly. There has been interval deterioration since the previous  study. Electronically Signed   By: David  SwazilandJordan M.D.   On: 05/29/2015 13:51   Ct Head Wo Contrast  05/29/2015  CLINICAL DATA:  81 year old female with lethargy and altered mental status. Initial encounter. EXAM: CT HEAD WITHOUT CONTRAST TECHNIQUE: Contiguous axial images were obtained from the base of the skull through the vertex without intravenous contrast. COMPARISON:  05/08/2015 and earlier. FINDINGS: New bilateral mastoid effusions and middle ear opacification. Negative visualized pharynx. Stable paranasal sinuses, trace mucosal thickening. Osteopenia. No acute osseous abnormality identified. Stable orbit and scalp soft tissues. Calcified atherosclerosis at the skull base. Stable cerebral volume. No midline shift, mass effect, or evidence of intracranial mass lesion. No ventriculomegaly. Scattered dural calcifications. Stable gray-white matter differentiation throughout the brain. No cortically based acute infarct identified. No acute intracranial hemorrhage identified. No suspicious intracranial vascular hyperdensity. IMPRESSION: 1. New bilateral middle ear and mastoid fluid/inflammation. 2. Otherwise no acute intracranial abnormality. Stable non contrast CT appearance of the brain. Electronically Signed   By: Odessa FlemingH  Hall M.D.   On: 05/29/2015 13:37     Medications:     . amLODipine  10 mg Oral QPC supper  . aspirin EC  81 mg Oral Daily  . carvedilol  12.5 mg Oral Q12H  . cefTRIAXone (ROCEPHIN)  IV  1 g Intravenous Q12H  . cyanocobalamin  500 mcg Oral Daily  . docusate sodium  100 mg Oral BID  . famotidine  10 mg Oral Daily  . fluticasone  2 spray Each Nare Daily  . furosemide  40 mg Oral Daily  . gabapentin  200 mg Oral 3 times per day  . heparin  5,000 Units Subcutaneous 3 times per day  . iron polysaccharides  150 mg Oral Daily  . latanoprost  1 drop Both Eyes QHS  . lidocaine  1 patch Transdermal Q24H  . loratadine  10 mg Oral Daily  . multivitamin with minerals  1 tablet Oral Daily   . multivitamin-lutein  1 capsule Oral BID  . pantoprazole  40 mg Oral BID  . phenytoin  300 mg Oral QHS  . predniSONE  10 mg Oral Daily  . sodium bicarbonate  1,300 mg Oral BID   alum & mag hydroxide-simeth, bisacodyl, ondansetron **OR** ondansetron (ZOFRAN) IV, promethazine  Assessment/ Plan:  Ms. Kristina RhodesJacquelyn R Wilson is a 81 y.o. white female with past medical history of hypertension, obstructive sleep apnea, DJD, CVA with residual left sided weakness, carotid stenosis status post L CEA 2/15, seizure disorder, diastolic congestive heart failure, anemia  1. End Stage Renal Disease with proteinuria: progression of disease from Chronic kidney disease stage V. 24 hour creatinine clearance 12. Initiated on hemodialysis on 10/8 through right femoral temp HD catheter. R IJ permcath placed 05/16/15.  [facility]. -Pt seen at bedside, last underwent HD on Tuesday.  Will plan for HD again today, UF target 1kg.`  2. Hypertension. Has history of labile blood  pressure.Very difficult time treating hypertension and avoiding side effects.   Allergy to enalapril (causes anaphylaxis). -  BP 137/83 at the moment, continue amlodipine, coreg for now, but previously was on many more antihypertensives.  3. Secondary hyperparathyroidism. Check phos and PTH with HD today.  4. Anemia chronic kidney disease: hgb 7.6, start patient on epogen 10000 units IV with HD.  Consider blood transfusion for hgb of 7 or less.  5.  Altered mental status/UTI:  Thought to be secondary to UTI, being treated with ceftriaxone.   LOS: 1  Cherrish Vitali 10/27/201610:25 AM

## 2015-05-30 NOTE — NC FL2 (Signed)
MEDICAID FL2 LEVEL OF CARE SCREENING TOOL     IDENTIFICATION  Patient Name: Kristina Wilson Birthdate: 12/20/1922 Sex: female Admission Date (Current Location): 05/29/2015  [place] and [place] Number:  Air cabin crew)   Facility and Address:  [facility], [address], [place], [place] [address]      Provider Number:  [id number]  Attending Physician Name and Address:  Houston Siren, MD  Relative Name and Phone Number:       Current Level of Care: Hospital Recommended Level of Care: Skilled Nursing Facility Prior Approval Number:    Date Approved/Denied:   PASRR Number:  [id number])  Discharge Plan: SNF    Current Diagnoses: Patient Active Problem List   Diagnosis Date Noted  . Altered mental status 05/29/2015  . Pressure ulcer 05/15/2015  . Accelerated hypertension 05/08/2015  . Elevated troponin 05/08/2015  . GERD (gastroesophageal reflux disease) 05/08/2015  . Epilepsy (HCC) 05/08/2015  . Chronic CHF (congestive heart failure) (HCC) 05/08/2015  . UTI (urinary tract infection) 03/18/2015  . Acute sinusitis 03/18/2015  . Weakness of both lower limbs 03/15/2015  . GI bleed 12/27/2014  . Hypotension 12/27/2014  . Anemia of chronic disease 12/27/2014  . Acute posthemorrhagic anemia 12/27/2014  . CKD (chronic kidney disease), stage IV (HCC) 12/27/2014  . Cellulitis 12/27/2014    Orientation ACTIVITIES/SOCIAL BLADDER RESPIRATION    Self, Place    Incontinent O2 (As needed) (2 L)  BEHAVIORAL SYMPTOMS/MOOD NEUROLOGICAL BOWEL NUTRITION STATUS     (NA) Incontinent Diet (Dysphagia 1)  PHYSICIAN VISITS COMMUNICATION OF NEEDS Height & Weight Skin  30 days Verbally  (162.6 cm) 201 lbs. Skin abrasions, PU Stage and Appropriate Care          AMBULATORY STATUS RESPIRATION    Assist extensive O2 (As needed) (2 L)      Personal Care Assistance Level of Assistance  Bathing, Feeding, Dressing Bathing Assistance: Maximum  assistance Feeding assistance: Limited assistance Dressing Assistance: Maximum assistance      Functional Limitations Info  Sight, Hearing, Speech Sight Info: Adequate Hearing Info: Impaired Speech Info: Adequate       SPECIAL CARE FACTORS FREQUENCY  PT (By licensed PT)     PT Frequency:  (3 to 5)             Additional Factors Info  Allergies, Isolation Precautions   Allergies Info:  (Enalapril, Tramadol, Capsaicin, Codeine, Hydrocodone, Oxycodone, Vicodin)     Isolation Precautions Info:  (05/08/15 mrsa pcr positive)     Current Medications (05/30/2015): Current Facility-Administered Medications  Medication Dose Route Frequency Provider Last Rate Last Dose  . 0.9 %  sodium chloride infusion  100 mL Intravenous PRN Munsoor Lateef, MD      . 0.9 %  sodium chloride infusion  100 mL Intravenous PRN Munsoor Lateef, MD      . alteplase (CATHFLO ACTIVASE) injection 2 mg  2 mg Intracatheter Once PRN Munsoor Lateef, MD      . alum & mag hydroxide-simeth (MAALOX/MYLANTA) 200-200-20 MG/5ML suspension 30 mL  30 mL Oral Q6H PRN Katha Hamming, MD      . amLODipine (NORVASC) tablet 10 mg  10 mg Oral QPC supper Katha Hamming, MD   10 mg at 05/29/15 2215  . aspirin EC tablet 81 mg  81 mg Oral Daily Katha Hamming, MD   81 mg at 05/29/15 1630  . bisacodyl (DULCOLAX) EC tablet 5 mg  5 mg Oral Daily PRN Katha Hamming, MD      .  carvedilol (COREG) tablet 12.5 mg  12.5 mg Oral Q12H Katha Hamming, MD   12.5 mg at 05/29/15 2215  . cefTRIAXone (ROCEPHIN) 1 g in dextrose 5 % 50 mL IVPB  1 g Intravenous Q12H Katha Hamming, MD   1 g at 05/29/15 2228  . cyanocobalamin tablet 500 mcg  500 mcg Oral Daily Katha Hamming, MD   500 mcg at 05/29/15 1630  . docusate sodium (COLACE) capsule 100 mg  100 mg Oral BID Katha Hamming, MD   100 mg at 05/29/15 2226  . famotidine (PEPCID) tablet 10 mg  10 mg Oral Daily Katha Hamming, MD   10 mg at 05/29/15 1630   . fluticasone (FLONASE) 50 MCG/ACT nasal spray 2 spray  2 spray Each Nare Daily Katha Hamming, MD   2 spray at 05/30/15 1036  . furosemide (LASIX) tablet 40 mg  40 mg Oral Daily Katha Hamming, MD   40 mg at 05/29/15 1630  . gabapentin (NEURONTIN) capsule 200 mg  200 mg Oral 3 times per day Katha Hamming, MD   200 mg at 05/29/15 2216  . heparin injection 1,000 Units  1,000 Units Dialysis PRN Munsoor Lateef, MD      . heparin injection 5,000 Units  5,000 Units Subcutaneous 3 times per day Katha Hamming, MD   5,000 Units at 05/30/15 4098  . iron polysaccharides (NIFEREX) capsule 150 mg  150 mg Oral Daily Katha Hamming, MD   150 mg at 05/29/15 1630  . latanoprost (XALATAN) 0.005 % ophthalmic solution 1 drop  1 drop Both Eyes QHS Katha Hamming, MD   1 drop at 05/29/15 2247  . lidocaine (LIDODERM) 5 % 1 patch  1 patch Transdermal Q24H Katha Hamming, MD   1 patch at 05/29/15 1630  . lidocaine (PF) (XYLOCAINE) 1 % injection 5 mL  5 mL Intradermal PRN Munsoor Lateef, MD      . lidocaine-prilocaine (EMLA) cream 1 application  1 application Topical PRN Munsoor Lateef, MD      . loratadine (CLARITIN) tablet 10 mg  10 mg Oral Daily Katha Hamming, MD   10 mg at 05/29/15 1630  . multivitamin with minerals tablet 1 tablet  1 tablet Oral Daily Katha Hamming, MD   1 tablet at 05/29/15 1630  . multivitamin-lutein (OCUVITE-LUTEIN) capsule 1 capsule  1 capsule Oral BID Katha Hamming, MD   1 capsule at 05/29/15 2222  . ondansetron (ZOFRAN) tablet 4 mg  4 mg Oral Q6H PRN Katha Hamming, MD       Or  . ondansetron (ZOFRAN) injection 4 mg  4 mg Intravenous Q6H PRN Katha Hamming, MD      . pantoprazole (PROTONIX) EC tablet 40 mg  40 mg Oral BID Katha Hamming, MD   40 mg at 05/29/15 2222  . pentafluoroprop-tetrafluoroeth (GEBAUERS) aerosol 1 application  1 application Topical PRN Munsoor Lateef, MD      . phenytoin (DILANTIN) ER capsule 300 mg   300 mg Oral QHS Katha Hamming, MD   300 mg at 05/29/15 2246  . predniSONE (DELTASONE) tablet 10 mg  10 mg Oral Daily Katha Hamming, MD   10 mg at 05/29/15 1630  . promethazine (PHENERGAN) tablet 25 mg  25 mg Oral Q4H PRN Katha Hamming, MD      . sodium bicarbonate tablet 1,300 mg  1,300 mg Oral BID Katha Hamming, MD   1,300 mg at 05/29/15 2222   Do not use this list as official medication orders. Please verify with discharge summary.  Discharge Medications:   Medication List    ASK your doctor about these medications        acetaminophen 325 MG tablet  Commonly known as:  TYLENOL  Take 650 mg by mouth every 4 (four) hours as needed for mild pain, fever or headache.     amLODipine 10 MG tablet  Commonly known as:  NORVASC  Take 1 tablet (10 mg total) by mouth daily after supper.     aspirin 81 MG EC tablet  Take 1 tablet (81 mg total) by mouth daily.     bimatoprost 0.03 % ophthalmic solution  Commonly known as:  LUMIGAN  Place 1 drop into both eyes 2 (two) times daily.     carbamide peroxide 6.5 % otic solution  Commonly known as:  DEBROX  Place 5 drops into the right ear at bedtime.     carvedilol 12.5 MG tablet  Commonly known as:  COREG  Take 12.5 mg by mouth every 12 (twelve) hours.     carvedilol 25 MG tablet  Commonly known as:  COREG  Take 1 tablet (25 mg total) by mouth at bedtime.     cloNIDine 0.3 MG tablet  Commonly known as:  CATAPRES  Take 1 tablet (0.3 mg total) by mouth 3 (three) times daily.     cloNIDine 0.1 MG tablet  Commonly known as:  CATAPRES  Take 0.1 mg by mouth every 8 (eight) hours.     CoQ10 50 MG Caps  Take 50 mg by mouth daily.     cyanocobalamin 500 MCG tablet  Take 500 mcg by mouth daily.     docusate sodium 100 MG capsule  Commonly known as:  COLACE  Take 100 mg by mouth every other day.     fentaNYL 25 MCG/HR patch  Commonly known as:  DURAGESIC - dosed mcg/hr  Place 1 patch (25 mcg total) onto the  skin every 3 (three) days.     fluticasone 50 MCG/ACT nasal spray  Commonly known as:  FLONASE  Place 2 sprays into both nostrils daily.     furosemide 40 MG tablet  Commonly known as:  LASIX  Take 40 mg by mouth daily.     gabapentin 100 MG capsule  Commonly known as:  NEURONTIN  Take 200 mg by mouth every 8 (eight) hours.     gentamicin cream 0.1 %  Commonly known as:  GARAMYCIN  Apply 1 application topically every 12 (twelve) hours as needed (for irritation).     hydrALAZINE 100 MG tablet  Commonly known as:  APRESOLINE  Take 1 tablet (100 mg total) by mouth 3 (three) times daily.     iron polysaccharides 150 MG capsule  Commonly known as:  NIFEREX  Take 1 capsule (150 mg total) by mouth daily.     lactulose 10 GM/15ML solution  Commonly known as:  CHRONULAC  Take 20 g by mouth daily as needed for mild constipation.     lactulose 10 GM/15ML solution  Commonly known as:  CHRONULAC  Take 30 mLs (20 g total) by mouth daily as needed for mild constipation.     lidocaine 5 %  Commonly known as:  LIDODERM  Place 1 patch onto the skin daily. Remove & Discard patch within 12 hours or as directed by MD     loratadine 10 MG tablet  Commonly known as:  CLARITIN  Take 1 tablet (10 mg total) by mouth daily.     metaxalone 800 MG tablet  Commonly known as:  SKELAXIN  Take 800 mg by mouth every 8 (eight) hours as needed for muscle spasms.     multivitamin with minerals Tabs tablet  Take 1 tablet by mouth daily.     nitroGLYCERIN 0.4 MG SL tablet  Commonly known as:  NITROSTAT  Place 1 tablet (0.4 mg total) under the tongue every 5 (five) minutes as needed for chest pain.     oxyCODONE 5 MG immediate release tablet  Commonly known as:  Oxy IR/ROXICODONE  Take 1 tablet (5 mg total) by mouth 3 (three) times daily as needed for moderate pain or breakthrough pain.     pantoprazole 40 MG tablet  Commonly known as:  PROTONIX  Take 40 mg by mouth 2 (two) times daily.      phenytoin 100 MG ER capsule  Commonly known as:  DILANTIN  Take 300 mg by mouth at bedtime.     predniSONE 10 MG tablet  Commonly known as:  DELTASONE  Take 10 mg by mouth daily.     PRESERVISION AREDS PO  Take 1 capsule by mouth 2 (two) times daily.     promethazine 25 MG tablet  Commonly known as:  PHENERGAN  Take 25 mg by mouth every 4 (four) hours as needed for nausea or vomiting.     ranitidine 150 MG tablet  Commonly known as:  ZANTAC  Take 150 mg by mouth 2 (two) times daily.     sodium bicarbonate 650 MG tablet  Take 1,300 mg by mouth 2 (two) times daily.        Relevant Imaging Results:  Relevant Lab Results:  Recent Labs    Additional Information  Patient has dialysis with [facility] in [place] 10:30 Tues, [place] and Sat,    [place], [place]

## 2015-05-30 NOTE — Progress Notes (Addendum)
Critical value of potassium 2.8 this morning.  MD, Dr. Tobi BastosPyreddy notified.  MD to place order for replacement.  Will continue to monitor. Louis MeckelMcRae, Kyley Solow H

## 2015-05-30 NOTE — Progress Notes (Signed)
[facility] [place] [facility]   PATIENT NAME: Kristina Wilson    MR#:  [id number]  DATE OF BIRTH:  Feb 06, 1923  SUBJECTIVE:  CHIEF COMPLAINT:   Chief Complaint  Patient presents with  . Altered Mental Status   Patient here due to altered mental status and lethargy and also noted to have a urinary tract infection. Patient seen at hemodialysis today and seems lethargic. No complaints presently.  REVIEW OF SYSTEMS:    Review of Systems  Constitutional: Negative for fever and chills.  HENT: Negative for congestion and tinnitus.   Eyes: Negative for blurred vision and double vision.  Respiratory: Negative for cough, shortness of breath and wheezing.   Cardiovascular: Negative for chest pain, orthopnea and PND.  Gastrointestinal: Negative for nausea, vomiting, abdominal pain and diarrhea.  Genitourinary: Negative for dysuria and hematuria.  Neurological: Positive for weakness (generalized). Negative for dizziness, sensory change and focal weakness.  All other systems reviewed and are negative.   Nutrition: Dysphagia 1 Tolerating Diet: Yes Tolerating PT:  Bedbound at baseline.    DRUG ALLERGIES:   Allergies  Allergen Reactions  . Enalapril Anaphylaxis  . Tramadol Shortness Of Breath  . Capsaicin Other (See Comments)    Reaction:  Unknown   . Codeine Other (See Comments)    Reaction:  Unknown   . Hydrocodone Other (See Comments)    Reaction:  Unknown   . Oxycodone Other (See Comments)    Reaction:  Unknown   . Vicodin [Hydrocodone-Acetaminophen] Other (See Comments)    Reaction:  Unknown     VITALS:  Blood pressure 158/47, pulse 82, temperature 97.9 F (36.6 C), temperature source Oral, resp. rate 24, height  (1.626 m), weight 91.4 kg (201 lb 8 oz), SpO2 97 %.  PHYSICAL EXAMINATION:   Physical Exam  GENERAL:  82 y.o.-year-old patient lying in the bed lethargic but responsive to verbal stimuli.Marland Kitchen  EYES: Pupils equal, round,  reactive to light and accommodation. No scleral icterus. Extraocular muscles intact.  HEENT: Head atraumatic, normocephalic. Oropharynx and nasopharynx clear.  NECK:  Supple, no jugular venous distention. No thyroid enlargement, no tenderness.  LUNGS: Poor Resp. effort, no wheezing, rales, rhonchi. No use of accessory muscles of respiration.  CARDIOVASCULAR: S1, S2 normal. No murmurs, rubs, or gallops.  ABDOMEN: Soft, nontender, nondistended. Bowel sounds present. No organomegaly or mass.  EXTREMITIES: No cyanosis, clubbing.  +1 dependent edema b/l.     NEUROLOGIC: Cranial nerves II through XII are intact. No focal Motor or sensory deficits b/l.  Globally weak PSYCHIATRIC: The patient is alert and oriented x 1.  SKIN: No obvious rash, lesion, or ulcer.   Right Subclavian Perm-cath for dialysis.    LABORATORY PANEL:   CBC  Recent Labs Lab 05/30/15 0407  WBC 8.7  HGB 7.6*  HCT 22.8*  PLT 348   ------------------------------------------------------------------------------------------------------------------  Chemistries   Recent Labs Lab 05/29/15 1303 05/30/15 0407  NA 133* 133*  K 4.7 2.8*  CL 94* 94*  CO2 27 33*  GLUCOSE 106* 92  BUN 10 15  CREATININE 1.75* 1.88*  CALCIUM 8.9 8.9  AST 38  --   ALT 10*  --   ALKPHOS 96  --   BILITOT 1.0  --    ------------------------------------------------------------------------------------------------------------------  Cardiac Enzymes  Recent Labs Lab 05/29/15 1303  TROPONINI 0.05*   ------------------------------------------------------------------------------------------------------------------  RADIOLOGY:  Dg Chest 2 View  05/29/2015  CLINICAL DATA:  Nonverbal today which is unusual for the patient, history of CHF,,  chronic renal insufficiency on dialysis, previous CVA EXAM: CHEST  2 VIEW COMPARISON:  Portable chest x-ray of May 09, 2015 FINDINGS: The lungs are hypoinflated. There are moderate-sized bilateral  pleural effusions layering posteriorly. The cardiac silhouette is enlarged. The central pulmonary vascularity is engorged. The pulmonary interstitial markings are mildly increased. The dialysis type catheter has its tip projecting at the cavoatrial junction. The observed bony thorax exhibits no acute abnormality. IMPRESSION: CHF with pulmonary interstitial edema and moderate-sized bilateral pleural effusions layering posteriorly. There has been interval deterioration since the previous study. Electronically Signed   By: David  SwazilandJordan M.D.   On: 05/29/2015 13:51   Ct Head Wo Contrast  05/29/2015  CLINICAL DATA:  81 year old female with lethargy and altered mental status. Initial encounter. EXAM: CT HEAD WITHOUT CONTRAST TECHNIQUE: Contiguous axial images were obtained from the base of the skull through the vertex without intravenous contrast. COMPARISON:  05/08/2015 and earlier. FINDINGS: New bilateral mastoid effusions and middle ear opacification. Negative visualized pharynx. Stable paranasal sinuses, trace mucosal thickening. Osteopenia. No acute osseous abnormality identified. Stable orbit and scalp soft tissues. Calcified atherosclerosis at the skull base. Stable cerebral volume. No midline shift, mass effect, or evidence of intracranial mass lesion. No ventriculomegaly. Scattered dural calcifications. Stable gray-white matter differentiation throughout the brain. No cortically based acute infarct identified. No acute intracranial hemorrhage identified. No suspicious intracranial vascular hyperdensity. IMPRESSION: 1. New bilateral middle ear and mastoid fluid/inflammation. 2. Otherwise no acute intracranial abnormality. Stable non contrast CT appearance of the brain. Electronically Signed   By: Odessa FlemingH  Hall M.D.   On: 05/29/2015 13:37     ASSESSMENT AND PLAN:   81 year old female with past medical history of end-stage renal disease on hemodialysis, hypertension, history of seizures, CHF, GERD, history of  previous CVA, chronic anemia, who presented to the hospital due to altered mental status and noted to have urinary tract infection.  #1 altered mental status-etiology unclear but suspected to be metabolic in nature secondary to UTI. -Patient's CT head on admission was negative. Pt. Does have a hx of seizures and ?? Post-ictal state but no evidence of seizures clinically.  - will get Dilantin level. Also cont. IV Ceftriaxone for UTI and follow mental status.   #2 urinary tract infection-continue IV ceftriaxone. Follow urine cultures.  #3 end-stage renal disease on hemodialysis-nephrology following continue dialysis on her scheduled days.  #4 history of seizures-no acute seizure type activity. Continue Dilantin. We'll check Dilantin level.  #5 hypertension-continue Coreg, amlodipine.  #6 neuropathy-continue Neurontin.  #7 glaucoma-continue Xalantan eye drops.   #8 GERD - cont. Protonix.    All the records are reviewed and case discussed with Care Management/Social Workerr. Management plans discussed with the patient, family and they are in agreement.  CODE STATUS: Full  DVT Prophylaxis: Heparin SQ  TOTAL TIME TAKING CARE OF THIS PATIENT: 30 minutes.   POSSIBLE D/C IN 2-3 DAYS, DEPENDING ON CLINICAL CONDITION.   Houston SirenSAINANI,Daeron Carreno J M.D on 05/30/2015 at 1:33 PM  Between 7am to 6pm - Pager - [phone]  After 6pm go to [email or web address] - password [email or web address] [facility]  [facility]  Office  [phone]  CC: Primary care physician; Jaclyn ShaggyATE,DENNY C, MD

## 2015-05-30 NOTE — Care Management Note (Signed)
[facility] 2nd [facility]. I will follow admission and send updated records to the dialysis center.  Ivor ReiningKim Kensli Bowley Dialysis Liaison  [phone]

## 2015-05-31 LAB — BLOOD GAS, ARTERIAL
ACID-BASE EXCESS: 10.7 mmol/L — AB (ref 0.0–3.0)
Allens test (pass/fail): POSITIVE — AB
Bicarbonate: 38.5 mEq/L — ABNORMAL HIGH (ref 21.0–28.0)
FIO2: 0.29
O2 Saturation: 97.4 %
PCO2 ART: 65 mmHg — AB (ref 32.0–48.0)
PH ART: 7.38 (ref 7.350–7.450)
Patient temperature: 37
pO2, Arterial: 97 mmHg (ref 83.0–108.0)

## 2015-05-31 LAB — URINE CULTURE

## 2015-05-31 LAB — CBC
HEMATOCRIT: 25.1 % — AB (ref 35.0–47.0)
HEMOGLOBIN: 8.2 g/dL — AB (ref 12.0–16.0)
MCH: 31.2 pg (ref 26.0–34.0)
MCHC: 32.8 g/dL (ref 32.0–36.0)
MCV: 95.3 fL (ref 80.0–100.0)
Platelets: 352 10*3/uL (ref 150–440)
RBC: 2.63 MIL/uL — AB (ref 3.80–5.20)
RDW: 13.3 % (ref 11.5–14.5)
WBC: 8 10*3/uL (ref 3.6–11.0)

## 2015-05-31 LAB — PARATHYROID HORMONE, INTACT (NO CA): PTH: 100 pg/mL — ABNORMAL HIGH (ref 15–65)

## 2015-05-31 LAB — GLUCOSE, CAPILLARY: Glucose-Capillary: 89 mg/dL (ref 65–99)

## 2015-05-31 LAB — POTASSIUM: Potassium: 3.1 mmol/L — ABNORMAL LOW (ref 3.5–5.1)

## 2015-05-31 MED ORDER — NEPRO/CARBSTEADY PO LIQD
237.0000 mL | Freq: Two times a day (BID) | ORAL | Status: DC
  Administered 2015-06-01 – 2015-06-06 (×9): 237 mL via ORAL

## 2015-05-31 MED ORDER — CLONIDINE HCL 0.1 MG PO TABS
0.1000 mg | ORAL_TABLET | Freq: Three times a day (TID) | ORAL | Status: DC
  Administered 2015-06-01 – 2015-06-06 (×16): 0.1 mg via ORAL
  Filled 2015-05-31 (×16): qty 1

## 2015-05-31 MED ORDER — EPOETIN ALFA 10000 UNIT/ML IJ SOLN: 10000.0000 [IU] | INTRAMUSCULAR | Status: DC

## 2015-05-31 MED ORDER — HYDRALAZINE HCL 50 MG PO TABS
100.0000 mg | ORAL_TABLET | Freq: Three times a day (TID) | ORAL | Status: DC
  Administered 2015-05-31 – 2015-06-06 (×14): 100 mg via ORAL
  Filled 2015-05-31 (×14): qty 2

## 2015-05-31 MED ORDER — EPOETIN ALFA 10000 UNIT/ML IJ SOLN
10000.0000 [IU] | INTRAMUSCULAR | Status: DC
  Administered 2015-06-01 – 2015-06-06 (×3): 10000 [IU] via INTRAVENOUS
  Filled 2015-05-31: qty 1

## 2015-05-31 NOTE — Care Management (Signed)
Patient from [facility].  Patient scheduled dialysis days Tuesday, Thursday, Saturday.  Patient must be able to sit in chair for dialysis prior to discharge back to [facility].  Note placed at head and foot of bed reminding staff if patient is stable for patient to go to HD in chair.

## 2015-05-31 NOTE — Progress Notes (Signed)
[facility] [place] [facility]   PATIENT NAME: Kristina GoltzJacquelyn Wilson    MR#:  [id number]  DATE OF BIRTH:  August 22, 1922  SUBJECTIVE:  CHIEF COMPLAINT:   Chief Complaint  Patient presents with  . Altered Mental Status   Patient here due to altered mental status and lethargy and also noted to have a urinary tract infection. Still quite lethargic this a.m. Granddaughter at bedside.  Afebrile.    REVIEW OF SYSTEMS:    Review of Systems  Unable to perform ROS: mental status change   Nutrition: Dysphagia 1 Tolerating Diet: Poor Tolerating PT:  Bedbound at baseline.    DRUG ALLERGIES:   Allergies  Allergen Reactions  . Enalapril Anaphylaxis  . Tramadol Shortness Of Breath  . Capsaicin Other (See Comments)    Reaction:  Unknown   . Codeine Other (See Comments)    Reaction:  Unknown   . Hydrocodone Other (See Comments)    Reaction:  Unknown   . Oxycodone Other (See Comments)    Reaction:  Unknown   . Vicodin [Hydrocodone-Acetaminophen] Other (See Comments)    Reaction:  Unknown     VITALS:  Blood pressure 159/46, pulse 73, temperature 98.3 F (36.8 C), temperature source Oral, resp. rate 16, height 5\' 4"  (1.626 m), weight 89.812 kg (198 lb), SpO2 100 %.  PHYSICAL EXAMINATION:   Physical Exam  GENERAL:  81 y.o.-year-old patient lying in the bed lethargic but responsive to verbal stimuli.Marland Kitchen.  EYES: Pupils equal, round, reactive to light and accommodation. No scleral icterus. Extraocular muscles intact.  HEENT: Head atraumatic, normocephalic. Oropharynx and nasopharynx clear.  NECK:  Supple, no jugular venous distention. No thyroid enlargement, no tenderness.  LUNGS: Poor Resp. effort, no wheezing, rales, rhonchi. No use of accessory muscles of respiration.  CARDIOVASCULAR: S1, S2 normal. No murmurs, rubs, or gallops.  ABDOMEN: Soft, nontender, nondistended. Bowel sounds present. No organomegaly or mass.  EXTREMITIES: No cyanosis, clubbing.  +1  dependent edema b/l.     NEUROLOGIC: Cranial nerves II through XII are intact. No focal Motor or sensory deficits b/l.  Globally weak PSYCHIATRIC: The patient is alert and oriented x 1.  SKIN: No obvious rash, lesion, or ulcer.   Right Subclavian Perm-cath for dialysis.    LABORATORY PANEL:   CBC  Recent Labs Lab 05/31/15 0334  WBC 8.0  HGB 8.2*  HCT 25.1*  PLT 352   ------------------------------------------------------------------------------------------------------------------  Chemistries   Recent Labs Lab 05/29/15 1303 05/30/15 0407 05/31/15 0334  NA 133* 133*  --   K 4.7 2.8* 3.1*  CL 94* 94*  --   CO2 27 33*  --   GLUCOSE 106* 92  --   BUN 10 15  --   CREATININE 1.75* 1.88*  --   CALCIUM 8.9 8.9  --   AST 38  --   --   ALT 10*  --   --   ALKPHOS 96  --   --   BILITOT 1.0  --   --    ------------------------------------------------------------------------------------------------------------------  Cardiac Enzymes  Recent Labs Lab 05/29/15 1303  TROPONINI 0.05*   ------------------------------------------------------------------------------------------------------------------  RADIOLOGY:  Dg Chest 2 View  05/29/2015  CLINICAL DATA:  Nonverbal today which is unusual for the patient, history of CHF,, chronic renal insufficiency on dialysis, previous CVA EXAM: CHEST  2 VIEW COMPARISON:  Portable chest x-ray of May 09, 2015 FINDINGS: The lungs are hypoinflated. There are moderate-sized bilateral pleural effusions layering posteriorly. The cardiac silhouette is enlarged. The  central pulmonary vascularity is engorged. The pulmonary interstitial markings are mildly increased. The dialysis type catheter has its tip projecting at the cavoatrial junction. The observed bony thorax exhibits no acute abnormality. IMPRESSION: CHF with pulmonary interstitial edema and moderate-sized bilateral pleural effusions layering posteriorly. There has been interval  deterioration since the previous study. Electronically Signed   By: David  [place] M.D.   On: 05/29/2015 13:51   Ct Head Wo Contrast  05/29/2015  CLINICAL DATA:  81 year old female with lethargy and altered mental status. Initial encounter. EXAM: CT HEAD WITHOUT CONTRAST TECHNIQUE: Contiguous axial images were obtained from the base of the skull through the vertex without intravenous contrast. COMPARISON:  05/08/2015 and earlier. FINDINGS: New bilateral mastoid effusions and middle ear opacification. Negative visualized pharynx. Stable paranasal sinuses, trace mucosal thickening. Osteopenia. No acute osseous abnormality identified. Stable orbit and scalp soft tissues. Calcified atherosclerosis at the skull base. Stable cerebral volume. No midline shift, mass effect, or evidence of intracranial mass lesion. No ventriculomegaly. Scattered dural calcifications. Stable gray-white matter differentiation throughout the brain. No cortically based acute infarct identified. No acute intracranial hemorrhage identified. No suspicious intracranial vascular hyperdensity. IMPRESSION: 1. New bilateral middle ear and mastoid fluid/inflammation. 2. Otherwise no acute intracranial abnormality. Stable non contrast CT appearance of the brain. Electronically Signed   By: Odessa Fleming M.D.   On: 05/29/2015 13:37     ASSESSMENT AND PLAN:   81 year old female with past medical history of end-stage renal disease on hemodialysis, hypertension, history of seizures, CHF, GERD, history of previous CVA, chronic anemia, who presented to the hospital due to altered mental status and noted to have urinary tract infection.  #1 altered mental status-etiology unclear but suspected to be metabolic in nature secondary to UTI. -Patient's CT head on admission was negative. No evidence of seizures/Post-ictal state.  Dilantin level subtherapeutic - cont. IV Ceftriaxone for UTI and follow mental status. Mental status still not back to baseline. ABG  obtained to showing some hypercarbia but normal pH. Cont. Cpap at bedtime.   #2 urinary tract infection-continue IV ceftriaxone.  - urine cultures pending.   #3 end-stage renal disease on hemodialysis-nephrology following continue dialysis on her scheduled days. - dialysis Tu, thurs, Sat.    #4 history of seizures-no acute seizure type activity. Continue Dilantin.  - dilantin level subtherapeutic.   #5 hypertension-continue Coreg, amlodipine.  #6 neuropathy-continue Neurontin.  #7 glaucoma-continue Xalantan eye drops.   #8 GERD - cont. Protonix.    All the records are reviewed and case discussed with Care Management/Social Workerr. Management plans discussed with the patient, family and they are in agreement.  CODE STATUS: Full  DVT Prophylaxis: Heparin SQ  TOTAL TIME TAKING CARE OF THIS PATIENT: 25 minutes.   POSSIBLE D/C IN 2-3 DAYS, DEPENDING ON CLINICAL CONDITION.   Houston Siren M.D on 05/31/2015 at 12:57 PM  Between 7am to 6pm - Pager - [phone]  After 6pm go to [email or web address] - password [email or web address] [facility]  [facility]  Office  [phone]  CC: Primary care physician; Jaclyn Shaggy, MD

## 2015-05-31 NOTE — Progress Notes (Signed)
Spoke with Dr Imogene Burnhen regarding pt's low blood pressure.  MD stated to hold all BP meds this evening.  Cristela FeltHelen Iris Guidry, RN

## 2015-05-31 NOTE — Progress Notes (Signed)
Subjective:  Still quite debilitated. Requires assistance with feeding.  K up to 3.1 today.   Objective:  Vital signs in last 24 hours:  Temp:  [98.1 F (36.7 C)-98.5 F (36.9 C)] 98.3 F (36.8 C) (10/28 1148) Pulse Rate:  [36-86] 73 (10/28 1150) Resp:  [16-20] 16 (10/28 1148) BP: (112-205)/(26-82) 159/46 mmHg (10/28 1150) SpO2:  [93 %-100 %] 100 % (10/28 1148) Weight:  [89.812 kg (198 lb)] 89.812 kg (198 lb) (10/28 0600)  Weight change: -0.181 kg (-6.4 oz) Filed Weights   05/30/15 1035 05/30/15 1440 05/31/15 0600  Weight: 91.4 kg (201 lb 8 oz) 90.8 kg (200 lb 2.8 oz) 89.812 kg (198 lb)    Intake/Output: I/O last 3 completed shifts: In: 24 [IV Piggyback:50] Out: 500 [Other:500]   Intake/Output this shift:  Total I/O In: 15 [P.O.:15] Out: 0   Physical Exam: General: NAD, frail , elderly  Head:  Moist oral mucosal membranes  Eyes: Anicteric  Neck: Supple, trachea midline  Lungs:  Mild scattered rhonchi, normal effort  Heart: Irregular, no rubs  Abdomen:  Soft, nontender, +peritoneal dialysis catheter  Extremities: 1+ peripheral edema  Neurologic: Awake, will follow simple commands  Skin: No acute lesions  Access: PD catheter, R IJ Permcath placed 05/16/15.    Basic Metabolic Panel:  Recent Labs Lab 05/29/15 1303 05/30/15 0407 05/30/15 1250 05/31/15 0334  NA 133* 133*  --   --   K 4.7 2.8*  --  3.1*  CL 94* 94*  --   --   CO2 27 33*  --   --   GLUCOSE 106* 92  --   --   BUN 10 15  --   --   CREATININE 1.75* 1.88*  --   --   CALCIUM 8.9 8.9  --   --   PHOS  --   --  3.5  --     Liver Function Tests:  Recent Labs Lab 05/29/15 1303  AST 38  ALT 10*  ALKPHOS 96  BILITOT 1.0  PROT 6.1*  ALBUMIN 2.9*   No results for input(s): LIPASE, AMYLASE in the last 168 hours. No results for input(s): AMMONIA in the last 168 hours.  CBC:  Recent Labs Lab 05/29/15 1303 05/30/15 0407 05/31/15 0334  WBC 9.3 8.7 8.0  HGB 8.8* 7.6* 8.2*  HCT 26.8*  22.8* 25.1*  MCV 95.8 95.5 95.3  PLT 361 348 352    Cardiac Enzymes:  Recent Labs Lab 05/29/15 1303  TROPONINI 0.05*    BNP: Invalid input(s): POCBNP  CBG:  Recent Labs Lab 05/30/15 0727 05/31/15 0730  GLUCAP 91 89    Microbiology: Results for orders placed or performed during the hospital encounter of 05/29/15  Urine culture     Status: None   Collection Time: 05/29/15 12:00 PM  Result Value Ref Range Status   Specimen Description URINE, CATHETERIZED  Final   Special Requests NONE  Final   Culture MULTIPLE SPECIES PRESENT, SUGGEST RECOLLECTION  Final   Report Status 05/31/2015 FINAL  Final    Coagulation Studies:  Recent Labs  05/29/15 1413  LABPROT 13.8  INR 1.04    Urinalysis:  Recent Labs  05/29/15 1413  COLORURINE YELLOW*  LABSPEC 1.021  PHURINE 5.0  GLUCOSEU 50*  HGBUR NEGATIVE  BILIRUBINUR NEGATIVE  KETONESUR NEGATIVE  PROTEINUR 100*  NITRITE NEGATIVE  LEUKOCYTESUR 2+*      Imaging: No results found.   Medications:     . amLODipine  10 mg Oral  QPC supper  . aspirin EC  81 mg Oral Daily  . carvedilol  12.5 mg Oral Q12H  . cefTRIAXone (ROCEPHIN)  IV  1 g Intravenous Q12H  . cloNIDine  0.1 mg Oral 3 times per day  . cyanocobalamin  500 mcg Oral Daily  . docusate sodium  100 mg Oral BID  . famotidine  10 mg Oral Daily  . feeding supplement (NEPRO CARB STEADY)  237 mL Oral BID WC  . fluticasone  2 spray Each Nare Daily  . furosemide  40 mg Oral Daily  . gabapentin  200 mg Oral 3 times per day  . heparin  5,000 Units Subcutaneous 3 times per day  . hydrALAZINE  100 mg Oral TID  . iron polysaccharides  150 mg Oral Daily  . latanoprost  1 drop Both Eyes QHS  . lidocaine  1 patch Transdermal Q24H  . loratadine  10 mg Oral Daily  . multivitamin with minerals  1 tablet Oral Daily  . multivitamin-lutein  1 capsule Oral BID  . pantoprazole  40 mg Oral BID  . phenytoin  300 mg Oral QHS  . predniSONE  10 mg Oral Daily  . sodium  bicarbonate  1,300 mg Oral BID  . sodium chloride  3 mL Intravenous Q12H   sodium chloride, sodium chloride, acetaminophen, alteplase, alum & mag hydroxide-simeth, bisacodyl, heparin, lidocaine (PF), lidocaine-prilocaine, ondansetron **OR** ondansetron (ZOFRAN) IV, pentafluoroprop-tetrafluoroeth, promethazine, sodium chloride  Assessment/ Plan:  [name] with past medical history of hypertension, obstructive sleep apnea, DJD, CVA with residual left sided weakness, carotid stenosis status post L CEA 2/15, seizure disorder, diastolic congestive heart failure, anemia  1. End Stage Renal Disease with proteinuria: progression of disease from Chronic kidney disease stage V. 24 hour creatinine clearance 12. Initiated on hemodialysis on 10/8 through right femoral temp HD catheter. R IJ permcath placed 05/16/15.  [facility] -Pt had HD yesterday, will plan for HD again tomorrow.  2. Hypertension. Has history of labile blood pressure.Very difficult time treating hypertension and avoiding side effects.   Allergy to enalapril (causes anaphylaxis). -  BP 159/46 at last check, continue amlodipine, coreg, clonidine, hydralazine at this time.  3. Secondary hyperparathyroidism. Check phos and PTH with HD today.  4. Anemia chronic kidney disease: administer epogen 10000 units IV with HD.  5.  Altered mental status/UTI:  Thought to be secondary to UTI, being treated with ceftriaxone.   LOS: 2  Kristina Wilson 10/28/20163:43 PM

## 2015-05-31 NOTE — Progress Notes (Signed)
Initial Nutrition Assessment    INTERVENTION:   Medical Food Supplement Therapy: pt receiving Nepro on recent admission; recommend sending Nepro Shake po BID, each supplement provides 425 kcal and 19 grams protein. Will also try Magic Cup BID on meal trays Feeding Assistance: pt requires feeding assistance and encouragement at meal times  NUTRITION DIAGNOSIS:   Inadequate oral intake related to acute illness as evidenced by meal completion < 50%.  GOAL:   Patient will meet greater than or equal to 90% of their needs  MONITOR:    (Energy Intake, Anthropometrics, Digestive System, Electrolyte/Renal Profile)  REASON FOR ASSESSMENT:   Malnutrition Screening Tool    ASSESSMENT:    Pt admitted with AMS, lethargy with UTI; pt arousable on visit today but kept repeating "coughing, I'm coughing," not able to answer questions appropriately. No family at bedside  Past Medical History  Diagnosis Date  . Muscle weakness   . Chronic respiratory failure (HCC)   . Pneumonia   . Sleep apnea   . CHF (congestive heart failure) (HCC)   . CKD (chronic kidney disease), stage IV (HCC)   . Dysphagia   . GERD (gastroesophageal reflux disease)   . Back pain   . Epilepsy (HCC)   . Hypertension   . Stroke (HCC)   . Headache   . Arthritis   . Anemia     iron deficiency anemia  . Complication of anesthesia     difficult waking up after surgery (2013)    Diet Order:  DIET - DYS 1 Room service appropriate?: Yes; Fluid consistency:: Thin   Energy Intake: pt requires feeding assistance, ate 35% at lunch today.   Food and Nutrition related history: unable to assess  Electrolyte and Renal Profile:  Recent Labs Lab 05/29/15 1303 05/30/15 0407 05/30/15 1250 05/31/15 0334  BUN 10 15  --   --   CREATININE 1.75* 1.88*  --   --   NA 133* 133*  --   --   K 4.7 2.8*  --  3.1*  PHOS  --   --  3.5  --    Glucose Profile:  Recent Labs  05/30/15 0727 05/31/15 0730  GLUCAP 91 89   Meds:  lasix, MVI, prednisone  Skin:   (stage II pressure ulcer on sacrum)   Nutrition Focused Physical Exam:  Unable to complete Nutrition-Focused physical exam at this time.    Height:   Ht Readings from Last 1 Encounters:  05/29/15 5\' 4"  (1.626 m)    Weight: weight is trending down; unsure of accuracy of wt trend. Pt dialysis patient  Wt Readings from Last 1 Encounters:  05/31/15 198 lb (89.812 kg)    Wt Readings from Last 10 Encounters:  05/31/15 198 lb (89.812 kg)  05/21/15 201 lb 8 oz (91.4 kg)  04/12/15 203 lb (92.08 kg)  04/01/15 203 lb (92.08 kg)  03/16/15 207 lb 12.8 oz (94.257 kg)  03/03/15 205 lb (92.987 kg)  02/02/15 208 lb (94.348 kg)  01/09/15 208 lb (94.348 kg)  12/27/14 208 lb (94.348 kg)  12/07/14 218 lb (98.884 kg)    BMI:  Body mass index is 33.97 kg/(m^2).  Estimated Nutritional Needs:   Kcal:  1255-1484 kcals using IBW 54.6 kg (AF 1.2, IF 1.1-1.3)   Protein:  65-83 g (1.2-1.5 g/kg)   Fluid:  1000 mL plus UOP  MODERATE Care Level  [facility] SpartanburgCate Dacen Frayre MS, RD, LDN [phone] Pager

## 2015-06-01 ENCOUNTER — Inpatient Hospital Stay: Payer: Medicare Other

## 2015-06-01 LAB — RENAL FUNCTION PANEL
ANION GAP: 6 (ref 5–15)
Albumin: 2.5 g/dL — ABNORMAL LOW (ref 3.5–5.0)
BUN: 16 mg/dL (ref 6–20)
CALCIUM: 9.2 mg/dL (ref 8.9–10.3)
CO2: 35 mmol/L — AB (ref 22–32)
CREATININE: 1.64 mg/dL — AB (ref 0.44–1.00)
Chloride: 95 mmol/L — ABNORMAL LOW (ref 101–111)
GFR, EST AFRICAN AMERICAN: 31 mL/min — AB (ref >60)
GFR, EST NON AFRICAN AMERICAN: 26 mL/min — AB (ref >60)
Glucose, Bld: 182 mg/dL — ABNORMAL HIGH (ref 65–99)
PHOSPHORUS: 3.3 mg/dL (ref 2.5–4.6)
Potassium: 4.1 mmol/L (ref 3.5–5.1)
SODIUM: 136 mmol/L (ref 135–145)

## 2015-06-01 LAB — URINALYSIS COMPLETE WITH MICROSCOPIC (ARMC ONLY)
Bacteria, UA: NONE SEEN
Bilirubin Urine: NEGATIVE
GLUCOSE, UA: 50 mg/dL — AB
Hgb urine dipstick: NEGATIVE
KETONES UR: NEGATIVE mg/dL
LEUKOCYTES UA: NEGATIVE
Nitrite: NEGATIVE
Protein, ur: 100 mg/dL — AB
RBC / HPF: NONE SEEN RBC/hpf (ref 0–5)
SPECIFIC GRAVITY, URINE: 1.014 (ref 1.005–1.030)
SQUAMOUS EPITHELIAL / LPF: NONE SEEN
pH: 5 (ref 5.0–8.0)

## 2015-06-01 LAB — POTASSIUM: Potassium: 3.2 mmol/L — ABNORMAL LOW (ref 3.5–5.1)

## 2015-06-01 LAB — CBC
HCT: 26.2 % — ABNORMAL LOW (ref 35.0–47.0)
HEMOGLOBIN: 8.7 g/dL — AB (ref 12.0–16.0)
MCH: 31.6 pg (ref 26.0–34.0)
MCHC: 33 g/dL (ref 32.0–36.0)
MCV: 95.9 fL (ref 80.0–100.0)
PLATELETS: 367 10*3/uL (ref 150–440)
RBC: 2.74 MIL/uL — AB (ref 3.80–5.20)
RDW: 13.3 % (ref 11.5–14.5)
WBC: 7.6 10*3/uL (ref 3.6–11.0)

## 2015-06-01 LAB — TSH: TSH: 3.113 u[IU]/mL (ref 0.350–4.500)

## 2015-06-01 LAB — GLUCOSE, CAPILLARY: GLUCOSE-CAPILLARY: 93 mg/dL (ref 65–99)

## 2015-06-01 LAB — MAGNESIUM: Magnesium: 1.9 mg/dL (ref 1.7–2.4)

## 2015-06-01 LAB — CK: CK TOTAL: 9 U/L — AB (ref 38–234)

## 2015-06-01 MED ORDER — OXYCODONE HCL 5 MG PO TABS
5.0000 mg | ORAL_TABLET | Freq: Four times a day (QID) | ORAL | Status: DC | PRN
  Administered 2015-06-01 – 2015-06-06 (×12): 5 mg via ORAL
  Filled 2015-06-01 (×12): qty 1

## 2015-06-01 MED ORDER — EPOETIN ALFA 10000 UNIT/ML IJ SOLN
10000.0000 [IU] | Freq: Once | INTRAMUSCULAR | Status: AC
  Administered 2015-06-01: 10000 [IU] via INTRAVENOUS

## 2015-06-01 MED ORDER — MAGNESIUM SULFATE 2 GM/50ML IV SOLN
2.0000 g | Freq: Once | INTRAVENOUS | Status: AC
  Administered 2015-06-01: 2 g via INTRAVENOUS
  Filled 2015-06-01: qty 50

## 2015-06-01 MED ORDER — POTASSIUM CHLORIDE CRYS ER 20 MEQ PO TBCR
40.0000 meq | EXTENDED_RELEASE_TABLET | Freq: Once | ORAL | Status: AC
  Administered 2015-06-01: 40 meq via ORAL
  Filled 2015-06-01: qty 2

## 2015-06-01 NOTE — Progress Notes (Signed)
Pt placed on hospital Cpap with home nasal tubing and mask. Pt tolerating well. 6L O2 bled in line with tubing.

## 2015-06-01 NOTE — Progress Notes (Signed)
Patient daughter stated that pt's has not voided since last night and bladder scan done with 309 ml reading , dr writing made aware and order for in and out cath with specimen sent to the labs for UA &CS . Will continue to monitor

## 2015-06-01 NOTE — Progress Notes (Signed)
[facility] Patient off floor for hemodialysis - per RN, patient went down in the chair because of outpatient requirements.

## 2015-06-01 NOTE — Progress Notes (Signed)
Subjective:   Hemodialysis for later today. Has to be reoriented  Considerably debilitated.   Objective:  Vital signs in last 24 hours:  Temp:  [97.5 F (36.4 C)-98.3 F (36.8 C)] 97.5 F (36.4 C) (10/29 1115) Pulse Rate:  [63-78] 78 (10/29 1115) Resp:  [16-22] 22 (10/29 1115) BP: (112-166)/(42-51) 166/49 mmHg (10/29 1115) SpO2:  [93 %-99 %] 99 % (10/29 1115) Weight:  [99.746 kg (219 lb 14.4 oz)] 99.746 kg (219 lb 14.4 oz) (10/29 0458)  Weight change: 8.346 kg (18 lb 6.4 oz) Filed Weights   05/30/15 1440 05/31/15 0600 06/01/15 0458  Weight: 90.8 kg (200 lb 2.8 oz) 89.812 kg (198 lb) 99.746 kg (219 lb 14.4 oz)    Intake/Output: I/O last 3 completed shifts: In: 305 [P.O.:255; IV Piggyback:50] Out: 0    Intake/Output this shift:  Total I/O In: 100 [IV Piggyback:100] Out: 400 [Urine:400]  Physical Exam: General: NAD, frail , elderly  Head:  Moist oral mucosal membranes  Eyes: Anicteric  Neck: Supple, trachea midline  Lungs:  Mild scattered rhonchi, normal effort  Heart: Irregular, no rubs  Abdomen:  Soft, nontender, +peritoneal dialysis catheter  Extremities: 1+ peripheral edema  Neurologic: Awake, will follow simple commands  Skin: No acute lesions  Access: PD catheter, R IJ Permcath placed 05/16/15.    Basic Metabolic Panel:  Recent Labs Lab 05/29/15 1303 05/30/15 0407 05/30/15 1250 05/31/15 0334 06/01/15 0601  NA 133* 133*  --   --   --   K 4.7 2.8*  --  3.1* 3.2*  CL 94* 94*  --   --   --   CO2 27 33*  --   --   --   GLUCOSE 106* 92  --   --   --   BUN 10 15  --   --   --   CREATININE 1.75* 1.88*  --   --   --   CALCIUM 8.9 8.9  --   --   --   MG  --   --   --   --  1.9  PHOS  --   --  3.5  --   --     Liver Function Tests:  Recent Labs Lab 05/29/15 1303  AST 38  ALT 10*  ALKPHOS 96  BILITOT 1.0  PROT 6.1*  ALBUMIN 2.9*   No results for input(s): LIPASE, AMYLASE in the last 168 hours. No results for input(s): AMMONIA in the last 168  hours.  CBC:  Recent Labs Lab 05/29/15 1303 05/30/15 0407 05/31/15 0334 06/01/15 0601  WBC 9.3 8.7 8.0 7.6  HGB 8.8* 7.6* 8.2* 8.7*  HCT 26.8* 22.8* 25.1* 26.2*  MCV 95.8 95.5 95.3 95.9  PLT 361 348 352 367    Cardiac Enzymes:  Recent Labs Lab 05/29/15 1303 06/01/15 0601  CKTOTAL  --  9*  TROPONINI 0.05*  --     BNP: Invalid input(s): POCBNP  CBG:  Recent Labs Lab 05/30/15 0727 05/31/15 0730 06/01/15 0759  GLUCAP 91 89 93    Microbiology: Results for orders placed or performed during the hospital encounter of 05/29/15  Urine culture     Status: None   Collection Time: 05/29/15 12:00 PM  Result Value Ref Range Status   Specimen Description URINE, CATHETERIZED  Final   Special Requests NONE  Final   Culture MULTIPLE SPECIES PRESENT, SUGGEST RECOLLECTION  Final   Report Status 05/31/2015 FINAL  Final    Coagulation Studies:  Recent Labs  05/29/15 1413  LABPROT 13.8  INR 1.04    Urinalysis:  Recent Labs  05/29/15 1413 06/01/15 1000  COLORURINE YELLOW* YELLOW*  LABSPEC 1.021 1.014  PHURINE 5.0 5.0  GLUCOSEU 50* 50*  HGBUR NEGATIVE NEGATIVE  BILIRUBINUR NEGATIVE NEGATIVE  KETONESUR NEGATIVE NEGATIVE  PROTEINUR 100* 100*  NITRITE NEGATIVE NEGATIVE  LEUKOCYTESUR 2+* NEGATIVE      Imaging: No results found.   Medications:     . amLODipine  10 mg Oral QPC supper  . aspirin EC  81 mg Oral Daily  . carvedilol  12.5 mg Oral Q12H  . cefTRIAXone (ROCEPHIN)  IV  1 g Intravenous Q12H  . cloNIDine  0.1 mg Oral 3 times per day  . cyanocobalamin  500 mcg Oral Daily  . docusate sodium  100 mg Oral BID  . epoetin (EPOGEN/PROCRIT) injection  10,000 Units Intravenous Q T,Th,Sa-HD  . epoetin (EPOGEN/PROCRIT) injection  10,000 Units Intravenous Once  . famotidine  10 mg Oral Daily  . feeding supplement (NEPRO CARB STEADY)  237 mL Oral BID WC  . fluticasone  2 spray Each Nare Daily  . furosemide  40 mg Oral Daily  . gabapentin  200 mg Oral 3  times per day  . heparin  5,000 Units Subcutaneous 3 times per day  . hydrALAZINE  100 mg Oral TID  . iron polysaccharides  150 mg Oral Daily  . latanoprost  1 drop Both Eyes QHS  . lidocaine  1 patch Transdermal Q24H  . loratadine  10 mg Oral Daily  . multivitamin with minerals  1 tablet Oral Daily  . multivitamin-lutein  1 capsule Oral BID  . pantoprazole  40 mg Oral BID  . phenytoin  300 mg Oral QHS  . predniSONE  10 mg Oral Daily  . sodium bicarbonate  1,300 mg Oral BID  . sodium chloride  3 mL Intravenous Q12H   sodium chloride, sodium chloride, acetaminophen, alteplase, alum & mag hydroxide-simeth, bisacodyl, heparin, lidocaine (PF), lidocaine-prilocaine, ondansetron **OR** ondansetron (ZOFRAN) IV, oxyCODONE, pentafluoroprop-tetrafluoroeth, promethazine, sodium chloride  Assessment/ Plan:  Kristina Wilson is a 81 y.o. white female with past medical history of hypertension, obstructive sleep apnea, DJD, CVA with residual left sided weakness, carotid stenosis status post L CEA 2/15, seizure disorder, diastolic congestive heart failure, anemia  [facility]  1. End Stage Renal Disease: plan for hemodialysis treatment later today. Continue TTS schedule.  Ultimate plan is for patient to transition to PD however patient is currently at a SNF and appears quite debilitated.  - discontinue sodium bicarbonate and potassium   2. Hypertension: elevated today as medications are being held before dialysis.  Allergy to enalapril (causes anaphylaxis). -  Continue current regimen: furosemide, hydralazine, clonidine, carvedilol and amlodipine.   3. Secondary hyperparathyroidism. PTH 100, phos 3.5, calcium 8.9 - no indication for binders or vitamin D agent.   4. Anemia chronic kidney disease: hemoglobin 8.7 - administer epogen 10000 units IV with HD. - iron supplements  5.  Urinary Tract infection with encephalopathy: empiric therapy with ceftriaxone, which in turn, is known  to cause altered mental status in dialysis patients.  - will discontinue gabapentin.    LOS: 3  [name] 10/29/20161:48 PM

## 2015-06-01 NOTE — Progress Notes (Signed)
Spoke with Dr Anne HahnWillis regarding pt have approx 15 sec run of idioventricular rhythm.  Pt had no symptoms.  MD states to continue to monitor.  Cristela FeltHelen Iris Guidry, RN

## 2015-06-01 NOTE — Progress Notes (Signed)
Patient ID: Kristina RhodesJacquelyn R Fontanilla, female   DOB: 1922-09-03, 81 y.o.   MRN: [id number] [facility] PROGRESS NOTE  PCP: Jaclyn ShaggyATE,DENNY C, MD  HPI/Subjective: Patient is more alert. Answers some yes or no questions but cannot elaborate. As per the daughter, she is normally very talkative and this is not herself. She keeps on repeating herself. She has not urinated all night.  Objective: Filed Vitals:   06/01/15 0458  BP: 165/46  Pulse: 63  Temp: 97.8 F (36.6 C)  Resp: 16    Filed Weights   05/30/15 1440 05/31/15 0600 06/01/15 0458  Weight: 90.8 kg (200 lb 2.8 oz) 89.812 kg (198 lb) 99.746 kg (219 lb 14.4 oz)    ROS: Review of Systems  Constitutional: Negative for fever and chills.  Eyes: Negative for blurred vision.  Respiratory: Negative for cough and shortness of breath.   Cardiovascular: Negative for chest pain.  Gastrointestinal: Negative for nausea, vomiting, abdominal pain, diarrhea and constipation.  Genitourinary: Negative for dysuria.  Musculoskeletal: Positive for joint pain.  Neurological: Negative for dizziness and headaches.   Exam: Physical Exam  Constitutional: She appears lethargic.  HENT:  Nose: No mucosal edema.  Mouth/Throat: No oropharyngeal exudate or posterior oropharyngeal edema.  Eyes: Conjunctivae, EOM and lids are normal. Pupils are equal, round, and reactive to light.  Neck: No JVD present. Carotid bruit is not present.  Cardiovascular: S1 normal and S2 normal.  Exam reveals no gallop.   No murmur heard. Pulses:      Dorsalis pedis pulses are 2+ on the right side, and 2+ on the left side.  Respiratory: No respiratory distress. She has no wheezes. She has no rhonchi. She has no rales.  GI: Soft. Bowel sounds are normal. There is no tenderness.  Musculoskeletal:       Right ankle: She exhibits swelling.       Left ankle: She exhibits swelling.  Lymphadenopathy:    She has no cervical adenopathy.  Neurological: She appears lethargic.   Patient unable to lift elbows up off the bed. Patient unable to straight leg raise. She is able to push down with her toes.  Skin: Skin is warm. No rash noted. Nails show no clubbing.  Bruising left knee.  Psychiatric: She has a normal mood and affect. Her speech is delayed.    Data Reviewed: Basic Metabolic Panel:  Recent Labs Lab 05/29/15 1303 05/30/15 0407 05/30/15 1250 05/31/15 0334 06/01/15 0601  NA 133* 133*  --   --   --   K 4.7 2.8*  --  3.1* 3.2*  CL 94* 94*  --   --   --   CO2 27 33*  --   --   --   GLUCOSE 106* 92  --   --   --   BUN 10 15  --   --   --   CREATININE 1.75* 1.88*  --   --   --   CALCIUM 8.9 8.9  --   --   --   MG  --   --   --   --  1.9  PHOS  --   --  3.5  --   --    Liver Function Tests:  Recent Labs Lab 05/29/15 1303  AST 38  ALT 10*  ALKPHOS 96  BILITOT 1.0  PROT 6.1*  ALBUMIN 2.9*  CBC:  Recent Labs Lab 05/29/15 1303 05/30/15 0407 05/31/15 0334  WBC 9.3 8.7 8.0  HGB 8.8* 7.6* 8.2*  HCT 26.8* 22.8* 25.1*  MCV 95.8 95.5 95.3  PLT 361 348 352   CBG:  Recent Labs Lab 05/30/15 0727 05/31/15 0730 06/01/15 0759  GLUCAP 91 89 93    Recent Results (from the past 240 hour(s))  Urine culture     Status: None   Collection Time: 05/29/15 12:00 PM  Result Value Ref Range Status   Specimen Description URINE, CATHETERIZED  Final   Special Requests NONE  Final   Culture MULTIPLE SPECIES PRESENT, SUGGEST RECOLLECTION  Final   Report Status 05/31/2015 FINAL  Final     Scheduled Meds: . amLODipine  10 mg Oral QPC supper  . aspirin EC  81 mg Oral Daily  . carvedilol  12.5 mg Oral Q12H  . cefTRIAXone (ROCEPHIN)  IV  1 g Intravenous Q12H  . cloNIDine  0.1 mg Oral 3 times per day  . cyanocobalamin  500 mcg Oral Daily  . docusate sodium  100 mg Oral BID  . epoetin (EPOGEN/PROCRIT) injection  10,000 Units Intravenous Q T,Th,Sa-HD  . famotidine  10 mg Oral Daily  . feeding supplement (NEPRO CARB STEADY)  237 mL Oral BID WC  .  fluticasone  2 spray Each Nare Daily  . furosemide  40 mg Oral Daily  . gabapentin  200 mg Oral 3 times per day  . heparin  5,000 Units Subcutaneous 3 times per day  . hydrALAZINE  100 mg Oral TID  . iron polysaccharides  150 mg Oral Daily  . latanoprost  1 drop Both Eyes QHS  . lidocaine  1 patch Transdermal Q24H  . loratadine  10 mg Oral Daily  . magnesium sulfate 1 - 4 g bolus IVPB  2 g Intravenous Once  . multivitamin with minerals  1 tablet Oral Daily  . multivitamin-lutein  1 capsule Oral BID  . pantoprazole  40 mg Oral BID  . phenytoin  300 mg Oral QHS  . predniSONE  10 mg Oral Daily  . sodium bicarbonate  1,300 mg Oral BID  . sodium chloride  3 mL Intravenous Q12H    Assessment/Plan:  1. Acute encephalopathy with lethargy. Thought initially to be secondary to acute cystitis with hematuria. Urine culture grew out multiple organisms which is a contaminant. I will get another urine analysis and urine culture with straight catheter and continue Rocephin at this point. This is not the patient's baseline mental status. I will get an MRI of the brain to rule out stroke. Patient on aspirin already. Check a TSH. 2. Acute cystitis with hematuria continue IV ceftriaxone and repeat urine culture. 3. End-stage renal disease on dialysis Tuesday Thursday and Saturday. 4. Seizure history. On Dilantin. Level subtherapeutic but no signs of seizure. 5. Essential hypertension. Blood pressure slightly high today on Coreg, amlodipine, clonidine and hydralazine 6. Neuropathy on Neurontin 7. Glaucoma unspecified on Xalatan 8. Gastroesophageal reflux disease without esophagitis on Protonix 9. Sleep apnea on nasal CPAP at night 10. Weakness will need PT eval 11. Anemia of chronic disease- on Procrit with dialysis. 12. Chronic pain - restart oral oxycodone. 13. Patient also on chronic prednisone.  Code Status:     Code Status Orders        Start     Ordered   05/29/15 1601  Full code    Continuous     05/29/15 1605    Advance Directive Documentation        Most Recent Value   Type of Advance Directive  Healthcare Power of [place]  [place], daughter]   Pre-existing out of facility DNR order (yellow form or pink MOST form)     "MOST" Form in Place?       Family Communication: daughter at bedside  Disposition Plan: to be determined  Time spent: 28 minutes  Laroque Highland  [facility]

## 2015-06-01 NOTE — Progress Notes (Signed)
Removed old fentanyl patch from patient's shoulder.  Dated 05/28/15.  None reordered due to patient's lethargy at this time.  Witnessed by Crisoforo Oxfordharlotte Kyei, RN.  Disposed in sharps container.  Cristela FeltHelen Iris Guidry, RN

## 2015-06-01 NOTE — Progress Notes (Signed)
Pt. Returned from dialysis and was returned to bed from chair. Pt. A&O. No signs or c/o of pain at this time. No discomfort or acute distress noted. Will continue to monitor pt.

## 2015-06-02 ENCOUNTER — Inpatient Hospital Stay: Payer: Medicare Other

## 2015-06-02 LAB — BASIC METABOLIC PANEL
Anion gap: 3 — ABNORMAL LOW (ref 5–15)
BUN: 9 mg/dL (ref 6–20)
CHLORIDE: 99 mmol/L — AB (ref 101–111)
CO2: 37 mmol/L — AB (ref 22–32)
CREATININE: 1.17 mg/dL — AB (ref 0.44–1.00)
Calcium: 9 mg/dL (ref 8.9–10.3)
GFR calc Af Amer: 46 mL/min — ABNORMAL LOW (ref >60)
GFR calc non Af Amer: 40 mL/min — ABNORMAL LOW (ref >60)
GLUCOSE: 92 mg/dL (ref 65–99)
Potassium: 3.5 mmol/L (ref 3.5–5.1)
SODIUM: 139 mmol/L (ref 135–145)

## 2015-06-02 LAB — GLUCOSE, CAPILLARY: Glucose-Capillary: 95 mg/dL (ref 65–99)

## 2015-06-02 LAB — HEMOGLOBIN: Hemoglobin: 7.8 g/dL — ABNORMAL LOW (ref 12.0–16.0)

## 2015-06-02 MED ORDER — IPRATROPIUM-ALBUTEROL 0.5-2.5 (3) MG/3ML IN SOLN
3.0000 mL | RESPIRATORY_TRACT | Status: DC | PRN
  Administered 2015-06-02 – 2015-06-03 (×2): 3 mL via RESPIRATORY_TRACT
  Filled 2015-06-02 (×2): qty 3

## 2015-06-02 NOTE — Progress Notes (Signed)
Pt. Requested C-pap be removed and to wear nasal cannula, nasal cannula applied. Will continue to monitor pt.

## 2015-06-02 NOTE — Progress Notes (Signed)
Clinical Social worker [facility] by [facility] that [facility] can no longer make a bed offer for patient.  Call to patient's daughter Tresa EndoKelly [phone] cell or [phone] to provide and update.  Daughter would like patient to return to [facility].    CSW will send FL2 to [facility] in anticipation of patient returning at discharge   Sammuel Hineseborah Lazara Grieser. Theresia MajorsLCSWA, MSW Clinical Social Work Department [phone] 2:36 PM

## 2015-06-02 NOTE — Progress Notes (Signed)
Patient ID: ANALISSE Wilson, female   DOB: Oct 19, 1922, 81 y.o.   MRN: [id number] [facility] PROGRESS NOTE  PCP: Kristina Shaggy, MD  HPI/Subjective: Patient last evening became more alert. She has been alert this morning. I had to wake her up no. She answered questions appropriately. She is confused about the date.  Objective: Filed Vitals:   06/02/15 0603  BP: 135/39  Pulse: 66  Temp: 97.7 F (36.5 C)  Resp:     Filed Weights   05/31/15 0600 06/01/15 0458 06/02/15 0500  Weight: 89.812 kg (198 lb) 99.746 kg (219 lb 14.4 oz) 92.761 kg (204 lb 8 oz)    ROS: Review of Systems  Constitutional: Negative for fever and chills.  Eyes: Negative for blurred vision.  Respiratory: Negative for cough and shortness of breath.   Cardiovascular: Negative for chest pain.  Gastrointestinal: Negative for nausea, vomiting, abdominal pain, diarrhea and constipation.  Genitourinary: Negative for dysuria.  Musculoskeletal: Positive for joint pain.  Neurological: Negative for dizziness and headaches.   Exam: Physical Exam  HENT:  Nose: No mucosal edema.  Mouth/Throat: No oropharyngeal exudate or posterior oropharyngeal edema.  Eyes: Conjunctivae, EOM and lids are normal. Pupils are equal, round, and reactive to light.  Neck: No JVD present. Carotid bruit is not present.  Cardiovascular: S1 normal and S2 normal.  Exam reveals no gallop.   No murmur heard. Pulses:      Dorsalis pedis pulses are 2+ on the right side, and 2+ on the left side.  Respiratory: No respiratory distress. She has no wheezes. She has no rhonchi. She has no rales.  GI: Soft. Bowel sounds are normal. There is no tenderness.  Musculoskeletal:       Right ankle: She exhibits swelling.       Left ankle: She exhibits swelling.  Lymphadenopathy:    She has no cervical adenopathy.  Neurological: She is alert.  Patient able to lift up her arms up off the bed.  Skin: Skin is warm. No rash noted. Nails show no  clubbing.  Bruising left knee.  Psychiatric: She has a normal mood and affect.    Data Reviewed: Basic Metabolic Panel:  Recent Labs Lab 05/29/15 1303 05/30/15 0407 05/30/15 1250 05/31/15 0334 06/01/15 0601 06/01/15 1554 06/02/15 0542  NA 133* 133*  --   --   --  136 139  K 4.7 2.8*  --  3.1* 3.2* 4.1 3.5  CL 94* 94*  --   --   --  95* 99*  CO2 27 33*  --   --   --  35* 37*  GLUCOSE 106* 92  --   --   --  182* 92  BUN 10 15  --   --   --  16 9  CREATININE 1.75* 1.88*  --   --   --  1.64* 1.17*  CALCIUM 8.9 8.9  --   --   --  9.2 9.0  MG  --   --   --   --  1.9  --   --   PHOS  --   --  3.5  --   --  3.3  --    Liver Function Tests:  Recent Labs Lab 05/29/15 1303 06/01/15 1554  AST 38  --   ALT 10*  --   ALKPHOS 96  --   BILITOT 1.0  --   PROT 6.1*  --   ALBUMIN 2.9* 2.5*  CBC:  Recent Labs  Lab 05/29/15 1303 05/30/15 0407 05/31/15 0334 06/01/15 0601 06/02/15 0542  WBC 9.3 8.7 8.0 7.6  --   HGB 8.8* 7.6* 8.2* 8.7* 7.8*  HCT 26.8* 22.8* 25.1* 26.2*  --   MCV 95.8 95.5 95.3 95.9  --   PLT 361 348 352 367  --    CBG:  Recent Labs Lab 05/30/15 0727 05/31/15 0730 06/01/15 0759 06/02/15 0759  GLUCAP 91 89 93 95    Recent Results (from the past 240 hour(s))  Urine culture     Status: None   Collection Time: 05/29/15 12:00 PM  Result Value Ref Range Status   Specimen Description URINE, CATHETERIZED  Final   Special Requests NONE  Final   Culture MULTIPLE SPECIES PRESENT, SUGGEST RECOLLECTION  Final   Report Status 05/31/2015 FINAL  Final     Scheduled Meds: . amLODipine  10 mg Oral QPC supper  . aspirin EC  81 mg Oral Daily  . carvedilol  12.5 mg Oral Q12H  . cefTRIAXone (ROCEPHIN)  IV  1 g Intravenous Q12H  . cloNIDine  0.1 mg Oral 3 times per day  . cyanocobalamin  500 mcg Oral Daily  . docusate sodium  100 mg Oral BID  . epoetin (EPOGEN/PROCRIT) injection  10,000 Units Intravenous Q T,Th,Sa-HD  . famotidine  10 mg Oral Daily  . feeding  supplement (NEPRO CARB STEADY)  237 mL Oral BID WC  . fluticasone  2 spray Each Nare Daily  . furosemide  40 mg Oral Daily  . heparin  5,000 Units Subcutaneous 3 times per day  . hydrALAZINE  100 mg Oral TID  . iron polysaccharides  150 mg Oral Daily  . latanoprost  1 drop Both Eyes QHS  . lidocaine  1 patch Transdermal Q24H  . loratadine  10 mg Oral Daily  . multivitamin with minerals  1 tablet Oral Daily  . multivitamin-lutein  1 capsule Oral BID  . pantoprazole  40 mg Oral BID  . phenytoin  300 mg Oral QHS  . predniSONE  10 mg Oral Daily  . sodium chloride  3 mL Intravenous Q12H    Assessment/Plan:  1. Acute encephalopathy with lethargy. Thought initially to be secondary to acute cystitis with hematuria. Urine culture grew out multiple organisms which is a contaminant. I will get another urine analysis and urine culture with straight catheter and continue Rocephin at this point. I will cancel MRI of the brain at this point since the patient is doing better. Patient is answering questions appropriately and following commands. This could be polypharmacy. Pain patch and gabapentin and Skelaxin removed. Patient also retains CO2. I would keep the patient's oxygen at 2.5 L at the most. 2. Acute cystitis with hematuria continue IV ceftriaxone and repeated urine culture yesterday. 3. End-stage renal disease on dialysis Tuesday Thursday and Saturday. 4. Seizure history. On Dilantin. Level subtherapeutic but no signs of seizure. 5. Essential hypertension. Blood pressure stable on Coreg, amlodipine, clonidine and hydralazine 6. Neuropathy - gabapentin stopped 7. Glaucoma unspecified on Xalatan 8. Gastroesophageal reflux disease without esophagitis on Protonix 9. Sleep apnea on nasal CPAP at night 10. Weakness - PT recommended rehabilitation 11. Anemia of chronic disease- on Procrit with dialysis. 12. Chronic pain - restart oral oxycodone. 13. Patient also on chronic prednisone.  Code Status:      Code Status Orders        Start     Ordered   05/29/15 1601  Full code   Continuous  05/29/15 1605    Advance Directive Documentation        Most Recent Value   Type of Advance Directive  Healthcare Power of [facility] [[place], daughter]   Pre-existing out of facility DNR order (yellow form or pink MOST form)     "MOST" Form in Place?       Family Communication: daughter and granddaughter at bedside  Disposition Plan: to rehabilitation this week coming up.  Time spent: 28 minutes  Frayre HighlandWIETING, Alem Fahl  [facility]

## 2015-06-02 NOTE — Progress Notes (Signed)
Subjective:   Hemodialysis yesterday. Tolerated treatment well. 3 hours. UF of 1 litre  Granddaughter at bedside.  Patient states she is feeling better.   Objective:  Vital signs in last 24 hours:  Temp:  [97.5 F (36.4 C)-98.1 F (36.7 C)] 97.7 F (36.5 C) (10/30 0603) Pulse Rate:  [64-82] 66 (10/30 0603) Resp:  [15-22] 20 (10/29 1950) BP: (135-190)/(39-85) 135/39 mmHg (10/30 0603) SpO2:  [95 %-100 %] 95 % (10/30 0603) Weight:  [92.761 kg (204 lb 8 oz)] 92.761 kg (204 lb 8 oz) (10/30 0500)  Weight change: -6.985 kg (-15 lb 6.4 oz) Filed Weights   05/31/15 0600 06/01/15 0458 06/02/15 0500  Weight: 89.812 kg (198 lb) 99.746 kg (219 lb 14.4 oz) 92.761 kg (204 lb 8 oz)    Intake/Output: I/O last 3 completed shifts: In: 150 [IV Piggyback:150] Out: 1400 [Urine:400; Other:1000]   Intake/Output this shift:     Physical Exam: General: NAD, frail , elderly  Head:  Moist oral mucosal membranes  Eyes: Anicteric  Neck: Supple, trachea midline  Lungs:  Mild scattered rhonchi, normal effort  Heart: Irregular, no rubs  Abdomen:  Soft, nontender, +peritoneal dialysis catheter  Extremities: no peripheral edema  Neurologic: Awake, will follow simple commands  Skin: No acute lesions  Access: PD catheter, R IJ Permcath placed 05/16/15.    Basic Metabolic Panel:  Recent Labs Lab 05/29/15 1303 05/30/15 0407 05/30/15 1250 05/31/15 0334 06/01/15 0601 06/01/15 1554 06/02/15 0542  NA 133* 133*  --   --   --  136 139  K 4.7 2.8*  --  3.1* 3.2* 4.1 3.5  CL 94* 94*  --   --   --  95* 99*  CO2 27 33*  --   --   --  35* 37*  GLUCOSE 106* 92  --   --   --  182* 92  BUN 10 15  --   --   --  16 9  CREATININE 1.75* 1.88*  --   --   --  1.64* 1.17*  CALCIUM 8.9 8.9  --   --   --  9.2 9.0  MG  --   --   --   --  1.9  --   --   PHOS  --   --  3.5  --   --  3.3  --     Liver Function Tests:  Recent Labs Lab 05/29/15 1303 06/01/15 1554  AST 38  --   ALT 10*  --   ALKPHOS 96   --   BILITOT 1.0  --   PROT 6.1*  --   ALBUMIN 2.9* 2.5*   No results for input(s): LIPASE, AMYLASE in the last 168 hours. No results for input(s): AMMONIA in the last 168 hours.  CBC:  Recent Labs Lab 05/29/15 1303 05/30/15 0407 05/31/15 0334 06/01/15 0601 06/02/15 0542  WBC 9.3 8.7 8.0 7.6  --   HGB 8.8* 7.6* 8.2* 8.7* 7.8*  HCT 26.8* 22.8* 25.1* 26.2*  --   MCV 95.8 95.5 95.3 95.9  --   PLT 361 348 352 367  --     Cardiac Enzymes:  Recent Labs Lab 05/29/15 1303 06/01/15 0601  CKTOTAL  --  9*  TROPONINI 0.05*  --     BNP: Invalid input(s): POCBNP  CBG:  Recent Labs Lab 05/30/15 0727 05/31/15 0730 06/01/15 0759 06/02/15 0759  GLUCAP 91 89 93 95    Microbiology: Results for orders placed or performed  during the hospital encounter of 05/29/15  Urine culture     Status: None   Collection Time: 05/29/15 12:00 PM  Result Value Ref Range Status   Specimen Description URINE, CATHETERIZED  Final   Special Requests NONE  Final   Culture MULTIPLE SPECIES PRESENT, SUGGEST RECOLLECTION  Final   Report Status 05/31/2015 FINAL  Final    Coagulation Studies: No results for input(s): LABPROT, INR in the last 72 hours.  Urinalysis:  Recent Labs  06/01/15 1000  COLORURINE YELLOW*  LABSPEC 1.014  PHURINE 5.0  GLUCOSEU 50*  HGBUR NEGATIVE  BILIRUBINUR NEGATIVE  KETONESUR NEGATIVE  PROTEINUR 100*  NITRITE NEGATIVE  LEUKOCYTESUR NEGATIVE      Imaging: No results found.   Medications:     . amLODipine  10 mg Oral QPC supper  . aspirin EC  81 mg Oral Daily  . carvedilol  12.5 mg Oral Q12H  . cefTRIAXone (ROCEPHIN)  IV  1 g Intravenous Q12H  . cloNIDine  0.1 mg Oral 3 times per day  . cyanocobalamin  500 mcg Oral Daily  . docusate sodium  100 mg Oral BID  . epoetin (EPOGEN/PROCRIT) injection  10,000 Units Intravenous Q T,Th,Sa-HD  . famotidine  10 mg Oral Daily  . feeding supplement (NEPRO CARB STEADY)  237 mL Oral BID WC  . fluticasone  2  spray Each Nare Daily  . furosemide  40 mg Oral Daily  . heparin  5,000 Units Subcutaneous 3 times per day  . hydrALAZINE  100 mg Oral TID  . iron polysaccharides  150 mg Oral Daily  . latanoprost  1 drop Both Eyes QHS  . lidocaine  1 patch Transdermal Q24H  . loratadine  10 mg Oral Daily  . multivitamin with minerals  1 tablet Oral Daily  . multivitamin-lutein  1 capsule Oral BID  . pantoprazole  40 mg Oral BID  . phenytoin  300 mg Oral QHS  . predniSONE  10 mg Oral Daily  . sodium chloride  3 mL Intravenous Q12H   sodium chloride, sodium chloride, acetaminophen, alteplase, alum & mag hydroxide-simeth, bisacodyl, heparin, lidocaine (PF), lidocaine-prilocaine, ondansetron **OR** ondansetron (ZOFRAN) IV, oxyCODONE, pentafluoroprop-tetrafluoroeth, promethazine, sodium chloride  Assessment/ Plan:  [name] with past medical history of hypertension, obstructive sleep apnea, DJD, CVA with residual left sided weakness, carotid stenosis status post L CEA 2/15, seizure disorder, diastolic congestive heart failure, anemia  [facility] TTS [facility]  1. End Stage Renal Disease: tolerated dialysis well yesterday. Continue TTS schedule. Consider shorter treatment time.  Ultimate plan is for patient to transition to PD however patient is currently at a SNF and appears quite debilitated.  - discontinued sodium bicarbonate and potassium. Potassium is trending downward. Does have metabolic alkalosis.   2. Hypertension: at goal. Allergy to enalapril (causes anaphylaxis). -  Continue current regimen: furosemide, hydralazine, clonidine, carvedilol and amlodipine.   3. Secondary hyperparathyroidism. PTH 100, phos 3.3, calcium 9 - no indication for binders or vitamin D agent.   4. Anemia chronic kidney disease: hemoglobin 7.8 - epogen 10000 units IV with HD. - iron supplements  5.  Urinary Tract infection with encephalopathy: empiric therapy with ceftriaxone, which  in turn, is known to cause altered mental status in dialysis patients.  - will discontinue gabapentin. More awake this morning.   6. Hypoalbuminemia: concerning. On Nepro.   LOS: 4  Kristina Wilson 10/30/20169:42 AM

## 2015-06-02 NOTE — Progress Notes (Signed)
Physical Therapy Evaluation Patient Details Name: Kristina Wilson MRN: [id number] DOB: [birth year] Today's Date: 06/02/2015   History of Present Illness  Patient is a 81 y.o. female who presents from [facility] on 26 Oct. for unresponsive episode. Patient had recently been treated for UTI. PMH includes ESRD for which she is on hemodialysis and acute on chronic diastolic heart failure.  Clinical Impression  Patient is a pleasant, [name] who presents to hospital from [facility]. Per patient and patient's granddaughter, she is a very limited household ambulator, using her wheelchair and standing assist equipment; however, she had recently begun standing with maximal assistance at [facility]. On evaluation, patient was able to get to EOB with moderate assistance for scooting. Was unable to stand with total assistance x3. Began to demonstrate forward lean; vitals reassessed to find oxygen saturation had dropped from 100% on 5L O2 to 87% and HR increased to 104 bpm. Total assistance utilized to move patient into supine where vitals returned to resting levels within 2 mins. Per patient and patient's granddaughter, patient has been demonstrating improvements in mobility and would like to return to SNF upon discharge. Patient will continue to benefit from skilled PT to improve function as safely tolerated.    Follow Up Recommendations SNF    Equipment Recommendations  None recommended by PT    Recommendations for Other Services       Precautions / Restrictions Precautions Precautions: Fall Restrictions Weight Bearing Restrictions: No      Mobility  Bed Mobility Overal bed mobility: Needs Assistance Bed Mobility: Supine to Sit;Sit to Supine     Supine to sit: Mod assist Sit to supine: Total assist;+2 for physical assistance   General bed mobility comments: Patient rolls and moves supine to sit with use of bed rails. Assistance used for scooting to EOB.  Total assistance +2 used for moving from sit to supine due to increase in HR and drop in oxygen following sit to stand attempts.  Transfers Overall transfer level: Needs assistance Equipment used: Rolling walker (2 wheeled) Transfers: Sit to/from Stand Sit to Stand: Total assist;+2 physical assistance         General transfer comment: Unable to move from sit to stand with total assistance x3 attempts. Patient's oxygen dropped from 100% to 87%, and she began to lean forward. Patient moved from sit to supine with total assistance. Oxygen/HR/BP returned to resting levels within 2 minutes.  Ambulation/Gait                Stairs            Wheelchair Mobility    Modified Rankin (Stroke Patients Only)       Balance Overall balance assessment: Needs assistance Sitting-balance support: Bilateral upper extremity supported Sitting balance-Leahy Scale: Fair                                       Pertinent Vitals/Pain Pain Assessment: No/denies pain Pain Score: 0-No pain    Home Living Family/patient expects to be discharged to:: Skilled nursing facility Living Arrangements: Spouse/significant other;Other (Comment) (Patient lives at home with daughter and granddaughter) Available Help at Discharge: Family Type of Home: House Home Access: Ramped entrance     Home Layout: Multi-level;Able to live on main level with bedroom/bathroom Home Equipment: Wheelchair - manual;Bedside commode;Adaptive equipment;Walker - standard;Other (comment) Dan Humphreys has 4 sliders)  Prior Function Level of Independence: Needs assistance   Gait / Transfers Assistance Needed: Patient utilized stand assist at home; was working towards taking a few steps prior to UTI  ADL's / Homemaking Assistance Needed: Performed by Programmer, applicationsdaughter/granddaughter        Hand Dominance        Extremity/Trunk Assessment   Upper Extremity Assessment: Generalized weakness            Lower Extremity Assessment: Generalized weakness         Communication   Communication: No difficulties  Cognition Arousal/Alertness: Awake/alert Behavior During Therapy: WFL for tasks assessed/performed Overall Cognitive Status: Within Functional Limits for tasks assessed                      General Comments      Exercises        Assessment/Plan    PT Assessment Patient needs continued PT services  PT Diagnosis Difficulty walking;Generalized weakness   PT Problem List Decreased strength;Decreased activity tolerance;Decreased mobility;Cardiopulmonary status limiting activity;Obesity;Decreased balance  PT Treatment Interventions Gait training;Functional mobility training;Therapeutic activities;Therapeutic exercise;Balance training;Patient/family education   PT Goals (Current goals can be found in the Care Plan section) Acute Rehab PT Goals Patient Stated Goal: "To stand" PT Goal Formulation: With patient/family Time For Goal Achievement: 06/16/15 Potential to Achieve Goals: Fair    Frequency Min 2X/week   Barriers to discharge        Co-evaluation               End of Session Equipment Utilized During Treatment: Gait belt;Oxygen Activity Tolerance: Treatment limited secondary to medical complications (Comment);Other (comment) (HR increase and O2 decrease from sit to stand attempts) Patient left: in bed;with call bell/phone within reach;with bed alarm set;with family/visitor present           Time: [id number] PT Time Calculation (min) (ACUTE ONLY): 34 min   Charges:   PT Evaluation $Initial PT Evaluation Tier I: 1 Procedure     PT G Codes:        Neita CarpJulie Ann Thadeus Gandolfi, PT, DPT 06/02/2015, 9:56 AM

## 2015-06-02 NOTE — Progress Notes (Signed)
Patient called out complaining of shortness of breath. RN assessed patient, sats WNL 94-97%, pt does not appear to be in respiratory distress, work of breathing is consistent with this mornings assessment. MD rounding on floor and RN asked about PRN duonebs and MD was agreeable to this.

## 2015-06-02 NOTE — Progress Notes (Signed)
Pt is wearing hospital issued Cpap with home nasal prongs and O2 bled inline. Pt is tolerating well

## 2015-06-03 ENCOUNTER — Inpatient Hospital Stay: Payer: Medicare Other

## 2015-06-03 LAB — BASIC METABOLIC PANEL
ANION GAP: 4 — AB (ref 5–15)
BUN: 15 mg/dL (ref 6–20)
CALCIUM: 9.6 mg/dL (ref 8.9–10.3)
CO2: 37 mmol/L — AB (ref 22–32)
CREATININE: 1.58 mg/dL — AB (ref 0.44–1.00)
Chloride: 97 mmol/L — ABNORMAL LOW (ref 101–111)
GFR calc non Af Amer: 28 mL/min — ABNORMAL LOW (ref >60)
GFR, EST AFRICAN AMERICAN: 32 mL/min — AB (ref >60)
Glucose, Bld: 114 mg/dL — ABNORMAL HIGH (ref 65–99)
Potassium: 3.8 mmol/L (ref 3.5–5.1)
SODIUM: 138 mmol/L (ref 135–145)

## 2015-06-03 LAB — BLOOD GAS, ARTERIAL
ALLENS TEST (PASS/FAIL): POSITIVE — AB
Acid-Base Excess: 10 mmol/L — ABNORMAL HIGH (ref 0.0–3.0)
Bicarbonate: 39 mEq/L — ABNORMAL HIGH (ref 21.0–28.0)
FIO2: 0.32
O2 Saturation: 95 %
PCO2 ART: 74 mmHg — AB (ref 32.0–48.0)
PO2 ART: 81 mmHg — AB (ref 83.0–108.0)
Patient temperature: 37
pH, Arterial: 7.33 — ABNORMAL LOW (ref 7.350–7.450)

## 2015-06-03 LAB — URINE CULTURE
Culture: NO GROWTH
SPECIAL REQUESTS: NORMAL

## 2015-06-03 LAB — PHENYTOIN LEVEL, FREE AND TOTAL
PHENYTOIN, TOTAL: 3.9 ug/mL — AB (ref 10.0–20.0)
Phenytoin, Free: 0.6 ug/mL — ABNORMAL LOW (ref 1.0–2.0)

## 2015-06-03 LAB — HEMOGLOBIN: Hemoglobin: 8 g/dL — ABNORMAL LOW (ref 12.0–16.0)

## 2015-06-03 LAB — GLUCOSE, CAPILLARY: GLUCOSE-CAPILLARY: 110 mg/dL — AB (ref 65–99)

## 2015-06-03 LAB — TROPONIN I: Troponin I: <0.03 ng/mL (ref <0.031)

## 2015-06-03 MED ORDER — METHYLPREDNISOLONE SODIUM SUCC 125 MG IJ SOLR
60.0000 mg | Freq: Four times a day (QID) | INTRAMUSCULAR | Status: DC
  Administered 2015-06-03: 60 mg via INTRAVENOUS
  Filled 2015-06-03: qty 2

## 2015-06-03 MED ORDER — FUROSEMIDE 10 MG/ML IJ SOLN: 80.0000 mg | Freq: Once | INTRAMUSCULAR | Status: DC

## 2015-06-03 MED ORDER — BISACODYL 10 MG RE SUPP
10.0000 mg | Freq: Every day | RECTAL | Status: DC | PRN
  Administered 2015-06-03: 10 mg via RECTAL
  Filled 2015-06-03: qty 1

## 2015-06-03 MED ORDER — IPRATROPIUM-ALBUTEROL 0.5-2.5 (3) MG/3ML IN SOLN
3.0000 mL | RESPIRATORY_TRACT | Status: DC
  Administered 2015-06-03 – 2015-06-04 (×6): 3 mL via RESPIRATORY_TRACT
  Filled 2015-06-03 (×6): qty 3

## 2015-06-03 NOTE — Progress Notes (Signed)
Patient c/o sob. svn given. bbs diminished sats on cpap 2.5liters 90-91% placed patient back on cpap post svn treatment. Patient continues to state cannot breath. o2 sats noted at 87% with cpap and o2. Placed patient on venturi mask to increase sats. To 95%. Will continue to monitor

## 2015-06-03 NOTE — Progress Notes (Signed)
Kristina Wilson is a 81 y.o. female  [id number]  Primary Cardiologist: Neoma Laming Reason for Consultation: Idioventricular rhythm  HPI: This is a 81 year old white female with a past medical history of end-stage renal disease on hemodialysis 3 times a week came from [facility] due to lethargy was on regular floor until last night when she became very short of breath with agonal breathing and was transferred to telemetry. Monitor showed some idioventricular rhythm thus I was asked to evaluate the patient. Patient is wearing a mask and is having difficulty breathing, and family member says she's been like this since yesterday.      Past Medical History  Diagnosis Date  . Muscle weakness   . Chronic respiratory failure ([place])   . Pneumonia   . Sleep apnea   . CHF (congestive heart failure) ([place])   . CKD (chronic kidney disease), stage IV ([place])   . Dysphagia   . GERD (gastroesophageal reflux disease)   . Back pain   . Epilepsy ([place])   . Hypertension   . Stroke ([place])   . Headache   . Arthritis   . Anemia     iron deficiency anemia  . Complication of anesthesia     difficult waking up after surgery (2013)    Medications Prior to Admission  Medication Sig Dispense Refill  . acetaminophen (TYLENOL) 325 MG tablet Take 650 mg by mouth every 4 (four) hours as needed for mild pain, fever or headache.     Marland Kitchen amLODipine (NORVASC) 10 MG tablet Take 1 tablet (10 mg total) by mouth daily after supper. 30 tablet 0  . aspirin EC 81 MG EC tablet Take 1 tablet (81 mg total) by mouth daily. 30 tablet 0  . bimatoprost (LUMIGAN) 0.03 % ophthalmic solution Place 1 drop into both eyes 2 (two) times daily.    . carbamide peroxide (DEBROX) 6.5 % otic solution Place 5 drops into the right ear at bedtime.    . carvedilol (COREG) 12.5 MG tablet Take 12.5 mg by mouth every 12 (twelve) hours.    . cloNIDine (CATAPRES) 0.1 MG tablet Take 0.1 mg by mouth every 8 (eight) hours.    . Coenzyme Q10  (COQ10) 50 MG CAPS Take 50 mg by mouth daily.    . cyanocobalamin 500 MCG tablet Take 500 mcg by mouth daily.    Marland Kitchen docusate sodium (COLACE) 100 MG capsule Take 100 mg by mouth every other day.    . fentaNYL (DURAGESIC - DOSED MCG/HR) 25 MCG/HR patch Place 1 patch (25 mcg total) onto the skin every 3 (three) days. 5 patch 0  . fluticasone (FLONASE) 50 MCG/ACT nasal spray Place 2 sprays into both nostrils daily.  2  . furosemide (LASIX) 40 MG tablet Take 40 mg by mouth daily.    Marland Kitchen gabapentin (NEURONTIN) 100 MG capsule Take 200 mg by mouth every 8 (eight) hours.    Marland Kitchen gentamicin cream (GARAMYCIN) 0.1 % Apply 1 application topically every 12 (twelve) hours as needed (for irritation).    . hydrALAZINE (APRESOLINE) 100 MG tablet Take 1 tablet (100 mg total) by mouth 3 (three) times daily. (Patient taking differently: Take 100 mg by mouth every 6 (six) hours. ) 90 tablet 0  . iron polysaccharides (NIFEREX) 150 MG capsule Take 1 capsule (150 mg total) by mouth daily. 30 capsule 0  . lactulose (CHRONULAC) 10 GM/15ML solution Take 20 g by mouth daily as needed for mild constipation.    . lidocaine (  LIDODERM) 5 % Place 1 patch onto the skin daily. Remove & Discard patch within 12 hours or as directed by MD    . loratadine (CLARITIN) 10 MG tablet Take 1 tablet (10 mg total) by mouth daily.    . metaxalone (SKELAXIN) 800 MG tablet Take 800 mg by mouth every 8 (eight) hours as needed for muscle spasms.     . Multiple Vitamin (MULTIVITAMIN WITH MINERALS) TABS tablet Take 1 tablet by mouth daily.    . Multiple Vitamins-Minerals (PRESERVISION AREDS PO) Take 1 capsule by mouth 2 (two) times daily.    . nitroGLYCERIN (NITROSTAT) 0.4 MG SL tablet Place 1 tablet (0.4 mg total) under the tongue every 5 (five) minutes as needed for chest pain. 20 tablet 0  . oxyCODONE (OXY IR/ROXICODONE) 5 MG immediate release tablet Take 1 tablet (5 mg total) by mouth 3 (three) times daily as needed for moderate pain or breakthrough pain.  (Patient taking differently: Take 5 mg by mouth every 8 (eight) hours as needed for moderate pain or breakthrough pain. ) 30 tablet 0  . pantoprazole (PROTONIX) 40 MG tablet Take 40 mg by mouth 2 (two) times daily.    . phenytoin (DILANTIN) 100 MG ER capsule Take 300 mg by mouth at bedtime.     . predniSONE (DELTASONE) 10 MG tablet Take 10 mg by mouth daily.    . promethazine (PHENERGAN) 25 MG tablet Take 25 mg by mouth every 4 (four) hours as needed for nausea or vomiting.    . ranitidine (ZANTAC) 150 MG tablet Take 150 mg by mouth 2 (two) times daily.    . sodium bicarbonate 650 MG tablet Take 1,300 mg by mouth 2 (two) times daily.    . carvedilol (COREG) 25 MG tablet Take 1 tablet (25 mg total) by mouth at bedtime. (Patient not taking: Reported on 05/29/2015) 30 tablet 0  . cloNIDine (CATAPRES) 0.3 MG tablet Take 1 tablet (0.3 mg total) by mouth 3 (three) times daily. (Patient not taking: Reported on 05/29/2015)    . lactulose (CHRONULAC) 10 GM/15ML solution Take 30 mLs (20 g total) by mouth daily as needed for mild constipation. (Patient not taking: Reported on 05/29/2015) 240 mL 0     . amLODipine  10 mg Oral QPC supper  . aspirin EC  81 mg Oral Daily  . carvedilol  12.5 mg Oral Q12H  . cefTRIAXone (ROCEPHIN)  IV  1 g Intravenous Q12H  . cloNIDine  0.1 mg Oral 3 times per day  . cyanocobalamin  500 mcg Oral Daily  . docusate sodium  100 mg Oral BID  . epoetin (EPOGEN/PROCRIT) injection  10,000 Units Intravenous Q T,Th,Sa-HD  . famotidine  10 mg Oral Daily  . feeding supplement (NEPRO CARB STEADY)  237 mL Oral BID WC  . fluticasone  2 spray Each Nare Daily  . furosemide  40 mg Oral Daily  . heparin  5,000 Units Subcutaneous 3 times per day  . hydrALAZINE  100 mg Oral TID  . iron polysaccharides  150 mg Oral Daily  . latanoprost  1 drop Both Eyes QHS  . lidocaine  1 patch Transdermal Q24H  . loratadine  10 mg Oral Daily  . multivitamin with minerals  1 tablet Oral Daily  .  multivitamin-lutein  1 capsule Oral BID  . pantoprazole  40 mg Oral BID  . phenytoin  300 mg Oral QHS  . predniSONE  10 mg Oral Daily  . sodium chloride  3 mL Intravenous  Q12H    Infusions:    Allergies  Allergen Reactions  . Enalapril Anaphylaxis  . Tramadol Shortness Of Breath  . Capsaicin Other (See Comments)    Reaction:  Unknown   . Codeine Other (See Comments)    Reaction:  Unknown   . Hydrocodone Other (See Comments)    Reaction:  Unknown   . Oxycodone Other (See Comments)    Reaction:  Unknown   . Vicodin [Hydrocodone-Acetaminophen] Other (See Comments)    Reaction:  Unknown     Social History   Social History  . Marital Status: Widowed    Spouse Name: N/A  . Number of Children: N/A  . Years of Education: N/A   Occupational History  . Not on file.   Social History Main Topics  . Smoking status: Former Smoker -- 2.00 packs/day for 20 years    Types: Cigarettes    Quit date: 06/04/1967  . Smokeless tobacco: Never Used  . Alcohol Use: No  . Drug Use: No  . Sexual Activity: No   Other Topics Concern  . Not on file   Social History Narrative    Family History  Problem Relation Age of Onset  . CAD Mother   . CAD Father   . Anemia Father   . CVA Sister   . Multiple myeloma Brother     PHYSICAL EXAM: Filed Vitals:   06/03/15 0829  BP: 154/43  Pulse: 76  Temp:   Resp:      Intake/Output Summary (Last 24 hours) at 06/03/15 0830 Last data filed at 06/03/15 0523  Gross per 24 hour  Intake    290 ml  Output      0 ml  Net    290 ml    General:  Well appearing. No respiratory difficulty HEENT: normal Neck: supple. no JVD. Carotids 2+ bilat; no bruits. No lymphadenopathy or thryomegaly appreciated. Cor: PMI nondisplaced. Regular rate & rhythm. No rubs, gallops or murmurs. Lungs: clear Abdomen: soft, nontender, nondistended. No hepatosplenomegaly. No bruits or masses. Good bowel sounds. Extremities: no cyanosis, clubbing, rash,  edema Neuro: alert & oriented x 3, cranial nerves grossly intact. moves all 4 extremities w/o difficulty. Affect pleasant.  ECG: Admission EKG reveals sinus rhythm 85 bpm with occasional PVCs and poor R-wave progression and low voltage nonspecific ST-T changes and prolonged QT interval of 492  Results for orders placed or performed during the hospital encounter of 05/29/15 (from the past 24 hour(s))  Hemoglobin     Status: Abnormal   Collection Time: 06/03/15  7:14 AM  Result Value Ref Range   Hemoglobin 8.0 (L) 12.0 - 16.0 g/dL  Basic metabolic panel     Status: Abnormal   Collection Time: 06/03/15  7:14 AM  Result Value Ref Range   Sodium 138 135 - 145 mmol/L   Potassium 3.8 3.5 - 5.1 mmol/L   Chloride 97 (L) 101 - 111 mmol/L   CO2 37 (H) 22 - 32 mmol/L   Glucose, Bld 114 (H) 65 - 99 mg/dL   BUN 15 6 - 20 mg/dL   Creatinine, Ser 1.58 (H) 0.44 - 1.00 mg/dL   Calcium 9.6 8.9 - 10.3 mg/dL   GFR calc non Af Amer 28 (L) >60 mL/min   GFR calc Af Amer 32 (L) >60 mL/min   Anion gap 4 (L) 5 - 15  Glucose, capillary     Status: Abnormal   Collection Time: 06/03/15  8:06 AM  Result Value Ref Range  Glucose-Capillary 110 (H) 65 - 99 mg/dL   Comment 1 Notify RN    No results found.   ASSESSMENT AND PLAN: Patient has patient has idioventricular rhythm last night but right now is in sinus rhythm about 80 bpm, has difficulty breathing wearing a facemask. Since patient is 81 year old advise no aggressive therapy and consider making the patient DO NOT RESUSCITATE.  Ebelin Dillehay A

## 2015-06-03 NOTE — Progress Notes (Signed)
PT Cancellation Note  Patient Details Name: Kristina Wilson MRN: [id number] DOB: [birth year]   Cancelled Treatment:    Reason Eval/Treat Not Completed: Patient at procedure or test/unavailable. Patient currently at dialysis and unavailable for PT treatment. PT will re-attempt at later time/date as patient is appropriate and available.   Kerin RansomPatrick A McNamara, PT, DPT    06/03/2015, 11:22 AM

## 2015-06-03 NOTE — Progress Notes (Signed)
Pre-hd tx 

## 2015-06-03 NOTE — Progress Notes (Signed)
Physical Therapy Treatment Patient Details Name: Kristina Wilson MRN: [id number] DOB: 1922-08-27 Today's Date: 06/03/2015    History of Present Illness Patient is a 81 y.o. female who [facility] for unresponsive episode. Patient had recently been treated for UTI. PMH includes ESRD for which she is on hemodialysis and acute on chronic diastolic heart failure.    PT Comments    PT reviewed patient's chart and discussed with RN, patient recently changed from CPAP, BiPAP to nasal cannula and has had shortness of breath throughout the day. Patient also being treated for elevated BP as PT entered room. Patient minimally interactive today, though she did provide effort throughout exercise session. Further mobility deferred at this time given the above medical complications. Skilled rehab services continue to be indicated to increase her mobility status.   Follow Up Recommendations  SNF     Equipment Recommendations       Recommendations for Other Services       Precautions / Restrictions Precautions Precautions: Fall Precaution Comments: Contact isolation, R IJ permcath Restrictions Weight Bearing Restrictions: No    Mobility  Bed Mobility               General bed mobility comments: Deferred bed mobility secondary to recent change in O2 administration and previous episode of significant increase in pain with this patient.   Transfers                    Ambulation/Gait                 Stairs            Wheelchair Mobility    Modified Rankin (Stroke Patients Only)       Balance                                    Cognition Arousal/Alertness: Awake/alert Behavior During Therapy: Flat affect (Very detached, she was perseverative and made minimal attempts at interaction) Overall Cognitive Status: Within Functional Limits for tasks assessed                      Exercises General Exercises  - Lower Extremity Ankle Circles/Pumps: AROM;AAROM;10 reps Quad Sets: AROM;Both;10 reps Heel Slides: AROM;AAROM;Both;10 reps Hip ABduction/ADduction: AROM;AAROM;Both;10 reps Straight Leg Raises: AROM;AAROM;10 reps    General Comments        Pertinent Vitals/Pain Pain Assessment:  (No mention of pain in this session)    Home Living                      Prior Function            PT Goals (current goals can now be found in the care plan section) Acute Rehab PT Goals Patient Stated Goal: "To stand" PT Goal Formulation: With patient/family Time For Goal Achievement: 06/16/15 Potential to Achieve Goals: Fair Additional Goals Additional Goal #1: Unsupported sitting balance edge of bed, mod indep, for participation with ADLs and functional activities. Progress towards PT goals: Not progressing toward goals - comment (BP and O2 sats indicated patient not appropriate for mobility at this time given her previous attempts at mobility. )    Frequency  Min 2X/week    PT Plan Current plan remains appropriate    Co-evaluation             End of  Session Equipment Utilized During Treatment: Oxygen Activity Tolerance: Treatment limited secondary to medical complications (Comment);Patient limited by lethargy (Recent change from CPAP/BiPAP trials to nasal cannula, elevated BP) Patient left: in bed;with call bell/phone within reach;with nursing/sitter in room;with bed alarm set     Time: 1610-9604 PT Time Calculation (min) (ACUTE ONLY): 9 min  Charges:  $Therapeutic Exercise: 8-22 mins                    G Codes:      Kerin Ransom, PT, DPT    06/03/2015, 4:52 PM

## 2015-06-03 NOTE — Progress Notes (Signed)
Subjective:   Patient seen during dialysis Tolerating well  Emergency dialysis treatment today because of shortness of breath  ABG shows PCO2 retention Differential diagnoses included fluid overload .   Objective:  Vital signs in last 24 hours:  Temp:  [98.1 F (36.7 C)-98.6 F (37 C)] 98.6 F (37 C) (10/31 0930) Pulse Rate:  [58-89] 65 (10/31 1030) Resp:  [14-21] 21 (10/31 1030) BP: (110-172)/(35-92) 110/35 mmHg (10/31 1030) SpO2:  [91 %-100 %] 99 % (10/31 1030) FiO2 (%):  [55 %] 55 % (10/31 0128) Weight:  [94.5 kg (208 lb 5.4 oz)-95.1 kg (209 lb 10.5 oz)] 95.1 kg (209 lb 10.5 oz) (10/31 0930)  Weight change: 1.74 kg (3 lb 13.4 oz) Filed Weights   06/02/15 0500 06/03/15 0500 06/03/15 0930  Weight: 92.761 kg (204 lb 8 oz) 94.5 kg (208 lb 5.4 oz) 95.1 kg (209 lb 10.5 oz)    Intake/Output: I/O last 3 completed shifts: In: 460 [P.O.:360; IV Piggyback:100] Out: 0    Intake/Output this shift:     Physical Exam: General: NAD,  Frail elderly  Head:  Moist oral mucosal membranes  Eyes: Anicteric  Neck: Supple, trachea midline  Lungs:  Mild scattered rhonchi, normal effort  Heart: Irregular, no rubs  Abdomen:  Soft, nontender,   Extremities: no peripheral edema, left leg wound bandaged  Neurologic: Awake, will follow simple commands  Skin: No acute lesions  Access: Rt IJ PC, PD cathter    Basic Metabolic Panel:  Recent Labs Lab 05/29/15 1303 05/30/15 0407 05/30/15 1250 05/31/15 0334 06/01/15 0601 06/01/15 1554 06/02/15 0542 06/03/15 0714  NA 133* 133*  --   --   --  136 139 138  K 4.7 2.8*  --  3.1* 3.2* 4.1 3.5 3.8  CL 94* 94*  --   --   --  95* 99* 97*  CO2 27 33*  --   --   --  35* 37* 37*  GLUCOSE 106* 92  --   --   --  182* 92 114*  BUN 10 15  --   --   --  16 9 15   CREATININE 1.75* 1.88*  --   --   --  1.64* 1.17* 1.58*  CALCIUM 8.9 8.9  --   --   --  9.2 9.0 9.6  MG  --   --   --   --  1.9  --   --   --   PHOS  --   --  3.5  --   --  3.3  --   --      Liver Function Tests:  Recent Labs Lab 05/29/15 1303 06/01/15 1554  AST 38  --   ALT 10*  --   ALKPHOS 96  --   BILITOT 1.0  --   PROT 6.1*  --   ALBUMIN 2.9* 2.5*   No results for input(s): LIPASE, AMYLASE in the last 168 hours. No results for input(s): AMMONIA in the last 168 hours.  CBC:  Recent Labs Lab 05/29/15 1303 05/30/15 0407 05/31/15 0334 06/01/15 0601 06/02/15 0542 06/03/15 0714  WBC 9.3 8.7 8.0 7.6  --   --   HGB 8.8* 7.6* 8.2* 8.7* 7.8* 8.0*  HCT 26.8* 22.8* 25.1* 26.2*  --   --   MCV 95.8 95.5 95.3 95.9  --   --   PLT 361 348 352 367  --   --     Cardiac Enzymes:  Recent Labs Lab 05/29/15  1303 06/01/15 0601 06/03/15 0714  CKTOTAL  --  9*  --   TROPONINI 0.05*  --  <0.03    BNP: Invalid input(s): POCBNP  CBG:  Recent Labs Lab 05/30/15 0727 05/31/15 0730 06/01/15 0759 06/02/15 0759 06/03/15 0806  GLUCAP 91 89 93 95 110*    Microbiology: Results for orders placed or performed during the hospital encounter of 05/29/15  Urine culture     Status: None   Collection Time: 05/29/15 12:00 PM  Result Value Ref Range Status   Specimen Description URINE, CATHETERIZED  Final   Special Requests NONE  Final   Culture MULTIPLE SPECIES PRESENT, SUGGEST RECOLLECTION  Final   Report Status 05/31/2015 FINAL  Final  Urine culture     Status: None (Preliminary result)   Collection Time: 06/01/15 10:00 AM  Result Value Ref Range Status   Specimen Description URINE, CATHETERIZED  Final   Special Requests Normal  Final   Culture NO GROWTH < 24 HOURS  Final   Report Status PENDING  Incomplete    Coagulation Studies: No results for input(s): LABPROT, INR in the last 72 hours.  Urinalysis:  Recent Labs  06/01/15 1000  COLORURINE YELLOW*  LABSPEC 1.014  PHURINE 5.0  GLUCOSEU 50*  HGBUR NEGATIVE  BILIRUBINUR NEGATIVE  KETONESUR NEGATIVE  PROTEINUR 100*  NITRITE NEGATIVE  LEUKOCYTESUR NEGATIVE      Imaging: Dg Chest Port 1  View  06/03/2015  CLINICAL DATA:  Onset of shortness of breath last evening, history of CHF and pneumonia, previous tobacco use. EXAM: PORTABLE CHEST 1 VIEW COMPARISON:  PA and lateral chest x-ray of May 29, 2015 FINDINGS: The lungs are hypoinflated. There are moderate-sized bilateral pleural effusions. The pulmonary vascularity is engorged and the pulmonary interstitial markings are increased. The cardiac silhouette remains enlarged. The dialysis type catheter is in reasonable position with the tip at the cavoatrial junction. IMPRESSION: Congestive heart failure with pulmonary interstitial edema and moderate-sized pleural effusions. There has been mild deterioration in the appearance of the chest since May 29, 2015. Electronically Signed   By: David  [place] M.D.   On: 06/03/2015 09:02     Medications:     . amLODipine  10 mg Oral QPC supper  . aspirin EC  81 mg Oral Daily  . carvedilol  12.5 mg Oral Q12H  . cloNIDine  0.1 mg Oral 3 times per day  . cyanocobalamin  500 mcg Oral Daily  . docusate sodium  100 mg Oral BID  . epoetin (EPOGEN/PROCRIT) injection  10,000 Units Intravenous Q T,Th,Sa-HD  . famotidine  10 mg Oral Daily  . feeding supplement (NEPRO CARB STEADY)  237 mL Oral BID WC  . fluticasone  2 spray Each Nare Daily  . furosemide  80 mg Intravenous Once  . heparin  5,000 Units Subcutaneous 3 times per day  . hydrALAZINE  100 mg Oral TID  . ipratropium-albuterol  3 mL Nebulization Q4H  . iron polysaccharides  150 mg Oral Daily  . latanoprost  1 drop Both Eyes QHS  . lidocaine  1 patch Transdermal Q24H  . loratadine  10 mg Oral Daily  . methylPREDNISolone (SOLU-MEDROL) injection  60 mg Intravenous Q6H  . multivitamin with minerals  1 tablet Oral Daily  . multivitamin-lutein  1 capsule Oral BID  . pantoprazole  40 mg Oral BID  . phenytoin  300 mg Oral QHS  . sodium chloride  3 mL Intravenous Q12H   sodium chloride, sodium chloride, acetaminophen, alteplase, alum &  mag  hydroxide-simeth, bisacodyl, bisacodyl, heparin, lidocaine (PF), lidocaine-prilocaine, ondansetron **OR** ondansetron (ZOFRAN) IV, oxyCODONE, pentafluoroprop-tetrafluoroeth, promethazine, sodium chloride  Assessment/ Plan:  [name] with past medical history of hypertension, obstructive sleep apnea, DJD, CVA with residual left sided weakness, carotid stenosis status post L CEA 2/15, seizure disorder, diastolic congestive heart failure, anemia  [facility] T[facility]  1. End Stage Renal Disease: tolerated dialysis well yesterday. Continue TTS schedule. Consider shorter treatment time. Ultimate plan is for patient to transition to [facility] however patient is currently at a SNF and appears quite debilitated.  - discontinued sodium bicarbonate and potassium. Potassium is trending downward. Does have metabolic alkalosis.  - urgent  Treatment requested by IM team for shortness of breath thought to be secondary to fluid overload - Patient seen during dialysis Tolerating well   2. Hypertension: at goal. Allergy to enalapril (causes anaphylaxis). -  Continue current regimen: furosemide, hydralazine, clonidine, carvedilol and amlodipine.   3. Secondary hyperparathyroidism. PTH 100, phos 3.3,  - no indication for binders or vitamin D agent.   4. Anemia chronic kidney disease: hemoglobin 8.0 - epogen 10000 units IV with HD. - iron supplements  5.  Urinary Tract infection with encephalopathy:  Patient is also getting oxycodone ? Causing somnolence  6. Hypoalbuminemia: concerning. On Nepro.   LOS: 5  Kristina Wilson 10/31/201610:47 AM

## 2015-06-03 NOTE — Progress Notes (Signed)
HD tx start 

## 2015-06-03 NOTE — Progress Notes (Signed)
Per report from [facility] 2L of fluid was removed from patient during dialysis, BP 145/59 and HR 68. Per Dr. Renae GlossWieting okay to d/c 80mg  of Lasix IV since patient had 2L removed during dialysis.

## 2015-06-03 NOTE — Progress Notes (Signed)
Paged prime MD regarding new ventricular rhythm. Per Dr. Sheryle Hailiamond, put in a consult for cardiology; order placed. Patient remains asymptomatic. Nursing staff will continue to monitor. Lamonte RicherKara A Dacen Frayre, RN

## 2015-06-03 NOTE — Care Management (Signed)
Discussed the need for patient to sit for hemodialysis and stressed the importance for compliance with this care need.  Updated CSW that continue to anticipate discharge to a skilled nursing facility.  Preference is [facility] and this facility has declined to make a bed offer.  Patient required extra dialysis treatment today due to increased fluid volume.  She required ventimask at 55%

## 2015-06-03 NOTE — Progress Notes (Signed)
HD tx completed.

## 2015-06-03 NOTE — Progress Notes (Signed)
Post hd tx 

## 2015-06-03 NOTE — Progress Notes (Signed)
Patient has rested quietly tonight. Telemetry monitor indicated a ventricular rhythm at two different times during the night, but patient was asymptomatic and reverted back to sinus rhythm each time. Nursing staff will continue to monitor. Lamonte RicherKara A Hansini Clodfelter, RN

## 2015-06-03 NOTE — Progress Notes (Signed)
Patient ID: Kristina Wilson, female   DOB: 03-Aug-1923, 81 y.o.   MRN: [id number] [facility] PROGRESS NOTE  PCP: Jaclyn ShaggyATE,DENNY C, MD  HPI/Subjective: Patient short of breath since last night. When into an idioventricular rhythm last night but now in normal sinus rhythm. Some cough. Positive for chest pain. Some abdominal pain.  Objective: Filed Vitals:   06/03/15 0829  BP: 154/43  Pulse: 76  Temp:   Resp:     Filed Weights   06/01/15 0458 06/02/15 0500 06/03/15 0500  Weight: 99.746 kg (219 lb 14.4 oz) 92.761 kg (204 lb 8 oz) 94.5 kg (208 lb 5.4 oz)    ROS: Review of Systems  Constitutional: Negative for fever and chills.  Eyes: Negative for blurred vision.  Respiratory: Positive for cough and shortness of breath.   Cardiovascular: Positive for chest pain.  Gastrointestinal: Positive for abdominal pain and constipation. Negative for nausea, vomiting and diarrhea.  Genitourinary: Negative for dysuria.  Musculoskeletal: Positive for joint pain.  Neurological: Negative for dizziness and headaches.   Exam: Physical Exam  HENT:  Nose: No mucosal edema.  Mouth/Throat: No oropharyngeal exudate or posterior oropharyngeal edema.  Eyes: Conjunctivae, EOM and lids are normal. Pupils are equal, round, and reactive to light.  Neck: No JVD present. Carotid bruit is not present.  Cardiovascular: S1 normal and S2 normal.  Exam reveals no gallop.   No murmur heard. Pulses:      Dorsalis pedis pulses are 2+ on the right side, and 2+ on the left side.  Respiratory: Accessory muscle usage present. She is in respiratory distress. She has decreased breath sounds in the right lower field and the left lower field. She has no wheezes. She has rhonchi in the right lower field and the left lower field. She has no rales.  GI: Soft. Bowel sounds are normal. There is tenderness in the left lower quadrant.  Musculoskeletal:       Right ankle: She exhibits swelling.       Left ankle: She  exhibits swelling.  Lymphadenopathy:    She has no cervical adenopathy.  Neurological: She is alert.  Skin: Skin is warm. No rash noted. Nails show no clubbing.  Bruising left knee.  Psychiatric: She has a normal mood and affect.    Data Reviewed: Basic Metabolic Panel:  Recent Labs Lab 05/29/15 1303 05/30/15 0407 05/30/15 1250 05/31/15 0334 06/01/15 0601 06/01/15 1554 06/02/15 0542 06/03/15 0714  NA 133* 133*  --   --   --  136 139 138  K 4.7 2.8*  --  3.1* 3.2* 4.1 3.5 3.8  CL 94* 94*  --   --   --  95* 99* 97*  CO2 27 33*  --   --   --  35* 37* 37*  GLUCOSE 106* 92  --   --   --  182* 92 114*  BUN 10 15  --   --   --  16 9 15   CREATININE 1.75* 1.88*  --   --   --  1.64* 1.17* 1.58*  CALCIUM 8.9 8.9  --   --   --  9.2 9.0 9.6  MG  --   --   --   --  1.9  --   --   --   PHOS  --   --  3.5  --   --  3.3  --   --    Liver Function Tests:  Recent Labs Lab 05/29/15 1303 06/01/15  1554  AST 38  --   ALT 10*  --   ALKPHOS 96  --   BILITOT 1.0  --   PROT 6.1*  --   ALBUMIN 2.9* 2.5*  CBC:  Recent Labs Lab 05/29/15 1303 05/30/15 0407 05/31/15 0334 06/01/15 0601 06/02/15 0542 06/03/15 0714  WBC 9.3 8.7 8.0 7.6  --   --   HGB 8.8* 7.6* 8.2* 8.7* 7.8* 8.0*  HCT 26.8* 22.8* 25.1* 26.2*  --   --   MCV 95.8 95.5 95.3 95.9  --   --   PLT 361 348 352 367  --   --    CBG:  Recent Labs Lab 05/30/15 0727 05/31/15 0730 06/01/15 0759 06/02/15 0759 06/03/15 0806  GLUCAP 91 89 93 95 110*    Recent Results (from the past 240 hour(s))  Urine culture     Status: None   Collection Time: 05/29/15 12:00 PM  Result Value Ref Range Status   Specimen Description URINE, CATHETERIZED  Final   Special Requests NONE  Final   Culture MULTIPLE SPECIES PRESENT, SUGGEST RECOLLECTION  Final   Report Status 05/31/2015 FINAL  Final  Urine culture     Status: None (Preliminary result)   Collection Time: 06/01/15 10:00 AM  Result Value Ref Range Status   Specimen Description  URINE, CATHETERIZED  Final   Special Requests Normal  Final   Culture NO GROWTH < 24 HOURS  Final   Report Status PENDING  Incomplete     Scheduled Meds: . amLODipine  10 mg Oral QPC supper  . aspirin EC  81 mg Oral Daily  . carvedilol  12.5 mg Oral Q12H  . cloNIDine  0.1 mg Oral 3 times per day  . cyanocobalamin  500 mcg Oral Daily  . docusate sodium  100 mg Oral BID  . epoetin (EPOGEN/PROCRIT) injection  10,000 Units Intravenous Q T,Th,Sa-HD  . famotidine  10 mg Oral Daily  . feeding supplement (NEPRO CARB STEADY)  237 mL Oral BID WC  . fluticasone  2 spray Each Nare Daily  . furosemide  80 mg Intravenous Once  . heparin  5,000 Units Subcutaneous 3 times per day  . hydrALAZINE  100 mg Oral TID  . ipratropium-albuterol  3 mL Nebulization Q4H  . iron polysaccharides  150 mg Oral Daily  . latanoprost  1 drop Both Eyes QHS  . lidocaine  1 patch Transdermal Q24H  . loratadine  10 mg Oral Daily  . methylPREDNISolone (SOLU-MEDROL) injection  60 mg Intravenous Q6H  . multivitamin with minerals  1 tablet Oral Daily  . multivitamin-lutein  1 capsule Oral BID  . pantoprazole  40 mg Oral BID  . phenytoin  300 mg Oral QHS  . sodium chloride  3 mL Intravenous Q12H    Assessment/Plan:  1. Acute on chronic respiratory failure with hypoxia and hypercarbia. Patient using accessory muscles to breathe. Chest x-ray looks like heart failure. I will give a dose of 80 mg IV Lasix. I will call nephrology for an extra dialysis today. Since she is a CO2 retainer try to keep oxygen at the lowest minimal. We'll also switch her chronic prednisone to IV Solu-Medrol at this point. Addressed CODE STATUS again and patient is a full code. 2. Idioventricular rhythm last night - seen by cardiology. Conservative management.  3. Acute encephalopathy with lethargy. Thought initially to be secondary to acute cystitis with hematuria. Urine culture grew out multiple organisms which is a contaminant. Repeat urine culture  is negative. Stop Rocephin at this point. I will cancel MRI of the brain at this point since the patient is doing better. Patient is answering questions appropriately and following commands. This could be polypharmacy. Pain patch and gabapentin and Skelaxin removed.  4. Acute cystitis with hematuria. Stop antibiotics 5. End-stage renal disease on dialysis Tuesday Thursday and Saturday. We'll speak with nephrology about an extra dialysis today. 6. Seizure history. On Dilantin. Level subtherapeutic but no signs of seizure. 7. Essential hypertension. Blood pressure stable on Coreg, amlodipine, clonidine and hydralazine 8. Neuropathy - gabapentin stopped 9. Glaucoma unspecified on Xalatan 10. Gastroesophageal reflux disease without esophagitis on Protonix 11. Sleep apnea on nasal CPAP at night 12. Weakness - PT recommended rehabilitation 13. Anemia of chronic disease- on Procrit with dialysis. 14. Chronic pain - restart oral oxycodone. 15. Patient also on chronic prednisone- Solu-Medrol at this point.  Code Status:     Code Status Orders        Start     Ordered   05/29/15 1601  Full code   Continuous     05/29/15 1605    Advance Directive Documentation        Most Recent Value   Type of Advance Directive  Healthcare Power of [facility], daughter]   Pre-existing out of facility DNR order (yellow form or pink MOST form)     "MOST" Form in Place?       Family Communication: daughter at bedside  Disposition Plan: To be determined  Time spent: 30 minutes. Patient critically ill high risk for decompensation.  Schimek Highland  [facility]

## 2015-06-03 NOTE — Progress Notes (Signed)
Patient was having complaints of shortness of breath with the use of accessory muscles. Patient was on venturi mask 13L at 55%, raised head of bead and  switched patient back to 3L via nasal cannula per MD. O2 sats were at 98% on 3L. Patient stated that she felt better. Kristina Haskellonnisha R Raegan Wilson

## 2015-06-04 LAB — GLUCOSE, CAPILLARY: Glucose-Capillary: 84 mg/dL (ref 65–99)

## 2015-06-04 MED ORDER — IPRATROPIUM-ALBUTEROL 0.5-2.5 (3) MG/3ML IN SOLN
3.0000 mL | Freq: Four times a day (QID) | RESPIRATORY_TRACT | Status: DC
  Administered 2015-06-04 – 2015-06-06 (×7): 3 mL via RESPIRATORY_TRACT
  Filled 2015-06-04 (×7): qty 3

## 2015-06-04 MED ORDER — FUROSEMIDE 40 MG PO TABS
80.0000 mg | ORAL_TABLET | Freq: Every day | ORAL | Status: DC
  Administered 2015-06-05 – 2015-06-06 (×2): 80 mg via ORAL
  Filled 2015-06-04 (×2): qty 2

## 2015-06-04 MED ORDER — FENTANYL 12 MCG/HR TD PT72
12.5000 ug | MEDICATED_PATCH | TRANSDERMAL | Status: DC
  Administered 2015-06-04: 12.5 ug via TRANSDERMAL
  Filled 2015-06-04: qty 1

## 2015-06-04 MED ORDER — MORPHINE SULFATE (PF) 2 MG/ML IV SOLN
2.0000 mg | Freq: Once | INTRAVENOUS | Status: AC
  Administered 2015-06-04: 2 mg via INTRAVENOUS
  Filled 2015-06-04: qty 1

## 2015-06-04 MED ORDER — PREDNISONE 10 MG PO TABS
10.0000 mg | ORAL_TABLET | Freq: Every day | ORAL | Status: DC
  Administered 2015-06-04 – 2015-06-06 (×3): 10 mg via ORAL
  Filled 2015-06-04 (×3): qty 1

## 2015-06-04 MED ORDER — IPRATROPIUM-ALBUTEROL 0.5-2.5 (3) MG/3ML IN SOLN
3.0000 mL | RESPIRATORY_TRACT | Status: DC | PRN
  Administered 2015-06-05: 3 mL via RESPIRATORY_TRACT
  Filled 2015-06-04: qty 3

## 2015-06-04 NOTE — Progress Notes (Signed)
Post hd tx 

## 2015-06-04 NOTE — Progress Notes (Signed)
Pre-hd tx 

## 2015-06-04 NOTE — Plan of Care (Signed)
RN called and informed Dialysis RN about recent episode of put feeling difficulty breathing.  Pt sent down to dialysis on 3L continuous [place]  and report given.  Dialysis RN agreed to contact [facility] (if needed) to have pt placed back on CPAP.

## 2015-06-04 NOTE — Progress Notes (Signed)
Patient did not sleep well overnight. Given PRN Oxy IR twice for pain and is currently sleeping. Patient is too lethargic to administer 0600 dose of Catapres, due time changed to 0700. Nursing staff will continue to monitor. Lamonte RicherKara A Aleksandr Pellow, RN

## 2015-06-04 NOTE — Care Management Note (Signed)
[name]  She just recently in past month started dialysis and there is a plan to switch to PD once patient is stable in outpatient setting.  Ivor ReiningKim Ninette Cotta  Dialysis Liaison   [phone]

## 2015-06-04 NOTE — Plan of Care (Signed)
Pt complaining of diff breathing. RN went into room and assessed pt.  Diminished bilat lungs. BP 128/40 and SPO2 91% on CPAP 3L.  Resp was also in room and changed pt to CPAP.  Pt A/O and displayed no s/sx of distress but still voiced discomfort.  Dr. Bevely PalmerMade aware.  Dr. Bluford MainFeels pt probably would benefit from dialysis today and also agreed to [facility] for pain at this time. RN will continue to assess pt's needs.

## 2015-06-04 NOTE — Progress Notes (Signed)
SUBJECTIVE: patient is breathing much better today   Filed Vitals:   06/04/15 0413 06/04/15 0434 06/04/15 0603 06/04/15 0605  BP:   160/42   Pulse:   75 84  Temp:    98.5 F (36.9 C)  TempSrc:    Oral  Resp:    22  Height:      Weight:  200 lb 13.4 oz (91.1 kg)    SpO2: 97%   95%    Intake/Output Summary (Last 24 hours) at 06/04/15 0855 Last data filed at 06/04/15 0113  Gross per 24 hour  Intake      0 ml  Output   2000 ml  Net  -2000 ml    LABS: Basic Metabolic Panel:  Recent Labs  62/13/0810/29/16 1554 06/02/15 0542 06/03/15 0714  NA 136 139 138  K 4.1 3.5 3.8  CL 95* 99* 97*  CO2 35* 37* 37*  GLUCOSE 182* 92 114*  BUN 16 9 15   CREATININE 1.64* 1.17* 1.58*  CALCIUM 9.2 9.0 9.6  PHOS 3.3  --   --    Liver Function Tests:  Recent Labs  06/01/15 1554  ALBUMIN 2.5*   No results for input(s): LIPASE, AMYLASE in the last 72 hours. CBC:  Recent Labs  06/02/15 0542 06/03/15 0714  HGB 7.8* 8.0*   Cardiac Enzymes:  Recent Labs  06/03/15 0714  TROPONINI <0.03   BNP: Invalid input(s): POCBNP D-Dimer: No results for input(s): DDIMER in the last 72 hours. Hemoglobin A1C: No results for input(s): HGBA1C in the last 72 hours. Fasting Lipid Panel: No results for input(s): CHOL, HDL, LDLCALC, TRIG, CHOLHDL, LDLDIRECT in the last 72 hours. Thyroid Function Tests: No results for input(s): TSH, T4TOTAL, T3FREE, THYROIDAB in the last 72 hours.  Invalid input(s): FREET3 Anemia Panel: No results for input(s): VITAMINB12, FOLATE, FERRITIN, TIBC, IRON, RETICCTPCT in the last 72 hours.   PHYSICAL EXAM General: Well developed, well nourished, in no acute distress HEENT:  Normocephalic and atramatic Neck:  No JVD.  Lungs: Clear bilaterally to auscultation and percussion. Heart: HRRR . Normal S1 and S2 without gallops or murmurs.  Abdomen: Bowel sounds are positive, abdomen soft and non-tender  Msk:  Back normal, normal gait. Normal strength and tone for  age. Extremities: No clubbing, cyanosis or edema.   Neuro: Alert and oriented X 3. Psych:  Good affect, responds appropriately  TELEMETRY: sinus rhythm  ASSESSMENT AND PLAN: CHF/idioventricular rhythm, now in NSR and breathing improved significantly since yesterday.  Active Problems:   Altered mental status    KHAN,SHAUKAT A, MD, [facility] 06/04/2015 8:55 AM

## 2015-06-04 NOTE — Care Management (Signed)
Spoke with patient's daughter Kristina Wilson in regards to agency choice for [facility]. [facility] but agency did not keep up with the supplies.  [facility]ency [facility] is [facility].   Agency will start the referral process.

## 2015-06-04 NOTE — Clinical Social Work Note (Signed)
CSW [facility] with Selena BattenKim at [facility] and [facility] are still unable to offer. [facility]. York SpanielMonica Rameen Gohlke MSW,LCSW [phone]

## 2015-06-04 NOTE — Progress Notes (Signed)
PT Cancellation Note  Patient Details Name: Kristina Wilson MRN: [id number] DOB: 29-Nov-1922   Cancelled Treatment:    Reason Eval/Treat Not Completed: Patient at procedure or test/unavailable   [name] 06/04/2015, 2:58 PM

## 2015-06-04 NOTE — Progress Notes (Signed)
Subjective:   Patient underwent dialysis yesterday. Tolerated well.  Breathing is better today 2000 cc of fluid was removed with dialysis .   Objective:  Vital signs in last 24 hours:  Temp:  [98 F (36.7 C)-98.6 F (37 C)] 98.5 F (36.9 C) (11/01 0605) Pulse Rate:  [65-104] 84 (11/01 0605) Resp:  [14-22] 22 (11/01 0605) BP: (111-186)/(42-89) 164/46 mmHg (11/01 0954) SpO2:  [80 %-100 %] 95 % (11/01 0605) Weight:  [91.1 kg (200 lb 13.4 oz)] 91.1 kg (200 lb 13.4 oz) (11/01 0434)  Weight change: 0.6 kg (1 lb 5.2 oz) Filed Weights   06/03/15 0500 06/03/15 0930 06/04/15 0434  Weight: 94.5 kg (208 lb 5.4 oz) 95.1 kg (209 lb 10.5 oz) 91.1 kg (200 lb 13.4 oz)    Intake/Output: I/O last 3 completed shifts: In: -  Out: 2000 [Other:2000]   Intake/Output this shift:     Physical Exam: General: NAD,  Frail elderly  Head:  Moist oral mucosal membranes  Eyes: Anicteric  Neck: Supple, trachea midline  Lungs:  Mild scattered rhonchi, normal effort  Heart: Irregular, no rubs  Abdomen:  Soft, nontender,   Extremities:  trace peripheral edema, left leg wound bandaged  Neurologic: Awake, will follow simple commands  Skin: No acute lesions  Access: Rt IJ PC,     Basic Metabolic Panel:  Recent Labs Lab 05/29/15 1303 05/30/15 0407 05/30/15 1250 05/31/15 0334 06/01/15 0601 06/01/15 1554 06/02/15 0542 06/03/15 0714  NA 133* 133*  --   --   --  136 139 138  K 4.7 2.8*  --  3.1* 3.2* 4.1 3.5 3.8  CL 94* 94*  --   --   --  95* 99* 97*  CO2 27 33*  --   --   --  35* 37* 37*  GLUCOSE 106* 92  --   --   --  182* 92 114*  BUN 10 15  --   --   --  CREATININE 1.75* 1.88*  --   --   --  1.64* 1.17* 1.58*  CALCIUM 8.9 8.9  --   --   --  9.2 9.0 9.6  MG  --   --   --   --  1.9  --   --   --   PHOS  --   --  3.5  --   --  3.3  --   --     Liver Function Tests:  Recent Labs Lab 05/29/15 1303 06/01/15 1554  AST 38  --   ALT 10*  --   ALKPHOS 96  --   BILITOT 1.0  --    PROT 6.1*  --   ALBUMIN 2.9* 2.5*   No results for input(s): LIPASE, AMYLASE in the last 168 hours. No results for input(s): AMMONIA in the last 168 hours.  CBC:  Recent Labs Lab 05/29/15 1303 05/30/15 0407 05/31/15 0334 06/01/15 0601 06/02/15 0542 06/03/15 0714  WBC 9.3 8.7 8.0 7.6  --   --   HGB 8.8* 7.6* 8.2* 8.7* 7.8* 8.0*  HCT 26.8* 22.8* 25.1* 26.2*  --   --   MCV 95.8 95.5 95.3 95.9  --   --   PLT 361 348 352 367  --   --     Cardiac Enzymes:  Recent Labs Lab 05/29/15 1303 06/01/15 0601 06/03/15 0714  CKTOTAL  --  9*  --   TROPONINI 0.05*  --  <0.03  BNP: Invalid input(s): POCBNP  CBG:  Recent Labs Lab 05/31/15 0730 06/01/15 0759 06/02/15 0759 06/03/15 0806 06/04/15 0731  GLUCAP 89 93 95 110* 84    Microbiology: Results for orders placed or performed during the hospital encounter of 05/29/15  Urine culture     Status: None   Collection Time: 05/29/15 12:00 PM  Result Value Ref Range Status   Specimen Description URINE, CATHETERIZED  Final   Special Requests NONE  Final   Culture MULTIPLE SPECIES PRESENT, SUGGEST RECOLLECTION  Final   Report Status 05/31/2015 FINAL  Final  Urine culture     Status: None   Collection Time: 06/01/15 10:00 AM  Result Value Ref Range Status   Specimen Description URINE, CATHETERIZED  Final   Special Requests Normal  Final   Culture NO GROWTH 2 DAYS  Final   Report Status 06/03/2015 FINAL  Final    Coagulation Studies: No results for input(s): LABPROT, INR in the last 72 hours.  Urinalysis: No results for input(s): COLORURINE, LABSPEC, PHURINE, GLUCOSEU, HGBUR, BILIRUBINUR, KETONESUR, PROTEINUR, UROBILINOGEN, NITRITE, LEUKOCYTESUR in the last 72 hours.  Invalid input(s): APPERANCEUR    Imaging: Dg Chest Port 1 View  06/03/2015  CLINICAL DATA:  Onset of shortness of breath last evening, history of CHF and pneumonia, previous tobacco use. EXAM: PORTABLE CHEST 1 VIEW COMPARISON:  PA and lateral chest  x-ray of May 29, 2015 FINDINGS: The lungs are hypoinflated. There are moderate-sized bilateral pleural effusions. The pulmonary vascularity is engorged and the pulmonary interstitial markings are increased. The cardiac silhouette remains enlarged. The dialysis type catheter is in reasonable position with the tip at the cavoatrial junction. IMPRESSION: Congestive heart failure with pulmonary interstitial edema and moderate-sized pleural effusions. There has been mild deterioration in the appearance of the chest since May 29, 2015. Electronically Signed   By: Kristina  Wilson M.D.   On: 06/03/2015 09:02     Medications:     . amLODipine  10 mg Oral QPC supper  . aspirin EC  81 mg Oral Daily  . carvedilol  12.5 mg Oral Q12H  . cloNIDine  0.1 mg Oral 3 times per day  . cyanocobalamin  500 mcg Oral Daily  . docusate sodium  100 mg Oral BID  . epoetin (EPOGEN/PROCRIT) injection  10,000 Units Intravenous Q T,Th,Sa-HD  . famotidine  10 mg Oral Daily  . feeding supplement (NEPRO CARB STEADY)  237 mL Oral BID WC  . fluticasone  2 spray Each Nare Daily  . heparin  5,000 Units Subcutaneous 3 times per day  . hydrALAZINE  100 mg Oral TID  . ipratropium-albuterol  3 mL Nebulization Q4H  . iron polysaccharides  150 mg Oral Daily  . latanoprost  1 drop Both Eyes QHS  . lidocaine  1 patch Transdermal Q24H  . loratadine  10 mg Oral Daily  . multivitamin with minerals  1 tablet Oral Daily  . multivitamin-lutein  1 capsule Oral BID  . pantoprazole  40 mg Oral BID  . phenytoin  300 mg Oral QHS  . sodium chloride  3 mL Intravenous Q12H   sodium chloride, sodium chloride, acetaminophen, alteplase, alum & mag hydroxide-simeth, bisacodyl, bisacodyl, heparin, lidocaine (PF), lidocaine-prilocaine, ondansetron **OR** ondansetron (ZOFRAN) IV, oxyCODONE, promethazine, sodium chloride  Assessment/ Plan:  [name] with past medical history of hypertension, obstructive  sleep apnea, DJD, CVA with residual left sided weakness, carotid stenosis status post L CEA 2/15, seizure disorder, diastolic  congestive heart failure, anemia  [facility] TTS [facility]  1. End Stage Renal Disease: tolerated dialysis well yesterday. Continue TTS schedule.  Ultimate plan is for patient to transition to PD however patient is currently at a SNF and appears quite debilitated.  - 2000 cc of fluid was removed with dialysis yesterday Patient feels overall better  2. Hypertension: at goal. Allergy to enalapril (causes anaphylaxis). -  Continue current regimen: furosemide, hydralazine, clonidine, carvedilol and amlodipine.   3. Secondary hyperparathyroidism. PTH 100, phos 3.3,  - no indication for binders or vitamin D agent.   4. Anemia chronic kidney disease: hemoglobin 8.0 - epogen 10000 units IV with HD. - iron supplements  5.  Urinary Tract infection with encephalopathy:  Improved mental status now  6. Hypoalbuminemia: concerning. Supplement with Nepro.   LOS: 6  Erlinda Solinger 11/1/201611:36 AM

## 2015-06-04 NOTE — Telephone Encounter (Signed)
I have been reviewing pt ever since daughter called 9/22. Pt has been in hospital twice and got catheter placed to begin dialysis.  I had sent an inbasket message to make appt for pt because she had appts but they were never made and they were for lab only appt.  I got a message sent back to me stating the appts that were not made are all past the date of current month so they need new directions.  The daughter was contemplating procrit for her Mom even though it is high risk for her because of previous stroke.  She is now on dialysis and gets epogen at dialysis so therefore she does not need to come back to cancer center unless she develops a new problem or hgb gets so low that she needs blood transfusion.  Pt currently in hospital right now

## 2015-06-04 NOTE — Progress Notes (Addendum)
Patient ID: Kristina Wilson, female   DOB: 11/07/1922, 81 y.o.   MRN: [id number] [facility] PROGRESS NOTE  PCP: Jaclyn Shaggy, MD  HPI/Subjective: Patient having tail bone pain. Short of breath. As per the family was doing well this morning. Then she developed the shortness of breath. She cannot describe it any further.  Objective: Filed Vitals:   06/04/15 1148  BP: 128/40  Pulse:   Temp:   Resp:     Filed Weights   06/03/15 0500 06/03/15 0930 06/04/15 0434  Weight: 94.5 kg (208 lb 5.4 oz) 95.1 kg (209 lb 10.5 oz) 91.1 kg (200 lb 13.4 oz)    ROS: Review of Systems  Constitutional: Negative for fever and chills.  Eyes: Negative for blurred vision.  Respiratory: Positive for shortness of breath. Negative for cough.   Cardiovascular: Negative for chest pain.  Gastrointestinal: Positive for abdominal pain. Negative for nausea, vomiting, diarrhea and constipation.  Genitourinary: Negative for dysuria.  Musculoskeletal: Positive for joint pain.  Neurological: Negative for dizziness and headaches.   Exam: Physical Exam  HENT:  Nose: No mucosal edema.  Mouth/Throat: No oropharyngeal exudate or posterior oropharyngeal edema.  Eyes: Conjunctivae, EOM and lids are normal. Pupils are equal, round, and reactive to light.  Neck: No JVD present. Carotid bruit is not present.  Cardiovascular: S1 normal and S2 normal.  Exam reveals no gallop.   No murmur heard. Pulses:      Dorsalis pedis pulses are 2+ on the right side, and 2+ on the left side.  Respiratory: Accessory muscle usage present. She is in respiratory distress. She has decreased breath sounds in the right upper field, the right middle field, the right lower field, the left upper field, the left middle field and the left lower field. She has no wheezes. She has rhonchi in the right lower field and the left lower field. She has no rales.  GI: Soft. Bowel sounds are normal. There is tenderness in the left lower  quadrant.  Musculoskeletal:       Right ankle: She exhibits swelling.       Left ankle: She exhibits swelling.  Lymphadenopathy:    She has no cervical adenopathy.  Neurological: She is alert.  Skin: Skin is warm. No rash noted. Nails show no clubbing.  Bruising left knee.  Psychiatric: She has a normal mood and affect.    Data Reviewed: Basic Metabolic Panel:  Recent Labs Lab 05/29/15 1303 05/30/15 0407 05/30/15 1250 05/31/15 0334 06/01/15 0601 06/01/15 1554 06/02/15 0542 06/03/15 0714  NA 133* 133*  --   --   --  136 139 138  K 4.7 2.8*  --  3.1* 3.2* 4.1 3.5 3.8  CL 94* 94*  --   --   --  95* 99* 97*  CO2 27 33*  --   --   --  35* 37* 37*  GLUCOSE 106* 92  --   --   --  182* 92 114*  BUN 10 15  --   --   --  CREATININE 1.75* 1.88*  --   --   --  1.64* 1.17* 1.58*  CALCIUM 8.9 8.9  --   --   --  9.2 9.0 9.6  MG  --   --   --   --  1.9  --   --   --   PHOS  --   --  3.5  --   --  3.3  --   --  Liver Function Tests:  Recent Labs Lab 05/29/15 1303 06/01/15 1554  AST 38  --   ALT 10*  --   ALKPHOS 96  --   BILITOT 1.0  --   PROT 6.1*  --   ALBUMIN 2.9* 2.5*  CBC:  Recent Labs Lab 05/29/15 1303 05/30/15 0407 05/31/15 0334 06/01/15 0601 06/02/15 0542 06/03/15 0714  WBC 9.3 8.7 8.0 7.6  --   --   HGB 8.8* 7.6* 8.2* 8.7* 7.8* 8.0*  HCT 26.8* 22.8* 25.1* 26.2*  --   --   MCV 95.8 95.5 95.3 95.9  --   --   PLT 361 348 352 367  --   --    CBG:  Recent Labs Lab 05/31/15 0730 06/01/15 0759 06/02/15 0759 06/03/15 0806 06/04/15 0731  GLUCAP 89 93 95 110* 84    Recent Results (from the past 240 hour(s))  Urine culture     Status: None   Collection Time: 05/29/15 12:00 PM  Result Value Ref Range Status   Specimen Description URINE, CATHETERIZED  Final   Special Requests NONE  Final   Culture MULTIPLE SPECIES PRESENT, SUGGEST RECOLLECTION  Final   Report Status 05/31/2015 FINAL  Final  Urine culture     Status: None   Collection Time:  06/01/15 10:00 AM  Result Value Ref Range Status   Specimen Description URINE, CATHETERIZED  Final   Special Requests Normal  Final   Culture NO GROWTH 2 DAYS  Final   Report Status 06/03/2015 FINAL  Final     Scheduled Meds: . amLODipine  10 mg Oral QPC supper  . aspirin EC  81 mg Oral Daily  . carvedilol  12.5 mg Oral Q12H  . cloNIDine  0.1 mg Oral 3 times per day  . cyanocobalamin  500 mcg Oral Daily  . docusate sodium  100 mg Oral BID  . epoetin (EPOGEN/PROCRIT) injection  10,000 Units Intravenous Q T,Th,Sa-HD  . famotidine  10 mg Oral Daily  . feeding supplement (NEPRO CARB STEADY)  237 mL Oral BID WC  . fentaNYL  12.5 mcg Transdermal Q72H  . fluticasone  2 spray Each Nare Daily  . [START ON 06/05/2015] furosemide  80 mg Oral Daily  . heparin  5,000 Units Subcutaneous 3 times per day  . hydrALAZINE  100 mg Oral TID  . ipratropium-albuterol  3 mL Nebulization Q4H  . iron polysaccharides  150 mg Oral Daily  . latanoprost  1 drop Both Eyes QHS  . lidocaine  1 patch Transdermal Q24H  . loratadine  10 mg Oral Daily  .  morphine injection  2 mg Intravenous Once  . multivitamin with minerals  1 tablet Oral Daily  . multivitamin-lutein  1 capsule Oral BID  . pantoprazole  40 mg Oral BID  . phenytoin  300 mg Oral QHS  . sodium chloride  3 mL Intravenous Q12H    Assessment/Plan:  1. Acute on chronic respiratory failure with hypoxia and hypercarbia. Acute diastolic congestive heart failure and fluid overload. Patient using accessory muscles to breathe. Spoke with nephrology, patient will be dialyzed today again. Since she is a CO2 retainer try to keep oxygen at the lowest minimal.  Could not tolerate BiPAP last night. Will have care manager look into Trilogy. 2. Acute encephalopathy with lethargy. Thought initially to be secondary to acute cystitis with hematuria. Urine culture grew out multiple organisms which is a contaminant. Repeat urine culture is negative and antibiotics were  stopped. Could also be  secondary to CO2 retention and polypharmacy.  3. Acute cystitis with hematuria. Stop antibiotics 4. End-stage renal disease on dialysis Tuesday Thursday and Saturday. We'll speak with nephrology about an extra dialysis today. 5. Seizure history. On Dilantin. Level subtherapeutic but no signs of seizure. 6. Essential hypertension. Blood pressure stable on Coreg, amlodipine, clonidine and hydralazine 7. Neuropathy - gabapentin stopped 8. Glaucoma unspecified on Xalatan 9. Gastroesophageal reflux disease without esophagitis on Protonix 10. Sleep apnea on nasal CPAP at night 11. Weakness - PT recommended rehabilitation 12. Anemia of chronic disease- on Procrit with dialysis. 13. Chronic pain - oral oxycodone, one dose of IV morphine restart low-dose fentanyl patch. 14. Patient also on chronic prednisone  Code Status:     Code Status Orders        Start     Ordered   05/29/15 1601  Full code   Continuous     05/29/15 1605    Advance Directive Documentation        Most Recent Value   Type of Advance Directive  Healthcare Power of [facility] [Kelly [place], daughter]   Pre-existing out of facility DNR order (yellow form or pink MOST form)     "MOST" Form in Place?       Family Communication: Family at bedside  Disposition Plan: To be determined  Time spent: 25 minutes  Saia HighlandWIETING, Amariah Kierstead  [facility]

## 2015-06-04 NOTE — Progress Notes (Signed)
HD tx start 

## 2015-06-04 NOTE — Progress Notes (Signed)
Patient has been going in and out of the same ventricular rhythm as last night, still asymptomatic, and patient remains in sinus rhythm during the day. Per patient's granddaughter, the patient did this as well during her last admission. Spoke with Dr. Anne HahnWillis and was told to continue monitoring the patient since she does not present any signs of distress. Nursing staff will continue to monitor. Lamonte RicherKara A Yobany Vroom, RN

## 2015-06-04 NOTE — Progress Notes (Signed)
HD tx completed.

## 2015-06-05 LAB — BASIC METABOLIC PANEL
Anion gap: 6 (ref 5–15)
BUN: 8 mg/dL (ref 6–20)
CHLORIDE: 98 mmol/L — AB (ref 101–111)
CO2: 32 mmol/L (ref 22–32)
CREATININE: 1.32 mg/dL — AB (ref 0.44–1.00)
Calcium: 9.2 mg/dL (ref 8.9–10.3)
GFR calc Af Amer: 40 mL/min — ABNORMAL LOW (ref >60)
GFR calc non Af Amer: 34 mL/min — ABNORMAL LOW (ref >60)
GLUCOSE: 85 mg/dL (ref 65–99)
Potassium: 3.4 mmol/L — ABNORMAL LOW (ref 3.5–5.1)
SODIUM: 136 mmol/L (ref 135–145)

## 2015-06-05 LAB — GLUCOSE, CAPILLARY: GLUCOSE-CAPILLARY: 80 mg/dL (ref 65–99)

## 2015-06-05 LAB — HEMOGLOBIN: HEMOGLOBIN: 7.7 g/dL — AB (ref 12.0–16.0)

## 2015-06-05 MED ORDER — HEPARIN SODIUM (PORCINE) 5000 UNIT/ML IJ SOLN
5000.0000 [IU] | Freq: Two times a day (BID) | INTRAMUSCULAR | Status: DC
  Administered 2015-06-05: 5000 [IU] via SUBCUTANEOUS
  Filled 2015-06-05: qty 1

## 2015-06-05 MED ORDER — MORPHINE SULFATE (PF) 2 MG/ML IV SOLN
2.0000 mg | INTRAVENOUS | Status: DC | PRN
  Administered 2015-06-05 – 2015-06-06 (×2): 2 mg via INTRAVENOUS
  Filled 2015-06-05 (×2): qty 1

## 2015-06-05 NOTE — Progress Notes (Signed)
Subjective:   Patient underwent extra dialysis yesterday. Tolerated well. 1500 cc was removed Breathing is better today Reports pain in the lower back .   Objective:  Vital signs in last 24 hours:  Temp:  [98 F (36.7 C)-98.9 F (37.2 C)] 98 F (36.7 C) (11/02 1139) Pulse Rate:  [62-86] 69 (11/02 1139) Resp:  [14-20] 18 (11/02 1139) BP: (110-188)/(26-70) 148/70 mmHg (11/02 1139) SpO2:  [90 %-100 %] 94 % (11/02 1139) Weight:  [90.9 kg (200 lb 6.4 oz)] 90.9 kg (200 lb 6.4 oz) (11/02 0421)  Weight change: -4.2 kg (-9 lb 4.2 oz) Filed Weights   06/03/15 0930 06/04/15 0434 06/05/15 0421  Weight: 95.1 kg (209 lb 10.5 oz) 91.1 kg (200 lb 13.4 oz) 90.9 kg (200 lb 6.4 oz)    Intake/Output: I/O last 3 completed shifts: In: -  Out: 1500 [Other:1500]   Intake/Output this shift:     Physical Exam: General: NAD,  Frail elderly  Head:  Moist oral mucosal membranes  Eyes: Anicteric  Neck: Supple, trachea midline  Lungs:  Mild scattered rhonchi, normal effort  Heart: Irregular, no rubs  Abdomen:  Soft, nontender,   Extremities:  trace peripheral edema, left leg wound bandaged  Neurologic: Awake, will follow simple commands  Skin: No acute lesions  Access: Rt IJ PC,     Basic Metabolic Panel:  Recent Labs Lab 05/30/15 0407 05/30/15 1250  06/01/15 0601 06/01/15 1554 06/02/15 0542 06/03/15 0714 06/05/15 0742  NA 133*  --   --   --  136 139 138 136  K 2.8*  --   < > 3.2* 4.1 3.5 3.8 3.4*  CL 94*  --   --   --  95* 99* 97* 98*  CO2 33*  --   --   --  35* 37* 37* 32  GLUCOSE 92  --   --   --  182* 92 114* 85  BUN 15  --   --   --  CREATININE 1.88*  --   --   --  1.64* 1.17* 1.58* 1.32*  CALCIUM 8.9  --   --   --  9.2 9.0 9.6 9.2  MG  --   --   --  1.9  --   --   --   --   PHOS  --  3.5  --   --  3.3  --   --   --   < > = values in this interval not displayed.  Liver Function Tests:  Recent Labs Lab 06/01/15 1554  ALBUMIN 2.5*   No results for  input(s): LIPASE, AMYLASE in the last 168 hours. No results for input(s): AMMONIA in the last 168 hours.  CBC:  Recent Labs Lab 05/30/15 0407 05/31/15 0334 06/01/15 0601 06/02/15 0542 06/03/15 0714 06/05/15 0742  WBC 8.7 8.0 7.6  --   --   --   HGB 7.6* 8.2* 8.7* 7.8* 8.0* 7.7*  HCT 22.8* 25.1* 26.2*  --   --   --   MCV 95.5 95.3 95.9  --   --   --   PLT 348 352 367  --   --   --     Cardiac Enzymes:  Recent Labs Lab 06/01/15 0601 06/03/15 0714  CKTOTAL 9*  --   TROPONINI  --  <0.03    BNP: Invalid input(s): POCBNP  CBG:  Recent Labs Lab 06/01/15 0759 06/02/15 0759 06/03/15 0806 06/04/15 0731 06/05/15  0740  GLUCAP 93 95 110* 84 80    Microbiology: Results for orders placed or performed during the hospital encounter of 05/29/15  Urine culture     Status: None   Collection Time: 05/29/15 12:00 PM  Result Value Ref Range Status   Specimen Description URINE, CATHETERIZED  Final   Special Requests NONE  Final   Culture MULTIPLE SPECIES PRESENT, SUGGEST RECOLLECTION  Final   Report Status 05/31/2015 FINAL  Final  Urine culture     Status: None   Collection Time: 06/01/15 10:00 AM  Result Value Ref Range Status   Specimen Description URINE, CATHETERIZED  Final   Special Requests Normal  Final   Culture NO GROWTH 2 DAYS  Final   Report Status 06/03/2015 FINAL  Final    Coagulation Studies: No results for input(s): LABPROT, INR in the last 72 hours.  Urinalysis: No results for input(s): COLORURINE, LABSPEC, PHURINE, GLUCOSEU, HGBUR, BILIRUBINUR, KETONESUR, PROTEINUR, UROBILINOGEN, NITRITE, LEUKOCYTESUR in the last 72 hours.  Invalid input(s): APPERANCEUR    Imaging: No results found.   Medications:     . amLODipine  10 mg Oral QPC supper  . aspirin EC  81 mg Oral Daily  . carvedilol  12.5 mg Oral Q12H  . cloNIDine  0.1 mg Oral 3 times per day  . cyanocobalamin  500 mcg Oral Daily  . docusate sodium  100 mg Oral BID  . epoetin  (EPOGEN/PROCRIT) injection  10,000 Units Intravenous Q T,Th,Sa-HD  . feeding supplement (NEPRO CARB STEADY)  237 mL Oral BID WC  . fentaNYL  12.5 mcg Transdermal Q72H  . fluticasone  2 spray Each Nare Daily  . furosemide  80 mg Oral Daily  . heparin  5,000 Units Subcutaneous Q12H  . hydrALAZINE  100 mg Oral TID  . ipratropium-albuterol  3 mL Nebulization QID  . iron polysaccharides  150 mg Oral Daily  . latanoprost  1 drop Both Eyes QHS  . lidocaine  1 patch Transdermal Q24H  . loratadine  10 mg Oral Daily  . multivitamin with minerals  1 tablet Oral Daily  . multivitamin-lutein  1 capsule Oral BID  . pantoprazole  40 mg Oral BID  . phenytoin  300 mg Oral QHS  . predniSONE  10 mg Oral Q breakfast  . sodium chloride  3 mL Intravenous Q12H   sodium chloride, sodium chloride, acetaminophen, alteplase, alum & mag hydroxide-simeth, bisacodyl, bisacodyl, heparin, ipratropium-albuterol, lidocaine (PF), lidocaine-prilocaine, morphine injection, ondansetron **OR** ondansetron (ZOFRAN) IV, oxyCODONE, promethazine, sodium chloride  Assessment/ Plan:  Kristina Wilson is a 81 y.o. white female with past medical history of hypertension, obstructive sleep apnea, DJD, CVA with residual left sided weakness, carotid stenosis status post L CEA 2/15, seizure disorder, diastolic congestive heart failure, anemia  [facility] TTS [facility]  1. End Stage Renal Disease: tolerated dialysis well yesterday. Continue TTS schedule.  Ultimate plan is for patient to transition to PD however patient is currently at a SNF and appears quite debilitated.  - 1500 cc of fluid was removed with dialysis yesterday Patient feels overall better  2. Hypertension: at goal. Allergy to enalapril (causes anaphylaxis). -  Continue current regimen: furosemide, hydralazine, clonidine, carvedilol and amlodipine.   3. Secondary hyperparathyroidism. PTH 100, phos 3.3,  - no indication for binders or vitamin D agent.   4. Anemia  chronic kidney disease: hemoglobin 7.7 - epogen 10000 units IV with HD. - iron supplements  5.  Urinary Tract infection with encephalopathy:  Improved  mental status now  6. Hypoalbuminemia: concerning. Supplement with Nepro.   LOS: 7  Kristina Wilson 11/2/20162:25 PM

## 2015-06-05 NOTE — Progress Notes (Signed)
Patient ID: Kristina Wilson, female   DOB: [birth year], 81 y.o.   MRN: [id number] [facility] PROGRESS NOTE  PCP: Jaclyn Shaggy, MD  HPI/Subjective: Patient not complaining of shortness of breath today. She is complaining of her legs jumping and asking for some vinegar. She is having severe tailbone pain.  Objective: Filed Vitals:   06/05/15 1139  BP: 148/70  Pulse: 69  Temp: 98 F (36.7 C)  Resp: 18    Filed Weights   06/03/15 0930 06/04/15 0434 06/05/15 0421  Weight: 95.1 kg (209 lb 10.5 oz) 91.1 kg (200 lb 13.4 oz) 90.9 kg (200 lb 6.4 oz)    ROS: Review of Systems  Constitutional: Negative for fever and chills.  Eyes: Negative for blurred vision.  Respiratory: Positive for shortness of breath. Negative for cough.   Cardiovascular: Negative for chest pain.  Gastrointestinal: Positive for abdominal pain. Negative for nausea, vomiting, diarrhea and constipation.  Genitourinary: Negative for dysuria.  Musculoskeletal: Positive for myalgias, back pain and joint pain.  Neurological: Negative for dizziness and headaches.   Exam: Physical Exam  Constitutional: She is oriented to person, place, and time.  HENT:  Nose: No mucosal edema.  Mouth/Throat: No oropharyngeal exudate or posterior oropharyngeal edema.  Eyes: Conjunctivae, EOM and lids are normal. Pupils are equal, round, and reactive to light.  Neck: No JVD present. Carotid bruit is not present.  Cardiovascular: S1 normal and S2 normal.  Exam reveals no gallop.   No murmur heard. Pulses:      Dorsalis pedis pulses are 2+ on the right side, and 2+ on the left side.  Respiratory: No accessory muscle usage. No respiratory distress. She has decreased breath sounds in the right lower field and the left lower field. She has no wheezes. She has rhonchi in the right lower field and the left lower field. She has no rales.  GI: Soft. Bowel sounds are normal. There is no tenderness.  Musculoskeletal:       Right  ankle: She exhibits swelling.       Left ankle: She exhibits swelling.  Lymphadenopathy:    She has no cervical adenopathy.  Neurological: She is alert and oriented to person, place, and time.  Skin: Skin is warm. No rash noted. Nails show no clubbing.  Bruising left knee.  Psychiatric: She has a normal mood and affect.    Data Reviewed: Basic Metabolic Panel:  Recent Labs Lab 05/30/15 0407 05/30/15 1250  06/01/15 0601 06/01/15 1554 06/02/15 0542 06/03/15 0714 06/05/15 0742  NA 133*  --   --   --  136 139 138 136  K 2.8*  --   < > 3.2* 4.1 3.5 3.8 3.4*  CL 94*  --   --   --  95* 99* 97* 98*  CO2 33*  --   --   --  35* 37* 37* 32  GLUCOSE 92  --   --   --  182* 92 114* 85  BUN 15  --   --   --  CREATININE 1.88*  --   --   --  1.64* 1.17* 1.58* 1.32*  CALCIUM 8.9  --   --   --  9.2 9.0 9.6 9.2  MG  --   --   --  1.9  --   --   --   --   PHOS  --  3.5  --   --  3.3  --   --   --   < > =  values in this interval not displayed. Liver Function Tests:  Recent Labs Lab 06/01/15 1554  ALBUMIN 2.5*  CBC:  Recent Labs Lab 05/30/15 0407 05/31/15 0334 06/01/15 0601 06/02/15 0542 06/03/15 0714 06/05/15 0742  WBC 8.7 8.0 7.6  --   --   --   HGB 7.6* 8.2* 8.7* 7.8* 8.0* 7.7*  HCT 22.8* 25.1* 26.2*  --   --   --   MCV 95.5 95.3 95.9  --   --   --   PLT 348 352 367  --   --   --    CBG:  Recent Labs Lab 06/01/15 0759 06/02/15 0759 06/03/15 0806 06/04/15 0731 06/05/15 0740  GLUCAP 93 95 110* 84 80    Recent Results (from the past 240 hour(s))  Urine culture     Status: None   Collection Time: 05/29/15 12:00 PM  Result Value Ref Range Status   Specimen Description URINE, CATHETERIZED  Final   Special Requests NONE  Final   Culture MULTIPLE SPECIES PRESENT, SUGGEST RECOLLECTION  Final   Report Status 05/31/2015 FINAL  Final  Urine culture     Status: None   Collection Time: 06/01/15 10:00 AM  Result Value Ref Range Status   Specimen Description  URINE, CATHETERIZED  Final   Special Requests Normal  Final   Culture NO GROWTH 2 DAYS  Final   Report Status 06/03/2015 FINAL  Final     Scheduled Meds: . amLODipine  10 mg Oral QPC supper  . aspirin EC  81 mg Oral Daily  . carvedilol  12.5 mg Oral Q12H  . cloNIDine  0.1 mg Oral 3 times per day  . cyanocobalamin  500 mcg Oral Daily  . docusate sodium  100 mg Oral BID  . epoetin (EPOGEN/PROCRIT) injection  10,000 Units Intravenous Q T,Th,Sa-HD  . famotidine  10 mg Oral Daily  . feeding supplement (NEPRO CARB STEADY)  237 mL Oral BID WC  . fentaNYL  12.5 mcg Transdermal Q72H  . fluticasone  2 spray Each Nare Daily  . furosemide  80 mg Oral Daily  . heparin  5,000 Units Subcutaneous Q12H  . hydrALAZINE  100 mg Oral TID  . ipratropium-albuterol  3 mL Nebulization QID  . iron polysaccharides  150 mg Oral Daily  . latanoprost  1 drop Both Eyes QHS  . lidocaine  1 patch Transdermal Q24H  . loratadine  10 mg Oral Daily  . multivitamin with minerals  1 tablet Oral Daily  . multivitamin-lutein  1 capsule Oral BID  . pantoprazole  40 mg Oral BID  . phenytoin  300 mg Oral QHS  . predniSONE  10 mg Oral Q breakfast  . sodium chloride  3 mL Intravenous Q12H    Assessment/Plan:  1. Acute on chronic respiratory failure with hypoxia and hypercarbia. Likely COPD endstage requiring oxygen and CO2 retention. Acute diastolic congestive heart failure and fluid overload. Will have care manager look into Trilogy. Patient breathing more comfortably today and has responded to 2 dialysis treatments on a row. 2. Acute encephalopathy with lethargy. Mental status improved today. Could also be secondary to CO2 retention and polypharmacy.  3. Acute cystitis with hematuria. Stopped antibiotics 4. End-stage renal disease on dialysis Tuesday Thursday and Saturday. 5. Seizure history. On Dilantin. Level subtherapeutic but no signs of seizure. 6. Essential hypertension. Blood pressure stable on Coreg,  amlodipine, clonidine and hydralazine 7. Neuropathy - gabapentin stopped 8. Glaucoma unspecified on Xalatan 9. Gastroesophageal reflux disease without esophagitis  on Protonix 10. Sleep apnea on nasal CPAP at night 11. Weakness - PT recommended rehabilitation 12. Anemia of chronic disease- on Procrit with dialysis. 13. Chronic pain - oral oxycodone, prn IV morphine, low-dose fentanyl patch. 14. Patient also on chronic prednisone  Code Status:     Code Status Orders        Start     Ordered   05/29/15 1601  Full code   Continuous     05/29/15 1605    Advance Directive Documentation        Most Recent Value   Type of Advance Directive  Healthcare Power of [facility] [place], daughter]   Pre-existing out of facility DNR order (yellow form or pink MOST form)     "MOST" Form in Place?       Family Communication: Family yesterday Disposition Plan: Likely rehabilitation in the next few days.  Time spent: 20 minutes  Konitzer Highland  [facility]

## 2015-06-05 NOTE — Progress Notes (Signed)
Physical Therapy Treatment Patient Details Name: Kristina Wilson MRN: [id number] DOB: Aug 21, 1922 Today's Date: 06/05/2015    History of Present Illness Patient is a 81 y.o. female who presents from [facility] on 26 Oct. for unresponsive episode. Patient had recently been treated for UTI. PMH includes ESRD for which she is on hemodialysis and acute on chronic diastolic heart failure.    PT Comments    Pt agreeable to PT. Pt initially does not report pain until daughter specifically points out pain to patient in R hip and tailbone. Pt reports pain then, does not quantify or elaborate. Pt demonstrates good range of motion and strength with exercises except short arc quad requiring active assisted range of motion. Several times during exercises, daughter asks/suggests to patient that pain is limiting; pt then agrees, but shows no physical signs of pain/distress. Offered to get pt up to chair; daughter states pt needs to use lift, noting pt has been bed bound for over a month. Pt tolerates edge of bed for 8 minutes with upper extremity support and Min guard to occasional Min A. Unable to obtain lift; therefore, stand pivot transfer performed. Although total assist, pt does show improved ability to tolerate upright position and follows instruction to help support herself in upright position. Would encourage continued stand pivot transfer versus resorting to lift unless deemed unsafe. Pt demonstrates good motion and fair strength in bilateral lower extremities and would encourage functional use. Pt comfortable up in chair; daughter plans to feeds stating pt cannot feed herself. In my opinion, daughter limits pt's functional ability. Discussed with nursing. Continue PT to progress strength and all functional mobility to allow for decreased assist and improved function.   Follow Up Recommendations  SNF     Equipment Recommendations  None recommended by PT    Recommendations for Other Services        Precautions / Restrictions Restrictions Weight Bearing Restrictions: No    Mobility  Bed Mobility Overal bed mobility: Needs Assistance Bed Mobility: Supine to Sit     Supine to sit: Mod assist     General bed mobility comments: Pt does assist well with use of rails; encouragement to scoot to edge of bed then does show ability  Transfers Overall transfer level: Needs assistance   Transfers: Sit to/from Stand;Stand Pivot Transfers Sit to Stand: Total assist;+2 physical assistance Stand pivot transfers: Total assist;+2 physical assistance       General transfer comment: Initially almost pushes backward, but with instruction works well; stands and bears weight with total assist x 2. Trunk flexed; noted discomfort with initial sit in tailbone due to prior fracture in the spring   Ambulation/Gait             General Gait Details: unable at this time   Stairs            Wheelchair Mobility    Modified Rankin (Stroke Patients Only)       Balance Overall balance assessment: Needs assistance         Standing balance support: Bilateral upper extremity supported Standing balance-Leahy Scale: Zero                      Cognition Arousal/Alertness: Awake/alert Behavior During Therapy: Flat affect Overall Cognitive Status: Within Functional Limits for tasks assessed                      Exercises General Exercises - Lower Extremity Ankle Circles/Pumps: AROM;Both;15  reps;Supine Quad Sets: Strengthening;Both;15 reps;Supine Gluteal Sets: Strengthening;Both;15 reps;Supine Short Arc Quad: AAROM;Both;15 reps;Supine Heel Slides: AROM;Both;15 reps;Supine Hip ABduction/ADduction: AROM;Both;15 reps;Supine Straight Leg Raises: AAROM;Both;10 reps;Supine (partial range) Other Exercises Other Exercises: sitting edge of bed encourageing midline and improved upright posture with BUE support    General Comments        Pertinent Vitals/Pain  Pain Assessment:  (States pain in R hip/tailbone to daughters cue; no quantify ) Pain Location: R hip/tailbone Pain Descriptors / Indicators: Other (Comment) (does not elaborate) Pain Intervention(s): Monitored during session;RN gave pain meds during session    Home Living                      Prior Function            PT Goals (current goals can now be found in the care plan section) Progress towards PT goals: Progressing toward goals    Frequency  Min 2X/week    PT Plan Current plan remains appropriate    Co-evaluation             End of Session Equipment Utilized During Treatment: Gait belt;Oxygen (careful with gait belt due to line in L upper abdomen) Activity Tolerance: Patient tolerated treatment well;Other (comment) (daughter tends to limit pts activity/suggests pain often) Patient left: in chair;with call bell/phone within reach;with chair alarm set;with family/visitor present     Time: [phone] PT Time Calculation (min) (ACUTE ONLY): 41 min  Charges:  $Therapeutic Exercise: 8-22 mins $Therapeutic Activity: 8-22 mins $Neuromuscular Re-education: 8-22 mins                    G Codes:      [name] 06/05/2015, 10:52 AM

## 2015-06-05 NOTE — Progress Notes (Signed)
Order for heparin 5000 units subcutaneously q8h for DVT prophylaxis was changed to heparin 5000 units subcutaneously q12h per anticoagulation protocol due to patient being on HD.

## 2015-06-05 NOTE — Care Management Important Message (Signed)
Important Message  Patient Details  Name: Kristina Wilson MRN: [id number] Date of Birth: [birth year]   Medicare Important Message Given:  Yes-third notification given    Eber HongGreene, Jonathon Tan R, RN 06/05/2015, 10:03 AM

## 2015-06-05 NOTE — Progress Notes (Signed)
Nutrition Follow-up    INTERVENTION:    Meals and Snacks: Cater to patient preferences; pt on Regular diet with thin liquids prior to admission; as mental status improving, recommend upgrading diet to at least Dysphagia III (easy to chew); will order oatmeal on all breakfast trays per request. Medical Food Supplement Therapy: continue Nepro BID, Magic Cup BID Coordination of Care; order placed as reminder for nursing to document po intake as pt is on Isolation; assist pt as needed at meal times  NUTRITION DIAGNOSIS:   Inadequate oral intake related to acute illness as evidenced by meal completion < 50%.   GOAL:   Patient will meet greater than or equal to 90% of their needs   MONITOR:    (Energy Intake, Anthropometrics, Digestive System, Electrolyte/Renal Profile)  REASON FOR ASSESSMENT:   Malnutrition Screening Tool    ASSESSMENT:   Pt more alert today, answering questions appropriately, daughter at bedside  Diet Order:  DIET - DYS 1 Room service appropriate?: Yes; Fluid consistency:: Thin; Fluid restriction:: 1500 mL Fluid   Energy Intake: pt had not eaten anything on breakfast tray this AM; pt sleeping when tray delivered. Pt requesting some oatmeal and coffee; writer called down to obtain new tray. Recorded po intake ~25% of meals but nothing documented since 10/30. Daughter at beside and reports that breakfast is pt's best meal this admission, after HD treatments and physical therapy during the day pt is typically worn out and does not eat or drink much of anything. Pt is drinking at least 1 Nepro per day, unsure about Engineer, water and Nutrition Related History: prior to admission, pt on Regular consistency diet with thin liquids; daughter reports pt does have difficulty swallowing at times but swallowing appears ok at present. Discussed with Tammy RN, pt took meds in applesauce this AM but RN has not seen pt take any liquids yet. RN to monitor for concerns for  aspiration  Skin:   (stage II pressure ulcer on sacrum)  Electrolyte and Renal Profile:  Recent Labs Lab 05/30/15 1250  06/01/15 0601 06/01/15 1554 06/02/15 0542 06/03/15 0714 06/05/15 0742  BUN  --   --   --  CREATININE  --   --   --  1.64* 1.17* 1.58* 1.32*  NA  --   --   --  136 139 138 136  K  --   < > 3.2* 4.1 3.5 3.8 3.4*  MG  --   --  1.9  --   --   --   --   PHOS 3.5  --   --  3.3  --   --   --   < > = values in this interval not displayed. Glucose Profile:  Recent Labs  06/03/15 0806 06/04/15 0731 06/05/15 0740  GLUCAP 110* 84 80   Meds: reviewed  Height:   Ht Readings from Last 1 Encounters:  05/29/15  (1.626 m)    Weight:   Wt Readings from Last 1 Encounters:  06/05/15 200 lb 6.4 oz (90.9 kg)    Filed Weights   06/03/15 0930 06/04/15 0434 06/05/15 0421  Weight: 209 lb 10.5 oz (95.1 kg) 200 lb 13.4 oz (91.1 kg) 200 lb 6.4 oz (90.9 kg)  ' BMI:  Body mass index is 34.38 kg/(m^2).  Estimated Nutritional Needs:   Kcal:  1255-1484 kcals using IBW 54.6 kg (AF 1.2, IF 1.1-1.3)   Protein:  65-83 g (1.2-1.5 g/kg)  Fluid:  1000 mL plus UOP  HIGH Care Level  Romelle Starcherate Jeneen Doutt MS, RD, LDN [phone] Pager

## 2015-06-05 NOTE — Progress Notes (Signed)
SUBJECTIVE: Pt resting comfortably. Less SOB   Filed Vitals:   06/05/15 0353 06/05/15 0421 06/05/15 0756 06/05/15 1139  BP: 110/26   148/70  Pulse: 86   69  Temp:    98 F (36.7 C)  TempSrc:      Resp: 20   18  Height:      Weight:  200 lb 6.4 oz (90.9 kg)    SpO2: 94%  90% 94%    Intake/Output Summary (Last 24 hours) at 06/05/15 1147 Last data filed at 06/05/15 0447  Gross per 24 hour  Intake      0 ml  Output   1500 ml  Net  -1500 ml    LABS: Basic Metabolic Panel:  Recent Labs  50/04/3809/31/16 0714 06/05/15 0742  NA 138 136  K 3.8 3.4*  CL 97* 98*  CO2 37* 32  GLUCOSE 114* 85  BUN 15 8  CREATININE 1.58* 1.32*  CALCIUM 9.6 9.2   Liver Function Tests: No results for input(s): AST, ALT, ALKPHOS, BILITOT, PROT, ALBUMIN in the last 72 hours. No results for input(s): LIPASE, AMYLASE in the last 72 hours. CBC:  Recent Labs  06/03/15 0714 06/05/15 0742  HGB 8.0* 7.7*   Cardiac Enzymes:  Recent Labs  06/03/15 0714  TROPONINI <0.03   BNP: Invalid input(s): POCBNP D-Dimer: No results for input(s): DDIMER in the last 72 hours. Hemoglobin A1C: No results for input(s): HGBA1C in the last 72 hours. Fasting Lipid Panel: No results for input(s): CHOL, HDL, LDLCALC, TRIG, CHOLHDL, LDLDIRECT in the last 72 hours. Thyroid Function Tests: No results for input(s): TSH, T4TOTAL, T3FREE, THYROIDAB in the last 72 hours.  Invalid input(s): FREET3 Anemia Panel: No results for input(s): VITAMINB12, FOLATE, FERRITIN, TIBC, IRON, RETICCTPCT in the last 72 hours.   PHYSICAL EXAM General: Well developed, well nourished, in no acute distress HEENT:  Normocephalic and atramatic Neck:  No JVD.  Lungs: Clear bilaterally to auscultation and percussion. Heart: HRRR . Normal S1 and S2 without gallops or murmurs.  Abdomen: Bowel sounds are positive, abdomen soft and non-tender  Msk:  Back normal, normal gait. Normal strength and tone for age. Extremities: No clubbing, cyanosis  or edema.   Neuro: Alert and oriented X 3. Psych:  Good affect, responds appropriately  TELEMETRY: NSR  ASSESSMENT AND PLAN: CHF/idioventricular rhythm, now in NSR and breathing improved significanty.   Active Problems:   Altered mental status    Ulysse Siemen A, MD, [facility] HospitalFACC 06/05/2015 11:47 AM

## 2015-06-05 NOTE — Progress Notes (Signed)
Patient slept most of the night, only waking once to request pain medicine. No other signs of discomfort or distress noted. Nursing staff will continue to monitor. Lamonte RicherKara A Orvel Cutsforth, RN

## 2015-06-05 NOTE — Care Management (Signed)
Spoke with attending regarding diagnosis/ etiology of chronic respiratory failure.  States it is due to COPD and he will document this in the medical record

## 2015-06-06 LAB — GLUCOSE, CAPILLARY: GLUCOSE-CAPILLARY: 88 mg/dL (ref 65–99)

## 2015-06-06 LAB — CBC WITH DIFFERENTIAL/PLATELET
BASOS ABS: 0.1 10*3/uL (ref 0–0.1)
Basophils Relative: 1 %
EOS ABS: 0.2 10*3/uL (ref 0–0.7)
EOS PCT: 3 %
HCT: 26.1 % — ABNORMAL LOW (ref 35.0–47.0)
HEMOGLOBIN: 8.3 g/dL — AB (ref 12.0–16.0)
LYMPHS ABS: 1.4 10*3/uL (ref 1.0–3.6)
LYMPHS PCT: 24 %
MCH: 31 pg (ref 26.0–34.0)
MCHC: 31.9 g/dL — ABNORMAL LOW (ref 32.0–36.0)
MCV: 97 fL (ref 80.0–100.0)
Monocytes Absolute: 0.9 10*3/uL (ref 0.2–0.9)
Monocytes Relative: 14 %
NEUTROS PCT: 58 %
Neutro Abs: 3.5 10*3/uL (ref 1.4–6.5)
PLATELETS: 266 10*3/uL (ref 150–440)
RBC: 2.69 MIL/uL — AB (ref 3.80–5.20)
RDW: 13.9 % (ref 11.5–14.5)
WBC: 6 10*3/uL (ref 3.6–11.0)

## 2015-06-06 MED ORDER — FENTANYL 12 MCG/HR TD PT72: 12.5000 ug | MEDICATED_PATCH | TRANSDERMAL | Status: DC

## 2015-06-06 MED ORDER — OXYCODONE HCL 5 MG PO TABS
5.0000 mg | ORAL_TABLET | Freq: Four times a day (QID) | ORAL | Status: DC | PRN
Start: 2015-06-06 — End: 2015-07-17

## 2015-06-06 MED ORDER — NEPRO/CARBSTEADY PO LIQD: 237.0000 mL | Freq: Two times a day (BID) | ORAL | Status: DC

## 2015-06-06 MED ORDER — HYDRALAZINE HCL 100 MG PO TABS: 100.0000 mg | ORAL_TABLET | Freq: Three times a day (TID) | ORAL | Status: DC

## 2015-06-06 NOTE — Progress Notes (Signed)
Patient has no acute event overnight. She remained NSR on the monitor with VS WDL for patient. She was A&O X4. Patient c/o pain was resolved with PRN pain meds. She rested for most of the night with grand daughter at the bedside. Patient was repositioned Q2 and provided assisted as needed.

## 2015-06-06 NOTE — Progress Notes (Signed)
Oxycodone 5mg  po given for complaints of back pain, will monitor.

## 2015-06-06 NOTE — Progress Notes (Signed)
Post hd tx 

## 2015-06-06 NOTE — Progress Notes (Signed)
Report called to Gertie FeyEra Dodd, [facility]

## 2015-06-06 NOTE — [place] FL2 (Signed)
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 Altered mental status 05/29/2015  . Pressure ulcer 05/15/2015  . Accelerated hypertension 05/08/2015  . Elevated troponin 05/08/2015  . GERD (gastroesophageal reflux disease) 05/08/2015  . Epilepsy (HCC) 05/08/2015  . Chronic CHF (congestive heart failure) (HCC) 05/08/2015  . UTI (urinary tract infection) 03/18/2015  . Acute sinusitis 03/18/2015  . Weakness of both lower limbs 03/15/2015  . GI bleed 12/27/2014  . Hypotension 12/27/2014  . Anemia of chronic disease 12/27/2014  . Acute posthemorrhagic anemia 12/27/2014  . CKD (chronic kidney disease), stage IV (HCC) 12/27/2014  . Cellulitis 12/27/2014    Orientation ACTIVITIES/SOCIAL BLADDER RESPIRATION    Self, Place    Incontinent O2 (As needed) (2 L)  BEHAVIORAL SYMPTOMS/MOOD NEUROLOGICAL BOWEL NUTRITION STATUS     (NA) Incontinent Diet (Dysphagia 1)  PHYSICIAN VISITS COMMUNICATION OF NEEDS Height & Weight Skin  30 days Verbally 5\' 4"  (162.6 cm) 201 lbs. Skin abrasions, PU Stage and Appropriate Care          AMBULATORY STATUS RESPIRATION    Assist extensive O2 (As needed) (2 L)      Personal Care Assistance Level of Assistance  Bathing, Feeding, Dressing Bathing Assistance: Maximum  assistance Feeding assistance: Limited assistance Dressing Assistance: Maximum assistance      Functional Limitations Info  Sight, Hearing, Speech Sight Info: Adequate Hearing Info: Impaired Speech Info: Adequate       SPECIAL CARE FACTORS FREQUENCY  PT (By licensed PT)     PT Frequency:  (3 to 5)             Additional Factors Info  Allergies, Isolation Precautions   Allergies Info:  (Enalapril, Tramadol, Capsaicin, Codeine, Hydrocodone, Oxycodone, Vicodin)     Isolation Precautions Info:  (05/08/15 mrsa pcr positive)     Current Medications (06/06/2015): Current Facility-Administered Medications  Medication Dose Route Frequency Provider Last Rate Last Dose  . 0.9 %  sodium chloride infusion  100 mL Intravenous PRN Munsoor Lateef, MD      . 0.9 %  sodium chloride infusion  100 mL Intravenous PRN Munsoor Lateef, MD      . acetaminophen (TYLENOL) tablet 650 mg  650 mg Oral Q6H PRN Delfino LovettVipul Shah, MD   650 mg at 05/30/15 1842  . alteplase (CATHFLO ACTIVASE) injection 2 mg  2 mg Intracatheter Once PRN Munsoor Lateef, MD      . alum & mag hydroxide-simeth (MAALOX/MYLANTA) 200-200-20 MG/5ML suspension 30 mL  30 mL Oral Q6H PRN [name] Konidena, MD      . amLODipine (NORVASC) tablet 10 mg  10 mg Oral QPC supper [name] Konidena, MD   10 mg at 06/05/15 1635  . aspirin EC tablet 81 mg  81 mg Oral Daily [name] Konidena, MD   81 mg  at 06/05/15 1001  . bisacodyl (DULCOLAX) EC tablet 5 mg  5 mg Oral Daily PRN Katha Hamming, MD      . bisacodyl (DULCOLAX) suppository 10 mg  10 mg Rectal Daily PRN Boom Highland, MD   10 mg at 06/03/15 1630  . carvedilol (COREG) tablet 12.5 mg  12.5 mg Oral Q12H Katha Hamming, MD   12.5 mg at 06/05/15 2226  . cloNIDine (CATAPRES) tablet 0.1 mg  0.1 mg Oral 3 times per day Houston Siren, MD   0.1 mg at 06/06/15 0512  . cyanocobalamin tablet 500 mcg  500 mcg Oral Daily Katha Hamming, MD   500 mcg at 06/05/15 1001  . docusate  sodium (COLACE) capsule 100 mg  100 mg Oral BID Katha Hamming, MD   100 mg at 06/05/15 2218  . epoetin alfa (EPOGEN,PROCRIT) injection 10,000 Units  10,000 Units Intravenous Q T,Th,Sa-HD Munsoor Lateef, MD   10,000 Units at 06/06/15 1222  . feeding supplement (NEPRO CARB STEADY) liquid 237 mL  237 mL Oral BID WC Houston Siren, MD   237 mL at 06/06/15 0800  . fentaNYL (DURAGESIC - dosed mcg/hr) 12.5 mcg  12.5 mcg Transdermal Q72H Caldeira Highland, MD   12.5 mcg at 06/04/15 1235  . fluticasone (FLONASE) 50 MCG/ACT nasal spray 2 spray  2 spray Each Nare Daily Katha Hamming, MD   2 spray at 06/06/15 0850  . furosemide (LASIX) tablet 80 mg  80 mg Oral Daily Brister Highland, MD   80 mg at 06/05/15 1001  . heparin injection 1,000 Units  1,000 Units Dialysis PRN Munsoor Lateef, MD   3,000 Units at 06/01/15 1827  . heparin injection 5,000 Units  5,000 Units Subcutaneous Q12H Cindi Carbon, RPH   5,000 Units at 06/05/15 2217  . hydrALAZINE (APRESOLINE) tablet 100 mg  100 mg Oral TID Houston Siren, MD   100 mg at 06/05/15 2217  . ipratropium-albuterol (DUONEB) 0.5-2.5 (3) MG/3ML nebulizer solution 3 mL  3 mL Nebulization QID Legere Highland, MD   3 mL at 06/06/15 0737  . ipratropium-albuterol (DUONEB) 0.5-2.5 (3) MG/3ML nebulizer solution 3 mL  3 mL Nebulization Q4H PRN Geary Highland, MD   3 mL at 06/05/15 2324  . iron polysaccharides (NIFEREX) capsule 150 mg  150 mg Oral Daily Katha Hamming, MD   150 mg at 06/05/15 1001  . latanoprost (XALATAN) 0.005 % ophthalmic solution 1 drop  1 drop Both Eyes QHS Katha Hamming, MD   1 drop at 06/05/15 2218  . lidocaine (LIDODERM) 5 % 1 patch  1 patch Transdermal Q24H Katha Hamming, MD   1 patch at 06/05/15 1635  . lidocaine (PF) (XYLOCAINE) 1 % injection 5 mL  5 mL Intradermal PRN Munsoor Lateef, MD      . lidocaine-prilocaine (EMLA) cream 1 application  1 application Topical PRN Munsoor Lateef, MD      . loratadine (CLARITIN) tablet 10  mg  10 mg Oral Daily Katha Hamming, MD   10 mg at 06/05/15 1000  . morphine 2 MG/ML injection 2 mg  2 mg Intravenous Q4H PRN Aten Highland, MD   2 mg at 06/06/15 0135  . multivitamin with minerals tablet 1 tablet  1 tablet Oral Daily Katha Hamming, MD   1 tablet at 06/05/15 1001  . multivitamin-lutein (OCUVITE-LUTEIN) capsule 1 capsule  1 capsule Oral BID Katha Hamming, MD   1 capsule at 06/05/15 2218  . ondansetron (ZOFRAN) tablet 4 mg  4 mg Oral Q6H PRN  Katha Hamming, MD       Or  . ondansetron [facility]) injection 4 mg  4 mg Intravenous Q6H PRN Katha Hamming, MD      . oxyCODONE (Oxy IR/ROXICODONE) immediate release tablet 5 mg  5 mg Oral Q6H PRN Schumm Highland, MD   5 mg at 06/06/15 0517  . pantoprazole (PROTONIX) EC tablet 40 mg  40 mg Oral BID Katha Hamming, MD   40 mg at 06/05/15 2218  . phenytoin (DILANTIN) ER capsule 300 mg  300 mg Oral QHS Katha Hamming, MD   300 mg at 06/05/15 2217  . predniSONE (DELTASONE) tablet 10 mg  10 mg Oral Q breakfast Foree Highland, MD   10 mg at 06/05/15 1001  . promethazine (PHENERGAN) tablet 25 mg  25 mg Oral Q4H PRN Katha Hamming, MD      . sodium chloride 0.9 % injection 3 mL  3 mL Intravenous PRN Houston Siren, MD   3 mL at 06/05/15 1257  . sodium chloride 0.9 % injection 3 mL  3 mL Intravenous Q12H Houston Siren, MD   3 mL at 06/05/15 2226   Do not use this list as official medication orders. Please verify with discharge summary.  Discharge Medications:   Medication List    STOP taking these medications        carbamide peroxide 6.5 % otic solution  Commonly known as:  DEBROX     fentaNYL 25 MCG/HR patch  Commonly known as:  DURAGESIC - dosed mcg/hr  Replaced by:  fentaNYL 12 MCG/HR     gabapentin 100 MG capsule  Commonly known as:  NEURONTIN     gentamicin cream 0.1 %  Commonly known as:  GARAMYCIN     metaxalone 800 MG tablet  Commonly known as:  SKELAXIN     multivitamin with  minerals Tabs tablet     ranitidine 150 MG tablet  Commonly known as:  ZANTAC     sodium bicarbonate 650 MG tablet      TAKE these medications        acetaminophen 325 MG tablet  Commonly known as:  TYLENOL  Take 650 mg by mouth every 4 (four) hours as needed for mild pain, fever or headache.     amLODipine 10 MG tablet  Commonly known as:  NORVASC  Take 1 tablet (10 mg total) by mouth daily after supper.     aspirin 81 MG EC tablet  Take 1 tablet (81 mg total) by mouth daily.     bimatoprost 0.03 % ophthalmic solution  Commonly known as:  LUMIGAN  Place 1 drop into both eyes 2 (two) times daily.     carvedilol 12.5 MG tablet  Commonly known as:  COREG  Take 12.5 mg by mouth every 12 (twelve) hours.     cloNIDine 0.1 MG tablet  Commonly known as:  CATAPRES  Take 0.1 mg by mouth every 8 (eight) hours.     CoQ10 50 MG Caps  Take 50 mg by mouth daily.     cyanocobalamin 500 MCG tablet  Take 500 mcg by mouth daily.     docusate sodium 100 MG capsule  Commonly known as:  COLACE  Take 100 mg by mouth every other day.     feeding supplement (NEPRO CARB STEADY) Liqd  Take 237 mLs by mouth 2 (two) times daily between meals.     fentaNYL 12 MCG/HR  Commonly known as:  DURAGESIC - dosed mcg/hr  Place 1  patch (12.5 mcg total) onto the skin every 3 (three) days.     fluticasone 50 MCG/ACT nasal spray  Commonly known as:  FLONASE  Place 2 sprays into both nostrils daily.     furosemide 40 MG tablet  Commonly known as:  LASIX  Take 40 mg by mouth daily.     hydrALAZINE 100 MG tablet  Commonly known as:  APRESOLINE  Take 1 tablet (100 mg total) by mouth 3 (three) times daily.     iron polysaccharides 150 MG capsule  Commonly known as:  NIFEREX  Take 1 capsule (150 mg total) by mouth daily.     lactulose 10 GM/15ML solution  Commonly known as:  CHRONULAC  Take 20 g by mouth daily as needed for mild constipation.     lidocaine 5 %  Commonly known as:  LIDODERM   Place 1 patch onto the skin daily. Remove & Discard patch within 12 hours or as directed by MD     loratadine 10 MG tablet  Commonly known as:  CLARITIN  Take 1 tablet (10 mg total) by mouth daily.     nitroGLYCERIN 0.4 MG SL tablet  Commonly known as:  NITROSTAT  Place 1 tablet (0.4 mg total) under the tongue every 5 (five) minutes as needed for chest pain.     oxyCODONE 5 MG immediate release tablet  Commonly known as:  Oxy IR/ROXICODONE  Take 1 tablet (5 mg total) by mouth every 6 (six) hours as needed for moderate pain or breakthrough pain.     pantoprazole 40 MG tablet  Commonly known as:  PROTONIX  Take 40 mg by mouth 2 (two) times daily.     phenytoin 100 MG ER capsule  Commonly known as:  DILANTIN  Take 300 mg by mouth at bedtime.     predniSONE 10 MG tablet  Commonly known as:  DELTASONE  Take 10 mg by mouth daily.     PRESERVISION AREDS PO  Take 1 capsule by mouth 2 (two) times daily.     promethazine 25 MG tablet  Commonly known as:  PHENERGAN  Take 25 mg by mouth every 4 (four) hours as needed for nausea or vomiting.        Relevant Imaging Results:  Relevant Lab Results:  Recent Labs    Additional Information    Soundra Pilon, LCSW

## 2015-06-06 NOTE — Care Management (Signed)
Attending relayed that patient is medically stable for discharge to skilled nursing today.  Notified CSW. Patient is going to require dialysis today before discharge.  Spoke with Kenney Housemananya in dialysis in regards to need of morning dialysis session so would not delay discharge.  She will schedule the session for this morning.  Notified Will with [facility] the attending has completed [facility] referral form.  CM instructed attending on the documentation requirements.

## 2015-06-06 NOTE — Progress Notes (Signed)
EMS here to transport pt to [facility] 

## 2015-06-06 NOTE — Progress Notes (Signed)
EMS called for non emergent transfer to [facility]

## 2015-06-06 NOTE — Progress Notes (Signed)
HRR/LUNGS DIMINISHED/CLEAR/GENERALIZED EDEMA/A&OX3/CATH PATENT/DENIES PAIN/COMPLAINTS/

## 2015-06-06 NOTE — Progress Notes (Signed)
Subjective:   Patient seen during dialysis Tolerating well  . Dialyzing in the chair today  Objective:  Vital signs in last 24 hours:  Temp:  [98 F (36.7 C)-98.3 F (36.8 C)] 98.1 F (36.7 C) (11/03 1354) Pulse Rate:  [58-90] 58 (11/03 1354) Resp:  [13-20] 16 (11/03 1354) BP: (134-176)/(37-85) 176/49 mmHg (11/03 1354) SpO2:  [92 %-100 %] 98 % (11/03 1354) Weight:  [87.9 kg (193 lb 12.6 oz)] 87.9 kg (193 lb 12.6 oz) (11/03 1354)  Weight change:  Filed Weights   06/04/15 0434 06/05/15 0421 06/06/15 1354  Weight: 91.1 kg (200 lb 13.4 oz) 90.9 kg (200 lb 6.4 oz) 87.9 kg (193 lb 12.6 oz)    Intake/Output:     Intake/Output this shift:  Total I/O In: 223 [P.O.:220; I.V.:3] Out: 1188 [Other:1188]  Physical Exam: General: NAD,  Frail elderly  Head:  Moist oral mucosal membranes  Eyes: Anicteric  Neck: Supple, trachea midline  Lungs:  Mild scattered rhonchi, normal effort  Heart: Irregular, no rubs  Abdomen:  Soft, nontender, pD catheter in place, exit site dressing clean, dry and intact  Extremities:  trace peripheral edema, left leg wound bandaged  Neurologic: Awake, will follow simple commands  Skin: No acute lesions  Access: Rt IJ PC,     Basic Metabolic Panel:  Recent Labs Lab 06/01/15 0601  06/01/15 1554 06/02/15 0542 06/03/15 0714 06/05/15 0742  NA  --   --  136 139 138 136  K 3.2*  --  4.1 3.5 3.8 3.4*  CL  --   --  95* 99* 97* 98*  CO2  --   --  35* 37* 37* 32  GLUCOSE  --   --  182* 92 114* 85  BUN  --   --  16 9 15 8   CREATININE  --   --  1.64* 1.17* 1.58* 1.32*  CALCIUM  --   < > 9.2 9.0 9.6 9.2  MG 1.9  --   --   --   --   --   PHOS  --   --  3.3  --   --   --   < > = values in this interval not displayed.  Liver Function Tests:  Recent Labs Lab 06/01/15 1554  ALBUMIN 2.5*   No results for input(s): LIPASE, AMYLASE in the last 168 hours. No results for input(s): AMMONIA in the last 168 hours.  CBC:  Recent Labs Lab 05/31/15 0334  06/01/15 0601 06/02/15 0542 06/03/15 0714 06/05/15 0742 06/06/15 0347  WBC 8.0 7.6  --   --   --  6.0  NEUTROABS  --   --   --   --   --  3.5  HGB 8.2* 8.7* 7.8* 8.0* 7.7* 8.3*  HCT 25.1* 26.2*  --   --   --  26.1*  MCV 95.3 95.9  --   --   --  97.0  PLT 352 367  --   --   --  266    Cardiac Enzymes:  Recent Labs Lab 06/01/15 0601 06/03/15 0714  CKTOTAL 9*  --   TROPONINI  --  <0.03    BNP: Invalid input(s): POCBNP  CBG:  Recent Labs Lab 06/02/15 0759 06/03/15 0806 06/04/15 0731 06/05/15 0740 06/06/15 0737  GLUCAP 95 110* 84 80 88    Microbiology: Results for orders placed or performed during the hospital encounter of 05/29/15  Urine culture     Status: None  Collection Time: 05/29/15 12:00 PM  Result Value Ref Range Status   Specimen Description URINE, CATHETERIZED  Final   Special Requests NONE  Final   Culture MULTIPLE SPECIES PRESENT, SUGGEST RECOLLECTION  Final   Report Status 05/31/2015 FINAL  Final  Urine culture     Status: None   Collection Time: 06/01/15 10:00 AM  Result Value Ref Range Status   Specimen Description URINE, CATHETERIZED  Final   Special Requests Normal  Final   Culture NO GROWTH 2 DAYS  Final   Report Status 06/03/2015 FINAL  Final    Coagulation Studies: No results for input(s): LABPROT, INR in the last 72 hours.  Urinalysis: No results for input(s): COLORURINE, LABSPEC, PHURINE, GLUCOSEU, HGBUR, BILIRUBINUR, KETONESUR, PROTEINUR, UROBILINOGEN, NITRITE, LEUKOCYTESUR in the last 72 hours.  Invalid input(s): APPERANCEUR    Imaging: No results found.   Medications:     . amLODipine  10 mg Oral QPC supper  . aspirin EC  81 mg Oral Daily  . carvedilol  12.5 mg Oral Q12H  . cloNIDine  0.1 mg Oral 3 times per day  . cyanocobalamin  500 mcg Oral Daily  . docusate sodium  100 mg Oral BID  . epoetin (EPOGEN/PROCRIT) injection  10,000 Units Intravenous Q T,Th,Sa-HD  . feeding supplement (NEPRO CARB STEADY)  237 mL Oral  BID WC  . fentaNYL  12.5 mcg Transdermal Q72H  . fluticasone  2 spray Each Nare Daily  . furosemide  80 mg Oral Daily  . heparin  5,000 Units Subcutaneous Q12H  . hydrALAZINE  100 mg Oral TID  . ipratropium-albuterol  3 mL Nebulization QID  . iron polysaccharides  150 mg Oral Daily  . latanoprost  1 drop Both Eyes QHS  . lidocaine  1 patch Transdermal Q24H  . loratadine  10 mg Oral Daily  . multivitamin with minerals  1 tablet Oral Daily  . multivitamin-lutein  1 capsule Oral BID  . pantoprazole  40 mg Oral BID  . phenytoin  300 mg Oral QHS  . predniSONE  10 mg Oral Q breakfast  . sodium chloride  3 mL Intravenous Q12H   sodium chloride, sodium chloride, acetaminophen, alteplase, alum & mag hydroxide-simeth, bisacodyl, bisacodyl, heparin, ipratropium-albuterol, lidocaine (PF), lidocaine-prilocaine, morphine injection, ondansetron **OR** ondansetron (ZOFRAN) IV, oxyCODONE, promethazine, sodium chloride  Assessment/ Plan:  Ms. SHANORA CHRISTENSEN is a 81 y.o. white female with past medical history of hypertension, obstructive sleep apnea, DJD, CVA with residual left sided weakness, carotid stenosis status post L CEA 2/15, seizure disorder, diastolic congestive heart failure, anemia  [facility] TTS [facility]  1. End Stage Renal Disease: tolerated dialysis well yesterday. Continue TTS schedule.  Ultimate plan is for patient to transition to PD however patient is currently at a SNF and appears quite debilitated.  -  Patient seen during dialysis Tolerating well   2. Hypertension: at goal. Allergy to enalapril (causes anaphylaxis). -  Continue current regimen: furosemide, hydralazine, clonidine, carvedilol and amlodipine.   3. Secondary hyperparathyroidism. PTH 100, phos 3.3,  - no indication for binders or vitamin D agent.   4. Anemia chronic kidney disease: hemoglobin 8.3 - epogen 10000 units IV with HD. - iron supplements  5.  Urinary Tract infection with encephalopathy:  Improved  mental status now  6. Hypoalbuminemia: concerning. Supplement with Nepro.   LOS: 8  Toluwanimi Radebaugh 11/3/20163:20 PM

## 2015-06-06 NOTE — Progress Notes (Signed)
Pt returned from Dialysis, dressing to rt chest HD catheter dry and intact.  Denies need at this time.  CB in reach, SR up x2.

## 2015-06-06 NOTE — Progress Notes (Signed)
Clinical Social Worker informed by Cefalu Highlandichard Wieting, MD that patient is medically ready to discharge back to SNF, Patient and patient's daughter Tresa EndoKelly are in a agreement with plan.  Call to [facility] to confirm that patient's bed is ready. Provided patient's room number 501 and number to call for report [phone] .  FL2 and discharge summary sent to facility. Rx's in discharge packet.   RN will call report and patient will discharge to [facility] via EMS.  Sammuel Hineseborah Moore. Theresia MajorsLCSWA, MSW Clinical Social Work Department [phone] 2:02 PM

## 2015-06-06 NOTE — Discharge Summary (Signed)
[facility] [place] [facility]   PATIENT NAME: Kristina GoltzJacquelyn Wilson    MR#:  [id number]  DATE OF BIRTH:  [birth year]  DATE OF ADMISSION:  05/29/2015 ADMITTING PHYSICIAN: [name] Konidena, MD  DATE OF DISCHARGE: 06/05/2105  PRIMARY CARE PHYSICIAN: Jaclyn ShaggyATE,DENNY C, MD    ADMISSION DIAGNOSIS:  Hypercarbia [R06.89] Acute on chronic renal insufficiency (HCC) [N28.9, N18.9] NSTEMI (non-ST elevated myocardial infarction) (HCC) [I21.4] Acute on chronic congestive heart failure, unspecified congestive heart failure type (HCC) [I50.9]  DISCHARGE DIAGNOSIS:  Active Problems:   Altered mental status   SECONDARY DIAGNOSIS:   Past Medical History  Diagnosis Date  . Muscle weakness   . Chronic respiratory failure (HCC)   . Pneumonia   . Sleep apnea   . CHF (congestive heart failure) (HCC)   . CKD (chronic kidney disease), stage IV (HCC)   . Dysphagia   . GERD (gastroesophageal reflux disease)   . Back pain   . Epilepsy (HCC)   . Hypertension   . Stroke (HCC)   . Headache   . Arthritis   . Anemia     iron deficiency anemia  . Complication of anesthesia     difficult waking up after surgery (2013)    HOSPITAL COURSE:   1. Acute on chronic respiratory failure with hypoxia and hypercapnia. Patient uses CPAP at night. We tried BiPAP in order to blow off CO2. She was unable to tolerate the BiPAP. I think she will be a good candidate for the Trilogy machine to blow off CO2. The severity of her disease including CO2 retention needs us to blow off CO2 at night. Due to the increased probability of acute exacerbations the patient requires ventilation during the day as needed and addition to nocturnal usage. Patient has numerous admissions secondary to respiratory issues. At rehabilitation, CPAP will be continued. Upon discharge from rehabilitation oxygen company [employer] will set up the Trilogy machine. Patient's respiratory status has improved. 2. COPD  exacerbation- I did switch to IV Solu-Medrol and nebulizer treatments. Upon discharge patient is moving better air. Patient takes chronic prednisone 10 mg daily. 3. Acute diastolic congestive heart failure and fluid overload- patient needed to Dialysis in order to remove fluid in the right direction. Lasix will be given on a daily basis. Continue dialysis on Tuesday Thursday Saturday schedule. 4. End-stage renal disease on dialysis 5. Acute encephalopathy. This was initially secondary to acute cystitis without hematuria and the patient was given IV Rocephin. Unfortunately nothing grew out of the urine culture. Antibiotics were stopped. This could also be due to CO2 retention and or polypharmacy. Gabapentin and muscle relaxant were stopped. I was able to reintroduce low-dose fentanyl patch and oxycodone when necessary. 6. Gastroesophageal reflux disease without esophagitis continue PPI 7. Chronic back pain and weakness- PT recommended rehabilitation. Patient is on chronic pain medication and chronic prednisone 8. Anemia of chronic disease patient is on Procrit with dialysis. Patient is also on iron. Hemoglobin upon discharge is 8.3.  DISCHARGE CONDITIONS:   Fair  CONSULTS OBTAINED:  Treatment Team:  Mady HaagensenMunsoor Lateef, MD Laurier NancyShaukat A Khan, MD  DRUG ALLERGIES:   Allergies  Allergen Reactions  . Enalapril Anaphylaxis  . Tramadol Shortness Of Breath  . Capsaicin Other (See Comments)    Reaction:  Unknown   . Codeine Other (See Comments)    Reaction:  Unknown   . Hydrocodone Other (See Comments)    Reaction:  Unknown   . Oxycodone Other (See Comments)    Reaction:  Unknown   . Vicodin [Hydrocodone-Acetaminophen] Other (See Comments)    Reaction:  Unknown     DISCHARGE MEDICATIONS:   Current Discharge Medication List    START taking these medications   Details  fentaNYL (DURAGESIC - DOSED MCG/HR) 12 MCG/HR Place 1 patch (12.5 mcg total) onto the skin every 3 (three) days. Qty: 5 patch,  Refills: 0    Nutritional Supplements (FEEDING SUPPLEMENT, NEPRO CARB STEADY,) LIQD Take 237 mLs by mouth 2 (two) times daily between meals. Qty: 60 Can, Refills: 0      CONTINUE these medications which have CHANGED   Details  hydrALAZINE (APRESOLINE) 100 MG tablet Take 1 tablet (100 mg total) by mouth 3 (three) times daily. Qty: 90 tablet, Refills: 0    oxyCODONE (OXY IR/ROXICODONE) 5 MG immediate release tablet Take 1 tablet (5 mg total) by mouth every 6 (six) hours as needed for moderate pain or breakthrough pain. Qty: 50 tablet, Refills: 0      CONTINUE these medications which have NOT CHANGED   Details  acetaminophen (TYLENOL) 325 MG tablet Take 650 mg by mouth every 4 (four) hours as needed for mild pain, fever or headache.     amLODipine (NORVASC) 10 MG tablet Take 1 tablet (10 mg total) by mouth daily after supper. Qty: 30 tablet, Refills: 0    aspirin EC 81 MG EC tablet Take 1 tablet (81 mg total) by mouth daily. Qty: 30 tablet, Refills: 0    bimatoprost (LUMIGAN) 0.03 % ophthalmic solution Place 1 drop into both eyes 2 (two) times daily.    carvedilol (COREG) 12.5 MG tablet Take 12.5 mg by mouth every 12 (twelve) hours.    cloNIDine (CATAPRES) 0.1 MG tablet Take 0.1 mg by mouth every 8 (eight) hours.    Coenzyme Q10 (COQ10) 50 MG CAPS Take 50 mg by mouth daily.    cyanocobalamin 500 MCG tablet Take 500 mcg by mouth daily.    docusate sodium (COLACE) 100 MG capsule Take 100 mg by mouth every other day.    fluticasone (FLONASE) 50 MCG/ACT nasal spray Place 2 sprays into both nostrils daily. Refills: 2    furosemide (LASIX) 40 MG tablet Take 40 mg by mouth daily.    iron polysaccharides (NIFEREX) 150 MG capsule Take 1 capsule (150 mg total) by mouth daily. Qty: 30 capsule, Refills: 0    lactulose (CHRONULAC) 10 GM/15ML solution Take 20 g by mouth daily as needed for mild constipation.    lidocaine (LIDODERM) 5 % Place 1 patch onto the skin daily. Remove & Discard  patch within 12 hours or as directed by MD    loratadine (CLARITIN) 10 MG tablet Take 1 tablet (10 mg total) by mouth daily.    Multiple Vitamins-Minerals (PRESERVISION AREDS PO) Take 1 capsule by mouth 2 (two) times daily.    nitroGLYCERIN (NITROSTAT) 0.4 MG SL tablet Place 1 tablet (0.4 mg total) under the tongue every 5 (five) minutes as needed for chest pain. Qty: 20 tablet, Refills: 0    pantoprazole (PROTONIX) 40 MG tablet Take 40 mg by mouth 2 (two) times daily.    phenytoin (DILANTIN) 100 MG ER capsule Take 300 mg by mouth at bedtime.     predniSONE (DELTASONE) 10 MG tablet Take 10 mg by mouth daily.    promethazine (PHENERGAN) 25 MG tablet Take 25 mg by mouth every 4 (four) hours as needed for nausea or vomiting.      STOP taking these medications     carbamide  peroxide (DEBROX) 6.5 % otic solution      fentaNYL (DURAGESIC - DOSED MCG/HR) 25 MCG/HR patch      gabapentin (NEURONTIN) 100 MG capsule      gentamicin cream (GARAMYCIN) 0.1 %      metaxalone (SKELAXIN) 800 MG tablet      ranitidine (ZANTAC) 150 MG tablet      sodium bicarbonate 650 MG tablet      cloNIDine (CATAPRES) 0.3 MG tablet          DISCHARGE INSTRUCTIONS:    Follow-up with Dr. rehabilitation 1 day follow-up with dialysis Tuesday Thursday and Saturday.  If you experience worsening of your admission symptoms, develop shortness of breath, life threatening emergency, suicidal or homicidal thoughts you must seek medical attention immediately by calling 911 or calling your MD immediately  if symptoms less severe.  You Must read complete instructions/literature along with all the possible adverse reactions/side effects for all the Medicines you take and that have been prescribed to you. Take any new Medicines after you have completely understood and accept all the possible adverse reactions/side effects.   Please note  You were cared for by a hospitalist during your hospital stay. If you have any  questions about your discharge medications or the care you received while you were in the hospital after you are discharged, you can call the unit and asked to speak with the hospitalist on call if the hospitalist that took care of you is not available. Once you are discharged, your primary care physician will handle any further medical issues. Please note that NO REFILLS for any discharge medications will be authorized once you are discharged, as it is imperative that you return to your primary care physician (or establish a relationship with a primary care physician if you do not have one) for your aftercare needs so that they can reassess your need for medications and monitor your lab values.    Today   CHIEF COMPLAINT:   Chief Complaint  Patient presents with  . Altered Mental Status    HISTORY OF PRESENT ILLNESS:  Kristina Wilson  is a 81 y.o. female presented with altered mental status.   VITAL SIGNS:  Blood pressure 155/59, pulse 68, temperature 98.2 F (36.8 C), temperature source Oral, resp. rate 13, height  (1.626 m), weight 90.9 kg (200 lb 6.4 oz), SpO2 100 %.  I/O:   Intake/Output Summary (Last 24 hours) at 06/06/15 1020 Last data filed at 06/06/15 0830  Gross per 24 hour  Intake      0 ml  Output      0 ml  Net      0 ml    PHYSICAL EXAMINATION:  GENERAL:  81 y.o.-year-old patient lying in the bed with no acute distress.  EYES: Pupils equal, round, reactive to light and accommodation. No scleral icterus. Extraocular muscles intact.  HEENT: Head atraumatic, normocephalic. Oropharynx and nasopharynx clear.  NECK:  Supple, no jugular venous distention. No thyroid enlargement, no tenderness.  LUNGS: decreased  breath sounds bilaterally, no wheezing, rales,rhonchi or crepitation. No use of accessory muscles of respiration.  CARDIOVASCULAR: S1, S2 normal. 2/6 systolic murmurs, no  rubs, or gallops.  ABDOMEN: Soft, non-tender, non-distended. Bowel sounds present. No  organomegaly or mass.  EXTREMITIES: No pedal edema, cyanosis, or clubbing.  NEUROLOGIC: Cranial nerves II through XII are intact. Sensation intact. Gait not checked.  PSYCHIATRIC: The patient is alert and oriented x 3.  SKIN: No obvious rash, lesion, or ulcer.  DATA REVIEW:   CBC  Recent Labs Lab 06/06/15 0347  WBC 6.0  HGB 8.3*  HCT 26.1*  PLT 266    Chemistries   Recent Labs Lab 06/01/15 0601  06/05/15 0742  NA  --   < > 136  K 3.2*  < > 3.4*  CL  --   < > 98*  CO2  --   < > 32  GLUCOSE  --   < > 85  BUN  --   < > 8  CREATININE  --   < > 1.32*  CALCIUM  --   < > 9.2  MG 1.9  --   --   < > = values in this interval not displayed.  Cardiac Enzymes  Recent Labs Lab 06/03/15 0714  TROPONINI <0.03    Microbiology Results  Results for orders placed or performed during the hospital encounter of 05/29/15  Urine culture     Status: None   Collection Time: 05/29/15 12:00 PM  Result Value Ref Range Status   Specimen Description URINE, CATHETERIZED  Final   Special Requests NONE  Final   Culture MULTIPLE SPECIES PRESENT, SUGGEST RECOLLECTION  Final   Report Status 05/31/2015 FINAL  Final  Urine culture     Status: None   Collection Time: 06/01/15 10:00 AM  Result Value Ref Range Status   Specimen Description URINE, CATHETERIZED  Final   Special Requests Normal  Final   Culture NO GROWTH 2 DAYS  Final   Report Status 06/03/2015 FINAL  Final    Management plans discussed with the patient, family and they are in agreement.  CODE STATUS:     Code Status Orders        Start     Ordered   05/29/15 1601  Full code   Continuous     05/29/15 1605    Advance Directive Documentation        Most Recent Value   Type of Advance Directive  Healthcare Power of [facility], daughter]   Pre-existing out of facility DNR order (yellow form or pink MOST form)     "MOST" Form in Place?        TOTAL TIME TAKING CARE OF THIS PATIENT: 35 minutes.     Bertran Highland M.D on 06/06/2015 at 10:20 AM  Between 7am to 6pm - Pager - [phone]  After 6pm go to [email or web address] - password [email or web address] [facility]  [facility]  Office  [phone]  CC: Primary care physician; Jaclyn Shaggy, MD

## 2015-06-06 NOTE — Progress Notes (Signed)
SUBJECTIVE: feeling better   Filed Vitals:   06/05/15 2226 06/05/15 2324 06/06/15 0509 06/06/15 0828  BP:   157/37 154/75  Pulse: 88  79 79  Temp:   98 F (36.7 C) 98.2 F (36.8 C)  TempSrc:   Oral Oral  Resp:   18 17  Height:      Weight:      SpO2:  92% 99% 100%    Intake/Output Summary (Last 24 hours) at 06/06/15 0849 Last data filed at 06/06/15 0700  Gross per 24 hour  Intake      0 ml  Output      0 ml  Net      0 ml    LABS: Basic Metabolic Panel:  Recent Labs  16/05/9610/02/16 0742  NA 136  K 3.4*  CL 98*  CO2 32  GLUCOSE 85  BUN 8  CREATININE 1.32*  CALCIUM 9.2   Liver Function Tests: No results for input(s): AST, ALT, ALKPHOS, BILITOT, PROT, ALBUMIN in the last 72 hours. No results for input(s): LIPASE, AMYLASE in the last 72 hours. CBC:  Recent Labs  06/05/15 0742 06/06/15 0347  WBC  --  6.0  NEUTROABS  --  3.5  HGB 7.7* 8.3*  HCT  --  26.1*  MCV  --  97.0  PLT  --  266   Cardiac Enzymes: No results for input(s): CKTOTAL, CKMB, CKMBINDEX, TROPONINI in the last 72 hours. BNP: Invalid input(s): POCBNP D-Dimer: No results for input(s): DDIMER in the last 72 hours. Hemoglobin A1C: No results for input(s): HGBA1C in the last 72 hours. Fasting Lipid Panel: No results for input(s): CHOL, HDL, LDLCALC, TRIG, CHOLHDL, LDLDIRECT in the last 72 hours. Thyroid Function Tests: No results for input(s): TSH, T4TOTAL, T3FREE, THYROIDAB in the last 72 hours.  Invalid input(s): FREET3 Anemia Panel: No results for input(s): VITAMINB12, FOLATE, FERRITIN, TIBC, IRON, RETICCTPCT in the last 72 hours.   PHYSICAL EXAM General: Well developed, well nourished, in no acute distress HEENT:  Normocephalic and atramatic Neck:  No JVD.  Lungs: Clear bilaterally to auscultation and percussion. Heart: HRRR . Normal S1 and S2 without gallops or murmurs.  Abdomen: Bowel sounds are positive, abdomen soft and non-tender  Msk:  Back normal, normal gait. Normal strength  and tone for age. Extremities: No clubbing, cyanosis or edema.   Neuro: Alert and oriented X 3. Psych:  Good affect, responds appropriately  TELEMETRY:NSR  ASSESSMENT AND PLAN: CHF with fluid overload due to renal failure. Doing much better.  Active Problems:   Altered mental status    KHAN,SHAUKAT A, MD, [facility] 06/06/2015 8:49 AM

## 2015-06-10 NOTE — Progress Notes (Signed)
   06/03/15 1425  BiPAP/CPAP/SIPAP  BiPAP/CPAP/SIPAP Pt Type Adult  Mask Type Full face mask  Mask Size Medium  Set Rate 8 breaths/min  Respiratory Rate 14 breaths/min  IPAP 12 cmH20  EPAP 5 cmH2O  Oxygen Percent 30 %  Minute Ventilation 6.7  Leak 35  Peak Inspiratory Pressure (PIP) 12  Tidal Volume (Vt) 500  BiPAP/CPAP/SIPAP BiPAP  Patient Home Equipment No  Auto Titrate (patient pulled off the mask in 5 min. placed on auto cpap )

## 2015-07-17 ENCOUNTER — Emergency Department: Payer: Medicare Other

## 2015-07-17 ENCOUNTER — Inpatient Hospital Stay
Admission: EM | Admit: 2015-07-17 | Discharge: 2015-07-31 | DRG: 177 | Disposition: A | Discharge status: Hospice | Payer: Medicare Other | Attending: Internal Medicine | Admitting: Internal Medicine

## 2015-07-17 ENCOUNTER — Encounter: Payer: Self-pay | Admitting: *Deleted

## 2015-07-17 DIAGNOSIS — I69354 Hemiplegia and hemiparesis following cerebral infarction affecting left non-dominant side: Secondary | ICD-10-CM | POA: Diagnosis not present

## 2015-07-17 DIAGNOSIS — J159 Unspecified bacterial pneumonia: Secondary | ICD-10-CM | POA: Diagnosis present

## 2015-07-17 DIAGNOSIS — Z7952 Long term (current) use of systemic steroids: Secondary | ICD-10-CM | POA: Diagnosis not present

## 2015-07-17 DIAGNOSIS — G4733 Obstructive sleep apnea (adult) (pediatric): Secondary | ICD-10-CM | POA: Diagnosis present

## 2015-07-17 DIAGNOSIS — J9690 Respiratory failure, unspecified, unspecified whether with hypoxia or hypercapnia: Secondary | ICD-10-CM

## 2015-07-17 DIAGNOSIS — J9621 Acute and chronic respiratory failure with hypoxia: Secondary | ICD-10-CM | POA: Diagnosis present

## 2015-07-17 DIAGNOSIS — J9811 Atelectasis: Secondary | ICD-10-CM | POA: Diagnosis not present

## 2015-07-17 DIAGNOSIS — N2581 Secondary hyperparathyroidism of renal origin: Secondary | ICD-10-CM | POA: Diagnosis present

## 2015-07-17 DIAGNOSIS — R0602 Shortness of breath: Secondary | ICD-10-CM | POA: Diagnosis present

## 2015-07-17 DIAGNOSIS — J440 Chronic obstructive pulmonary disease with acute lower respiratory infection: Secondary | ICD-10-CM | POA: Diagnosis present

## 2015-07-17 DIAGNOSIS — N184 Chronic kidney disease, stage 4 (severe): Secondary | ICD-10-CM | POA: Diagnosis not present

## 2015-07-17 DIAGNOSIS — G9349 Other encephalopathy: Secondary | ICD-10-CM | POA: Diagnosis not present

## 2015-07-17 DIAGNOSIS — L8990 Pressure ulcer of unspecified site, unspecified stage: Secondary | ICD-10-CM | POA: Diagnosis present

## 2015-07-17 DIAGNOSIS — E162 Hypoglycemia, unspecified: Secondary | ICD-10-CM | POA: Diagnosis present

## 2015-07-17 DIAGNOSIS — Z952 Presence of prosthetic heart valve: Secondary | ICD-10-CM

## 2015-07-17 DIAGNOSIS — D631 Anemia in chronic kidney disease: Secondary | ICD-10-CM | POA: Diagnosis present

## 2015-07-17 DIAGNOSIS — J189 Pneumonia, unspecified organism: Secondary | ICD-10-CM | POA: Insufficient documentation

## 2015-07-17 DIAGNOSIS — Z515 Encounter for palliative care: Secondary | ICD-10-CM | POA: Diagnosis not present

## 2015-07-17 DIAGNOSIS — I5032 Chronic diastolic (congestive) heart failure: Secondary | ICD-10-CM | POA: Diagnosis present

## 2015-07-17 DIAGNOSIS — Z992 Dependence on renal dialysis: Secondary | ICD-10-CM

## 2015-07-17 DIAGNOSIS — I132 Hypertensive heart and chronic kidney disease with heart failure and with stage 5 chronic kidney disease, or end stage renal disease: Secondary | ICD-10-CM | POA: Diagnosis present

## 2015-07-17 DIAGNOSIS — Z87891 Personal history of nicotine dependence: Secondary | ICD-10-CM | POA: Diagnosis not present

## 2015-07-17 DIAGNOSIS — G40909 Epilepsy, unspecified, not intractable, without status epilepticus: Secondary | ICD-10-CM | POA: Diagnosis present

## 2015-07-17 DIAGNOSIS — R1310 Dysphagia, unspecified: Secondary | ICD-10-CM | POA: Diagnosis present

## 2015-07-17 DIAGNOSIS — Z66 Do not resuscitate: Secondary | ICD-10-CM | POA: Diagnosis present

## 2015-07-17 DIAGNOSIS — H9190 Unspecified hearing loss, unspecified ear: Secondary | ICD-10-CM | POA: Diagnosis present

## 2015-07-17 DIAGNOSIS — I10 Essential (primary) hypertension: Secondary | ICD-10-CM

## 2015-07-17 DIAGNOSIS — E876 Hypokalemia: Secondary | ICD-10-CM | POA: Diagnosis not present

## 2015-07-17 DIAGNOSIS — J690 Pneumonitis due to inhalation of food and vomit: Secondary | ICD-10-CM | POA: Diagnosis present

## 2015-07-17 DIAGNOSIS — K219 Gastro-esophageal reflux disease without esophagitis: Secondary | ICD-10-CM | POA: Diagnosis present

## 2015-07-17 DIAGNOSIS — R4189 Other symptoms and signs involving cognitive functions and awareness: Secondary | ICD-10-CM

## 2015-07-17 DIAGNOSIS — Z9981 Dependence on supplemental oxygen: Secondary | ICD-10-CM | POA: Diagnosis not present

## 2015-07-17 DIAGNOSIS — J9601 Acute respiratory failure with hypoxia: Secondary | ICD-10-CM | POA: Diagnosis not present

## 2015-07-17 DIAGNOSIS — N186 End stage renal disease: Secondary | ICD-10-CM | POA: Diagnosis present

## 2015-07-17 DIAGNOSIS — J9610 Chronic respiratory failure, unspecified whether with hypoxia or hypercapnia: Secondary | ICD-10-CM | POA: Diagnosis present

## 2015-07-17 DIAGNOSIS — Z7401 Bed confinement status: Secondary | ICD-10-CM | POA: Diagnosis not present

## 2015-07-17 DIAGNOSIS — J9611 Chronic respiratory failure with hypoxia: Secondary | ICD-10-CM | POA: Diagnosis not present

## 2015-07-17 DIAGNOSIS — J9622 Acute and chronic respiratory failure with hypercapnia: Secondary | ICD-10-CM | POA: Diagnosis present

## 2015-07-17 DIAGNOSIS — Z888 Allergy status to other drugs, medicaments and biological substances status: Secondary | ICD-10-CM

## 2015-07-17 DIAGNOSIS — L89153 Pressure ulcer of sacral region, stage 3: Secondary | ICD-10-CM | POA: Diagnosis present

## 2015-07-17 DIAGNOSIS — I493 Ventricular premature depolarization: Secondary | ICD-10-CM | POA: Diagnosis not present

## 2015-07-17 DIAGNOSIS — Z7982 Long term (current) use of aspirin: Secondary | ICD-10-CM

## 2015-07-17 DIAGNOSIS — I509 Heart failure, unspecified: Secondary | ICD-10-CM

## 2015-07-17 DIAGNOSIS — Z886 Allergy status to analgesic agent status: Secondary | ICD-10-CM | POA: Diagnosis not present

## 2015-07-17 LAB — CBC
HCT: 38.5 % (ref 35.0–47.0)
Hemoglobin: 12.2 g/dL (ref 12.0–16.0)
MCH: 30.5 pg (ref 26.0–34.0)
MCHC: 31.9 g/dL — ABNORMAL LOW (ref 32.0–36.0)
MCV: 95.9 fL (ref 80.0–100.0)
PLATELETS: 252 10*3/uL (ref 150–440)
RBC: 4.01 MIL/uL (ref 3.80–5.20)
RDW: 18.6 % — AB (ref 11.5–14.5)
WBC: 7.1 10*3/uL (ref 3.6–11.0)

## 2015-07-17 LAB — BASIC METABOLIC PANEL
Anion gap: 9 (ref 5–15)
BUN: 23 mg/dL — AB (ref 6–20)
CALCIUM: 9.5 mg/dL (ref 8.9–10.3)
CO2: 27 mmol/L (ref 22–32)
CREATININE: 2.38 mg/dL — AB (ref 0.44–1.00)
Chloride: 92 mmol/L — ABNORMAL LOW (ref 101–111)
GFR calc Af Amer: 20 mL/min — ABNORMAL LOW (ref >60)
GFR, EST NON AFRICAN AMERICAN: 17 mL/min — AB (ref >60)
Glucose, Bld: 134 mg/dL — ABNORMAL HIGH (ref 65–99)
POTASSIUM: 3 mmol/L — AB (ref 3.5–5.1)
SODIUM: 128 mmol/L — AB (ref 135–145)

## 2015-07-17 LAB — TROPONIN I

## 2015-07-17 LAB — EXPECTORATED SPUTUM ASSESSMENT W REFEX TO RESP CULTURE

## 2015-07-17 LAB — MRSA PCR SCREENING: MRSA BY PCR: POSITIVE — AB

## 2015-07-17 LAB — EXPECTORATED SPUTUM ASSESSMENT W GRAM STAIN, RFLX TO RESP C

## 2015-07-17 MED ORDER — VANCOMYCIN HCL IN DEXTROSE 1-5 GM/200ML-% IV SOLN
1000.0000 mg | Freq: Once | INTRAVENOUS | Status: AC
  Administered 2015-07-17: 1000 mg via INTRAVENOUS
  Filled 2015-07-17: qty 200

## 2015-07-17 MED ORDER — PHENYTOIN SODIUM EXTENDED 100 MG PO CAPS
300.0000 mg | ORAL_CAPSULE | Freq: Every day | ORAL | Status: DC
  Filled 2015-07-17: qty 3

## 2015-07-17 MED ORDER — ACETAMINOPHEN 325 MG PO TABS
650.0000 mg | ORAL_TABLET | Freq: Three times a day (TID) | ORAL | Status: DC | PRN
  Administered 2015-07-29: 650 mg via ORAL
  Filled 2015-07-17 (×2): qty 2

## 2015-07-17 MED ORDER — OCUVITE-LUTEIN PO CAPS
1.0000 | ORAL_CAPSULE | Freq: Two times a day (BID) | ORAL | Status: DC
  Administered 2015-07-22 – 2015-07-29 (×13): 1 via ORAL
  Filled 2015-07-17 (×16): qty 1

## 2015-07-17 MED ORDER — FENTANYL 12 MCG/HR TD PT72
25.0000 ug | MEDICATED_PATCH | TRANSDERMAL | Status: DC
  Administered 2015-07-20 – 2015-07-29 (×4): 25 ug via TRANSDERMAL
  Filled 2015-07-17: qty 1
  Filled 2015-07-17 (×3): qty 2

## 2015-07-17 MED ORDER — PIPERACILLIN-TAZOBACTAM 3.375 G IVPB
3.3750 g | Freq: Once | INTRAVENOUS | Status: AC
  Administered 2015-07-17: 3.375 g via INTRAVENOUS
  Filled 2015-07-17: qty 50

## 2015-07-17 MED ORDER — MUPIROCIN 2 % EX OINT
1.0000 | TOPICAL_OINTMENT | Freq: Two times a day (BID) | CUTANEOUS | Status: AC
Start: 2015-07-17 — End: 2015-07-22
  Administered 2015-07-17 – 2015-07-22 (×8): 1 via NASAL
  Filled 2015-07-17 (×2): qty 22

## 2015-07-17 MED ORDER — CHLORHEXIDINE GLUCONATE CLOTH 2 % EX PADS
6.0000 | MEDICATED_PAD | Freq: Every day | CUTANEOUS | Status: AC
  Administered 2015-07-18 – 2015-07-22 (×5): 6 via TOPICAL

## 2015-07-17 MED ORDER — GENTAMICIN SULFATE 0.1 % EX CREA
1.0000 "application " | TOPICAL_CREAM | Freq: Every day | CUTANEOUS | Status: DC
  Administered 2015-07-18 – 2015-07-29 (×9): 1 via TOPICAL
  Filled 2015-07-17 (×4): qty 15

## 2015-07-17 MED ORDER — IPRATROPIUM-ALBUTEROL 0.5-2.5 (3) MG/3ML IN SOLN
3.0000 mL | Freq: Once | RESPIRATORY_TRACT | Status: AC
  Administered 2015-07-17: 3 mL via RESPIRATORY_TRACT
  Filled 2015-07-17: qty 3

## 2015-07-17 MED ORDER — CARVEDILOL 12.5 MG PO TABS
12.5000 mg | ORAL_TABLET | Freq: Two times a day (BID) | ORAL | Status: DC
  Administered 2015-07-22 – 2015-07-29 (×10): 12.5 mg via ORAL
  Filled 2015-07-17 (×15): qty 1

## 2015-07-17 MED ORDER — ALBUTEROL SULFATE (2.5 MG/3ML) 0.083% IN NEBU
2.5000 mg | INHALATION_SOLUTION | RESPIRATORY_TRACT | Status: DC | PRN
  Administered 2015-07-17 (×2): 2.5 mg via RESPIRATORY_TRACT
  Filled 2015-07-17 (×2): qty 3

## 2015-07-17 MED ORDER — MORPHINE SULFATE (PF) 2 MG/ML IV SOLN
1.0000 mg | INTRAVENOUS | Status: DC | PRN
Start: 2015-07-17 — End: 2015-07-18
  Administered 2015-07-17: 1 mg via INTRAVENOUS
  Filled 2015-07-17: qty 1

## 2015-07-17 MED ORDER — METOPROLOL TARTRATE 1 MG/ML IV SOLN: 2.5000 mg | Freq: Four times a day (QID) | INTRAVENOUS | Status: DC

## 2015-07-17 MED ORDER — ASPIRIN EC 81 MG PO TBEC
81.0000 mg | DELAYED_RELEASE_TABLET | Freq: Every day | ORAL | Status: DC
  Administered 2015-07-23 – 2015-07-24 (×2): 81 mg via ORAL
  Filled 2015-07-17 (×4): qty 1

## 2015-07-17 MED ORDER — DOCUSATE SODIUM 100 MG PO CAPS
100.0000 mg | ORAL_CAPSULE | ORAL | Status: DC
  Administered 2015-07-23: 100 mg via ORAL
  Filled 2015-07-17 (×5): qty 1

## 2015-07-17 MED ORDER — CYANOCOBALAMIN 500 MCG PO TABS
500.0000 ug | ORAL_TABLET | Freq: Every day | ORAL | Status: DC
  Administered 2015-07-23 – 2015-07-29 (×7): 500 ug via ORAL
  Filled 2015-07-17 (×9): qty 1

## 2015-07-17 MED ORDER — MIRTAZAPINE 15 MG PO TABS
15.0000 mg | ORAL_TABLET | Freq: Every day | ORAL | Status: DC
  Administered 2015-07-22 – 2015-07-29 (×8): 15 mg via ORAL
  Filled 2015-07-17 (×8): qty 1

## 2015-07-17 MED ORDER — AMLODIPINE BESYLATE 10 MG PO TABS
10.0000 mg | ORAL_TABLET | Freq: Every evening | ORAL | Status: DC
  Administered 2015-07-22 – 2015-07-29 (×6): 10 mg via ORAL
  Filled 2015-07-17 (×6): qty 1

## 2015-07-17 MED ORDER — COQ10 50 MG PO CAPS: 50.0000 mg | ORAL_CAPSULE | Freq: Every day | ORAL | Status: DC

## 2015-07-17 MED ORDER — CLONIDINE HCL 0.1 MG PO TABS
0.1000 mg | ORAL_TABLET | Freq: Three times a day (TID) | ORAL | Status: DC
  Administered 2015-07-22 – 2015-07-29 (×13): 0.1 mg via ORAL
  Filled 2015-07-17 (×17): qty 1

## 2015-07-17 MED ORDER — ONDANSETRON HCL 4 MG PO TABS: 4.0000 mg | ORAL_TABLET | Freq: Four times a day (QID) | ORAL | Status: DC | PRN

## 2015-07-17 MED ORDER — VITAMIN C 500 MG PO TABS
500.0000 mg | ORAL_TABLET | Freq: Every day | ORAL | Status: DC
  Administered 2015-07-24 – 2015-07-29 (×6): 500 mg via ORAL
  Filled 2015-07-17 (×8): qty 1

## 2015-07-17 MED ORDER — PREDNISONE 10 MG PO TABS
10.0000 mg | ORAL_TABLET | Freq: Every day | ORAL | Status: DC
  Administered 2015-07-23 – 2015-07-29 (×7): 10 mg via ORAL
  Filled 2015-07-17 (×9): qty 1

## 2015-07-17 MED ORDER — FUROSEMIDE 40 MG PO TABS
40.0000 mg | ORAL_TABLET | Freq: Every day | ORAL | Status: DC
  Administered 2015-07-23 – 2015-07-29 (×6): 40 mg via ORAL
  Filled 2015-07-17 (×9): qty 1

## 2015-07-17 MED ORDER — NITROGLYCERIN 0.4 MG SL SUBL
0.4000 mg | SUBLINGUAL_TABLET | SUBLINGUAL | Status: DC | PRN
Start: 2015-07-17 — End: 2015-07-31

## 2015-07-17 MED ORDER — LATANOPROST 0.005 % OP SOLN
1.0000 [drp] | Freq: Every day | OPHTHALMIC | Status: DC
  Administered 2015-07-17 – 2015-07-28 (×12): 1 [drp] via OPHTHALMIC
  Filled 2015-07-17: qty 2.5

## 2015-07-17 MED ORDER — PANTOPRAZOLE SODIUM 40 MG PO TBEC
40.0000 mg | DELAYED_RELEASE_TABLET | Freq: Two times a day (BID) | ORAL | Status: DC
  Filled 2015-07-17: qty 1

## 2015-07-17 MED ORDER — HYDRALAZINE HCL 50 MG PO TABS
100.0000 mg | ORAL_TABLET | Freq: Three times a day (TID) | ORAL | Status: DC
  Administered 2015-07-22 – 2015-07-26 (×7): 100 mg via ORAL
  Filled 2015-07-17 (×10): qty 2

## 2015-07-17 MED ORDER — ONDANSETRON HCL 4 MG/2ML IJ SOLN
4.0000 mg | Freq: Four times a day (QID) | INTRAMUSCULAR | Status: DC | PRN
  Administered 2015-07-29 – 2015-07-31 (×3): 4 mg via INTRAVENOUS
  Filled 2015-07-17 (×3): qty 2

## 2015-07-17 MED ORDER — DELFLEX-LC/1.5% DEXTROSE 346 MOSM/L IP SOLN
INTRAPERITONEAL | Status: DC
  Filled 2015-07-17 (×8): qty 3000

## 2015-07-17 MED ORDER — ASPIRIN EC 81 MG PO TBEC: 81.0000 mg | DELAYED_RELEASE_TABLET | Freq: Every day | ORAL | Status: DC

## 2015-07-17 MED ORDER — PIPERACILLIN-TAZOBACTAM 3.375 G IVPB
3.3750 g | Freq: Two times a day (BID) | INTRAVENOUS | Status: DC
  Administered 2015-07-17 – 2015-07-25 (×18): 3.375 g via INTRAVENOUS
  Filled 2015-07-17 (×22): qty 50

## 2015-07-17 MED ORDER — HEPARIN SODIUM (PORCINE) 5000 UNIT/ML IJ SOLN
5000.0000 [IU] | Freq: Three times a day (TID) | INTRAMUSCULAR | Status: DC
  Administered 2015-07-17 – 2015-07-26 (×26): 5000 [IU] via SUBCUTANEOUS
  Filled 2015-07-17 (×26): qty 1

## 2015-07-17 MED ORDER — FLUTICASONE PROPIONATE 50 MCG/ACT NA SUSP
2.0000 | Freq: Every day | NASAL | Status: DC
  Administered 2015-07-18 – 2015-07-29 (×10): 2 via NASAL
  Filled 2015-07-17: qty 16

## 2015-07-17 MED ORDER — VANCOMYCIN HCL IN DEXTROSE 1-5 GM/200ML-% IV SOLN
1000.0000 mg | Freq: Once | INTRAVENOUS | Status: DC | PRN
  Filled 2015-07-17: qty 200

## 2015-07-17 MED ORDER — LORATADINE 10 MG PO TABS
10.0000 mg | ORAL_TABLET | Freq: Every day | ORAL | Status: DC
  Administered 2015-07-23 – 2015-07-29 (×7): 10 mg via ORAL
  Filled 2015-07-17 (×9): qty 1

## 2015-07-17 MED ORDER — LACTULOSE 10 GM/15ML PO SOLN: 20.0000 g | Freq: Every day | ORAL | Status: DC | PRN

## 2015-07-17 NOTE — ED Notes (Signed)
Error from vital sign validate HR not 44.

## 2015-07-17 NOTE — Progress Notes (Signed)
Visit made. Patient seen sitting up in bed appeared to be sleeping, no family at bedside. CMRN Raiford NobleRick made aware that patient is [facility], receiving the nursing, PT, SW and aide services. [facility] to [facility] admission on 12/10. She is a DNR.  Patient has been admitted for treatment of possible aspiration pneumonia and is receiving IV antibiotics, she remains on oxygen at 3 liters. Will continue to follow through final disposition.  Dayna BarkerKaren Robertson RN,lBSN, [facility],Hospital Liaison [phone] c

## 2015-07-17 NOTE — Progress Notes (Signed)
PHARMACIST - PHYSICIAN ORDER COMMUNICATION  CONCERNING: P&T Medication Policy on Herbal Medications  DESCRIPTION:  This patient's order for:  Co-Q-10  has been noted.  This product(s) is classified as an "herbal" or natural product. Due to a lack of definitive safety studies or FDA approval, nonstandard manufacturing practices, plus the potential risk of unknown drug-drug interactions while on inpatient medications, the Pharmacy and Therapeutics Committee does not permit the use of "herbal" or natural products of this type within [facility].   ACTION TAKEN: The pharmacy department is unable to verify this order at this time. Please reevaluate patient's clinical condition at discharge and address if the herbal or natural product(s) should be resumed at that time.   

## 2015-07-17 NOTE — ED Notes (Signed)
Pt from home, recent [facility].  Pt vomited at home, then began with a wet cough.  O2 sats decreased family called EMS

## 2015-07-17 NOTE — Progress Notes (Signed)
Pt has not voided all shift. Bladder scan showed 198ml. MD notified, no response or orders given at this time. Will relay the message to the on-coming nurse.  Karsten RoLauren E Hobbs

## 2015-07-17 NOTE — Care Management (Signed)
Spoke with Dayna BarkerKaren Robertson at [facility]. Continue to follow.

## 2015-07-17 NOTE — ED Notes (Signed)
Error in vital validation, hr not 45

## 2015-07-17 NOTE — Consult Note (Signed)
WOC wound consult note Reason for Consult: Stage 3 pressure injury to coccyx present on admission.  Wound type: stage 3 pressure injury.   Boggy heels bilaterally, nonblanchable redness, intact Pressure Ulcer POA: Yes Measurement: Coccyx 1.5 cm x 2.2 cm x 1 cm  Wound ZOX:WRUEbed:pale pink nongranulating Surrounding blanchable redness, moisture associated skin damage (MASD) Drainage (amount, consistency, odor) Scant serous.  No odor.  Periwound:macerated from MASD, extending 1 cm Dressing procedure/placement/frequency: Cleanse sacrococcygeal wound with NS and pat gently dry.  Gently fill wound bed with calcium alginate (Algisite in store room).  Cover with Allevyn silicone border foam dressing.  No disposable briefs or pads under patient. Use Dermatherapy therapeutic linen to wick moisture and promote healing. Turn every 2 hours and PRN.  Offload pressure to heels with a pillow under the calves.   Will not follow at this time.  Please re-consult if needed.  Maple HudsonKaren Jaleisa Brose RN BSN CWON Pager [phone]

## 2015-07-17 NOTE — Progress Notes (Signed)
CCU notified RN of pt having multiple PVCs. MD notified, no new orders given at this time. Will continue to monitor and assess pt.  Karsten RoLauren E Hobbs

## 2015-07-17 NOTE — Progress Notes (Signed)
Initial Nutrition Assessment   INTERVENTION:   Coordination of Care: Per SLP pt to be NPO. Pt c/o dysphagia with difficulty breathing currently. Will follow poc.  Medical Food Supplement Therapy: will recommend Nepro once pt able to safely take po.   NUTRITION DIAGNOSIS:   Inadequate oral intake related to inability to eat as evidenced by NPO status.  GOAL:   Patient will meet greater than or equal to 90% of their needs  MONITOR:    (Energy Intake, Electrolyte and renal Profile, Anthropometrics)  REASON FOR ASSESSMENT:   Diagnosis, Malnutrition Screening Tool (Renal Diet)    ASSESSMENT:   Pt admitted with difficulty breathing with possible aspiration pna. Per H&P pt resided at [facility] for the past month and a half receiving HD. Pt discharged from [facility] P[facility] with the intent of using PD at home. Pt on isolation currently for MRSA. SLP following.  Past Medical History  Diagnosis Date  . Muscle weakness   . Chronic respiratory failure (HCC)   . Pneumonia   . Sleep apnea   . CHF (congestive heart failure) (HCC)   . CKD (chronic kidney disease), stage IV (HCC)   . Dysphagia   . GERD (gastroesophageal reflux disease)   . Back pain   . Epilepsy (HCC)   . Hypertension   . Stroke (HCC)   . Headache   . Arthritis   . Anemia     iron deficiency anemia  . Complication of anesthesia     difficult waking up after surgery (2013)    Diet Order:  Diet NPO time specified    Current Nutrition: Pt did not eat anything this am.   Food/Nutrition-Related History: Unable to clarify this am on visit. No family present this am. Per MST pt with decreased appetite PTA.   Scheduled Medications:  . amLODipine  10 mg Oral QPM  . aspirin EC  81 mg Oral Daily  . carvedilol  12.5 mg Oral Q12H  . [START ON 07/18/2015] Chlorhexidine Gluconate Cloth  6 each Topical Q0600  . cloNIDine  0.1 mg Oral TID  . cyanocobalamin  500 mcg Oral Daily  . docusate sodium  100 mg Oral  QODAY  . fentaNYL  25 mcg Transdermal Q72H  . fluticasone  2 spray Each Nare Daily  . furosemide  40 mg Oral Daily  . heparin  5,000 Units Subcutaneous 3 times per day  . hydrALAZINE  100 mg Oral TID  . latanoprost  1 drop Both Eyes QHS  . loratadine  10 mg Oral Daily  . mirtazapine  15 mg Oral QHS  . multivitamin-lutein  1 capsule Oral BID  . mupirocin ointment  1 application Nasal BID  . pantoprazole  40 mg Oral BID  . phenytoin  300 mg Oral QHS  . piperacillin-tazobactam (ZOSYN)  IV  3.375 g Intravenous Q12H  . predniSONE  10 mg Oral Daily  . ascorbic acid  500 mg Oral Daily     Electrolyte/Renal Profile and Glucose Profile:   Recent Labs Lab 07/17/15 0351  NA 128*  K 3.0*  CL 92*  CO2 27  BUN 23*  CREATININE 2.38*  CALCIUM 9.5  GLUCOSE 134*   Protein Profile: No results for input(s): ALBUMIN in the last 168 hours.  Gastrointestinal Profile: Last BM:  unknown   Nutrition-Focused Physical Exam Findings:  Unable to complete Nutrition-Focused physical exam at this time.    Weight Change: Per CHL weight loss of 14% in 2 months.  Skin:   (Stage III coccyx pressure ulcer)   Height:   Ht Readings from Last 1 Encounters:  07/17/15 5\' 6"  (1.676 m)    Weight:   Wt Readings from Last 1 Encounters:  07/17/15 173 lb 9.6 oz (78.744 kg)    Wt Readings from Last 10 Encounters:  07/17/15 173 lb 9.6 oz (78.744 kg)  06/06/15 193 lb 12.6 oz (87.9 kg)  05/21/15 201 lb 8 oz (91.4 kg)  04/12/15 203 lb (92.08 kg)  04/01/15 203 lb (92.08 kg)  03/16/15 207 lb 12.8 oz (94.257 kg)  03/03/15 205 lb (92.987 kg)  02/02/15 208 lb (94.348 kg)  01/09/15 208 lb (94.348 kg)  12/27/14 208 lb (94.348 kg)     BMI:  Body mass index is 28.03 kg/(m^2).  Estimated Nutritional Needs:   Kcal:  BEE: 1027kcals, TEE: (IF 1.2-1.4)(AF 1.2) 1478-1725kcals, using IBW of 59kg  Protein:  70-89g protein (1.2-1.5g/kg) using IBW of 59kg  Fluid:  UOP+1L  EDUCATION NEEDS:   No  education needs identified at this time   HIGH Care Level  Leda QuailAllyson Dyshaun Bonzo, RD, LDN Pager [phone]

## 2015-07-17 NOTE — Progress Notes (Signed)
Pt daughter Kristina Wilson stated that the pt "takes 5% lidocaine patches and fentanyl patches every 3 days, to help her chronic back pain."  Karsten RoLauren E Hobbs

## 2015-07-17 NOTE — H&P (Signed)
[facility] [place] NAME: Kristina Wilson    MR#:  [id number]  DATE OF BIRTH:  05-Mar-1923  DATE OF ADMISSION:  07/17/2015  PRIMARY CARE PHYSICIAN: Albina Billet, MD   REQUESTING/REFERRING PHYSICIAN: Dr. Owens Shark  CHIEF COMPLAINT:   Chief Complaint  Patient presents with  . Shortness of Breath   cough and congestion following episode of emesis  HISTORY OF PRESENT ILLNESS:  [name]  is a 81 y.o. female with a known history of end-stage renal disease on peritoneal dialysis, chronic respiratory failure on home oxygen, hypertension, CVA, history of congestive heart failure, epilepsy, chronic anemia, bedbound, on total care dependent, on DO NOT RESUSCITATE status was brought in with the complaints of cough and congestion with shortness of breath following episode of emesis at around 3 AM today.  According to the patient's daughter who is her care giver at home, she has been having ongoing nausea for the past few days and she had an episode of emesis following which she developed coughing spell with shortness of breath and hypoxia, hence brought to the emergency room. She was in her usual state of health last night when she went to bed. Denies any fever, cough prior to this event. Denies any chest pain. Patient was recently discharged from [place] to home and was initiated on peritoneal dialysis 2 days ago. Prior to that she was on hemodialysis for the past one and half months while she was at [facility]. She does have a history of chronic respiratory failure and uses home oxygen 2 and half liters per minute.  On arrival to the ED patient was noted to have shortness of breath with stable vitals and afebrile. Chest x-ray revealed bilateral airspace opacities concerning for aspiration versus edema. Lab work revealed sodium 128, potassium 3.0, BUN 23, creatinine 2.38, troponin less than 0.03. CBC unremarkable. EKG normal sinus  rhythm with ventricular rate of 76 bpm. After obtaining blood cultures, patient was started on IV antibiotics-vancomycin and Zosyn, was also given DuoNeb and hospitalist service was consulted for further management.  At the present time patient is resting in the bed and states she is feeling better. Does complain of cough with some congestion, otherwise no chest pain. Patient is hard of hearing.  PAST MEDICAL HISTORY:   Past Medical History  Diagnosis Date  . Muscle weakness   . Chronic respiratory failure ([place])   . Pneumonia   . Sleep apnea   . CHF (congestive heart failure) ([place])   . CKD (chronic kidney disease), stage IV ([place])   . Dysphagia   . GERD (gastroesophageal reflux disease)   . Back pain   . Epilepsy ([place])   . Hypertension   . Stroke ([place])   . Headache   . Arthritis   . Anemia     iron deficiency anemia  . Complication of anesthesia     difficult waking up after surgery (2013)    PAST SURGICAL HISTORY:   Past Surgical History  Procedure Laterality Date  . Back surgery    . Knee surgery    . Carotid endarterectomy    . Tonsillectomy    . Appendectomy    . Joint replacement Bilateral     Knee Replacements  . Dilation and curettage of uterus    . Breast surgery Right     Breast Biopsy  . Capd insertion N/A 04/12/2015    Procedure: LAPAROSCOPIC INSERTION CONTINUOUS AMBULATORY PERITONEAL DIALYSIS  (CAPD) CATHETER;  Surgeon: Katha Cabal, MD;  Location: [facility] ORS;  Service: Vascular;  Laterality: N/A;  . Peripheral vascular catheterization N/A 05/16/2015    Procedure: Dialysis/Perma Catheter Insertion;  Surgeon: Algernon Huxley, MD;  Location: [facility];  Service: Cardiovascular;  Laterality: N/A;    SOCIAL HISTORY:   Social History  Substance Use Topics  . Smoking status: Former Smoker -- 2.00 packs/day for 20 years    Types: Cigarettes    Quit date: 06/04/1967  . Smokeless tobacco: Never Used  . Alcohol Use: No    FAMILY HISTORY:   Family  History  Problem Relation Age of Onset  . CAD Mother   . CAD Father   . Anemia Father   . CVA Sister   . Multiple myeloma Brother     DRUG ALLERGIES:   Allergies  Allergen Reactions  . Enalapril Anaphylaxis  . Tramadol Shortness Of Breath  . Capsaicin Other (See Comments)    Reaction:  Unknown   . Codeine Other (See Comments)    Reaction:  Unknown   . Hydrocodone Other (See Comments)    Reaction:  Unknown   . Vicodin [Hydrocodone-Acetaminophen] Other (See Comments)    Reaction:  Unknown     REVIEW OF SYSTEMS:   Review of Systems  Constitutional: Positive for malaise/fatigue. Negative for fever and chills.  HENT: Positive for hearing loss (hard of hearing +). Negative for ear pain, nosebleeds, sore throat and tinnitus.   Eyes: Negative for blurred vision, double vision, pain, discharge and redness.  Respiratory: Positive for cough, sputum production and shortness of breath. Negative for hemoptysis and wheezing.   Cardiovascular: Negative for chest pain, palpitations, orthopnea and leg swelling.  Gastrointestinal: Positive for nausea and vomiting. Negative for abdominal pain, diarrhea, constipation, blood in stool and melena.  Genitourinary: Negative for dysuria, urgency, frequency and hematuria.  Musculoskeletal: Negative for back pain, joint pain and neck pain.  Skin: Negative for itching and rash.  Neurological: Negative for dizziness, tingling, sensory change, focal weakness and seizures.  Endo/Heme/Allergies: Does not bruise/bleed easily.  Psychiatric/Behavioral: Negative for depression. The patient is not nervous/anxious.     MEDICATIONS AT HOME:   Prior to Admission medications   Medication Sig Start Date End Date Taking? Authorizing Provider  acetaminophen (TYLENOL) 325 MG tablet Take 650 mg by mouth 3 (three) times daily as needed for mild pain, fever or headache.    Yes Historical Provider, MD  amLODipine (NORVASC) 10 MG tablet Take 10 mg by mouth every evening.    Yes Historical Provider, MD  ascorbic acid (VITAMIN C) 500 MG tablet Take 500 mg by mouth daily.   Yes Historical Provider, MD  aspirin EC 81 MG tablet Take 81 mg by mouth daily.   Yes Historical Provider, MD  bimatoprost (LUMIGAN) 0.03 % ophthalmic solution Place 1 drop into both eyes 2 (two) times daily.   Yes Historical Provider, MD  carvedilol (COREG) 12.5 MG tablet Take 12.5 mg by mouth every 12 (twelve) hours.   Yes Historical Provider, MD  cetirizine (ZYRTEC) 10 MG tablet Take 10 mg by mouth at bedtime.   Yes Historical Provider, MD  cloNIDine (CATAPRES) 0.1 MG tablet Take 0.1 mg by mouth 3 (three) times daily.   Yes Historical Provider, MD  Coenzyme Q10 (COQ10) 50 MG CAPS Take 50 mg by mouth daily.   Yes Historical Provider, MD  cyanocobalamin 500 MCG tablet Take 500 mcg by mouth daily.   Yes Historical Provider, MD  docusate sodium (COLACE)  100 MG capsule Take 100 mg by mouth every other day.   Yes Historical Provider, MD  fentaNYL (DURAGESIC - DOSED MCG/HR) 25 MCG/HR patch Place 25 mcg onto the skin every 3 (three) days.   Yes Historical Provider, MD  fluticasone (FLONASE) 50 MCG/ACT nasal spray Place 2 sprays into both nostrils daily.   Yes Historical Provider, MD  furosemide (LASIX) 40 MG tablet Take 40 mg by mouth daily.   Yes Historical Provider, MD  gentamicin ointment (GARAMYCIN) 0.1 % Apply 1 application topically 3 (three) times daily. Apply to left side port three times daily.   Yes Historical Provider, MD  hydrALAZINE (APRESOLINE) 100 MG tablet Take 100 mg by mouth 3 (three) times daily.   Yes Historical Provider, MD  lactulose (CHRONULAC) 10 GM/15ML solution Take 20 g by mouth daily as needed for mild constipation.   Yes Historical Provider, MD  lidocaine (LIDODERM) 5 % Place 1 patch onto the skin daily. Remove & Discard patch within 12 hours or as directed by MD   Yes Historical Provider, MD  mirtazapine (REMERON) 15 MG tablet Take 15 mg by mouth at bedtime.   Yes Historical  Provider, MD  Multiple Vitamins-Minerals (PRESERVISION AREDS PO) Take 1 capsule by mouth 2 (two) times daily.   Yes Historical Provider, MD  nitroGLYCERIN (NITROSTAT) 0.4 MG SL tablet Place 0.4 mg under the tongue every 5 (five) minutes as needed for chest pain.   Yes Historical Provider, MD  oxycodone (OXY-IR) 5 MG capsule Take 5 mg by mouth every 4 (four) hours as needed for pain.   Yes Historical Provider, MD  pantoprazole (PROTONIX) 40 MG tablet Take 40 mg by mouth 2 (two) times daily.   Yes Historical Provider, MD  phenytoin (DILANTIN) 100 MG ER capsule Take 300 mg by mouth at bedtime.    Yes Historical Provider, MD  predniSONE (DELTASONE) 10 MG tablet Take 10 mg by mouth daily.   Yes Historical Provider, MD      VITAL SIGNS:  Blood pressure 138/62, pulse 35, resp. rate 12, height 5' 6"  (1.676 m), weight 81.647 kg (180 lb), SpO2 100 %.  PHYSICAL EXAMINATION:  Physical Exam  Constitutional: She is oriented to person, place, and time. She appears well-developed and well-nourished. No distress (No acute distress).  HENT:  Head: Normocephalic and atraumatic.  Right Ear: External ear normal.  Left Ear: External ear normal.  Nose: Nose normal.  Mouth/Throat: Oropharynx is clear and moist. No oropharyngeal exudate.  Hard of hearing  Eyes: EOM are normal. Pupils are equal, round, and reactive to light. No scleral icterus.  Neck: Normal range of motion. Neck supple. No JVD present. No thyromegaly present.  Cardiovascular: Normal rate, regular rhythm, normal heart sounds and intact distal pulses.  Exam reveals no friction rub.   No murmur heard. Respiratory: Effort normal. No respiratory distress. She has no wheezes. She has rales (At both bases). She exhibits no tenderness.  PermCath in place  GI: Soft. Bowel sounds are normal. She exhibits no distension and no mass. There is no tenderness. There is no rebound and no guarding.  PD catheter in place  Musculoskeletal: Normal range of motion.  She exhibits no edema.  Lymphadenopathy:    She has no cervical adenopathy.  Neurological: She is alert and oriented to person, place, and time. She has normal reflexes. She displays normal reflexes. No cranial nerve deficit. She exhibits normal muscle tone.  Skin: Skin is warm. No rash noted. There is erythema (Both heels +).  Decubitus ulcer over back  Psychiatric: She has a normal mood and affect. Her behavior is normal. Thought content normal.   LABORATORY PANEL:   CBC  Recent Labs Lab 07/17/15 0351  WBC 7.1  HGB 12.2  HCT 38.5  PLT 252   ------------------------------------------------------------------------------------------------------------------  Chemistries   Recent Labs Lab 07/17/15 0351  NA 128*  K 3.0*  CL 92*  CO2 27  GLUCOSE 134*  BUN 23*  CREATININE 2.38*  CALCIUM 9.5   ------------------------------------------------------------------------------------------------------------------  Cardiac Enzymes  Recent Labs Lab 07/17/15 0351  TROPONINI <0.03   ------------------------------------------------------------------------------------------------------------------  RADIOLOGY:  Dg Chest Port 1 View  07/17/2015  CLINICAL DATA:  Acute onset of cough and vomiting. Congestion. Possible aspiration. Initial encounter. EXAM: PORTABLE CHEST 1 VIEW COMPARISON:  Chest radiograph performed 06/03/2015 FINDINGS: The lungs are well-aerated. A small right pleural effusion is noted. Bibasilar airspace opacities may reflect mild interstitial edema. No pneumothorax is seen. The cardiomediastinal silhouette is mildly enlarged. A right-sided dual-lumen catheter is noted ending about the mid to distal SVC. No acute osseous abnormalities are seen. IMPRESSION: Small right pleural effusion noted. Bibasilar airspace opacities may reflect mild interstitial edema. Mild cardiomegaly. Electronically Signed   By: Garald Balding M.D.   On: 07/17/2015 03:46    EKG:   Orders placed  or performed during the hospital encounter of 07/17/15  . ED EKG normal sinus rhythm with ventricular rate of 76 minute, nonspecific ST-T changes.   . ED EKG  . EKG 12-Lead  . EKG 12-Lead    IMPRESSION AND PLAN:   82 year old female with history of multiple medical problems, DO NOT RESUSCITATE status presents with the complaints of cough with congestion and shortness of breath following episode of emesis, chest x-ray with bilateral airspace opacities concerning for aspiration versus edema.  1. Cough with congestion and shortness of breath following episode of vomiting- chest x-ray with bilateral airspace opacities concerning for aspiration. 2. Chronic respiratory failure on home oxygen 2.5 L/m. 3. End-stage renal disease on peritoneal dialysis. Patient was on hemodialysis for one and half months and started on peritoneal dialysis 2 days ago. Patient known to nephrology service.  4. Hypertension, stable on home medications 5. History of congestive heart failure. 6. History of CVA, no significant residual deficit. patient bedbound, on total care dependent. 7. History of seizure disorder, stable on Dilantin.  Plan: Admit, continue IV antibiotics-vancomycin and Zosyn, O2 supplementation, DuoNeb's, monitor O2 sats. Follow-up CBC, blood cultures and sputum cultures. -Nephrology consultation requested for continuation of peritoneal dialysis. -Continue home medications for hypertension and seizure disorder. - Wound care consultation requested.    All the records are reviewed and case discussed with ED provider. Management plans discussed with the patient, family and they are in agreement.  CODE STATUS: DO NOT RESUSCITATE, discussed with patient's family.  DVT prophylaxis-subcutaneous heparin  TOTAL TIME TAKING CARE OF THIS PATIENT: 50 minutes.    Juluis Mire M.D on 07/17/2015 at 5:45 AM  Between 7am to 6pm - Pager - [phone]  After 6pm go to [email or web address] - password [email or web address]  [facility]  Office  [phone]  CC: Primary care physician; Albina Billet, MD

## 2015-07-17 NOTE — Progress Notes (Signed)
ANTIBIOTIC CONSULT NOTE - INITIAL  Pharmacy Consult for Vancomycin/Zosyn Indication: pneumonia  Allergies  Allergen Reactions  . Enalapril Anaphylaxis  . Tramadol Shortness Of Breath  . Capsaicin Other (See Comments)    Reaction:  Unknown   . Codeine Other (See Comments)    Reaction:  Unknown   . Hydrocodone Other (See Comments)    Reaction:  Unknown   . Vicodin [Hydrocodone-Acetaminophen] Other (See Comments)    Reaction:  Unknown     Patient Measurements: Height:  (167.6 cm) Weight: 173 lb 9.6 oz (78.744 kg) IBW/kg (Calculated) : 59.3 Adjusted Body Weight: 67 kg  Vital Signs: Temp: 97.5 F (36.4 C) (12/14 0646) Temp Source: Oral (12/14 0646) BP: 135/56 mmHg (12/14 0646) Pulse Rate: 73 (12/14 0646) Intake/Output from previous day:   Intake/Output from this shift:    Labs:  Recent Labs  07/17/15 0351  WBC 7.1  HGB 12.2  PLT 252  CREATININE 2.38*   Estimated Creatinine Clearance: 16.6 mL/min (by C-G formula based on Cr of 2.38). No results for input(s): VANCOTROUGH, VANCOPEAK, VANCORANDOM, GENTTROUGH, GENTPEAK, GENTRANDOM, TOBRATROUGH, TOBRAPEAK, TOBRARND, AMIKACINPEAK, AMIKACINTROU, AMIKACIN in the last 72 hours.   Microbiology: Recent Results (from the past 720 hour(s))  MRSA PCR Screening     Status: Abnormal   Collection Time: 07/17/15  6:51 AM  Result Value Ref Range Status   MRSA by PCR POSITIVE (A) NEGATIVE Final    Comment:        The GeneXpert MRSA Assay (FDA approved for NASAL specimens only), is one component of a comprehensive MRSA colonization surveillance program. It is not intended to diagnose MRSA infection nor to guide or monitor treatment for MRSA infections. CRITICAL RESULT CALLED TO, READ BACK BY AND VERIFIED WITH: Yehuda Savannah @ 07/16/16 by [facility]     Medical History: Past Medical History  Diagnosis Date  . Muscle weakness   . Chronic respiratory failure ([facility])   . Pneumonia   . Sleep apnea   . CHF (congestive heart  failure) ([facility])   . CKD (chronic kidney disease), stage IV ([facility])   . Dysphagia   . GERD (gastroesophageal reflux disease)   . Back pain   . Epilepsy ([facility])   . Hypertension   . Stroke ([facility])   . Headache   . Arthritis   . Anemia     iron deficiency anemia  . Complication of anesthesia     difficult waking up after surgery (2013)    Medications:  Scheduled:  . amLODipine  10 mg Oral QPM  . aspirin EC  81 mg Oral Daily  . carvedilol  12.5 mg Oral Q12H  . cloNIDine  0.1 mg Oral TID  . cyanocobalamin  500 mcg Oral Daily  . docusate sodium  100 mg Oral QODAY  . fentaNYL  25 mcg Transdermal Q72H  . fluticasone  2 spray Each Nare Daily  . furosemide  40 mg Oral Daily  . heparin  5,000 Units Subcutaneous 3 times per day  . hydrALAZINE  100 mg Oral TID  . latanoprost  1 drop Both Eyes QHS  . loratadine  10 mg Oral Daily  . mirtazapine  15 mg Oral QHS  . multivitamin-lutein  1 capsule Oral BID  . pantoprazole  40 mg Oral BID  . phenytoin  300 mg Oral QHS  . piperacillin-tazobactam (ZOSYN)  IV  3.375 g Intravenous Q12H  . predniSONE  10 mg Oral Daily  . ascorbic acid  500 mg Oral Daily  Assessment: Patient is a 81 yo female with orders for Vancomycin and Zosyn for empiric treatment of pneumonia.  Per notes, patient has ESRD requiring peritoneal dialysis.   SCr: 2.38  Goal of Therapy:  Goal trough of 15 in PD patients  Plan:  Patient received Vancomycin 1 gm IV once at 0435 and Zosyn 3.375 gm IV one at 0435.  Will place prn orders for Vancomycin 1 gm IV once (15 mg/kg based on adjusted body weight) to be given when trough is ~15.  Trough ordered for day 3 of therapy.    Will order Zosyn 3.375 gm IV q12h based on est CrCl/ESRD.    Pharmacy will continue to follow.   Follow up culture results  Khameron Gruenwald G 07/17/2015,11:36 AM

## 2015-07-17 NOTE — ED Provider Notes (Signed)
[facility] Emergency Department Provider Note  ____________________________________________  Time seen: 3:25 AM  I have reviewed the triage vital signs and the nursing notes.   HISTORY  Chief Complaint Shortness of Breath      HPI Kristina Wilson is a 81 y.o. female presents from home via EMS with a history of nonbloody emesis at home followed by persistent productive cough since and difficulty breathing. Per patient's family oxygen saturation is at home and decrease since the event.     Past Medical History  Diagnosis Date  . Muscle weakness   . Chronic respiratory failure ([place])   . Pneumonia   . Sleep apnea   . CHF (congestive heart failure) ([place])   . CKD (chronic kidney disease), stage IV ([place])   . Dysphagia   . GERD (gastroesophageal reflux disease)   . Back pain   . Epilepsy ([place])   . Hypertension   . Stroke ([place])   . Headache   . Arthritis   . Anemia     iron deficiency anemia  . Complication of anesthesia     difficult waking up after surgery (2013)    Patient Active Problem List   Diagnosis Date Noted  . Altered mental status 05/29/2015  . Pressure ulcer 05/15/2015  . Accelerated hypertension 05/08/2015  . Elevated troponin 05/08/2015  . GERD (gastroesophageal reflux disease) 05/08/2015  . Epilepsy ([place]) 05/08/2015  . Chronic CHF (congestive heart failure) ([place]) 05/08/2015  . UTI (urinary tract infection) 03/18/2015  . Acute sinusitis 03/18/2015  . Weakness of both lower limbs 03/15/2015  . GI bleed 12/27/2014  . Hypotension 12/27/2014  . Anemia of chronic disease 12/27/2014  . Acute posthemorrhagic anemia 12/27/2014  . CKD (chronic kidney disease), stage IV ([place]) 12/27/2014  . Cellulitis 12/27/2014    Past Surgical History  Procedure Laterality Date  . Back surgery    . Knee surgery    . Carotid endarterectomy    . Tonsillectomy    . Appendectomy    . Joint replacement Bilateral     Knee Replacements  .  Dilation and curettage of uterus    . Breast surgery Right     Breast Biopsy  . Capd insertion N/A 04/12/2015    Procedure: LAPAROSCOPIC INSERTION CONTINUOUS AMBULATORY PERITONEAL DIALYSIS  (CAPD) CATHETER;  Surgeon: Katha Cabal, MD;  Location: [facility] ORS;  Service: Vascular;  Laterality: N/A;  . Peripheral vascular catheterization N/A 05/16/2015    Procedure: Dialysis/Perma Catheter Insertion;  Surgeon: Algernon Huxley, MD;  Location: [place] [facility];  Service: Cardiovascular;  Laterality: N/A;    Current Outpatient Rx  Name  Route  Sig  Dispense  Refill  . acetaminophen (TYLENOL) 325 MG tablet   Oral   Take 650 mg by mouth 3 (three) times daily as needed for mild pain, fever or headache.          Marland Kitchen amLODipine (NORVASC) 10 MG tablet   Oral   Take 10 mg by mouth every evening.         Marland Kitchen ascorbic acid (VITAMIN C) 500 MG tablet   Oral   Take 500 mg by mouth daily.         Marland Kitchen aspirin EC 81 MG tablet   Oral   Take 81 mg by mouth daily.         . bimatoprost (LUMIGAN) 0.03 % ophthalmic solution   Both Eyes   Place 1 drop into both eyes 2 (two) times daily.         Marland Kitchen  carvedilol (COREG) 12.5 MG tablet   Oral   Take 12.5 mg by mouth every 12 (twelve) hours.         . cetirizine (ZYRTEC) 10 MG tablet   Oral   Take 10 mg by mouth at bedtime.         . cloNIDine (CATAPRES) 0.1 MG tablet   Oral   Take 0.1 mg by mouth 3 (three) times daily.         . Coenzyme Q10 (COQ10) 50 MG CAPS   Oral   Take 50 mg by mouth daily.         . cyanocobalamin 500 MCG tablet   Oral   Take 500 mcg by mouth daily.         Marland Kitchen docusate sodium (COLACE) 100 MG capsule   Oral   Take 100 mg by mouth every other day.         . fentaNYL (DURAGESIC - DOSED MCG/HR) 25 MCG/HR patch   Transdermal   Place 25 mcg onto the skin every 3 (three) days.         . fluticasone (FLONASE) 50 MCG/ACT nasal spray   Each Nare   Place 2 sprays into both nostrils daily.         .  furosemide (LASIX) 40 MG tablet   Oral   Take 40 mg by mouth daily.         Marland Kitchen gentamicin ointment (GARAMYCIN) 0.1 %   Topical   Apply 1 application topically 3 (three) times daily. Apply to left side port three times daily.         . hydrALAZINE (APRESOLINE) 100 MG tablet   Oral   Take 100 mg by mouth 3 (three) times daily.         Marland Kitchen lactulose (CHRONULAC) 10 GM/15ML solution   Oral   Take 20 g by mouth daily as needed for mild constipation.         . lidocaine (LIDODERM) 5 %   Transdermal   Place 1 patch onto the skin daily. Remove & Discard patch within 12 hours or as directed by MD         . mirtazapine (REMERON) 15 MG tablet   Oral   Take 15 mg by mouth at bedtime.         . Multiple Vitamins-Minerals (PRESERVISION AREDS PO)   Oral   Take 1 capsule by mouth 2 (two) times daily.         . nitroGLYCERIN (NITROSTAT) 0.4 MG SL tablet   Sublingual   Place 0.4 mg under the tongue every 5 (five) minutes as needed for chest pain.         Marland Kitchen oxycodone (OXY-IR) 5 MG capsule   Oral   Take 5 mg by mouth every 4 (four) hours as needed for pain.         . pantoprazole (PROTONIX) 40 MG tablet   Oral   Take 40 mg by mouth 2 (two) times daily.         . phenytoin (DILANTIN) 100 MG ER capsule   Oral   Take 300 mg by mouth at bedtime.          . predniSONE (DELTASONE) 10 MG tablet   Oral   Take 10 mg by mouth daily.           Allergies Enalapril; Tramadol; Capsaicin; Codeine; Hydrocodone; and Vicodin  Family History  Problem Relation Age of Onset  . CAD Mother   .  CAD Father   . Anemia Father   . CVA Sister   . Multiple myeloma Brother     Social History Social History  Substance Use Topics  . Smoking status: Former Smoker -- 2.00 packs/day for 20 years    Types: Cigarettes    Quit date: 06/04/1967  . Smokeless tobacco: Never Used  . Alcohol Use: No    Review of Systems  Constitutional: Negative for fever. Eyes: Negative for visual  changes. ENT: Negative for sore throat. Cardiovascular: Negative for chest pain. Respiratory: Positive for cough and shortness of breath Gastrointestinal: Negative for abdominal pain, vomiting and diarrhea. Genitourinary: Negative for dysuria. Musculoskeletal: Negative for back pain. Skin: Negative for rash. Neurological: Negative for headaches, focal weakness or numbness.   10-point ROS otherwise negative.  ____________________________________________   PHYSICAL EXAM:  VITAL SIGNS: ED Triage Vitals  Enc Vitals Group     BP 07/17/15 0325 135/60 mmHg     Pulse Rate 07/17/15 0325 76     Resp 07/17/15 0325 22     Temp --      Temp src --      SpO2 07/17/15 0325 100 %     Weight 07/17/15 0326 180 lb (81.647 kg)     Height 07/17/15 0326 5' 6"  (1.676 m)     Head Cir --      Peak Flow --      Pain Score --      Pain Loc --      Pain Edu? --      Excl. in [place]? --      Constitutional: Alert and oriented. Apparent respiratory distress, actively coughing Eyes: Conjunctivae are normal. PERRL. Normal extraocular movements. ENT   Head: Normocephalic and atraumatic.   Nose: No congestion/rhinnorhea.   Mouth/Throat: Mucous membranes are moist.   Neck: No stridor. Hematological/Lymphatic/Immunilogical: No cervical lymphadenopathy. Cardiovascular: Normal rate, regular rhythm. Normal and symmetric distal pulses are present in all extremities. No murmurs, rubs, or gallops. Respiratory: Tachypnea and accessory muscle use and bibasilar rhonchi Gastrointestinal: Soft and nontender. No distention. There is no CVA tenderness. Genitourinary: deferred Musculoskeletal: Nontender with normal range of motion in all extremities. No joint effusions.  No lower extremity tenderness nor edema. Neurologic:  Normal speech and language. No gross focal neurologic deficits are appreciated. Speech is normal.  Skin:  Skin is warm, dry and intact. No rash noted. Psychiatric: Mood and affect are  normal. Speech and behavior are normal. Patient exhibits appropriate insight and judgment.  ____________________________________________    LABS (pertinent positives/negatives)  Labs Reviewed  BASIC METABOLIC PANEL - Abnormal; Notable for the following:    Sodium 128 (*)    Potassium 3.0 (*)    Chloride 92 (*)    Glucose, Bld 134 (*)    BUN 23 (*)    Creatinine, Ser 2.38 (*)    GFR calc non Af Amer 17 (*)    GFR calc Af Amer 20 (*)    All other components within normal limits  CBC - Abnormal; Notable for the following:    MCHC 31.9 (*)    RDW 18.6 (*)    All other components within normal limits  CULTURE, BLOOD (ROUTINE X 2)  CULTURE, BLOOD (ROUTINE X 2)  TROPONIN I     ____________________________________________   EKG ED ECG REPORT I, BROWN, Guthrie N, the attending physician, personally viewed and interpreted this ECG.   Date: 07/17/2015  EKG Time: 3:30 AM  Rate: 76  Rhythm: Normal sinus rhythm  Axis: None  Intervals: Normal  ST&T Change: None   RADIOLOGY     DG Chest Port 1 View (Final result) Result time: 07/17/15 03:46:36   Final result by Rad Results In Interface (07/17/15 03:46:36)   Narrative:   CLINICAL DATA: Acute onset of cough and vomiting. Congestion. Possible aspiration. Initial encounter.  EXAM: PORTABLE CHEST 1 VIEW  COMPARISON: Chest radiograph performed 06/03/2015  FINDINGS: The lungs are well-aerated. A small right pleural effusion is noted. Bibasilar airspace opacities may reflect mild interstitial edema. No pneumothorax is seen.  The cardiomediastinal silhouette is mildly enlarged. A right-sided dual-lumen catheter is noted ending about the mid to distal SVC. No acute osseous abnormalities are seen.  IMPRESSION: Small right pleural effusion noted. Bibasilar airspace opacities may reflect mild interstitial edema. Mild cardiomegaly.   Electronically Signed By: Garald Balding M.D. On: 07/17/2015 03:46         Critical Care performed: CRITICAL CARE Performed by: Marjean Donna N   Total critical care time: 30 minutes  Critical care time was exclusive of separately billable procedures and treating other patients.  Critical care was necessary to treat or prevent imminent or life-threatening deterioration.  Critical care was time spent personally by me on the following activities: development of treatment plan with patient and/or surrogate as well as nursing, discussions with consultants, evaluation of patient's response to treatment, examination of patient, obtaining history from patient or surrogate, ordering and performing treatments and interventions, ordering and review of laboratory studies, ordering and review of radiographic studies, pulse oximetry and re-evaluation of patient's condition.   ____________________________________________   INITIAL IMPRESSION / ASSESSMENT AND PLAN / ED COURSE  Pertinent labs & imaging results that were available during my care of the patient were reviewed by me and considered in my medical decision making (see chart for details). History of physical exam concerning for possible aspiration pneumonia. As such IV vancomycin and Zosyn given as well asDuoNeb administered 2. Patient discussed with Dr. Reece Levy for hospital admission for further evaluation and management. ____________________________________________   FINAL CLINICAL IMPRESSION(S) / ED DIAGNOSES  Final diagnoses:  None      Gregor Hams, MD 07/17/15 [phone]

## 2015-07-17 NOTE — Evaluation (Signed)
Clinical/Bedside Swallow Evaluation Patient Details  Name: Kristina RhodesJacquelyn R Spearman MRN: [id number] Date of Birth: June 03, 1923  Today's Date: 07/17/2015 Time: SLP Start Time (ACUTE ONLY): 1102 SLP Stop Time (ACUTE ONLY): 1202 SLP Time Calculation (min) (ACUTE ONLY): 60 min  Past Medical History:  Past Medical History  Diagnosis Date  . Muscle weakness   . Chronic respiratory failure ([facility])   . Pneumonia   . Sleep apnea   . CHF (congestive heart failure) ([facility])   . CKD (chronic kidney disease), stage IV ([facility])   . Dysphagia   . GERD (gastroesophageal reflux disease)   . Back pain   . Epilepsy ([facility])   . Hypertension   . Stroke ([facility])   . Headache   . Arthritis   . Anemia     iron deficiency anemia  . Complication of anesthesia     difficult waking up after surgery (2013)   Past Surgical History:  Past Surgical History  Procedure Laterality Date  . Back surgery    . Knee surgery    . Carotid endarterectomy    . Tonsillectomy    . Appendectomy    . Joint replacement Bilateral     Knee Replacements  . Dilation and curettage of uterus    . Breast surgery Right     Breast Biopsy  . Capd insertion N/A 04/12/2015    Procedure: LAPAROSCOPIC INSERTION CONTINUOUS AMBULATORY PERITONEAL DIALYSIS  (CAPD) CATHETER;  Surgeon: Renford DillsGregory G Schnier, MD;  Location: [facility] ORS;  Service: Vascular;  Laterality: N/A;  . Peripheral vascular catheterization N/A 05/16/2015    Procedure: Dialysis/Perma Catheter Insertion;  Surgeon: Annice NeedyJason S Dew, MD;  Location: [facility] INVASIVE CV LAB;  Service: Cardiovascular;  Laterality: N/A;   HPI:  Pt is a 81 y.o. female with a known history of end-stage renal disease on peritoneal dialysis, chronic respiratory failure on home oxygen, hypertension, CVA, history of congestive heart failure, epilepsy, chronic anemia, bedbound, on total care dependent, on DO NOT RESUSCITATE status was brought in with the complaints of cough and congestion with shortness of breath following episode  of emesis at around 3 AM today. According to the patient's daughter who is her care giver at home, she has been having ongoing nausea for the past few days and she had an episode of emesis following which she developed coughing spell with shortness of breath and hypoxia, hence brought to the emergency room. Patient was recently discharged [facility] home and was initiated on peritoneal dialysis 2 days ago. Prior to that she was on hemodialysis for the past one and half months while she was at [facility]. She does have a history of chronic respiratory failure and uses home oxygen 2 and half liters. Pt awakened for SLP; extremently HOH. She exhibited a constant, mild cough w/ reduced productivity for clearing upper airway secretions. She answered only a few basic questions and attempted to follow commands but did not interact much. Increased respiratory effort w/ exertion was noted. Pt stated "I cannot swallow" x2. Currently on a regular diet.    Assessment / Plan / Recommendation Clinical Impression  Pt appears to present w/ increased risk for aspiration c/b immediate coughing occuring w/ trial of thin liquid via pinched straw. Pt did not immediately exhibit similar coughing w/ trials of single ice chips, however, pt presented w/ constant congested coughing before, during, and oafter the eval sec. to declined respiratory status. (suspect pt may have aspirated emesis post N/V last PM/AM). Pt appeared extremely fatigued w/  any exertion and poorly focused on taking po trials w/ SLP. This declined attention and energy appeared to impact the oral phase for bolus management as well. Pt appears to present w/ too high a risk for aspiration of po's at this time; rec. NPO status w/ pleasure PRN ice chips w/ strict aspiration precautions and NSG supervision. ST will f/u to continue assessment in hopes of initiating an oral diet next 1-2 days. MD updated and agreed. NSG updated.     Aspiration Risk   Severe aspiration risk    Diet Recommendation  NPO; pleasure single ice chips when appropriate following strict aspiration precautions - NSG supervision.   Medication Administration: Via alternative means    Other  Recommendations Recommended Consults:  (Palliative care consult per MD) Oral Care Recommendations: Oral care QID;Staff/trained caregiver to provide oral care Other Recommendations:  (TBD)   Follow up Recommendations  Skilled Nursing facility (TBD)    Frequency and Duration min 3x week  1 week       Prognosis Prognosis for Safe Diet Advancement: Guarded Barriers to Reach Goals: Cognitive deficits;Severity of deficits (medical status)      Swallow Study   General Date of Onset: 07/17/15 HPI: Pt is a 81 y.o. female with a known history of end-stage renal disease on peritoneal dialysis, chronic respiratory failure on home oxygen, hypertension, CVA, history of congestive heart failure, epilepsy, chronic anemia, bedbound, on total care dependent, on DO NOT RESUSCITATE status was brought in with the complaints of cough and congestion with shortness of breath following episode of emesis at around 3 AM today. According to the patient's daughter who is her care giver at home, she has been having ongoing nausea for the past few days and she had an episode of emesis following which she developed coughing spell with shortness of breath and hypoxia, hence brought to the emergency room. Patient was recently discharged from [facility] to home and was initiated on peritoneal dialysis 2 days ago. Prior to that she was on hemodialysis for the past one and half months while she was at [facility]. She does have a history of chronic respiratory failure and uses home oxygen 2 and half liters. Pt awakened for SLP; extremently HOH. She exhibited a constant, mild cough w/ reduced productivity for clearing upper airway secretions. She answered only a few basic questions and attempted to follow  commands but did not interact much. Increased respiratory effort w/ exertion was noted. Pt stated "I cannot swallow" x2. Currently on a regular diet.  Type of Study: Bedside Swallow Evaluation Previous Swallow Assessment: undetermined dysphagia noted in chart Diet Prior to this Study:  (unknown - suspect regular foods per pt report) Temperature Spikes Noted: No (wbc 7.1) Respiratory Status: Nasal cannula (3 liters) History of Recent Intubation: No Behavior/Cognition: Lethargic/Drowsy;Distractible;Requires cueing;Doesn't follow directions (awake when spoken to; [facility]) Oral Cavity Assessment: Dried secretions;Dry Oral Care Completed by SLP: Yes Oral Cavity - Dentition: Missing dentition Vision:  (did not feed self) Self-Feeding Abilities: Total assist Patient Positioning: Upright in bed Baseline Vocal Quality: Low vocal intensity Volitional Cough: Weak;Congested Volitional Swallow: Unable to elicit    Oral/Motor/Sensory Function Overall Oral Motor/Sensory Function:  (pt did not follow through sufficiently for assessment)   Ice Chips Ice chips: Impaired Presentation: Spoon (fed; 3 trials) Oral Phase Impairments: Poor awareness of bolus;Reduced lingual movement/coordination Oral Phase Functional Implications:  (increased oral phase time; lack of manipulation/effort) Pharyngeal Phase Impairments:  (none suspected) Other Comments: constant, mild cough at baseline  Thin Liquid Thin Liquid: Impaired Presentation:  (fed via pinched straw; 1 trial) Oral Phase Impairments: Reduced lingual movement/coordination;Poor awareness of bolus Oral Phase Functional Implications:  (increased oral phase time) Pharyngeal  Phase Impairments: Cough - Immediate    Nectar Thick Nectar Thick Liquid: Not tested   Honey Thick Honey Thick Liquid: Not tested   Puree Puree: Not tested   Solid Solid: Not tested      Jerilynn Som, MS, CCC-SLP  Watson,Katherine 07/17/2015,3:08 PM

## 2015-07-17 NOTE — Progress Notes (Addendum)
Pt recently told RN that she could not swallow. MD notified due to medication due at 10  O'clock are PO. MD order Speech evaluation prior to lunch due to current situation of pt, and to hold 10 O'clock medications until speech evaluation is done . Will continue to monitor pt.   Karsten RoLauren E Hobbs

## 2015-07-17 NOTE — Progress Notes (Signed)
PT Cancellation Note  Patient Details Name: Kristina Wilson MRN: [id number] DOB: 12/03/1922   Cancelled Treatment:    Reason Eval/Treat Not Completed: Medical issues which prohibited therapy (Nursing recommending holding PT today (d/t PVC's and pain issues)).  Will re-attempt PT eval at a later date/time.   Irving Burtonmily Lister Brizzi 07/17/2015, 3:13 PM Hendricks LimesEmily Vernia Teem, PT [phone]

## 2015-07-17 NOTE — Progress Notes (Signed)
Dr Elisabeth PigeonVachhani came and saw pt due to consistent PVCs. Dr. Elisabeth PigeonVachhani is aware of pt's VS. BP 135/56, HR 73. New orders were given to not give pt any medication by mouth for the day, and to only give zosyn an vancomycin IV for today. Will continue to monitor HR, cardiac monitoring, and pt.   Karsten RoLauren E Hobbs

## 2015-07-17 NOTE — Progress Notes (Signed)
Pre PD start

## 2015-07-17 NOTE — Progress Notes (Signed)
[facility]  ROUNDING NOTE   Subjective:  Patient well-known to [place]. She recently was started on peritoneal dialysis. She's been having nausea and vomiting at home and then subsequently developed shortness of breath with hypoxia. She is unable to offer any significant history at this point in time.  Next item she is very hard of hearing. She was found to be coughing at the bedside.   Objective:  Vital signs in last 24 hours:  Temp:  [97.5 F (36.4 C)-98.1 F (36.7 C)] 98.1 F (36.7 C) (12/14 1252) Pulse Rate:  [35-100] 100 (12/14 1252) Resp:  [10-23] 18 (12/14 1252) BP: (114-160)/(49-127) 134/84 mmHg (12/14 1305) SpO2:  [88 %-100 %] 99 % (12/14 1252) Weight:  [78.744 kg (173 lb 9.6 oz)-81.647 kg (180 lb)] 78.744 kg (173 lb 9.6 oz) (12/14 0646)  Weight change:  Filed Weights   07/17/15 0326 07/17/15 0646  Weight: 81.647 kg (180 lb) 78.744 kg (173 lb 9.6 oz)    Intake/Output:     Intake/Output this shift:     Physical Exam: General: Frail appearing  Head: Normocephalic, atraumatic. Moist oral mucosal membranes  Eyes: Anicteric  Neck: Supple, trachea midline  Lungs:  Bilateral rhonchi, normal effort  Heart: S1S2 no rubs  Abdomen:  Soft, nontender, BS present   Extremities: 1+ peripheral edema.  Neurologic: Awake, hard of hearing, follows commands  Skin: No lesions  Access: PD catheter in place    Basic Metabolic Panel:  Recent Labs Lab 07/17/15 0351  NA 128*  K 3.0*  CL 92*  CO2 27  GLUCOSE 134*  BUN 23*  CREATININE 2.38*  CALCIUM 9.5    Liver Function Tests: No results for input(s): AST, ALT, ALKPHOS, BILITOT, PROT, ALBUMIN in the last 168 hours. No results for input(s): LIPASE, AMYLASE in the last 168 hours. No results for input(s): AMMONIA in the last 168 hours.  CBC:  Recent Labs Lab 07/17/15 0351  WBC 7.1  HGB 12.2  HCT 38.5  MCV 95.9  PLT 252    Cardiac Enzymes:  Recent Labs Lab 07/17/15 0351  TROPONINI <0.03     BNP: Invalid input(s): POCBNP  CBG: No results for input(s): GLUCAP in the last 168 hours.  Microbiology: Results for orders placed or performed during the hospital encounter of 07/17/15  MRSA PCR Screening     Status: Abnormal   Collection Time: 07/17/15  6:51 AM  Result Value Ref Range Status   MRSA by PCR POSITIVE (A) NEGATIVE Final    Comment:        The GeneXpert MRSA Assay (FDA approved for NASAL specimens only), is one component of a comprehensive MRSA colonization surveillance program. It is not intended to diagnose MRSA infection nor to guide or monitor treatment for MRSA infections. CRITICAL RESULT CALLED TO, READ BACK BY AND VERIFIED WITH: Yehuda Savannah @ 07/16/16 by [facility]     Coagulation Studies: No results for input(s): LABPROT, INR in the last 72 hours.  Urinalysis: No results for input(s): COLORURINE, LABSPEC, PHURINE, GLUCOSEU, HGBUR, BILIRUBINUR, KETONESUR, PROTEINUR, UROBILINOGEN, NITRITE, LEUKOCYTESUR in the last 72 hours.  Invalid input(s): APPERANCEUR    Imaging: Dg Chest Port 1 View  07/17/2015  CLINICAL DATA:  Acute onset of cough and vomiting. Congestion. Possible aspiration. Initial encounter. EXAM: PORTABLE CHEST 1 VIEW COMPARISON:  Chest radiograph performed 06/03/2015 FINDINGS: The lungs are well-aerated. A small right pleural effusion is noted. Bibasilar airspace opacities may reflect mild interstitial edema. No pneumothorax is seen. The cardiomediastinal silhouette is mildly  enlarged. A right-sided dual-lumen catheter is noted ending about the mid to distal SVC. No acute osseous abnormalities are seen. IMPRESSION: Small right pleural effusion noted. Bibasilar airspace opacities may reflect mild interstitial edema. Mild cardiomegaly. Electronically Signed   By: Roanna RaiderJeffery  Chang M.D.   On: 07/17/2015 03:46     Medications:     . amLODipine  10 mg Oral QPM  . aspirin EC  81 mg Oral Daily  . carvedilol  12.5 mg Oral Q12H  . [START ON  07/18/2015] Chlorhexidine Gluconate Cloth  6 each Topical Q0600  . cloNIDine  0.1 mg Oral TID  . cyanocobalamin  500 mcg Oral Daily  . docusate sodium  100 mg Oral QODAY  . fentaNYL  25 mcg Transdermal Q72H  . fluticasone  2 spray Each Nare Daily  . furosemide  40 mg Oral Daily  . heparin  5,000 Units Subcutaneous 3 times per day  . hydrALAZINE  100 mg Oral TID  . latanoprost  1 drop Both Eyes QHS  . loratadine  10 mg Oral Daily  . mirtazapine  15 mg Oral QHS  . multivitamin-lutein  1 capsule Oral BID  . mupirocin ointment  1 application Nasal BID  . pantoprazole  40 mg Oral BID  . phenytoin  300 mg Oral QHS  . piperacillin-tazobactam (ZOSYN)  IV  3.375 g Intravenous Q12H  . predniSONE  10 mg Oral Daily  . ascorbic acid  500 mg Oral Daily   acetaminophen, albuterol, lactulose, morphine injection, nitroGLYCERIN, ondansetron **OR** ondansetron (ZOFRAN) IV, vancomycin  Assessment/ Plan:  81 y.o. female with past medical history of hypertension, obstructive sleep apnea, DJD, CVA with residual left sided weakness, carotid stenosis status post L CEA 2/15, seizure disorder, diastolic congestive heart failure, anemia  [facility] TTS [facility]  1. End Stage Renal Disease: Recently transitioned to PD, will start on relatively low fill volume of 1.5L, 3 exchanges, 8 hours total of dwell time.  2. Hypertension: at goal. Allergy to enalapril (causes anaphylaxis). - Continue amlodipine, coreg, hydralazine.   3. Secondary hyperparathyroidism. Will monitor bone mineral metabolism over cours eof hospitalization.   4. Anemia chronic kidney disease: hgb currently 12.2, hold off on epogen.   5.  Bacterial pneumonia:  Suspect aspiration, ? If some pulmonary edema as well, but this should be treated with PD. Continue zosyn at this time.    LOS: 0 Jakob Kimberlin 12/14/20164:21 PM

## 2015-07-17 NOTE — Progress Notes (Signed)
[facility] - [place] [facility]   PATIENT NAME: Kristina Wilson    MR#:  [id number]  DATE OF BIRTH:  [birth year]  SUBJECTIVE:  CHIEF COMPLAINT:   Chief Complaint  Patient presents with  . Shortness of Breath    In NH for 1 month- on HD- Pt not eating good for few days- adn told family- " I don't want to die in NH and want to go home- so family started on PD and took home 3 days ago." had episode of vomit and followed by severe SOB- brought here. Is drowsy- BP stable.have some PVCs on tele.  REVIEW OF SYSTEMS:  Pt is drowsy- not able to give details.  ROS  DRUG ALLERGIES:   Allergies  Allergen Reactions  . Enalapril Anaphylaxis  . Tramadol Shortness Of Breath  . Capsaicin Other (See Comments)    Reaction:  Unknown   . Codeine Other (See Comments)    Reaction:  Unknown   . Hydrocodone Other (See Comments)    Reaction:  Unknown   . Vicodin [Hydrocodone-Acetaminophen] Other (See Comments)    Reaction:  Unknown     VITALS:  Blood pressure 117/57, pulse 63, temperature 98.1 F (36.7 C), temperature source Oral, resp. rate 16, height  (1.676 m), weight 78.744 kg (173 lb 9.6 oz), SpO2 99 %.  PHYSICAL EXAMINATION:  GENERAL:  81 y.o.-year-old patient lying in the bed with no acute distress.  EYES: Pupils equal, round, reactive to light . No scleral icterus.  HEENT: Head atraumatic, normocephalic. Oropharynx and nasopharynx clear.  NECK:  Supple, no jugular venous distention. No thyroid enlargement, no tenderness.  LUNGS: Normal breath sounds bilaterally, no wheezing, some crepitation. No use of accessory muscles of respiration.  CARDIOVASCULAR: S1, S2 normal. No murmurs, rubs, or gallops.  ABDOMEN: Soft, nontender, nondistended. Bowel sounds present. No organomegaly or mass.  EXTREMITIES: mild pedal edema, cyanosis, or clubbing.  NEUROLOGIC: pt is drowsy, moves limbs spontaneously - does not follow commands.  PSYCHIATRIC: The patient is drowsy  and confused. SKIN: has pressure ulcer.  Physical Exam LABORATORY PANEL:   CBC  Recent Labs Lab 07/17/15 0351  WBC 7.1  HGB 12.2  HCT 38.5  PLT 252   ------------------------------------------------------------------------------------------------------------------  Chemistries   Recent Labs Lab 07/17/15 0351  NA 128*  K 3.0*  CL 92*  CO2 27  GLUCOSE 134*  BUN 23*  CREATININE 2.38*  CALCIUM 9.5   ------------------------------------------------------------------------------------------------------------------  Cardiac Enzymes  Recent Labs Lab 07/17/15 0351  TROPONINI <0.03   ------------------------------------------------------------------------------------------------------------------  RADIOLOGY:  Dg Chest Port 1 View  07/17/2015  CLINICAL DATA:  Acute onset of cough and vomiting. Congestion. Possible aspiration. Initial encounter. EXAM: PORTABLE CHEST 1 VIEW COMPARISON:  Chest radiograph performed 06/03/2015 FINDINGS: The lungs are well-aerated. A small right pleural effusion is noted. Bibasilar airspace opacities may reflect mild interstitial edema. No pneumothorax is seen. The cardiomediastinal silhouette is mildly enlarged. A right-sided dual-lumen catheter is noted ending about the mid to distal SVC. No acute osseous abnormalities are seen. IMPRESSION: Small right pleural effusion noted. Bibasilar airspace opacities may reflect mild interstitial edema. Mild cardiomegaly. Electronically Signed   By: Roanna Raider M.D.   On: 07/17/2015 03:46    ASSESSMENT AND PLAN:   Principal Problem:   Aspiration pneumonia (HCC) Active Problems:   CKD (chronic kidney disease), stage IV (HCC)   Chronic respiratory failure (HCC)   HTN (hypertension), benign  * aspiration pneumonia   IV vanc, zosyn   Swallow evaluation  Cultures sent.  * Ch respi failure   On oxygen, continue.   Uses Bipap at night,  * ESRD   Recently on PD- nephro consult.  * Htn   Hold  oral meds now- BP stable.  * CVA Hx   Bedbound- have total care.   Now worsening for few weeks- Called palliative care.  * Hx of seizure disorder   On dilantin   Hold oral meds.  All the records are reviewed and case discussed with Care Management/Social Workerr. Management plans discussed with the patient, family and they are in agreement.  CODE STATUS: DNR  TOTAL TIME TAKING CARE OF THIS PATIENT: 35 minutes.  Spoke ot her grand daughter on phone and informed her about poor condition, called palliative care.  POSSIBLE D/C IN 1-2 DAYS, DEPENDING ON CLINICAL CONDITION.   Altamese DillingVACHHANI, Yani Coventry M.D on 07/17/2015   Between 7am to 6pm - Pager - [phone]  After 6pm go to [email or web address] - password [email or web address]  Fabio Ne[facility] [place] [facility]  Office  [phone]  CC: Primary care physician; Jaclyn ShaggyATE,DENNY C, MD  Note: This dictation was prepared with Dragon dictation along with smaller phrase technology. Any transcriptional errors that result from this process are unintentional.

## 2015-07-18 DIAGNOSIS — J9610 Chronic respiratory failure, unspecified whether with hypoxia or hypercapnia: Secondary | ICD-10-CM

## 2015-07-18 DIAGNOSIS — J690 Pneumonitis due to inhalation of food and vomit: Principal | ICD-10-CM

## 2015-07-18 LAB — BASIC METABOLIC PANEL
ANION GAP: 7 (ref 5–15)
BUN: 26 mg/dL — ABNORMAL HIGH (ref 6–20)
CO2: 27 mmol/L (ref 22–32)
Calcium: 8.8 mg/dL — ABNORMAL LOW (ref 8.9–10.3)
Chloride: 91 mmol/L — ABNORMAL LOW (ref 101–111)
Creatinine, Ser: 2.62 mg/dL — ABNORMAL HIGH (ref 0.44–1.00)
GFR calc Af Amer: 17 mL/min — ABNORMAL LOW (ref >60)
GFR calc non Af Amer: 15 mL/min — ABNORMAL LOW (ref >60)
Glucose, Bld: 88 mg/dL (ref 65–99)
POTASSIUM: 2.7 mmol/L — AB (ref 3.5–5.1)
SODIUM: 125 mmol/L — AB (ref 135–145)

## 2015-07-18 LAB — CBC
HEMATOCRIT: 37.5 % (ref 35.0–47.0)
HEMOGLOBIN: 12 g/dL (ref 12.0–16.0)
MCH: 30.8 pg (ref 26.0–34.0)
MCHC: 32.1 g/dL (ref 32.0–36.0)
MCV: 95.9 fL (ref 80.0–100.0)
PLATELETS: 233 10*3/uL (ref 150–440)
RBC: 3.91 MIL/uL (ref 3.80–5.20)
RDW: 18.3 % — ABNORMAL HIGH (ref 11.5–14.5)
WBC: 9 10*3/uL (ref 3.6–11.0)

## 2015-07-18 LAB — MAGNESIUM: MAGNESIUM: 2.1 mg/dL (ref 1.7–2.4)

## 2015-07-18 LAB — GLUCOSE, CAPILLARY: GLUCOSE-CAPILLARY: 73 mg/dL (ref 65–99)

## 2015-07-18 LAB — HEPATITIS B SURFACE ANTIGEN: HEP B S AG: NEGATIVE

## 2015-07-18 MED ORDER — MORPHINE SULFATE (PF) 2 MG/ML IV SOLN
2.0000 mg | INTRAVENOUS | Status: DC | PRN
Start: 2015-07-18 — End: 2015-07-31
  Administered 2015-07-22 – 2015-07-31 (×8): 2 mg via INTRAVENOUS
  Filled 2015-07-18 (×4): qty 1
  Filled 2015-07-18: qty 2
  Filled 2015-07-18 (×2): qty 1
  Filled 2015-07-18: qty 2
  Filled 2015-07-18 (×2): qty 1

## 2015-07-18 MED ORDER — CETYLPYRIDINIUM CHLORIDE 0.05 % MT LIQD
7.0000 mL | Freq: Two times a day (BID) | OROMUCOSAL | Status: DC
  Administered 2015-07-18 – 2015-07-31 (×23): 7 mL via OROMUCOSAL

## 2015-07-18 MED ORDER — POTASSIUM CHLORIDE IN NACL 40-0.9 MEQ/L-% IV SOLN
INTRAVENOUS | Status: AC
  Administered 2015-07-18: 75 mL/h via INTRAVENOUS
  Filled 2015-07-18: qty 1000

## 2015-07-18 NOTE — Progress Notes (Signed)
Pt on bipap, hold po meds at this time.

## 2015-07-18 NOTE — Care Management (Signed)
Transferred to icu.  Patient had recent admit and transferred to skilled nursing and patient discharged home.  Patient transferred to icu due to the need for continuous bipap.  Palliative care consult is pending and there is a DNR in place. Patient is known to this CM.  From previous admissions, it had been planned for patient's family to be taught peritoneal dialysis but do not think this goal has been met.There may be indication to discuss comfort care

## 2015-07-18 NOTE — Progress Notes (Signed)
Daughter Tresa EndoKelly informed to come visit with pt d/t current health condition declining with her respiratory status. Marcella RN and RT currently in room with patient. Daughter stated," I will be there shortly."

## 2015-07-18 NOTE — Progress Notes (Signed)
ANTIBIOTIC CONSULT NOTE - INITIAL  Pharmacy Consult for Vancomycin/Zosyn Indication: pneumonia  Allergies  Allergen Reactions  . Enalapril Anaphylaxis  . Tramadol Shortness Of Breath  . Capsaicin Other (See Comments)    Reaction:  Unknown   . Codeine Other (See Comments)    Reaction:  Unknown   . Hydrocodone Other (See Comments)    Reaction:  Unknown   . Vicodin [Hydrocodone-Acetaminophen] Other (See Comments)    Reaction:  Unknown     Patient Measurements: Height:  (167.6 cm) Weight: 167 lb 8.8 oz (76 kg) IBW/kg (Calculated) : 59.3 Adjusted Body Weight: 67 kg  Vital Signs: Temp: 96.8 F (36 C) (12/15 0915) Temp Source: Axillary (12/15 0915) BP: 127/91 mmHg (12/15 0915) Pulse Rate: 93 (12/15 0915) Intake/Output from previous day: 12/14 0701 - 12/15 0700 In: 75 [IV Piggyback:75] Out: 0  Intake/Output from this shift:    Labs:  Recent Labs  07/17/15 0351 07/18/15 0657  WBC 7.1 9.0  HGB 12.2 12.0  PLT 252 233  CREATININE 2.38* 2.62*   Estimated Creatinine Clearance: 14.9 mL/min (by C-G formula based on Cr of 2.62). No results for input(s): VANCOTROUGH, VANCOPEAK, VANCORANDOM, GENTTROUGH, GENTPEAK, GENTRANDOM, TOBRATROUGH, TOBRAPEAK, TOBRARND, AMIKACINPEAK, AMIKACINTROU, AMIKACIN in the last 72 hours.   Microbiology: Recent Results (from the past 720 hour(s))  Blood culture (routine x 2)     Status: None (Preliminary result)   Collection Time: 07/17/15  4:02 AM  Result Value Ref Range Status   Specimen Description BLOOD RIGHT HAND  Final   Special Requests BOTTLES DRAWN AEROBIC AND ANAEROBIC  Final   Culture NO GROWTH 1 DAY  Final   Report Status PENDING  Incomplete  Blood culture (routine x 2)     Status: None (Preliminary result)   Collection Time: 07/17/15  4:02 AM  Result Value Ref Range Status   Specimen Description BLOOD LEFT ANTECUBITAL  Final   Special Requests BOTTLES DRAWN AEROBIC AND ANAEROBIC  Final   Culture  Setup Time   Final     GRAM POSITIVE COCCI IN CLUSTERS AEROBIC BOTTLE ONLY CRITICAL RESULT CALLED TO, READ BACK BY AND VERIFIED WITH: MARCELLA TURNER AT 2253 07/17/2015 BY [facility] CONFIRMED BY MLZ    Culture   Final    COAGULASE NEGATIVE STAPHYLOCOCCUS AEROBIC BOTTLE ONLY Results consistent with contamination.    Report Status PENDING  Incomplete  MRSA PCR Screening     Status: Abnormal   Collection Time: 07/17/15  6:51 AM  Result Value Ref Range Status   MRSA by PCR POSITIVE (A) NEGATIVE Final    Comment:        The GeneXpert MRSA Assay (FDA approved for NASAL specimens only), is one component of a comprehensive MRSA colonization surveillance program. It is not intended to diagnose MRSA infection nor to guide or monitor treatment for MRSA infections. CRITICAL RESULT CALLED TO, READ BACK BY AND VERIFIED WITH: Yehuda Savannah @ 07/16/16 by [facility]   Culture, sputum-assessment     Status: None   Collection Time: 07/17/15  5:44 PM  Result Value Ref Range Status   Specimen Description SPUTUM  Final   Special Requests NONE  Final   Sputum evaluation   Final    Sputum specimen not acceptable for testing.  Please recollect.   CALLED TO MARCELLA TURNER @ 2009 ON 07/17/2015 BY [facility]   Report Status 07/17/2015 FINAL  Final    Medical History: Past Medical History  Diagnosis Date  . Muscle weakness   .  Chronic respiratory failure (HCC)   . Pneumonia   . Sleep apnea   . CHF (congestive heart failure) (HCC)   . CKD (chronic kidney disease), stage IV (HCC)   . Dysphagia   . GERD (gastroesophageal reflux disease)   . Back pain   . Epilepsy (HCC)   . Hypertension   . Stroke (HCC)   . Headache   . Arthritis   . Anemia     iron deficiency anemia  . Complication of anesthesia     difficult waking up after surgery (2013)    Medications:  Scheduled:  . amLODipine  10 mg Oral QPM  . antiseptic oral rinse  7 mL Mouth Rinse BID  . aspirin EC  81 mg Oral Daily  . carvedilol  12.5 mg Oral Q12H  .  Chlorhexidine Gluconate Cloth  6 each Topical Q0600  . cloNIDine  0.1 mg Oral TID  . cyanocobalamin  500 mcg Oral Daily  . dialysis solution 1.5% low-MG/low-CA   Intraperitoneal Q24H  . docusate sodium  100 mg Oral QODAY  . fentaNYL  25 mcg Transdermal Q72H  . fluticasone  2 spray Each Nare Daily  . furosemide  40 mg Oral Daily  . gentamicin cream  1 application Topical Daily  . heparin  5,000 Units Subcutaneous 3 times per day  . hydrALAZINE  100 mg Oral TID  . latanoprost  1 drop Both Eyes QHS  . loratadine  10 mg Oral Daily  . mirtazapine  15 mg Oral QHS  . multivitamin-lutein  1 capsule Oral BID  . mupirocin ointment  1 application Nasal BID  . pantoprazole  40 mg Oral BID  . phenytoin  300 mg Oral QHS  . piperacillin-tazobactam (ZOSYN)  IV  3.375 g Intravenous Q12H  . predniSONE  10 mg Oral Daily  . ascorbic acid  500 mg Oral Daily   Assessment: Patient is a 81 yo female with orders for Vancomycin and Zosyn for empiric treatment of pneumonia.  Per notes, patient has ESRD requiring peritoneal dialysis.   Goal of Therapy:  Goal trough of 15 in PD patients  Plan:  Vancomycin 1000 mg given 12/14 which is an appropriate loading dose. Vancomycin 1000 mg iv ordered prn vanc trough less than 15 mcg/ml with trough scheduled on day 3 of therapy.     Will continue Zosyn 3.375 gm IV q12h based on est CrCl/ESRD.    Pharmacy will continue to follow.   Follow up culture results  Luisa Harthristy, Burnice Oestreicher D 07/18/2015,12:23 PM

## 2015-07-18 NOTE — Progress Notes (Signed)
Dr. Clint GuyHower and pharmacist notified of +BC with gram positive cocci in clusters in aerobic bottle.  Zosyn infusing and Vancomycin loaded (in ED). Windy Carinaurner,Ahmet Schank K, RN 12:06 AM 07/18/2015

## 2015-07-18 NOTE — Consult Note (Signed)
[facility] Consultation      Name: Kristina Wilson MRN: [id number] DOB: [birth year]    ADMISSION DATE:  07/17/2015   REFERRING MD :  Dr. Boyce Medici   CHIEF COMPLAINT:   Acute resp failure   HISTORY OF PRESENT ILLNESS  81 yo white female admitted to ICU for progressive resp failure and mental status changes Patient with possible sign sof aspiration pneumionia, had nausea and vomiting priot to admission, patient with ESRD on HD Patient placed on biPAP, obtunded, opens eyes to vocal stimuli, does NOT follow commands Patient   Patient    PAST MEDICAL HISTORY    :  Past Medical History  Diagnosis Date  . Muscle weakness   . Chronic respiratory failure ([place])   . Pneumonia   . Sleep apnea   . CHF (congestive heart failure) ([place])   . CKD (chronic kidney disease), stage IV ([place])   . Dysphagia   . GERD (gastroesophageal reflux disease)   . Back pain   . Epilepsy ([place])   . Hypertension   . Stroke ([place])   . Headache   . Arthritis   . Anemia     iron deficiency anemia  . Complication of anesthesia     difficult waking up after surgery (2013)   Past Surgical History  Procedure Laterality Date  . Back surgery    . Knee surgery    . Carotid endarterectomy    . Tonsillectomy    . Appendectomy    . Joint replacement Bilateral     Knee Replacements  . Dilation and curettage of uterus    . Breast surgery Right     Breast Biopsy  . Capd insertion N/A 04/12/2015    Procedure: LAPAROSCOPIC INSERTION CONTINUOUS AMBULATORY PERITONEAL DIALYSIS  (CAPD) CATHETER;  Surgeon: Katha Cabal, MD;  Location: [facility] ORS;  Service: Vascular;  Laterality: N/A;  . Peripheral vascular catheterization N/A 05/16/2015    Procedure: Dialysis/Perma Catheter Insertion;  Surgeon: Algernon Huxley, MD;  Location: [facility];  Service: Cardiovascular;  Laterality: N/A;   Prior to Admission medications   Medication Sig Start Date End Date Taking? Authorizing Provider    acetaminophen (TYLENOL) 325 MG tablet Take 650 mg by mouth 3 (three) times daily as needed for mild pain, fever or headache.    Yes Historical Provider, MD  amLODipine (NORVASC) 10 MG tablet Take 10 mg by mouth every evening.   Yes Historical Provider, MD  ascorbic acid (VITAMIN C) 500 MG tablet Take 500 mg by mouth daily.   Yes Historical Provider, MD  aspirin EC 81 MG tablet Take 81 mg by mouth daily.   Yes Historical Provider, MD  bimatoprost (LUMIGAN) 0.03 % ophthalmic solution Place 1 drop into both eyes 2 (two) times daily.   Yes Historical Provider, MD  carvedilol (COREG) 12.5 MG tablet Take 12.5 mg by mouth every 12 (twelve) hours.   Yes Historical Provider, MD  cetirizine (ZYRTEC) 10 MG tablet Take 10 mg by mouth at bedtime.   Yes Historical Provider, MD  cloNIDine (CATAPRES) 0.1 MG tablet Take 0.1 mg by mouth 3 (three) times daily.   Yes Historical Provider, MD  Coenzyme Q10 (COQ10) 50 MG CAPS Take 50 mg by mouth daily.   Yes Historical Provider, MD  cyanocobalamin 500 MCG tablet Take 500 mcg by mouth daily.   Yes Historical Provider, MD  docusate sodium (COLACE) 100 MG capsule Take 100 mg by mouth every other day.   Yes Historical  Provider, MD  fentaNYL (DURAGESIC - DOSED MCG/HR) 25 MCG/HR patch Place 25 mcg onto the skin every 3 (three) days.   Yes Historical Provider, MD  fluticasone (FLONASE) 50 MCG/ACT nasal spray Place 2 sprays into both nostrils daily.   Yes Historical Provider, MD  furosemide (LASIX) 40 MG tablet Take 40 mg by mouth daily.   Yes Historical Provider, MD  gentamicin ointment (GARAMYCIN) 0.1 % Apply 1 application topically 3 (three) times daily. Apply to left side port three times daily.   Yes Historical Provider, MD  hydrALAZINE (APRESOLINE) 100 MG tablet Take 100 mg by mouth 3 (three) times daily.   Yes Historical Provider, MD  lactulose (CHRONULAC) 10 GM/15ML solution Take 20 g by mouth daily as needed for mild constipation.   Yes Historical Provider, MD   lidocaine (LIDODERM) 5 % Place 1 patch onto the skin daily. Remove & Discard patch within 12 hours or as directed by MD   Yes Historical Provider, MD  mirtazapine (REMERON) 15 MG tablet Take 15 mg by mouth at bedtime.   Yes Historical Provider, MD  Multiple Vitamins-Minerals (PRESERVISION AREDS PO) Take 1 capsule by mouth 2 (two) times daily.   Yes Historical Provider, MD  nitroGLYCERIN (NITROSTAT) 0.4 MG SL tablet Place 0.4 mg under the tongue every 5 (five) minutes as needed for chest pain.   Yes Historical Provider, MD  oxycodone (OXY-IR) 5 MG capsule Take 5 mg by mouth every 4 (four) hours as needed for pain.   Yes Historical Provider, MD  pantoprazole (PROTONIX) 40 MG tablet Take 40 mg by mouth 2 (two) times daily.   Yes Historical Provider, MD  phenytoin (DILANTIN) 100 MG ER capsule Take 300 mg by mouth at bedtime.    Yes Historical Provider, MD  predniSONE (DELTASONE) 10 MG tablet Take 10 mg by mouth daily.   Yes Historical Provider, MD   Allergies  Allergen Reactions  . Enalapril Anaphylaxis  . Tramadol Shortness Of Breath  . Capsaicin Other (See Comments)    Reaction:  Unknown   . Codeine Other (See Comments)    Reaction:  Unknown   . Hydrocodone Other (See Comments)    Reaction:  Unknown   . Vicodin [Hydrocodone-Acetaminophen] Other (See Comments)    Reaction:  Unknown      FAMILY HISTORY   Family History  Problem Relation Age of Onset  . CAD Mother   . CAD Father   . Anemia Father   . CVA Sister   . Multiple myeloma Brother       SOCIAL HISTORY    reports that she quit smoking about 48 years ago. Her smoking use included Cigarettes. She has a 40 pack-year smoking history. She has never used smokeless tobacco. She reports that she does not drink alcohol or use illicit drugs.  Review of Systems  Unable to perform ROS: critical illness      VITAL SIGNS    Temp:  [96.8 F (36 C)-99 F (37.2 C)] 99 F (37.2 C) (12/15 1400) Pulse Rate:  [63-98] 97 (12/15  1400) Resp:  [16-20] 19 (12/15 1400) BP: (117-163)/(57-97) 157/63 mmHg (12/15 1400) SpO2:  [91 %-100 %] 100 % (12/15 1400) FiO2 (%):  [50 %] 50 % (12/15 0915) Weight:  [167 lb 8.8 oz (76 kg)-173 lb 4.5 oz (78.6 kg)] 167 lb 8.8 oz (76 kg) (12/15 0915) HEMODYNAMICS:   VENTILATOR SETTINGS: Vent Mode:  [-]  FiO2 (%):  [50 %] 50 % INTAKE / OUTPUT:  Intake/Output Summary (Last  24 hours) at 07/18/15 1528 Last data filed at 07/18/15 1400  Gross per 24 hour  Intake  277.5 ml  Output      0 ml  Net  277.5 ml       PHYSICAL EXAM   Physical Exam  Constitutional: She appears distressed.  HENT:  Head: Normocephalic and atraumatic.  Eyes: Pupils are equal, round, and reactive to light.  Neck: Normal range of motion. Neck supple.  Cardiovascular: Normal rate and regular rhythm.   No murmur heard. Pulmonary/Chest: She is in respiratory distress. She has no wheezes. She has rales.  resp distress  Abdominal: Soft. She exhibits no distension. There is no tenderness.  Musculoskeletal: She exhibits no edema.  Neurological: She displays normal reflexes. Coordination normal.  gcs<8  Skin: Skin is warm. She is diaphoretic.       LABS   LABS:  CBC  Recent Labs Lab 07/17/15 0351 07/18/15 0657  WBC 7.1 9.0  HGB 12.2 12.0  HCT 38.5 37.5  PLT 252 233   Coag's No results for input(s): APTT, INR in the last 168 hours. BMET  Recent Labs Lab 07/17/15 0351 07/18/15 0657  NA 128* 125*  K 3.0* 2.7*  CL 92* 91*  CO2 27 27  BUN 23* 26*  CREATININE 2.38* 2.62*  GLUCOSE 134* 88   Electrolytes  Recent Labs Lab 07/17/15 0351 07/18/15 0657  CALCIUM 9.5 8.8*  MG  --  2.1   Sepsis Markers No results for input(s): LATICACIDVEN, PROCALCITON, O2SATVEN in the last 168 hours. ABG No results for input(s): PHART, PCO2ART, PO2ART in the last 168 hours. Liver Enzymes No results for input(s): AST, ALT, ALKPHOS, BILITOT, ALBUMIN in the last 168 hours. Cardiac Enzymes  Recent  Labs Lab 07/17/15 0351  TROPONINI <0.03   Glucose  Recent Labs Lab 07/18/15 1142  GLUCAP 73     Recent Results (from the past 240 hour(s))  Blood culture (routine x 2)     Status: None (Preliminary result)   Collection Time: 07/17/15  4:02 AM  Result Value Ref Range Status   Specimen Description BLOOD RIGHT HAND  Final   Special Requests BOTTLES DRAWN AEROBIC AND ANAEROBIC 5ML  Final   Culture NO GROWTH 1 DAY  Final   Report Status PENDING  Incomplete  Blood culture (routine x 2)     Status: None (Preliminary result)   Collection Time: 07/17/15  4:02 AM  Result Value Ref Range Status   Specimen Description BLOOD LEFT ANTECUBITAL  Final   Special Requests BOTTLES DRAWN AEROBIC AND ANAEROBIC 15ML  Final   Culture  Setup Time   Final    GRAM POSITIVE COCCI IN CLUSTERS AEROBIC BOTTLE ONLY CRITICAL RESULT CALLED TO, READ BACK BY AND VERIFIED WITH: MARCELLA TURNER AT 2253 07/17/2015 BY TFK CONFIRMED BY MLZ    Culture   Final    COAGULASE NEGATIVE STAPHYLOCOCCUS AEROBIC BOTTLE ONLY Results consistent with contamination.    Report Status PENDING  Incomplete  MRSA PCR Screening     Status: Abnormal   Collection Time: 07/17/15  6:51 AM  Result Value Ref Range Status   MRSA by PCR POSITIVE (A) NEGATIVE Final    Comment:        The GeneXpert MRSA Assay (FDA approved for NASAL specimens only), is one component of a comprehensive MRSA colonization surveillance program. It is not intended to diagnose MRSA infection nor to guide or monitor treatment for MRSA infections. CRITICAL RESULT CALLED TO, READ BACK BY AND VERIFIED  WITH: Cecil Cobbs @ 07/16/16 by [facility]   Culture, sputum-assessment     Status: None   Collection Time: 07/17/15  5:44 PM  Result Value Ref Range Status   Specimen Description SPUTUM  Final   Special Requests NONE  Final   Sputum evaluation   Final    Sputum specimen not acceptable for testing.  Please recollect.   CALLED TO MARCELLA TURNER @ 2009 ON  07/17/2015 BY CA[facility]   Report Status 07/17/2015 FINAL  Final     Current facility-administered medications:  .  acetaminophen (TYLENOL) tablet 650 mg, 650 mg, Oral, TID PRN, Juluis Mire, MD .  albuterol (PROVENTIL) (2.5 MG/3ML) 0.083% nebulizer solution 2.5 mg, 2.5 mg, Nebulization, Q2H PRN, Juluis Mire, MD, 2.5 mg at 07/17/15 2105 .  amLODipine (NORVASC) tablet 10 mg, 10 mg, Oral, QPM, Juluis Mire, MD, 10 mg at 07/17/15 1724 .  antiseptic oral rinse (CPC / CETYLPYRIDINIUM CHLORIDE 0.05%) solution 7 mL, 7 mL, Mouth Rinse, BID, Vaughan Basta, MD, 7 mL at 07/18/15 1215 .  aspirin EC tablet 81 mg, 81 mg, Oral, Daily, Juluis Mire, MD, 81 mg at 07/17/15 1130 .  carvedilol (COREG) tablet 12.5 mg, 12.5 mg, Oral, Q12H, Juluis Mire, MD, Stopped at 07/18/15 1000 .  Chlorhexidine Gluconate Cloth 2 % PADS 6 each, 6 each, Topical, Q0600, Vaughan Basta, MD, 6 each at 07/18/15 0600 .  cloNIDine (CATAPRES) tablet 0.1 mg, 0.1 mg, Oral, TID, Juluis Mire, MD, Stopped at 07/18/15 1000 .  cyanocobalamin tablet 500 mcg, 500 mcg, Oral, Daily, Juluis Mire, MD, 500 mcg at 07/17/15 1131 .  dialysis solution 1.5% low-MG/low-CA dianeal solution, , Intraperitoneal, Q24H, Munsoor Lateef, MD .  docusate sodium (COLACE) capsule 100 mg, 100 mg, Oral, QODAY, Juluis Mire, MD, 100 mg at 07/17/15 1131 .  fentaNYL (DURAGESIC - dosed mcg/hr) 25 mcg, 25 mcg, Transdermal, Q72H, Juluis Mire, MD .  fluticasone (FLONASE) 50 MCG/ACT nasal spray 2 spray, 2 spray, Each Nare, Daily, Juluis Mire, MD, 2 spray at 07/18/15 1000 .  furosemide (LASIX) tablet 40 mg, 40 mg, Oral, Daily, Juluis Mire, MD, Stopped at 07/18/15 1000 .  gentamicin cream (GARAMYCIN) 0.1 % 1 application, 1 application, Topical, Daily, Munsoor Lateef, MD, 1 application at 67/34/19 1000 .  heparin injection 5,000 Units, 5,000 Units, Subcutaneous, 3 times per day, Juluis Mire, MD, 5,000 Units at 07/18/15  1403 .  hydrALAZINE (APRESOLINE) tablet 100 mg, 100 mg, Oral, TID, Juluis Mire, MD, Stopped at 07/18/15 1000 .  lactulose (CHRONULAC) 10 GM/15ML solution 20 g, 20 g, Oral, Daily PRN, Juluis Mire, MD .  latanoprost (XALATAN) 0.005 % ophthalmic solution 1 drop, 1 drop, Both Eyes, QHS, Juluis Mire, MD, 1 drop at 07/17/15 2243 .  loratadine (CLARITIN) tablet 10 mg, 10 mg, Oral, Daily, Juluis Mire, MD, 10 mg at 07/17/15 1132 .  mirtazapine (REMERON) tablet 15 mg, 15 mg, Oral, QHS, Juluis Mire, MD, 15 mg at 07/17/15 2200 .  morphine 2 MG/ML injection 2-4 mg, 2-4 mg, Intravenous, Q1H PRN, Flora Lipps, MD .  multivitamin-lutein (OCUVITE-LUTEIN) capsule 1 capsule, 1 capsule, Oral, BID, Juluis Mire, MD, 1 capsule at 07/17/15 1132 .  mupirocin ointment (BACTROBAN) 2 % 1 application, 1 application, Nasal, BID, Vaughan Basta, MD, 1 application at 37/90/24 1000 .  nitroGLYCERIN (NITROSTAT) SL tablet 0.4 mg, 0.4 mg, Sublingual, Q5 min PRN, Juluis Mire, MD .  ondansetron [facility]) tablet 4  mg, 4 mg, Oral, Q6H PRN **OR** ondansetron (ZOFRAN) injection 4 mg, 4 mg, Intravenous, Q6H PRN, Juluis Mire, MD .  pantoprazole (PROTONIX) EC tablet 40 mg, 40 mg, Oral, BID, Juluis Mire, MD, Stopped at 07/18/15 1000 .  phenytoin (DILANTIN) ER capsule 300 mg, 300 mg, Oral, QHS, Juluis Mire, MD, 300 mg at 07/17/15 2200 .  piperacillin-tazobactam (ZOSYN) IVPB 3.375 g, 3.375 g, Intravenous, Q12H, Crystal G Scarpena, RPH, 3.375 g at 07/18/15 1159 .  predniSONE (DELTASONE) tablet 10 mg, 10 mg, Oral, Daily, Juluis Mire, MD, Stopped at 07/18/15 1000 .  vancomycin (VANCOCIN) IVPB 1000 mg/200 mL premix, 1,000 mg, Intravenous, Once PRN, Loleta Dicker, RPH .  vitamin C (ASCORBIC ACID) tablet 500 mg, 500 mg, Oral, Daily, Juluis Mire, MD, 500 mg at 07/17/15 1134     INDWELLING DEVICES:  MICRO DATA: MRSA PCR  Urine  Blood Resp   ANTIMICROBIALS: vanc/zosyn  12/14    ASSESSMENT/PLAN   81 yo white female with progressive resp failure and progressive encephalopathy from acute resp failure From acute aspiration pneumonia in the setting of ESRD on HD  Patient is critically ill, prognosis is poor, follow up with palliative care team  PULMONARY Patient is DNR/DNI -bipap and oxygen as needed -wean as tolerated -neb therapy as needed -empiric abx therapy for asp pneumonia  CARDIOVASCULAR Not in shock at this time  RENAL ESRD on HD -follow up nephrology recs  GASTROINTESTINAL Keep NPO  HEMATOLOGIC Follow CBC  INFECTIOUS aspirtaion pneumonia -empiric abx   ENDOCRINE - ICU hypoglycemic\Hyperglycemia protocol   NEUROLOGIC -Encephalopathy from resp distress   I have personally obtained a history, examined the patient, evaluated laboratory and independently reviewed  imaging results, formulated the assessment and plan and placed orders.  CXR on 07/17/15 images reviewed 07/18/2015   The Patient requires high complexity decision making for assessment and support, frequent evaluation and titration of therapies, application of advanced monitoring technologies and extensive interpretation of multiple databases. Critical Care Time devoted to patient care services described in this note is 45 minutes.   Overall, patient is critically ill, prognosis is guarded. Patient at high risk for cardiac arrest and death.    Corrin Parker, M.D.  [facility]  Medical Director [place] Director [facility] Cardio-Pulmonary Department

## 2015-07-18 NOTE — Progress Notes (Signed)
[phone] Patient became gray/blue, apnic and unresponsive; pulse decelerated to 64, O2 sat dropped to 48; Repositioned patient, suctioned with yankar, O2 increased to 4Lpm via [place]; Respiratory and MD called; Pt. Responded to non-rebreather mask by RT, color returned and patient more responsive. Dr. Sheryle Hailiamond come up to see pt; [id number] Dr. Sheryle Hailiamond in to talk with daughter who arrived; Windy Carinaurner,Alisah Grandberry K, RN 07/18/2015 6:21 AM

## 2015-07-18 NOTE — Progress Notes (Signed)
Received order to transfer patient to CCU. Patient remains on continuous Bipap, patient is alert to self family at the bedside and are aware of transfer. Dr Esaw GrandchildVachanni at the bedside. Report given to Haynes BastJamie Janise Gora RN. Patient IV leaking and will need to be restarted. Nurse Katrinka BlazingSmith made aware of IV status.

## 2015-07-18 NOTE — Progress Notes (Signed)
Called by RN to see pt. Found pt to be grayish blue, very pale and unresponsive. Unable to palpate a pulse and SPO2 in the 50's. Placed pt on non rebreather and tried to stimulate pt with RN's at bedside. A few minutes later pt started getting color back in her face Spo2 gradually increased as did the heart rate. Pt Spo2 now 100% and HR is 100. Dr. Sheryle Hailiamond came to assess pt and is now in the room with the pt's family to update family on pt status.

## 2015-07-18 NOTE — Progress Notes (Signed)
[facility]  [place]   Subjective:  Patient with worsening respiratory status this a.m. She was placed on BiPAP. She is lethargic but arousable. She did have peritoneal dialysis overnight. Weight is currently down to 76 kg.  Objective:  Vital signs in last 24 hours:  Temp:  [96.8 F (36 C)-98.2 F (36.8 C)] 96.8 F (36 C) (12/15 0915) Pulse Rate:  [63-100] 93 (12/15 0915) Resp:  [16-18] 16 (12/14 2128) BP: (117-160)/(57-127) 127/91 mmHg (12/15 0915) SpO2:  [96 %-100 %] 100 % (12/15 0915) FiO2 (%):  [50 %] 50 % (12/15 0915) Weight:  [76 kg (167 lb 8.8 oz)-78.6 kg (173 lb 4.5 oz)] 76 kg (167 lb 8.8 oz) (12/15 0915)  Weight change: -3.048 kg (-6 lb 11.5 oz) Filed Weights   07/17/15 0646 07/18/15 0500 07/18/15 0915  Weight: 78.744 kg (173 lb 9.6 oz) 78.6 kg (173 lb 4.5 oz) 76 kg (167 lb 8.8 oz)    Intake/Output: I/O last 3 completed shifts: In: 19 [IV Piggyback:75] Out: 0    Intake/Output this shift:     Physical Exam: General: Critically ill appearing  Head: Normocephalic, atraumatic. Moist oral mucosal membranes  Eyes: Anicteric  Neck: Supple, trachea midline  Lungs:  Bilateral rhonchi, on bipap  Heart: S1S2 no rubs  Abdomen:  Soft, nontender, BS present   Extremities: 1+ peripheral edema.  Neurologic: Lethargic but arousable  Skin: No lesions  Access: PD catheter in place    Basic Metabolic Panel:  Recent Labs Lab 07/17/15 0351 07/18/15 0657  NA 128* 125*  K 3.0* 2.7*  CL 92* 91*  CO2 27 27  GLUCOSE 134* 88  BUN 23* 26*  CREATININE 2.38* 2.62*  CALCIUM 9.5 8.8*  MG  --  2.1    Liver Function Tests: No results for input(s): AST, ALT, ALKPHOS, BILITOT, PROT, ALBUMIN in the last 168 hours. No results for input(s): LIPASE, AMYLASE in the last 168 hours. No results for input(s): AMMONIA in the last 168 hours.  CBC:  Recent Labs Lab 07/17/15 0351 07/18/15 0657  WBC 7.1 9.0  HGB 12.2 12.0  HCT 38.5 37.5  MCV 95.9 95.9  PLT  252 233    Cardiac Enzymes:  Recent Labs Lab 07/17/15 0351  TROPONINI <0.03    BNP: Invalid input(s): POCBNP  CBG: No results for input(s): GLUCAP in the last 168 hours.  Microbiology: Results for orders placed or performed during the hospital encounter of 07/17/15  Blood culture (routine x 2)     Status: None (Preliminary result)   Collection Time: 07/17/15  4:02 AM  Result Value Ref Range Status   Specimen Description BLOOD RIGHT HAND  Final   Special Requests BOTTLES DRAWN AEROBIC AND ANAEROBIC  Final   Culture NO GROWTH 1 DAY  Final   Report Status PENDING  Incomplete  Blood culture (routine x 2)     Status: None (Preliminary result)   Collection Time: 07/17/15  4:02 AM  Result Value Ref Range Status   Specimen Description BLOOD LEFT ANTECUBITAL  Final   Special Requests BOTTLES DRAWN AEROBIC AND ANAEROBIC  Final   Culture  Setup Time   Final    GRAM POSITIVE COCCI IN CLUSTERS AEROBIC BOTTLE ONLY CRITICAL RESULT CALLED TO, READ BACK BY AND VERIFIED WITH: MARCELLA TURNER AT 2253 07/17/2015 BY TFK CONFIRMED BY MLZ    Culture   Final    COAGULASE NEGATIVE STAPHYLOCOCCUS AEROBIC BOTTLE ONLY Results consistent with contamination.    Report Status  PENDING  Incomplete  MRSA PCR Screening     Status: Abnormal   Collection Time: 07/17/15  6:51 AM  Result Value Ref Range Status   MRSA by PCR POSITIVE (A) NEGATIVE Final    Comment:        The GeneXpert MRSA Assay (FDA approved for NASAL specimens only), is one component of a comprehensive MRSA colonization surveillance program. It is not intended to diagnose MRSA infection nor to guide or monitor treatment for MRSA infections. CRITICAL RESULT CALLED TO, READ BACK BY AND VERIFIED WITH: Yehuda SavannahLauren Hobbs @ 07/16/16 by [facility]   Culture, sputum-assessment     Status: None   Collection Time: 07/17/15  5:44 PM  Result Value Ref Range Status   Specimen Description SPUTUM  Final   Special Requests NONE  Final    Sputum evaluation   Final    Sputum specimen not acceptable for testing.  Please recollect.   CALLED TO MARCELLA TURNER @ 2009 ON 07/17/2015 BY CAF   Report Status 07/17/2015 FINAL  Final    Coagulation Studies: No results for input(s): LABPROT, INR in the last 72 hours.  Urinalysis: No results for input(s): COLORURINE, LABSPEC, PHURINE, GLUCOSEU, HGBUR, BILIRUBINUR, KETONESUR, PROTEINUR, UROBILINOGEN, NITRITE, LEUKOCYTESUR in the last 72 hours.  Invalid input(s): APPERANCEUR    Imaging: Dg Chest Port 1 View  07/17/2015  CLINICAL DATA:  Acute onset of cough and vomiting. Congestion. Possible aspiration. Initial encounter. EXAM: PORTABLE CHEST 1 VIEW COMPARISON:  Chest radiograph performed 06/03/2015 FINDINGS: The lungs are well-aerated. A small right pleural effusion is noted. Bibasilar airspace opacities may reflect mild interstitial edema. No pneumothorax is seen. The cardiomediastinal silhouette is mildly enlarged. A right-sided dual-lumen catheter is noted ending about the mid to distal SVC. No acute osseous abnormalities are seen. IMPRESSION: Small right pleural effusion noted. Bibasilar airspace opacities may reflect mild interstitial edema. Mild cardiomegaly. Electronically Signed   By: Roanna RaiderJeffery  Chang M.D.   On: 07/17/2015 03:46     Medications:   . 0.9 % NaCl with KCl 40 mEq / L     . amLODipine  10 mg Oral QPM  . aspirin EC  81 mg Oral Daily  . carvedilol  12.5 mg Oral Q12H  . Chlorhexidine Gluconate Cloth  6 each Topical Q0600  . cloNIDine  0.1 mg Oral TID  . cyanocobalamin  500 mcg Oral Daily  . dialysis solution 1.5% low-MG/low-CA   Intraperitoneal Q24H  . docusate sodium  100 mg Oral QODAY  . fentaNYL  25 mcg Transdermal Q72H  . fluticasone  2 spray Each Nare Daily  . furosemide  40 mg Oral Daily  . gentamicin cream  1 application Topical Daily  . heparin  5,000 Units Subcutaneous 3 times per day  . hydrALAZINE  100 mg Oral TID  . latanoprost  1 drop Both Eyes QHS   . loratadine  10 mg Oral Daily  . mirtazapine  15 mg Oral QHS  . multivitamin-lutein  1 capsule Oral BID  . mupirocin ointment  1 application Nasal BID  . pantoprazole  40 mg Oral BID  . phenytoin  300 mg Oral QHS  . piperacillin-tazobactam (ZOSYN)  IV  3.375 g Intravenous Q12H  . predniSONE  10 mg Oral Daily  . ascorbic acid  500 mg Oral Daily   acetaminophen, albuterol, lactulose, morphine injection, nitroGLYCERIN, ondansetron **OR** ondansetron (ZOFRAN) IV, vancomycin  Assessment/ Plan:  81 y.o. female with past medical history of hypertension, obstructive sleep apnea, DJD, CVA with residual  left sided weakness, carotid stenosis status post L CEA 2/15, seizure disorder, diastolic congestive heart failure, anemia  [facility]  1. End Stage Renal Disease: Patient with decline in status. We're attempting to have the patient trained for peritoneal dialysis at home. However her respiratory status has worsened significantly. We will consider palliative care consult however defer this to pulmonary critical care.  Hold off on further peritoneal dialysis for now.  2. Hypertension: at goal. Allergy to enalapril (causes anaphylaxis). - Blood pressure currently acceptable at 127/91. Patient has labile blood pressure. Continue current antihypertensives.  3. Secondary hyperparathyroidism. If her condition improves we will consider monitoring bone mineral metabolism parameters  4. Anemia chronic kidney disease: No indication for Epogen at this time.   5.  Bacterial pneumonia/respiratory failure:  Suspect aspiration.  Currently on BiPAP. The patient remains on broad-spectrum anabolic therapy as well.   LOS: 1 Cambree Hendrix 12/15/201610:55 AM

## 2015-07-18 NOTE — Progress Notes (Signed)
Day shift Respiratory Therapist came to this RN and stated that patient is to be transferred to Step-down or come off Bipap according their "guidelines".  Dr. Elisabeth PigeonVachhani notified of "guidelines".  Dr. Elisabeth PigeonVachhani stated that he would talk with family to see if they want her to be transferred to step-down or come off Bipap. Windy Carinaurner,Kiersten Coss K, RN; 7:40 AM 07/18/2015

## 2015-07-18 NOTE — Progress Notes (Signed)
Visit made. Patient seen lying in bed, daughter Tresa EndoKelly and granddaughters April and Patience at bedside. Patient was moved to the ICU this morning for management of progressive respiratory failure, she is currently on Bipap. Family reports that the plan is for patient to "have the Bipap taken off" in the morning. Patient has not been responsive to family during their time with her today. She remains on IV antibiotics for treatment of aspiration pneumonia. Palliative care consult pending.  Will continue to follow through final disposition. Dayna BarkerKaren Robertson RN, BSN, [facility], hospital Liaison [phone] c

## 2015-07-18 NOTE — Progress Notes (Signed)
[facility] [place] [facility]   PATIENT NAME: Kristina GoltzJacquelyn Wilson    MR#:  [id number]  DATE OF BIRTH:  07/30/1923  SUBJECTIVE:  CHIEF COMPLAINT:   Chief Complaint  Patient presents with  . Shortness of Breath    In NH for 1 month- on HD- Pt not eating good for few days- and told family- " I don't want to die in NH and want to go home- so family started on PD and took home 3 days ago." had episode of vomit and followed by severe SOB- brought here. Is drowsy- BP stable.have some PVCs on tele. Over night- she had episode of respi distress and " turning blue" so started on NRBM and then on bipap- and that made her more alert. Blood culture also reported to be positive for gram positive cocci.  REVIEW OF SYSTEMS:  Pt is on bipap- not able to give details.  ROS  DRUG ALLERGIES:   Allergies  Allergen Reactions  . Enalapril Anaphylaxis  . Tramadol Shortness Of Breath  . Capsaicin Other (See Comments)    Reaction:  Unknown   . Codeine Other (See Comments)    Reaction:  Unknown   . Hydrocodone Other (See Comments)    Reaction:  Unknown   . Vicodin [Hydrocodone-Acetaminophen] Other (See Comments)    Reaction:  Unknown     VITALS:  Blood pressure 144/64, pulse 88, temperature 98.2 F (36.8 C), temperature source Oral, resp. rate 16, height 5\' 6"  (1.676 m), weight 78.6 kg (173 lb 4.5 oz), SpO2 99 %.  PHYSICAL EXAMINATION:  GENERAL:  81 y.o.-year-old patient lying in the bed with acute respiratory distress.  EYES: Pupils equal, round, reactive to light . No scleral icterus.  HEENT: Head atraumatic, normocephalic. Oropharynx and nasopharynx clear.  NECK:  Supple, no jugular venous distention. No thyroid enlargement, no tenderness.  LUNGS: equal breath sounds bilaterally, no wheezing, some crepitation. positive use of accessory muscles of respiration. On bipap- appears agitated. CARDIOVASCULAR: S1, S2 normal. No murmurs, rubs, or gallops.  ABDOMEN: Soft,  nontender, nondistended. Bowel sounds present. No organomegaly or mass.  EXTREMITIES: mild pedal edema, cyanosis, or clubbing.  NEUROLOGIC: pt is alert, moves limbs spontaneously - does not follow commands. Trying to reach to bipap mask- calm down by family at bedsite. PSYCHIATRIC: The patient is alert in distress. SKIN: has pressure ulcer.  Physical Exam LABORATORY PANEL:   CBC  Recent Labs Lab 07/18/15 0657  WBC 9.0  HGB 12.0  HCT 37.5  PLT 233   ------------------------------------------------------------------------------------------------------------------  Chemistries   Recent Labs Lab 07/18/15 0657  NA 125*  K 2.7*  CL 91*  CO2 27  GLUCOSE 88  BUN 26*  CREATININE 2.62*  CALCIUM 8.8*   ------------------------------------------------------------------------------------------------------------------  Cardiac Enzymes  Recent Labs Lab 07/17/15 0351  TROPONINI <0.03   ------------------------------------------------------------------------------------------------------------------  RADIOLOGY:  Dg Chest Port 1 View  07/17/2015  CLINICAL DATA:  Acute onset of cough and vomiting. Congestion. Possible aspiration. Initial encounter. EXAM: PORTABLE CHEST 1 VIEW COMPARISON:  Chest radiograph performed 06/03/2015 FINDINGS: The lungs are well-aerated. A small right pleural effusion is noted. Bibasilar airspace opacities may reflect mild interstitial edema. No pneumothorax is seen. The cardiomediastinal silhouette is mildly enlarged. A right-sided dual-lumen catheter is noted ending about the mid to distal SVC. No acute osseous abnormalities are seen. IMPRESSION: Small right pleural effusion noted. Bibasilar airspace opacities may reflect mild interstitial edema. Mild cardiomegaly. Electronically Signed   By: Roanna RaiderJeffery  Chang M.D.   On:  07/17/2015 03:46    ASSESSMENT AND PLAN:   Principal Problem:   Aspiration pneumonia (HCC) Active Problems:   CKD (chronic kidney  disease), stage IV (HCC)   Chronic respiratory failure (HCC)   HTN (hypertension), benign  * aspiration Vs HCAP pneumonia- causing ac on ch respi distress.   Associated sepsis - present on admission- with altered mental status and respi distress.   IV vanc, zosyn   Swallow evaluation   Cultures sent.   Started on bipap after event on 07/18/15 morning.   Dr. Sheryle Hail and then I had a discussion with her daughter and grand daughter in room- explained them about multiple complicating factors and extremely critical situation.    They want to continue bipap , but not on ventilator.   Will transfer to stepdown unit and get palliative care and pulmonary care consult.  * Bacteremia   Gr positive Cocci in blood culture.   Repeat bl cx today.    She is on vancomycin  * Ch respi failure   On oxygen, continue.   Uses Bipap at night,  * ESRD   Recently on PD- nephro consult.  * Htn   Hold oral meds now- BP stable.  * CVA Hx   Bedbound- have total care.   Now worsening for few weeks- Called palliative care.  * Hx of seizure disorder   On dilantin   Hold oral meds.  All the records are reviewed and case discussed with Care Management/Social Workerr. Management plans discussed with the patient, family and they are in agreement.  CODE STATUS: DNR  TOTAL TIME TAKING CARE OF THIS PATIENT: 45 critical care minutes.  Spoke ot her grand daughter and daughter in room- explained about extemely poor condition and critical situation. Talked to critical care specialist.  POSSIBLE D/C IN 2-3 DAYS, DEPENDING ON CLINICAL CONDITION.   Altamese Dilling M.D on 07/18/2015   Between 7am to 6pm - Pager - [phone]  After 6pm go to [email or web address] - password [email or web address]  [facility]  Office  [phone]  CC: Primary care physician; Jaclyn Shaggy, MD  Note: This dictation was prepared with Dragon dictation along with smaller phrase technology. Any transcriptional errors  that result from this process are unintentional.

## 2015-07-18 NOTE — Progress Notes (Signed)
Placed pt on Bipap per MD. Pt is tol okay at this time. Family at bedside.

## 2015-07-18 NOTE — Progress Notes (Signed)
PT Cancellation Note  Patient Details Name: Kristina RhodesJacquelyn R Zurawski MRN: [id number] DOB: [birth year]   Cancelled Treatment:    Reason Eval/Treat Not Completed: Medical issues which prohibited therapy (Pt transferred to CCU.  Per protocol, will discontinue PT order.  Please reconsult PT when pt is medically appropriate to participate in physical therapy.)   Hendricks Limesmily Kiaraliz Rafuse 07/18/2015, 2:09 PM Hendricks LimesEmily Cressida [place], PT [phone]

## 2015-07-18 NOTE — Progress Notes (Signed)
Pt has no iv access, unable to get iv access. Per instructions from dr Belia Hemankasa, no central line will be placed, can use HD double lumen permacath if needed.

## 2015-07-18 NOTE — Progress Notes (Signed)
Spoke with Kristina LothMarcella Turner, RN , patients nurse on nights.  She had spoke with Dr. Sheryle Hailiamond during the shift who stated patient was to be placed on continuous BIPAP and kept on this unit that the patient is currently on.

## 2015-07-19 ENCOUNTER — Inpatient Hospital Stay: Payer: Medicare Other

## 2015-07-19 DIAGNOSIS — J9611 Chronic respiratory failure with hypoxia: Secondary | ICD-10-CM

## 2015-07-19 DIAGNOSIS — Z992 Dependence on renal dialysis: Secondary | ICD-10-CM

## 2015-07-19 DIAGNOSIS — I509 Heart failure, unspecified: Secondary | ICD-10-CM

## 2015-07-19 DIAGNOSIS — Z515 Encounter for palliative care: Secondary | ICD-10-CM

## 2015-07-19 DIAGNOSIS — N184 Chronic kidney disease, stage 4 (severe): Secondary | ICD-10-CM

## 2015-07-19 DIAGNOSIS — N186 End stage renal disease: Secondary | ICD-10-CM

## 2015-07-19 LAB — COMPREHENSIVE METABOLIC PANEL
ALBUMIN: 1.9 g/dL — AB (ref 3.5–5.0)
ALK PHOS: 82 U/L (ref 38–126)
ALT: 12 U/L — AB (ref 14–54)
AST: 21 U/L (ref 15–41)
Anion gap: 8 (ref 5–15)
BILIRUBIN TOTAL: 0.8 mg/dL (ref 0.3–1.2)
BUN: 34 mg/dL — AB (ref 6–20)
CALCIUM: 8.4 mg/dL — AB (ref 8.9–10.3)
CO2: 26 mmol/L (ref 22–32)
CREATININE: 2.93 mg/dL — AB (ref 0.44–1.00)
Chloride: 97 mmol/L — ABNORMAL LOW (ref 101–111)
GFR calc Af Amer: 15 mL/min — ABNORMAL LOW (ref >60)
GFR, EST NON AFRICAN AMERICAN: 13 mL/min — AB (ref >60)
GLUCOSE: 64 mg/dL — AB (ref 65–99)
POTASSIUM: 3 mmol/L — AB (ref 3.5–5.1)
Sodium: 131 mmol/L — ABNORMAL LOW (ref 135–145)
TOTAL PROTEIN: 4.6 g/dL — AB (ref 6.5–8.1)

## 2015-07-19 LAB — BLOOD GAS, ARTERIAL
ACID-BASE EXCESS: 1.4 mmol/L (ref 0.0–3.0)
Allens test (pass/fail): POSITIVE — AB
BICARBONATE: 28.7 meq/L — AB (ref 21.0–28.0)
FIO2: 0.28
O2 SAT: 93.9 %
PCO2 ART: 57 mmHg — AB (ref 32.0–48.0)
PH ART: 7.31 — AB (ref 7.350–7.450)
PO2 ART: 77 mmHg — AB (ref 83.0–108.0)
Patient temperature: 37

## 2015-07-19 LAB — CBC
HEMATOCRIT: 34.6 % — AB (ref 35.0–47.0)
HEMOGLOBIN: 11.3 g/dL — AB (ref 12.0–16.0)
MCH: 31.5 pg (ref 26.0–34.0)
MCHC: 32.8 g/dL (ref 32.0–36.0)
MCV: 96.1 fL (ref 80.0–100.0)
Platelets: 206 10*3/uL (ref 150–440)
RBC: 3.6 MIL/uL — ABNORMAL LOW (ref 3.80–5.20)
RDW: 18.4 % — AB (ref 11.5–14.5)
WBC: 7.8 10*3/uL (ref 3.6–11.0)

## 2015-07-19 LAB — GLUCOSE, CAPILLARY
GLUCOSE-CAPILLARY: 125 mg/dL — AB (ref 65–99)
GLUCOSE-CAPILLARY: 57 mg/dL — AB (ref 65–99)
GLUCOSE-CAPILLARY: 74 mg/dL (ref 65–99)
Glucose-Capillary: 84 mg/dL (ref 65–99)
Glucose-Capillary: 67 mg/dL (ref 65–99)

## 2015-07-19 MED ORDER — DEXTROSE 50 % IV SOLN
INTRAVENOUS | Status: AC
  Administered 2015-07-19: 25 g via INTRAVENOUS
  Filled 2015-07-19: qty 50

## 2015-07-19 MED ORDER — DEXTROSE 50 % IV SOLN
25.0000 g | Freq: Once | INTRAVENOUS | Status: AC
  Administered 2015-07-19: 25 g via INTRAVENOUS

## 2015-07-19 MED ORDER — PANTOPRAZOLE SODIUM 40 MG IV SOLR
40.0000 mg | Freq: Two times a day (BID) | INTRAVENOUS | Status: DC
  Administered 2015-07-19 – 2015-07-25 (×13): 40 mg via INTRAVENOUS
  Filled 2015-07-19 (×13): qty 40

## 2015-07-19 MED ORDER — POTASSIUM CHLORIDE 10 MEQ/100ML IV SOLN
10.0000 meq | INTRAVENOUS | Status: AC
  Administered 2015-07-19 (×6): 10 meq via INTRAVENOUS
  Filled 2015-07-19 (×6): qty 100

## 2015-07-19 MED ORDER — DEXTROSE 5 % IV SOLN
INTRAVENOUS | Status: DC
  Administered 2015-07-19: 18:00:00 via INTRAVENOUS

## 2015-07-19 MED ORDER — SODIUM CHLORIDE 0.9 % IV SOLN
300.0000 mg | Freq: Every day | INTRAVENOUS | Status: DC
  Administered 2015-07-19 – 2015-07-21 (×3): 300 mg via INTRAVENOUS
  Filled 2015-07-19 (×6): qty 6

## 2015-07-19 NOTE — Progress Notes (Signed)
eLink Physician-Brief Progress Note Patient Name: Kristina RhodesJacquelyn R Raimondi DOB: 10/24/1922 MRN: [id number]   Date of Service  07/19/2015  HPI/Events of Note  Hypoglycemia - Blood glucose = 64. Patine is not eating well.   eICU Interventions  Will order: 1. Hypoglycemia orders. 2. D5W IV infusion at 40 mL/hour.     Intervention Category Intermediate Interventions: Other:  Lenell AntuSommer,Elonzo Sopp Eugene 07/19/2015, 5:48 PM

## 2015-07-19 NOTE — Progress Notes (Signed)
[facility]  ROUNDING NOTE   Subjective:  Patient remains critically ill this point in time. Remains on BiPAP at this time. Peritoneal dialysis was held last night.  Objective:  Vital signs in last 24 hours:  Temp:  [98.4 F (36.9 C)-99 F (37.2 C)] 98.4 F (36.9 C) (12/15 1930) Pulse Rate:  [79-98] 80 (12/15 2200) Resp:  [16-26] 26 (12/15 2200) BP: (70-163)/(38-97) 101/44 mmHg (12/15 2200) SpO2:  [91 %-100 %] 99 % (12/15 2200) Weight:  [74.9 kg (165 lb 2 oz)] 74.9 kg (165 lb 2 oz) (12/16 0500)  Weight change: -2.6 kg (-5 lb 11.7 oz) Filed Weights   07/18/15 0500 07/18/15 0915 07/19/15 0500  Weight: 78.6 kg (173 lb 4.5 oz) 76 kg (167 lb 8.8 oz) 74.9 kg (165 lb 2 oz)    Intake/Output: I/O last 3 completed shifts: In: 277.5 [I.V.:152.5; IV Piggyback:125] Out: 0    Intake/Output this shift:     Physical Exam: General: Critically ill appearing  Head: Normocephalic, atraumatic. Moist oral mucosal membranes  Eyes: Anicteric  Neck: Supple, trachea midline  Lungs:  Bilateral rhonchi, on bipap  Heart: S1S2 no rubs  Abdomen:  Soft, nontender, BS present   Extremities: 1+ peripheral edema.  Neurologic: Lethargic but arousable  Skin: No lesions  Access: PD catheter in place    Basic Metabolic Panel:  Recent Labs Lab 07/17/15 0351 07/18/15 0657 07/19/15 0433  NA 128* 125* 131*  K 3.0* 2.7* 3.0*  CL 92* 91* 97*  CO2 27 27 26   GLUCOSE 134* 88 64*  BUN 23* 26* 34*  CREATININE 2.38* 2.62* 2.93*  CALCIUM 9.5 8.8* 8.4*  MG  --  2.1  --     Liver Function Tests:  Recent Labs Lab 07/19/15 0433  AST 21  ALT 12*  ALKPHOS 82  BILITOT 0.8  PROT 4.6*  ALBUMIN 1.9*   No results for input(s): LIPASE, AMYLASE in the last 168 hours. No results for input(s): AMMONIA in the last 168 hours.  CBC:  Recent Labs Lab 07/17/15 0351 07/18/15 0657 07/19/15 0433  WBC 7.1 9.0 7.8  HGB 12.2 12.0 11.3*  HCT 38.5 37.5 34.6*  MCV 95.9 95.9 96.1  PLT 252 233  206    Cardiac Enzymes:  Recent Labs Lab 07/17/15 0351  TROPONINI <0.03    BNP: Invalid input(s): POCBNP  CBG:  Recent Labs Lab 07/18/15 1142 07/19/15 0746  GLUCAP 73 57*    Microbiology: Results for orders placed or performed during the hospital encounter of 07/17/15  Blood culture (routine x 2)     Status: None (Preliminary result)   Collection Time: 07/17/15  4:02 AM  Result Value Ref Range Status   Specimen Description BLOOD RIGHT HAND  Final   Special Requests BOTTLES DRAWN AEROBIC AND ANAEROBIC 5ML  Final   Culture NO GROWTH 1 DAY  Final   Report Status PENDING  Incomplete  Blood culture (routine x 2)     Status: None (Preliminary result)   Collection Time: 07/17/15  4:02 AM  Result Value Ref Range Status   Specimen Description BLOOD LEFT ANTECUBITAL  Final   Special Requests BOTTLES DRAWN AEROBIC AND ANAEROBIC 15ML  Final   Culture  Setup Time   Final    GRAM POSITIVE COCCI IN CLUSTERS AEROBIC BOTTLE ONLY CRITICAL RESULT CALLED TO, READ BACK BY AND VERIFIED WITH: MARCELLA TURNER AT 2253 07/17/2015 BY [facility] CONFIRMED BY [facility]    Culture   Final    COAGULASE NEGATIVE STAPHYLOCOCCUS  AEROBIC BOTTLE ONLY Results consistent with contamination.    Report Status PENDING  Incomplete  MRSA PCR Screening     Status: Abnormal   Collection Time: 07/17/15  6:51 AM  Result Value Ref Range Status   MRSA by PCR POSITIVE (A) NEGATIVE Final    Comment:        The GeneXpert MRSA Assay (FDA approved for NASAL specimens only), is one component of a comprehensive MRSA colonization surveillance program. It is not intended to diagnose MRSA infection nor to guide or monitor treatment for MRSA infections. CRITICAL RESULT CALLED TO, READ BACK BY AND VERIFIED WITH: Yehuda Savannah @ 07/16/16 by [facility]   Culture, sputum-assessment     Status: None   Collection Time: 07/17/15  5:44 PM  Result Value Ref Range Status   Specimen Description SPUTUM  Final   Special Requests NONE   Final   Sputum evaluation   Final    Sputum specimen not acceptable for testing.  Please recollect.   CALLED TO MARCELLA TURNER @ 2009 ON 07/17/2015 BY [facility]   Report Status 07/17/2015 FINAL  Final    Coagulation Studies: No results for input(s): LABPROT, INR in the last 72 hours.  Urinalysis: No results for input(s): COLORURINE, LABSPEC, PHURINE, GLUCOSEU, HGBUR, BILIRUBINUR, KETONESUR, PROTEINUR, UROBILINOGEN, NITRITE, LEUKOCYTESUR in the last 72 hours.  Invalid input(s): APPERANCEUR    Imaging: No results found.   Medications:     . amLODipine  10 mg Oral QPM  . antiseptic oral rinse  7 mL Mouth Rinse BID  . aspirin EC  81 mg Oral Daily  . carvedilol  12.5 mg Oral Q12H  . Chlorhexidine Gluconate Cloth  6 each Topical Q0600  . cloNIDine  0.1 mg Oral TID  . cyanocobalamin  500 mcg Oral Daily  . dialysis solution 1.5% low-MG/low-CA   Intraperitoneal Q24H  . docusate sodium  100 mg Oral QODAY  . fentaNYL  25 mcg Transdermal Q72H  . fluticasone  2 spray Each Nare Daily  . furosemide  40 mg Oral Daily  . gentamicin cream  1 application Topical Daily  . heparin  5,000 Units Subcutaneous 3 times per day  . hydrALAZINE  100 mg Oral TID  . latanoprost  1 drop Both Eyes QHS  . loratadine  10 mg Oral Daily  . mirtazapine  15 mg Oral QHS  . multivitamin-lutein  1 capsule Oral BID  . mupirocin ointment  1 application Nasal BID  . pantoprazole  40 mg Oral BID  . phenytoin  300 mg Oral QHS  . piperacillin-tazobactam (ZOSYN)  IV  3.375 g Intravenous Q12H  . predniSONE  10 mg Oral Daily  . ascorbic acid  500 mg Oral Daily   acetaminophen, albuterol, lactulose, morphine injection, nitroGLYCERIN, ondansetron **OR** ondansetron (ZOFRAN) IV, vancomycin  Assessment/ Plan:  81 y.o. female with past medical history of hypertension, obstructive sleep apnea, DJD, CVA with residual left sided weakness, carotid stenosis status post L CEA 2/15, seizure disorder, diastolic congestive heart  failure, anemia  [facility]  1. End Stage Renal Disease on PD: We held peritoneal dialysis yesterday. We are currently awaiting palliative care consult for clarifications of goals of care. For now we will continue to hold peritoneal dialysis. She has significant comorbidities which would make renal replacement therapy difficult.   2. Hypertension: at goal. Allergy to enalapril (causes anaphylaxis). - Blood pressure actually low at present at 101/44. Would hold antihypertensives at the moment.  3. Secondary hyperparathyroidism. If her  condition improves we will consider monitoring bone mineral metabolism parameters  4. Anemia chronic kidney disease: Hemoglobin 11.3. Hold Epogen for now.   5.  Bacterial pneumonia/respiratory failure:  Suspect aspiration.  -Remains on BiPAP at this point in time. Respiratory failure appears to be progressive. Currently awaiting further input from palliative care. Patient is DO NOT RESUSCITATE status.   LOS: 2 Tal Neer 12/16/20169:30 AM

## 2015-07-19 NOTE — Consult Note (Signed)
Palliative Medicine Inpatient Consult Note   Name: Kristina Wilson Date: 07/19/2015 MRN: [id number]  DOB: [birth year]  Referring Physician: Altamese Dilling, MD  Palliative Care consult requested for this 81 y.o. female for goals of medical therapy in patient with aspiration pneumonia and acute respiratory failure.    TODAY'S DISCUSSIONS AND DECISIONS: I had a long talk with pt's daughter.  She would like continued aggressive care if it is going to solve problems.  We discussed possible dysphagia and she at first indicated that she would want her mother to have a feeding tube --until I discussed the down side of that scenario.  She would want her to have more dialysis (whether PD or HD).  Daughter is very much a 'problem solver' and admitted to me that her mother is 'her main job now for 3 years'.  We talked about signs that her mother might be winding down and not wanting aggressive care --but at this point, reasonably aggressive care is desired and will continue.    IMPRESSION 1.  Acute on Chronic Hypoxic Resp Failure ---due to pneumonia that is worrisome for aspiration pneumonia ---on home O2 at 2.5 LPM 2.  ESRD (vs CKD stage IV) ?  Its a bit unclear at this time ---she has been on both PD (administered by daughter at home) and HD ---does not need HD today ---daughter would want it continued if needed 3.  HTN 4.  H/O CVA ---no residual but is bedbound and total care dependent despite no residual 5.  H/O Sz disorder on Dilantin 6.  Anemia --unclear etiology 7. DJD 8.  H/O carotic endarterectomy 9.  CHF ---EF in Oct 2016 was 60-65% 10.  OSA 11.  Dysphagia 12.  GERD 13.  Headaches 14.  Chronic nausea     REVIEW OF SYSTEMS:  Patient is not able to provide ROS  Due to respiratory illness  SPIRITUAL SUPPORT SYSTEM: Yes --family.  SOCIAL HISTORY:  reports that she quit smoking about 48 years ago. Her smoking use included Cigarettes. She has a 40 pack-year smoking  history. She has never used smokeless tobacco. She reports that she does not drink alcohol or use illicit drugs.  LEGAL DOCUMENTS:  DNR form is already in the paper chart.  Consent for Peritoneal Dialysis is in the paper chart (as signed by family and plan was for this to be performed by family )   CODE STATUS: DNR  PAST MEDICAL HISTORY: Past Medical History  Diagnosis Date  . Muscle weakness   . Chronic respiratory failure (HCC)   . Pneumonia   . Sleep apnea   . CHF (congestive heart failure) (HCC)   . CKD (chronic kidney disease), stage IV (HCC)   . Dysphagia   . GERD (gastroesophageal reflux disease)   . Back pain   . Epilepsy (HCC)   . Hypertension   . Stroke (HCC)   . Headache   . Arthritis   . Anemia     iron deficiency anemia  . Complication of anesthesia     difficult waking up after surgery (2013)    PAST SURGICAL HISTORY:  Past Surgical History  Procedure Laterality Date  . Back surgery    . Knee surgery    . Carotid endarterectomy    . Tonsillectomy    . Appendectomy    . Joint replacement Bilateral     Knee Replacements  . Dilation and curettage of uterus    . Breast surgery Right     Breast Biopsy  .  Capd insertion N/A 04/12/2015    Procedure: LAPAROSCOPIC INSERTION CONTINUOUS AMBULATORY PERITONEAL DIALYSIS  (CAPD) CATHETER;  Surgeon: [name], MD;  Location: [facility] ORS;  Service: Vascular;  Laterality: N/A;  . Peripheral vascular catheterization N/A 05/16/2015    Procedure: Dialysis/Perma Catheter Insertion;  Surgeon: Annice Needy, MD;  Location: [facility] INVASIVE CV LAB;  Service: Cardiovascular;  Laterality: N/A;    ALLERGIES:  is allergic to enalapril; tramadol; capsaicin; codeine; hydrocodone; and vicodin.  MEDICATIONS:  Current Facility-Administered Medications  Medication Dose Route Frequency Provider Last Rate Last Dose  . acetaminophen (TYLENOL) tablet 650 mg  650 mg Oral TID PRN Crissie Figures, MD      . albuterol (PROVENTIL) (2.5  MG/3ML) 0.083% nebulizer solution 2.5 mg  2.5 mg Nebulization Q2H PRN Crissie Figures, MD   2.5 mg at 07/17/15 2105  . amLODipine (NORVASC) tablet 10 mg  10 mg Oral QPM Crissie Figures, MD   Stopped at 07/18/15 1800  . antiseptic oral rinse (CPC / CETYLPYRIDINIUM CHLORIDE 0.05%) solution 7 mL  7 mL Mouth Rinse BID Altamese Dilling, MD   7 mL at 07/19/15 0912  . aspirin EC tablet 81 mg  81 mg Oral Daily Crissie Figures, MD   81 mg at 07/17/15 1130  . carvedilol (COREG) tablet 12.5 mg  12.5 mg Oral Q12H Crissie Figures, MD   Stopped at 07/18/15 1000  . Chlorhexidine Gluconate Cloth 2 % PADS 6 each  6 each Topical Q0600 Altamese Dilling, MD   6 each at 07/19/15 0545  . cloNIDine (CATAPRES) tablet 0.1 mg  0.1 mg Oral TID Crissie Figures, MD   Stopped at 07/18/15 1000  . cyanocobalamin tablet 500 mcg  500 mcg Oral Daily Crissie Figures, MD   500 mcg at 07/17/15 1131  . dialysis solution 1.5% low-MG/low-CA dianeal solution   Intraperitoneal Q24H Munsoor Lateef, MD      . docusate sodium (COLACE) capsule 100 mg  100 mg Oral QODAY Crissie Figures, MD   100 mg at 07/17/15 1131  . fentaNYL (DURAGESIC - dosed mcg/hr) 25 mcg  25 mcg Transdermal Q72H Crissie Figures, MD      . fluticasone [facility]) 50 MCG/ACT nasal spray 2 spray  2 spray Each Nare Daily Crissie Figures, MD   2 spray at 07/19/15 0914  . furosemide (LASIX) tablet 40 mg  40 mg Oral Daily Crissie Figures, MD   Stopped at 07/18/15 1000  . gentamicin cream (GARAMYCIN) 0.1 % 1 application  1 application Topical Daily Munsoor Lateef, MD   1 application at 07/19/15 1058  . heparin injection 5,000 Units  5,000 Units Subcutaneous 3 times per day Crissie Figures, MD   5,000 Units at 07/19/15 0545  . hydrALAZINE (APRESOLINE) tablet 100 mg  100 mg Oral TID Crissie Figures, MD   Stopped at 07/18/15 1000  . lactulose (CHRONULAC) 10 GM/15ML solution 20 g  20 g Oral Daily PRN Crissie Figures, MD      . latanoprost (XALATAN) 0.005 %  ophthalmic solution 1 drop  1 drop Both Eyes QHS Crissie Figures, MD   1 drop at 07/18/15 2127  . loratadine (CLARITIN) tablet 10 mg  10 mg Oral Daily Crissie Figures, MD   10 mg at 07/17/15 1132  . mirtazapine (REMERON) tablet 15 mg  15 mg Oral QHS Crissie Figures, MD   15 mg at 07/17/15 2200  . morphine 2 MG/ML injection 2-4  mg  2-4 mg Intravenous Q1H PRN Erin FullingKurian Kasa, MD      . multivitamin-lutein (OCUVITE-LUTEIN) capsule 1 capsule  1 capsule Oral BID Crissie FiguresEdavally N Reddy, MD   1 capsule at 07/17/15 1132  . mupirocin ointment (BACTROBAN) 2 % 1 application  1 application Nasal BID Altamese DillingVaibhavkumar Vachhani, MD   1 application at 07/19/15 0908  . nitroGLYCERIN (NITROSTAT) SL tablet 0.4 mg  0.4 mg Sublingual Q5 min PRN Crissie FiguresEdavally N Reddy, MD      . ondansetron [facility](ZOFRAN) tablet 4 mg  4 mg Oral Q6H PRN Crissie FiguresEdavally N Reddy, MD       Or  . ondansetron [facility](ZOFRAN) injection 4 mg  4 mg Intravenous Q6H PRN Crissie FiguresEdavally N Reddy, MD      . pantoprazole (PROTONIX) injection 40 mg  40 mg Intravenous Q12H Erin FullingKurian Kasa, MD      . phenytoin (DILANTIN) 300 mg in sodium chloride 0.9 % 100 mL IVPB  300 mg Intravenous QHS Erin FullingKurian Kasa, MD      . piperacillin-tazobactam (ZOSYN) IVPB 3.375 g  3.375 g Intravenous Q12H Crystal Hedwig MortonG Scarpena, RPH   3.375 g at 07/19/15 0904  . potassium chloride 10 mEq in 100 mL IVPB  10 mEq Intravenous Q1 Hr x 6 Erin FullingKurian Kasa, MD   10 mEq at 07/19/15 1421  . predniSONE (DELTASONE) tablet 10 mg  10 mg Oral Daily Crissie FiguresEdavally N Reddy, MD   Stopped at 07/18/15 1000  . vitamin C (ASCORBIC ACID) tablet 500 mg  500 mg Oral Daily Crissie FiguresEdavally N Reddy, MD   500 mg at 07/17/15 1134    Vital Signs: BP 92/56 mmHg  Pulse 83  Temp(Src) 98.1 F (36.7 C) (Axillary)  Resp 21  Ht 5\' 6"  (1.676 m)  Wt 74.9 kg (165 lb 2 oz)  BMI 26.66 kg/m2  SpO2 96% Filed Weights   07/18/15 0500 07/18/15 0915 07/19/15 0500  Weight: 78.6 kg (173 lb 4.5 oz) 76 kg (167 lb 8.8 oz) 74.9 kg (165 lb 2 oz)    Estimated body mass index is 26.66  kg/(m^2) as calculated from the following:   Height as of this encounter: 5\' 6"  (1.676 m).   Weight as of this encounter: 74.9 kg (165 lb 2 oz).  PERFORMANCE STATUS (ECOG) : 4 - Bedbound  PHYSICAL EXAM: Pt now lying in ICU bed while wearing nasal cannula Oxygen at 2 LPM NAD Hrt rrr no m Lungs decreased BS bases Skin w/o mottling or cyanosis   LABS: CBC:    Component Value Date/Time   WBC 7.8 07/19/2015 0433   WBC 9.5 11/21/2014 0446   HGB 11.3* 07/19/2015 0433   HGB 8.0* 11/21/2014 0446   HCT 34.6* 07/19/2015 0433   HCT 24.4* 11/21/2014 0446   PLT 206 07/19/2015 0433   PLT 259 11/21/2014 0446   MCV 96.1 07/19/2015 0433   MCV 98 11/21/2014 0446   NEUTROABS 3.5 06/06/2015 0347   NEUTROABS 7.0* 11/21/2014 0446   LYMPHSABS 1.4 06/06/2015 0347   LYMPHSABS 1.3 11/21/2014 0446   MONOABS 0.9 06/06/2015 0347   MONOABS 0.9 11/21/2014 0446   EOSABS 0.2 06/06/2015 0347   EOSABS 0.2 11/21/2014 0446   BASOSABS 0.1 06/06/2015 0347   BASOSABS 0.1 11/21/2014 0446   BASOSABS 1 05/01/2013 1017   Comprehensive Metabolic Panel:    Component Value Date/Time   NA 131* 07/19/2015 0433   NA 134* 11/21/2014 0446   K 3.0* 07/19/2015 0433   K 5.0 11/21/2014 0446   CL 97* 07/19/2015 0433  CL 106 11/21/2014 0446   CO2 26 07/19/2015 0433   CO2 25 11/21/2014 0446   BUN 34* 07/19/2015 0433   BUN 53* 11/21/2014 0446   CREATININE 2.93* 07/19/2015 0433   CREATININE 2.14* 11/21/2014 0446   GLUCOSE 64* 07/19/2015 0433   GLUCOSE 92 11/21/2014 0446   CALCIUM 8.4* 07/19/2015 0433   CALCIUM 9.0 11/21/2014 0446   AST 21 07/19/2015 0433   AST 20 11/19/2014 0926   ALT 12* 07/19/2015 0433   ALT 15 11/19/2014 0926   ALKPHOS 82 07/19/2015 0433   ALKPHOS 46 11/19/2014 0926   BILITOT 0.8 07/19/2015 0433   BILITOT 0.5 11/19/2014 0926   PROT 4.6* 07/19/2015 0433   PROT 6.2* 11/19/2014 0926   ALBUMIN 1.9* 07/19/2015 0433   ALBUMIN 3.0* 11/19/2014 0926    More than 50% of the visit was spent in  counseling/coordination of care: Yes  Time Spent: 80 minutes

## 2015-07-19 NOTE — Progress Notes (Signed)
Nutrition Follow-up   INTERVENTION:   Coordination of Care: await further determination regarding poc, futher nutritional recommendations to follow based on poc   NUTRITION DIAGNOSIS:   Inadequate oral intake related to inability to eat as evidenced by NPO status. Continues  GOAL:   Patient will meet greater than or equal to 90% of their needs  MONITOR:    (Energy Intake, Electrolyte and renal Profile, Anthropometrics)  REASON FOR ASSESSMENT:   Diagnosis, Malnutrition Screening Tool (Renal Diet)    ASSESSMENT:   Pt admitted with difficulty breathing with possible aspiration pna. Per H&P pt [facility] for the past month and a half receiving HD. Pt [facility] with the intent of using PD at home. Pt on isolation currently for MRSA. SLP following.  Pt is ICU, on Bipap, remains NPO, receiving PD, palliative care to further discuss poc with family, possibility of comfort care  Diet Order:  Diet NPO time specified  Skin:   (Stage III coccyx pressure ulcer)  Last BM:  unknown  Electrolyte and Renal Profile:  Recent Labs Lab 07/17/15 0351 07/18/15 0657 07/19/15 0433  BUN 23* 26* 34*  CREATININE 2.38* 2.62* 2.93*  NA 128* 125* 131*  K 3.0* 2.7* 3.0*  MG  --  2.1  --    Glucose Profile:  Recent Labs  07/18/15 1142 07/19/15 0746  GLUCAP 73 57*   Meds: reviewed  Height:   Ht Readings from Last 1 Encounters:  07/18/15 5\' 6"  (1.676 m)    Weight:   Wt Readings from Last 1 Encounters:  07/19/15 165 lb 2 oz (74.9 kg)   Filed Weights   07/18/15 0500 07/18/15 0915 07/19/15 0500  Weight: 173 lb 4.5 oz (78.6 kg) 167 lb 8.8 oz (76 kg) 165 lb 2 oz (74.9 kg)    BMI:  Body mass index is 26.66 kg/(m^2).  Estimated Nutritional Needs:   Kcal:  BEE: 1027kcals, TEE: (IF 1.2-1.4)(AF 1.2) 1478-1725kcals, using IBW of 59kg  Protein:  70-89g protein (1.2-1.5g/kg) using IBW of 59kg  Fluid:  UOP+1L  EDUCATION NEEDS:   No  education needs identified at this time  HIGH Care Level  Kristina Starcherate Kristina Lucy MS, RD, LDN [phone] Pager

## 2015-07-19 NOTE — Progress Notes (Signed)
[facility] [place] at [facility]   PATIENT NAME: Kristina Wilson    MR#:  [id number]  DATE OF BIRTH:  Dec 22, 1922  SUBJECTIVE:  CHIEF COMPLAINT:   Chief Complaint  Patient presents with  . Shortness of Breath    In NH for 1 month- on HD- Pt not eating good for few days- and told family- " I don't want to die in NH and want to go home- so family started on PD and took home 3 days ago." had episode of vomit and followed by severe SOB- brought here. Is drowsy- BP stable.have some PVCs on tele. Over night on 07/18/15- she had episode of respi distress and " turning blue" so started on NRBM and then on bipap- and that made her more alert. Transferred to stepdown unit with bipap,  Today able to take off bipap and more alert.   REVIEW OF SYSTEMS:  Pt is on bipap- not able to give details.  ROS  DRUG ALLERGIES:   Allergies  Allergen Reactions  . Enalapril Anaphylaxis  . Tramadol Shortness Of Breath  . Capsaicin Other (See Comments)    Reaction:  Unknown   . Codeine Other (See Comments)    Reaction:  Unknown   . Hydrocodone Other (See Comments)    Reaction:  Unknown   . Vicodin [Hydrocodone-Acetaminophen] Other (See Comments)    Reaction:  Unknown     VITALS:  Blood pressure 138/58, pulse 38, temperature 99.2 F (37.3 C), temperature source Oral, resp. rate 17, height 5\' 6"  (1.676 m), weight 74.9 kg (165 lb 2 oz), SpO2 97 %.  PHYSICAL EXAMINATION:  GENERAL:  81 y.o.-year-old patient lying in the bed with no distress.  EYES: Pupils equal, round, reactive to light . No scleral icterus.  HEENT: Head atraumatic, normocephalic. Oropharynx and nasopharynx clear.  NECK:  Supple, no jugular venous distention. No thyroid enlargement, no tenderness.  LUNGS: equal breath sounds bilaterally, mild wheezing, some crepitation. No use of accessory muscles of respiration. On nasal canula oxygen now. CARDIOVASCULAR: S1, S2 normal. No murmurs, rubs, or gallops.   ABDOMEN: Soft, nontender, nondistended. Bowel sounds present. No organomegaly or mass.  EXTREMITIES: mild pedal edema, no cyanosis, or clubbing.  NEUROLOGIC: pt is alert, moves limbs slowly , follows commands, appears to have generalized weakness. Trying to reach to bipap mask- calm down by family at bedsite. PSYCHIATRIC: The patient is alert and oriented. SKIN: has pressure ulcer.  Physical Exam LABORATORY PANEL:   CBC  Recent Labs Lab 07/19/15 0433  WBC 7.8  HGB 11.3*  HCT 34.6*  PLT 206   ------------------------------------------------------------------------------------------------------------------  Chemistries   Recent Labs Lab 07/18/15 0657 07/19/15 0433  NA 125* 131*  K 2.7* 3.0*  CL 91* 97*  CO2 27 26  GLUCOSE 88 64*  BUN 26* 34*  CREATININE 2.62* 2.93*  CALCIUM 8.8* 8.4*  MG 2.1  --   AST  --  21  ALT  --  12*  ALKPHOS  --  82  BILITOT  --  0.8   ------------------------------------------------------------------------------------------------------------------  Cardiac Enzymes  Recent Labs Lab 07/17/15 0351  TROPONINI <0.03   ------------------------------------------------------------------------------------------------------------------  RADIOLOGY:  Dg Chest Port 1 View  07/19/2015  CLINICAL DATA:  Pneumonia. EXAM: PORTABLE CHEST 1 VIEW COMPARISON:  July 17, 2015. FINDINGS: Stable cardiomegaly. Central pulmonary vascular congestion is again noted with bilateral perihilar and basilar edema. Probable mild left pleural effusion is noted. Right internal jugular dialysis catheter is unchanged in position. No pneumothorax is noted.  Dextroscoliosis of thoracic spine is noted with associated degenerative change. IMPRESSION: Cardiomegaly with central pulmonary vascular congestion and bilateral pulmonary edema. Electronically Signed   By: Lupita Raider, M.D.   On: 07/19/2015 10:23    ASSESSMENT AND PLAN:   Principal Problem:   Aspiration  pneumonia (HCC) Active Problems:   CKD (chronic kidney disease), stage IV (HCC)   Pressure ulcer   Chronic respiratory failure (HCC)   HTN (hypertension), benign  * aspiration Vs HCAP pneumonia- causing ac on ch respi distress.   Associated sepsis - present on admission- with altered mental status and respi distress.   IV vanc, zosyn   Swallow evaluation   Cultures sent.   Started on bipap after event on 07/18/15 morning.   Appreciated help by pulmonary- improved and off bipap on 07/19/15.   * Bacteremia   Suspected, but the blood culture reported to be contamination, and repeat culture remained negative.  * Ch respi failure   On oxygen, continue.   Uses Bipap at night,  * ESRD   Recently on PD- nephro consult.  * Htn   Hold oral meds now- BP stable.  * CVA Hx   Bedbound- have total care.   Now worsening for few weeks- Called palliative care.  * Hx of seizure disorder   On dilantin   Hold oral meds.  All the records are reviewed and case discussed with Care Management/Social Workerr. Management plans discussed with the patient, family and they are in agreement.  CODE STATUS: DNR  TOTAL TIME TAKING CARE OF THIS PATIENT: 35 minutes.  Appreciated Palliative care involvement.  POSSIBLE D/C IN 2-3 DAYS, DEPENDING ON CLINICAL CONDITION.   Altamese Dilling M.D on 07/19/2015   Between 7am to 6pm - Pager - [phone]  After 6pm go to [email or web address] - password [email or web address]  [facility]  Office  [phone]  CC: Primary care physician; Jaclyn Shaggy, MD  Note: This dictation was prepared with Dragon dictation along with smaller phrase technology. Any transcriptional errors that result from this process are unintentional.

## 2015-07-19 NOTE — Progress Notes (Signed)
Palliative Care Update  I had a long talk with patient's daughter.  She willl bring a copy of the legal guardianship form here tomorrow.  She would like Hospice in the Home IF pt does very, very poorly over the weekend.   But, she hopes for improvement and would even want a short term NG tube if needed while here.  We talked about PEGs (she brought this up) and I mentioned that this is discouraged in the very elderly as PEGs do not prevent aspiration of saliva --IF there is aspiration going on --which seems evident based on pts rattling breath sounds and trouble with secretions.    She may ALSO have a bacterial pneumonia, but she seems to me to have signs of a dysphagia.    See full note to follow.  Pt is followed by [facility].  I would recommend Hospice in the home at time of discharge.   However, daughter says she WOULD INDEED WANT HEMODIALYSIS or PERITONEAL DIALYSIS again if pt needs to have this.  Dr Cherylann RatelLateef is following.  Pts Cr is 2.93.    Will visit pt/ daughter again.  Pt to go to floor on nasal cannula O2.   Suan HalterMargaret F Jaki Hammerschmidt, MD

## 2015-07-19 NOTE — Care Management (Signed)
Palliative care is onsite for consult.  Remains on BIPAP at 35%.  To continue to attempt to wean further today.  ABGs pending

## 2015-07-19 NOTE — Progress Notes (Signed)
ANTIBIOTIC CONSULT NOTE - INITIAL  Pharmacy Consult for Zosyn Indication: pneumonia  Allergies  Allergen Reactions  . Enalapril Anaphylaxis  . Tramadol Shortness Of Breath  . Capsaicin Other (See Comments)    Reaction:  Unknown   . Codeine Other (See Comments)    Reaction:  Unknown   . Hydrocodone Other (See Comments)    Reaction:  Unknown   . Vicodin [Hydrocodone-Acetaminophen] Other (See Comments)    Reaction:  Unknown     Patient Measurements: Height:  (167.6 cm) Weight: 165 lb 2 oz (74.9 kg) IBW/kg (Calculated) : 59.3 Adjusted Body Weight: 67 kg  Vital Signs: BP: 92/56 mmHg (12/16 1000) Pulse Rate: 74 (12/16 1000) Intake/Output from previous day: 12/15 0701 - 12/16 0700 In: 252.5 [I.V.:152.5; IV Piggyback:100] Out: 0  Intake/Output from this shift: Total I/O In: 50 [IV Piggyback:50] Out: -   Labs:  Recent Labs  07/17/15 0351 07/18/15 0657 07/19/15 0433  WBC 7.1 9.0 7.8  HGB 12.2 12.0 11.3*  PLT 252 233 206  CREATININE 2.38* 2.62* 2.93*   Estimated Creatinine Clearance: 13.2 mL/min (by C-G formula based on Cr of 2.93). No results for input(s): VANCOTROUGH, VANCOPEAK, VANCORANDOM, GENTTROUGH, GENTPEAK, GENTRANDOM, TOBRATROUGH, TOBRAPEAK, TOBRARND, AMIKACINPEAK, AMIKACINTROU, AMIKACIN in the last 72 hours.   Microbiology: Recent Results (from the past 720 hour(s))  Blood culture (routine x 2)     Status: None (Preliminary result)   Collection Time: 07/17/15  4:02 AM  Result Value Ref Range Status   Specimen Description BLOOD RIGHT HAND  Final   Special Requests BOTTLES DRAWN AEROBIC AND ANAEROBIC  Final   Culture NO GROWTH 1 DAY  Final   Report Status PENDING  Incomplete  Blood culture (routine x 2)     Status: None (Preliminary result)   Collection Time: 07/17/15  4:02 AM  Result Value Ref Range Status   Specimen Description BLOOD LEFT ANTECUBITAL  Final   Special Requests BOTTLES DRAWN AEROBIC AND ANAEROBIC  Final   Culture  Setup  Time   Final    GRAM POSITIVE COCCI IN CLUSTERS AEROBIC BOTTLE ONLY CRITICAL RESULT CALLED TO, READ BACK BY AND VERIFIED WITH: MARCELLA TURNER AT 2253 07/17/2015 BY TFK CONFIRMED BY MLZ    Culture   Final    COAGULASE NEGATIVE STAPHYLOCOCCUS AEROBIC BOTTLE ONLY Results consistent with contamination.    Report Status PENDING  Incomplete  MRSA PCR Screening     Status: Abnormal   Collection Time: 07/17/15  6:51 AM  Result Value Ref Range Status   MRSA by PCR POSITIVE (A) NEGATIVE Final    Comment:        The GeneXpert MRSA Assay (FDA approved for NASAL specimens only), is one component of a comprehensive MRSA colonization surveillance program. It is not intended to diagnose MRSA infection nor to guide or monitor treatment for MRSA infections. CRITICAL RESULT CALLED TO, READ BACK BY AND VERIFIED WITH: Yehuda Savannah @ 07/16/16 by [facility]   Culture, sputum-assessment     Status: None   Collection Time: 07/17/15  5:44 PM  Result Value Ref Range Status   Specimen Description SPUTUM  Final   Special Requests NONE  Final   Sputum evaluation   Final    Sputum specimen not acceptable for testing.  Please recollect.   CALLED TO MARCELLA TURNER @ 2009 ON 07/17/2015 BY CAF   Report Status 07/17/2015 FINAL  Final    Medical History: Past Medical History  Diagnosis Date  . Muscle weakness   .  Chronic respiratory failure (HCC)   . Pneumonia   . Sleep apnea   . CHF (congestive heart failure) (HCC)   . CKD (chronic kidney disease), stage IV (HCC)   . Dysphagia   . GERD (gastroesophageal reflux disease)   . Back pain   . Epilepsy (HCC)   . Hypertension   . Stroke (HCC)   . Headache   . Arthritis   . Anemia     iron deficiency anemia  . Complication of anesthesia     difficult waking up after surgery (2013)    Medications:  Scheduled:  . amLODipine  10 mg Oral QPM  . antiseptic oral rinse  7 mL Mouth Rinse BID  . aspirin EC  81 mg Oral Daily  . carvedilol  12.5 mg Oral Q12H   . Chlorhexidine Gluconate Cloth  6 each Topical Q0600  . cloNIDine  0.1 mg Oral TID  . cyanocobalamin  500 mcg Oral Daily  . dialysis solution 1.5% low-MG/low-CA   Intraperitoneal Q24H  . docusate sodium  100 mg Oral QODAY  . fentaNYL  25 mcg Transdermal Q72H  . fluticasone  2 spray Each Nare Daily  . furosemide  40 mg Oral Daily  . gentamicin cream  1 application Topical Daily  . heparin  5,000 Units Subcutaneous 3 times per day  . hydrALAZINE  100 mg Oral TID  . latanoprost  1 drop Both Eyes QHS  . loratadine  10 mg Oral Daily  . mirtazapine  15 mg Oral QHS  . multivitamin-lutein  1 capsule Oral BID  . mupirocin ointment  1 application Nasal BID  . pantoprazole (PROTONIX) IV  40 mg Intravenous Q12H  . phenytoin (DILANTIN) IV  300 mg Intravenous QHS  . piperacillin-tazobactam (ZOSYN)  IV  3.375 g Intravenous Q12H  . potassium chloride  10 mEq Intravenous Q1 Hr x 6  . predniSONE  10 mg Oral Daily  . ascorbic acid  500 mg Oral Daily   Assessment: Patient is a 81 yo female with orders for Vancomycin and Zosyn for empiric treatment of pneumonia.  Per notes, patient has ESRD requiring peritoneal dialysis. Vancomycin d/c 12/16.   Plan:  Will continue Zosyn 3.375 gm IV q12h based on est CrCl/ESRD.    Pharmacy will continue to follow.   Follow up culture results  Luisa Harthristy, Jamael Hoffmann D 07/19/2015,11:53 AM

## 2015-07-19 NOTE — Progress Notes (Signed)
Palliative Care follow-up on this patient. Patient continues with declining respiratory status. Patient laying in bed with bi-pap in place, was able to nod yes to questions while nurse providing mouth care. No family present at time of this visit. Discussed with Dr. Orvan Falconerampbell who agrees to contact family to discuss disease progression and goals of care (specifically comfort care). Will continue to follow and update care team.  [place] ShiEmily Howell, MSW, LCSW Palliative Care social worker [phone] (c)

## 2015-07-19 NOTE — Consult Note (Signed)
[facility] Consultation      Name: Kristina Wilson MRN: [id number] DOB: [birth year]    ADMISSION DATE:  07/17/2015   REFERRING MD :  Dr. Desiree Hane   CHIEF COMPLAINT:   Acute resp failure   HISTORY OF PRESENT ILLNESS  Remains on biPAP, prognosis is poor Patient placed on biPAP, obtunded, opens eyes to vocal stimuli, does NOT follow commands Follow  Up palliative care recs Patient foll Follow up  Patient Review of Systems  Unable to perform ROS: critical illness      VITAL SIGNS    Temp:  [96.8 F (36 C)-99 F (37.2 C)] 98.4 F (36.9 C) (12/15 1930) Pulse Rate:  [79-98] 80 (12/15 2200) Resp:  [16-26] 26 (12/15 2200) BP: (70-163)/(38-97) 101/44 mmHg (12/15 2200) SpO2:  [91 %-100 %] 99 % (12/15 2200) FiO2 (%):  [50 %] 50 % (12/15 0915) Weight:  [165 lb 2 oz (74.9 kg)-167 lb 8.8 oz (76 kg)] 165 lb 2 oz (74.9 kg) (12/16 0500) HEMODYNAMICS:   VENTILATOR SETTINGS: Vent Mode:  [-]  FiO2 (%):  [50 %] 50 % INTAKE / OUTPUT:  Intake/Output Summary (Last 24 hours) at 07/19/15 4098 Last data filed at 07/18/15 1400  Gross per 24 hour  Intake  202.5 ml  Output      0 ml  Net  202.5 ml       PHYSICAL EXAM   Physical Exam  Constitutional: She appears distressed.  HENT:  Head: Normocephalic and atraumatic.  Eyes: Pupils are equal, round, and reactive to light.  Neck: Normal range of motion. Neck supple.  Cardiovascular: Normal rate and regular rhythm.   No murmur heard. Pulmonary/Chest: She is in respiratory distress. She has no wheezes. She has rales.  resp distress  Abdominal: Soft. She exhibits no distension. There is no tenderness.  Musculoskeletal: She exhibits no edema.  Neurological: She displays normal reflexes. Coordination normal.  gcs<8  Skin: Skin is warm. She is diaphoretic.       LABS   LABS:  CBC  Recent Labs Lab 07/17/15 0351 07/18/15 0657 07/19/15 0433  WBC 7.1 9.0 7.8  HGB 12.2 12.0 11.3*  HCT 38.5  37.5 34.6*  PLT 252 233 206   Coag's No results for input(s): APTT, INR in the last 168 hours. BMET  Recent Labs Lab 07/17/15 0351 07/18/15 0657 07/19/15 0433  NA 128* 125* 131*  K 3.0* 2.7* 3.0*  CL 92* 91* 97*  CO2 27 27 26   BUN 23* 26* 34*  CREATININE 2.38* 2.62* 2.93*  GLUCOSE 134* 88 64*   Electrolytes  Recent Labs Lab 07/17/15 0351 07/18/15 0657 07/19/15 0433  CALCIUM 9.5 8.8* 8.4*  MG  --  2.1  --    Sepsis Markers No results for input(s): LATICACIDVEN, PROCALCITON, O2SATVEN in the last 168 hours. ABG No results for input(s): PHART, PCO2ART, PO2ART in the last 168 hours. Liver Enzymes  Recent Labs Lab 07/19/15 0433  AST 21  ALT 12*  ALKPHOS 82  BILITOT 0.8  ALBUMIN 1.9*   Cardiac Enzymes  Recent Labs Lab 07/17/15 0351  TROPONINI <0.03   Glucose  Recent Labs Lab 07/18/15 1142 07/19/15 0746  GLUCAP 73 57*     Recent Results (from the past 240 hour(s))  Blood culture (routine x 2)     Status: None (Preliminary result)   Collection Time: 07/17/15  4:02 AM  Result Value Ref Range Status   Specimen Description BLOOD RIGHT HAND  Final   Special Requests  BOTTLES DRAWN AEROBIC AND ANAEROBIC  Final   Culture NO GROWTH 1 DAY  Final   Report Status PENDING  Incomplete  Blood culture (routine x 2)     Status: None (Preliminary result)   Collection Time: 07/17/15  4:02 AM  Result Value Ref Range Status   Specimen Description BLOOD LEFT ANTECUBITAL  Final   Special Requests BOTTLES DRAWN AEROBIC AND ANAEROBIC  Final   Culture  Setup Time   Final    GRAM POSITIVE COCCI IN CLUSTERS AEROBIC BOTTLE ONLY CRITICAL RESULT CALLED TO, READ BACK BY AND VERIFIED WITH: MARCELLA TURNER AT 2253 07/17/2015 BY TFK CONFIRMED BY MLZ    Culture   Final    COAGULASE NEGATIVE STAPHYLOCOCCUS AEROBIC BOTTLE ONLY Results consistent with contamination.    Report Status PENDING  Incomplete  MRSA PCR Screening     Status: Abnormal   Collection Time:  07/17/15  6:51 AM  Result Value Ref Range Status   MRSA by PCR POSITIVE (A) NEGATIVE Final    Comment:        The GeneXpert MRSA Assay (FDA approved for NASAL specimens only), is one component of a comprehensive MRSA colonization surveillance program. It is not intended to diagnose MRSA infection nor to guide or monitor treatment for MRSA infections. CRITICAL RESULT CALLED TO, READ BACK BY AND VERIFIED WITH: Yehuda Savannah @ 07/16/16 by [facility]   Culture, sputum-assessment     Status: None   Collection Time: 07/17/15  5:44 PM  Result Value Ref Range Status   Specimen Description SPUTUM  Final   Special Requests NONE  Final   Sputum evaluation   Final    Sputum specimen not acceptable for testing.  Please recollect.   CALLED TO MARCELLA TURNER @ 2009 ON 07/17/2015 BY [facility]   Report Status 07/17/2015 FINAL  Final     Current facility-administered medications:  .  acetaminophen (TYLENOL) tablet 650 mg, 650 mg, Oral, TID PRN, Crissie Figures, MD .  albuterol (PROVENTIL) (2.5 MG/3ML) 0.083% nebulizer solution 2.5 mg, 2.5 mg, Nebulization, Q2H PRN, Crissie Figures, MD, 2.5 mg at 07/17/15 2105 .  amLODipine (NORVASC) tablet 10 mg, 10 mg, Oral, QPM, Crissie Figures, MD, Stopped at 07/18/15 1800 .  antiseptic oral rinse (CPC / CETYLPYRIDINIUM CHLORIDE 0.05%) solution 7 mL, 7 mL, Mouth Rinse, BID, Altamese Dilling, MD, 7 mL at 07/18/15 2133 .  aspirin EC tablet 81 mg, 81 mg, Oral, Daily, Crissie Figures, MD, 81 mg at 07/17/15 1130 .  carvedilol (COREG) tablet 12.5 mg, 12.5 mg, Oral, Q12H, Crissie Figures, MD, Stopped at 07/18/15 1000 .  Chlorhexidine Gluconate Cloth 2 % PADS 6 each, 6 each, Topical, Q0600, Altamese Dilling, MD, 6 each at 07/19/15 0545 .  cloNIDine (CATAPRES) tablet 0.1 mg, 0.1 mg, Oral, TID, Crissie Figures, MD, Stopped at 07/18/15 1000 .  cyanocobalamin tablet 500 mcg, 500 mcg, Oral, Daily, Crissie Figures, MD, 500 mcg at 07/17/15 1131 .  dialysis solution 1.5%  low-MG/low-CA dianeal solution, , Intraperitoneal, Q24H, Munsoor Lateef, MD .  docusate sodium (COLACE) capsule 100 mg, 100 mg, Oral, QODAY, Crissie Figures, MD, 100 mg at 07/17/15 1131 .  fentaNYL (DURAGESIC - dosed mcg/hr) 25 mcg, 25 mcg, Transdermal, Q72H, Crissie Figures, MD .  fluticasone [facility]) 50 MCG/ACT nasal spray 2 spray, 2 spray, Each Nare, Daily, Crissie Figures, MD, 2 spray at 07/18/15 1000 .  furosemide (LASIX) tablet 40 mg, 40 mg, Oral, Daily, Crissie Figures,  MD, Stopped at 07/18/15 1000 .  gentamicin cream (GARAMYCIN) 0.1 % 1 application, 1 application, Topical, Daily, Munsoor Lateef, MD, 1 application at 07/18/15 1000 .  heparin injection 5,000 Units, 5,000 Units, Subcutaneous, 3 times per day, Crissie FiguresEdavally N Reddy, MD, 5,000 Units at 07/19/15 0545 .  hydrALAZINE (APRESOLINE) tablet 100 mg, 100 mg, Oral, TID, Crissie FiguresEdavally N Reddy, MD, Stopped at 07/18/15 1000 .  lactulose (CHRONULAC) 10 GM/15ML solution 20 g, 20 g, Oral, Daily PRN, Crissie FiguresEdavally N Reddy, MD .  latanoprost (XALATAN) 0.005 % ophthalmic solution 1 drop, 1 drop, Both Eyes, QHS, Crissie FiguresEdavally N Reddy, MD, 1 drop at 07/18/15 2127 .  loratadine (CLARITIN) tablet 10 mg, 10 mg, Oral, Daily, Crissie FiguresEdavally N Reddy, MD, 10 mg at 07/17/15 1132 .  mirtazapine (REMERON) tablet 15 mg, 15 mg, Oral, QHS, Crissie FiguresEdavally N Reddy, MD, 15 mg at 07/17/15 2200 .  morphine 2 MG/ML injection 2-4 mg, 2-4 mg, Intravenous, Q1H PRN, Erin FullingKurian Sameeha Rockefeller, MD .  multivitamin-lutein (OCUVITE-LUTEIN) capsule 1 capsule, 1 capsule, Oral, BID, Crissie FiguresEdavally N Reddy, MD, 1 capsule at 07/17/15 1132 .  mupirocin ointment (BACTROBAN) 2 % 1 application, 1 application, Nasal, BID, Altamese DillingVaibhavkumar Vachhani, MD, 1 application at 07/18/15 2126 .  nitroGLYCERIN (NITROSTAT) SL tablet 0.4 mg, 0.4 mg, Sublingual, Q5 min PRN, Crissie FiguresEdavally N Reddy, MD .  ondansetron (ZOFRAN) tablet 4 mg, 4 mg, Oral, Q6H PRN **OR** ondansetron (ZOFRAN) injection 4 mg, 4 mg, Intravenous, Q6H PRN, Crissie FiguresEdavally N Reddy, MD .   pantoprazole (PROTONIX) EC tablet 40 mg, 40 mg, Oral, BID, Crissie FiguresEdavally N Reddy, MD, Stopped at 07/18/15 1000 .  phenytoin (DILANTIN) ER capsule 300 mg, 300 mg, Oral, QHS, Crissie FiguresEdavally N Reddy, MD, 300 mg at 07/17/15 2200 .  piperacillin-tazobactam (ZOSYN) IVPB 3.375 g, 3.375 g, Intravenous, Q12H, Sherry RuffingCrystal G Scarpena, RPH, 3.375 g at 07/18/15 2126 .  predniSONE (DELTASONE) tablet 10 mg, 10 mg, Oral, Daily, Crissie FiguresEdavally N Reddy, MD, Stopped at 07/18/15 1000 .  vancomycin (VANCOCIN) IVPB 1000 mg/200 mL premix, 1,000 mg, Intravenous, Once PRN, Sherry Ruffingrystal G Scarpena, RPH .  vitamin C (ASCORBIC ACID) tablet 500 mg, 500 mg, Oral, Daily, Crissie FiguresEdavally N Reddy, MD, 500 mg at 07/17/15 1134     INDWELLING DEVICES:  MICRO DATA: MRSA PCR >>pos Urine  Blood Resp   ANTIMICROBIALS: vanc/zosyn 12/14>>    ASSESSMENT/PLAN   81 yo white female with progressive resp failure and progressive encephalopathy from acute resp failure From acute aspiration pneumonia in the setting of ESRD on HD  Patient is critically ill, prognosis is poor, follow up with palliative care team  PULMONARY Patient is DNR/DNI -bipap and oxygen as needed -wean as tolerated -neb therapy as needed -empiric abx therapy for asp pneumonia  CARDIOVASCULAR Not in shock at this time  RENAL ESRD on HD -follow up nephrology recs -use HD catheter for IV access  GASTROINTESTINAL Keep NPO  HEMATOLOGIC Follow CBC  INFECTIOUS aspirtaion pneumonia -empiric abx   ENDOCRINE - ICU hypoglycemic\Hyperglycemia protocol   NEUROLOGIC -Encephalopathy from resp distress   I have personally obtained a history, examined the patient, evaluated laboratory and independently reviewed  imaging results, formulated the assessment and plan and placed orders.    The Patient requires high complexity decision making for assessment and support, frequent evaluation and titration of therapies, application of advanced monitoring technologies and extensive  interpretation of multiple databases. Critical Care Time devoted to patient care services described in this note is 35 minutes.   Overall, patient is critically ill, prognosis is guarded. Patient at high  risk for cardiac arrest and death.  Recommend comfort care measures  Lucie Leather, M.D.  [facility]  Medical Director [facility] Medical Director [facility] Cardio-Pulmonary Department

## 2015-07-19 NOTE — Progress Notes (Signed)
Pharmacy Antibiotic Follow-up Note  Kristina RhodesJacquelyn R Weyenberg is a 81 y.o. year-old female admitted on 07/17/2015.  The patient is currently on day 3  of vancomycin and Zosyn for aspiration vs HCAP.  Assessment/Plan: After discussion with Dr. Belia HemanKasa, the current antibiotic(s) vancomycin and Zosyn will be narrowed to Zosyn.  Temp (24hrs), Avg:98.7 F (37.1 C), Min:98.4 F (36.9 C), Max:99 F (37.2 C)   Recent Labs Lab 07/17/15 0351 07/18/15 0657 07/19/15 0433  WBC 7.1 9.0 7.8    Recent Labs Lab 07/17/15 0351 07/18/15 0657 07/19/15 0433  CREATININE 2.38* 2.62* 2.93*   Estimated Creatinine Clearance: 13.2 mL/min (by C-G formula based on Cr of 2.93).    Allergies  Allergen Reactions  . Enalapril Anaphylaxis  . Tramadol Shortness Of Breath  . Capsaicin Other (See Comments)    Reaction:  Unknown   . Codeine Other (See Comments)    Reaction:  Unknown   . Hydrocodone Other (See Comments)    Reaction:  Unknown   . Vicodin [Hydrocodone-Acetaminophen] Other (See Comments)    Reaction:  Unknown     Antimicrobials this admission: Vancomycin 12/14 >> 12/16 Zosyn 12/14 >>   Microbiology results: 12/14 BCx: CNS in 1/2 12/14 Sputum: unacceptable  12/14 MRSA PCR: negative  Thank you for allowing pharmacy to be a part of this patient's care.  Luisa HartChristy, Ladon Heney D PharmD 07/19/2015 11:54 AM

## 2015-07-19 NOTE — Progress Notes (Signed)
Visit made. Patient seen lying in bed, staff RN in providing care.Patient is now off Bipap on 2.5 liters of oxygen, remains lethargic. Peritoneal dialysis held   last evening d/t low blood pressure. Family not present at time of visit. Palliative Care now involved. Will continue to follow through final disposition. Thank you. Dayna BarkerKaren Robertson RN, BSN, [facility], hospital liaison [phone] c

## 2015-07-19 NOTE — Progress Notes (Signed)
Speech Therapy Note: reviewed chart notes. MD indicated pt remains on BiPAP, obtunded, opens eyes to vocal stimuli, and does not follow commands. Pt is lethargic upon observation this AM. ST will continue to f/u w/ pt's status next 2-3 days as MD considers weaning pt from BiPAP. Pt is a high risk for aspiration as noted during initial BSE. Rec. NPO status until further assessment can be performed. Rec. Oral care d/t NPO status.

## 2015-07-19 NOTE — Progress Notes (Signed)
Patient CPG 57: 25 gm Dextrose IV administered per hypoglycemic protocol. Gertie GowdaAdvised Jackie, NT to recheck CPG at 08:20.

## 2015-07-20 LAB — BASIC METABOLIC PANEL
Anion gap: 12 (ref 5–15)
BUN: 39 mg/dL — ABNORMAL HIGH (ref 6–20)
CALCIUM: 8.1 mg/dL — AB (ref 8.9–10.3)
CO2: 24 mmol/L (ref 22–32)
CREATININE: 3.39 mg/dL — AB (ref 0.44–1.00)
Chloride: 95 mmol/L — ABNORMAL LOW (ref 101–111)
GFR calc non Af Amer: 11 mL/min — ABNORMAL LOW (ref >60)
GFR, EST AFRICAN AMERICAN: 13 mL/min — AB (ref >60)
Glucose, Bld: 77 mg/dL (ref 65–99)
Potassium: 3.6 mmol/L (ref 3.5–5.1)
Sodium: 131 mmol/L — ABNORMAL LOW (ref 135–145)

## 2015-07-20 LAB — GLUCOSE, CAPILLARY
GLUCOSE-CAPILLARY: 71 mg/dL (ref 65–99)
GLUCOSE-CAPILLARY: 76 mg/dL (ref 65–99)
GLUCOSE-CAPILLARY: 81 mg/dL (ref 65–99)
GLUCOSE-CAPILLARY: 86 mg/dL (ref 65–99)
Glucose-Capillary: 81 mg/dL (ref 65–99)
Glucose-Capillary: 81 mg/dL (ref 65–99)
Glucose-Capillary: 84 mg/dL (ref 65–99)

## 2015-07-20 LAB — CBC
HEMATOCRIT: 34.4 % — AB (ref 35.0–47.0)
Hemoglobin: 10.8 g/dL — ABNORMAL LOW (ref 12.0–16.0)
MCH: 30.3 pg (ref 26.0–34.0)
MCHC: 31.5 g/dL — AB (ref 32.0–36.0)
MCV: 96.1 fL (ref 80.0–100.0)
Platelets: 211 10*3/uL (ref 150–440)
RBC: 3.58 MIL/uL — ABNORMAL LOW (ref 3.80–5.20)
RDW: 18.6 % — AB (ref 11.5–14.5)
WBC: 7.7 10*3/uL (ref 3.6–11.0)

## 2015-07-20 MED ORDER — HYDRALAZINE HCL 20 MG/ML IJ SOLN
10.0000 mg | Freq: Four times a day (QID) | INTRAMUSCULAR | Status: DC | PRN
  Filled 2015-07-20 (×2): qty 1

## 2015-07-20 MED ORDER — LIDOCAINE 5 % EX PTCH
1.0000 | MEDICATED_PATCH | CUTANEOUS | Status: DC
  Administered 2015-07-20 – 2015-07-31 (×11): 1 via TRANSDERMAL
  Filled 2015-07-20 (×13): qty 1

## 2015-07-20 NOTE — Progress Notes (Signed)
[facility] [place] [facility]   PATIENT NAME: Kristina GoltzJacquelyn Eisner    MR#:  [id number]  DATE OF BIRTH:  01/05/1923  SUBJECTIVE:  CHIEF COMPLAINT:   Chief Complaint  Patient presents with  . Shortness of Breath   - admitted for aspiration and also pulmonary edema - peritoneal dialysis was on hold - now patient off Bipap and on high flow and on 2-3L o2 - alert but not following commands or interacting. Remains NPO pending speech eval  REVIEW OF SYSTEMS:  Review of Systems  Unable to perform ROS: mental acuity    DRUG ALLERGIES:   Allergies  Allergen Reactions  . Enalapril Anaphylaxis  . Tramadol Shortness Of Breath  . Capsaicin Other (See Comments)    Reaction:  Unknown   . Codeine Other (See Comments)    Reaction:  Unknown   . Hydrocodone Other (See Comments)    Reaction:  Unknown   . Vicodin [Hydrocodone-Acetaminophen] Other (See Comments)    Reaction:  Unknown     VITALS:  Blood pressure 175/78, pulse 86, temperature 97.8 F (36.6 C), temperature source Oral, resp. rate 18, height 5\' 6"  (1.676 m), weight 79.289 kg (174 lb 12.8 oz), SpO2 97 %.  PHYSICAL EXAMINATION:  Physical Exam  GENERAL:  81 y.o.-year-old patient lying in the bed with no acute distress.  EYES: Pupils equal, round, reactive to light and accommodation. No scleral icterus. Extraocular muscles intact.  HEENT: Head atraumatic, normocephalic. Oropharynx and nasopharynx clear.  NECK:  Supple, no jugular venous distention. No thyroid enlargement, no tenderness.  LUNGS: Normal breath sounds bilaterally, decreased bibasilar breath sounds, crackles heard diffusely,  no wheezing, or rhonchi. No use of accessory muscles of respiration.  CARDIOVASCULAR: S1, S2 normal. No rubs, or gallops. 3/6 systolic murmur present ABDOMEN: Soft, nontender, nondistended. Bowel sounds present. No organomegaly or mass.  EXTREMITIES: No cyanosis, or clubbing. 1+ pedal edema present NEUROLOGIC:  patient is alert, not following commands. Cranial nerves II through XII are intact. No focal deficits noted. Sensation intact. Gait not checked.  PSYCHIATRIC: The patient is alert. Not following commands.  SKIN: No obvious rash, lesion, or ulcer.    LABORATORY PANEL:   CBC  Recent Labs Lab 07/20/15 0500  WBC 7.7  HGB 10.8*  HCT 34.4*  PLT 211   ------------------------------------------------------------------------------------------------------------------  Chemistries   Recent Labs Lab 07/18/15 0657 07/19/15 0433 07/20/15 0500  NA 125* 131* 131*  K 2.7* 3.0* 3.6  CL 91* 97* 95*  CO2 27 26 24   GLUCOSE 88 64* 77  BUN 26* 34* 39*  CREATININE 2.62* 2.93* 3.39*  CALCIUM 8.8* 8.4* 8.1*  MG 2.1  --   --   AST  --  21  --   ALT  --  12*  --   ALKPHOS  --  82  --   BILITOT  --  0.8  --    ------------------------------------------------------------------------------------------------------------------  Cardiac Enzymes  Recent Labs Lab 07/17/15 0351  TROPONINI <0.03   ------------------------------------------------------------------------------------------------------------------  RADIOLOGY:  Dg Chest Port 1 View  07/19/2015  CLINICAL DATA:  Pneumonia. EXAM: PORTABLE CHEST 1 VIEW COMPARISON:  July 17, 2015. FINDINGS: Stable cardiomegaly. Central pulmonary vascular congestion is again noted with bilateral perihilar and basilar edema. Probable mild left pleural effusion is noted. Right internal jugular dialysis catheter is unchanged in position. No pneumothorax is noted. Dextroscoliosis of thoracic spine is noted with associated degenerative change. IMPRESSION: Cardiomegaly with central pulmonary vascular congestion and bilateral pulmonary edema. Electronically Signed  By: Lupita Raider, M.D.   On: 07/19/2015 10:23    EKG:   Orders placed or performed during the hospital encounter of 07/17/15  . ED EKG  . ED EKG  . EKG 12-Lead  . EKG 12-Lead     ASSESSMENT AND PLAN:   Principal Problem:  Aspiration pneumonia (HCC) Active Problems:  CKD (chronic kidney disease), stage IV (HCC)  Pressure ulcer  Chronic respiratory failure (HCC)  HTN (hypertension), benign  * aspiration Vs HCAP pneumonia- causing acute on chronic respiratory distress.  Associated sepsis - present on admission- with altered mental status. vanc discontinued as blood cultures negative, on zosyn  Swallow evaluation pending  appreciate pulm consult  * Bacteremia  Suspected, but the blood culture reported to be contamination, and repeat culture remained negative. - On zosyn  * Ch respi failure  On oxygen, continue. Now weaned to 2-3L nasal cannula - CXR with pulm vascular congestion- will need dialysis  Uses Bipap at night,  * ESRD  on PD- nephro consult. Also has a right chest permacath PD has been on hold for the last couple of days - appreciate nephrology consult, will need dialysis today  * Htn  Hold oral meds now- can give prn hydralazine if needed.  * CVA Hx  Bedbound- is total care.  Now worsening for few weeks- poor prognosis  * Hx of seizure disorder  On dilantin  Hold oral meds while NPO   Cont to monitor, physical therapy consult when able to  All the records are reviewed and case discussed with Care Management/Social Workerr. Management plans discussed with the patient, family and they are in agreement.  CODE STATUS: DNR  TOTAL TIME TAKING CARE OF THIS PATIENT: 37 minutes.   POSSIBLE D/C IN 3 DAYS, DEPENDING ON CLINICAL CONDITION.   Enid Baas M.D on 07/20/2015 at 1:15 PM  Between 7am to 6pm - Pager - [phone]  After 6pm go to [email or web address] - password [email or web address]  [place] [facility]  Office  [phone]  CC: Primary care physician; Jaclyn Shaggy, MD

## 2015-07-20 NOTE — Progress Notes (Signed)
Received notification from nurse aid that pt had complained of not being able to breath during bed changing.   pt O2 sat, resulted in 69% on 3L. Went in to assess pt and found pt SOB. Placed pt on non-rebreather mask and notified respiratory. Charge and staff also aware. Listened to pt lungs, patient sounds very congested and has coarse crackles. Notified Dr. Nemiah CommanderKalisetti after getting pt 02 back up to 100 on non-rebreather and weaning to nasal cannula. Pt currently sitting in bed on 3.5L nasal cannula, 02 sat 97%. Received order to discontinue IV fluids continue to assess.

## 2015-07-20 NOTE — Progress Notes (Signed)
Pt alert. Noticed pt grimacing during dressing change to back and after repositioning. Pt family requested additional "patches" be placed to help patient with discomfort on back area. Notified MD and received order for lidocaine patch to be changed daily, while continuing to monitor patient alertness. Pt already has order for fentanyl patch q 72hr. Continue to assess.

## 2015-07-20 NOTE — Progress Notes (Signed)
[facility]  ROUNDING NOTE   Subjective:   Daughter at bedside. Patient unable to swallow. Getting IV medications through RIJ permcath Peritoneal dialysis held for last two nights.   Objective:  Vital signs in last 24 hours:  Temp:  [97.8 F (36.6 C)-99.2 F (37.3 C)] 97.8 F (36.6 C) (12/17 0623) Pulse Rate:  [38-86] 86 (12/17 0623) Resp:  [16-23] 18 (12/17 0623) BP: (82-175)/(19-78) 175/78 mmHg (12/17 0623) SpO2:  [92 %-100 %] 97 % (12/17 0623) Weight:  [79.289 kg (174 lb 12.8 oz)] 79.289 kg (174 lb 12.8 oz) (12/17 0611)  Weight change: 3.289 kg (7 lb 4 oz) Filed Weights   07/18/15 0915 07/19/15 0500 07/20/15 0611  Weight: 76 kg (167 lb 8.8 oz) 74.9 kg (165 lb 2 oz) 79.289 kg (174 lb 12.8 oz)    Intake/Output: I/O last 3 completed shifts: In: 564.1 [I.V.:464.1; IV Piggyback:100] Out: -    Intake/Output this shift:  Total I/O In: 66.7 [I.V.:66.7] Out: -   Physical Exam: General: Ill appearing  Head: Normocephalic, atraumatic. Moist oral mucosal membranes  Eyes: Anicteric  Neck: Supple, trachea midline  Lungs:  Bilateral rhonchi  Heart: S1S2 no rubs  Abdomen:  Soft, nontender, BS present   Extremities: 1-2+ peripheral edema.  Neurologic: Lethargic but arousable  Skin: No lesions  Access: PD catheter in place, RIJ permcath    Basic Metabolic Panel:  Recent Labs Lab 07/17/15 0351 07/18/15 0657 07/19/15 0433 07/20/15 0500  NA 128* 125* 131* 131*  K 3.0* 2.7* 3.0* 3.6  CL 92* 91* 97* 95*  CO2 GLUCOSE 134* 88 64* 77  BUN 23* 26* 34* 39*  CREATININE 2.38* 2.62* 2.93* 3.39*  CALCIUM 9.5 8.8* 8.4* 8.1*  MG  --  2.1  --   --     Liver Function Tests:  Recent Labs Lab 07/19/15 0433  AST 21  ALT 12*  ALKPHOS 82  BILITOT 0.8  PROT 4.6*  ALBUMIN 1.9*   No results for input(s): LIPASE, AMYLASE in the last 168 hours. No results for input(s): AMMONIA in the last 168 hours.  CBC:  Recent Labs Lab 07/17/15 0351  07/18/15 0657 07/19/15 0433 07/20/15 0500  WBC 7.1 9.0 7.8 7.7  HGB 12.2 12.0 11.3* 10.8*  HCT 38.5 37.5 34.6* 34.4*  MCV 95.9 95.9 96.1 96.1  PLT 252 233 206 211    Cardiac Enzymes:  Recent Labs Lab 07/17/15 0351  TROPONINI <0.03    BNP: Invalid input(s): POCBNP  CBG:  Recent Labs Lab 07/19/15 1613 07/19/15 2135 07/20/15 0002 07/20/15 0400 07/20/15 0735  GLUCAP 67 74 81 81 81    Microbiology: Results for orders placed or performed during the hospital encounter of 07/17/15  Blood culture (routine x 2)     Status: None (Preliminary result)   Collection Time: 07/17/15  4:02 AM  Result Value Ref Range Status   Specimen Description BLOOD RIGHT HAND  Final   Special Requests BOTTLES DRAWN AEROBIC AND ANAEROBIC  Final   Culture NO GROWTH 3 DAYS  Final   Report Status PENDING  Incomplete  Blood culture (routine x 2)     Status: None (Preliminary result)   Collection Time: 07/17/15  4:02 AM  Result Value Ref Range Status   Specimen Description BLOOD LEFT ANTECUBITAL  Final   Special Requests BOTTLES DRAWN AEROBIC AND ANAEROBIC  Final   Culture  Setup Time   Final    GRAM POSITIVE COCCI IN CLUSTERS  AEROBIC BOTTLE ONLY CRITICAL RESULT CALLED TO, READ BACK BY AND VERIFIED WITH: MARCELLA TURNER AT 2253 07/17/2015 BY TFK CONFIRMED BY MLZ    Culture   Final    COAGULASE NEGATIVE STAPHYLOCOCCUS AEROBIC BOTTLE ONLY Results consistent with contamination.    Report Status PENDING  Incomplete  MRSA PCR Screening     Status: Abnormal   Collection Time: 07/17/15  6:51 AM  Result Value Ref Range Status   MRSA by PCR POSITIVE (A) NEGATIVE Final    Comment:        The GeneXpert MRSA Assay (FDA approved for NASAL specimens only), is one component of a comprehensive MRSA colonization surveillance program. It is not intended to diagnose MRSA infection nor to guide or monitor treatment for MRSA infections. CRITICAL RESULT CALLED TO, READ BACK BY AND VERIFIED  WITH: Yehuda SavannahLauren Hobbs @ 07/16/16 by [facility]   Culture, sputum-assessment     Status: None   Collection Time: 07/17/15  5:44 PM  Result Value Ref Range Status   Specimen Description SPUTUM  Final   Special Requests NONE  Final   Sputum evaluation   Final    Sputum specimen not acceptable for testing.  Please recollect.   CALLED TO MARCELLA TURNER @ 2009 ON 07/17/2015 BY CAF   Report Status 07/17/2015 FINAL  Final  Culture, blood (single)     Status: None (Preliminary result)   Collection Time: 07/18/15 10:02 AM  Result Value Ref Range Status   Specimen Description BLOOD LEFT HAND  Final   Special Requests BOTTLES DRAWN AEROBIC AND ANAEROBIC  2CC  Final   Culture NO GROWTH 2 DAYS  Final   Report Status PENDING  Incomplete    Coagulation Studies: No results for input(s): LABPROT, INR in the last 72 hours.  Urinalysis: No results for input(s): COLORURINE, LABSPEC, PHURINE, GLUCOSEU, HGBUR, BILIRUBINUR, KETONESUR, PROTEINUR, UROBILINOGEN, NITRITE, LEUKOCYTESUR in the last 72 hours.  Invalid input(s): APPERANCEUR    Imaging: Dg Chest Port 1 View  07/19/2015  CLINICAL DATA:  Pneumonia. EXAM: PORTABLE CHEST 1 VIEW COMPARISON:  July 17, 2015. FINDINGS: Stable cardiomegaly. Central pulmonary vascular congestion is again noted with bilateral perihilar and basilar edema. Probable mild left pleural effusion is noted. Right internal jugular dialysis catheter is unchanged in position. No pneumothorax is noted. Dextroscoliosis of thoracic spine is noted with associated degenerative change. IMPRESSION: Cardiomegaly with central pulmonary vascular congestion and bilateral pulmonary edema. Electronically Signed   By: Lupita RaiderJames  Green Jr, M.D.   On: 07/19/2015 10:23     Medications:   . dextrose 40 mL/hr at 07/19/15 1757   . amLODipine  10 mg Oral QPM  . antiseptic oral rinse  7 mL Mouth Rinse BID  . aspirin EC  81 mg Oral Daily  . carvedilol  12.5 mg Oral Q12H  . Chlorhexidine Gluconate Cloth  6  each Topical Q0600  . cloNIDine  0.1 mg Oral TID  . cyanocobalamin  500 mcg Oral Daily  . dialysis solution 1.5% low-MG/low-CA   Intraperitoneal Q24H  . docusate sodium  100 mg Oral QODAY  . fentaNYL  25 mcg Transdermal Q72H  . fluticasone  2 spray Each Nare Daily  . furosemide  40 mg Oral Daily  . gentamicin cream  1 application Topical Daily  . heparin  5,000 Units Subcutaneous 3 times per day  . hydrALAZINE  100 mg Oral TID  . latanoprost  1 drop Both Eyes QHS  . loratadine  10 mg Oral Daily  . mirtazapine  15 mg Oral QHS  . multivitamin-lutein  1 capsule Oral BID  . mupirocin ointment  1 application Nasal BID  . pantoprazole (PROTONIX) IV  40 mg Intravenous Q12H  . phenytoin (DILANTIN) IV  300 mg Intravenous QHS  . piperacillin-tazobactam (ZOSYN)  IV  3.375 g Intravenous Q12H  . predniSONE  10 mg Oral Daily  . ascorbic acid  500 mg Oral Daily   acetaminophen, albuterol, lactulose, morphine injection, nitroGLYCERIN, ondansetron **OR** ondansetron (ZOFRAN) IV  Assessment/ Plan:  81 y.o. female with past medical history of hypertension, obstructive sleep apnea, DJD, CVA with residual left sided weakness, carotid stenosis status post L CEA 2/15, seizure disorder, diastolic congestive heart failure, anemia  [facility] PD [facility]  1. End Stage Renal Disease on PD: Peritoneal dialysis held for last two nights. Will reinitiate today.  - if patient is to get a PEG, I would not recommend continuing peritoneal dialysis. However family is unclear if they want hemodialysis.   2. Hypertension: Allergy to enalapril (causes anaphylaxis). Blood pressures elevated.  - home regimen of amlodipine, carvedilol, clonidine, furosemide, hydralazine.   3. Secondary hyperparathyroidism. Phos and calcium low outpatient due to poor nutritional status. Not currently on binders.   4. Anemia chronic kidney disease: Hemoglobin 10.8.  - hold epo for now.   5.  Bacterial pneumonia/respiratory failure:   Suspect aspiration.  - empiric antibiotics.   Overall prognosis is poor. Outpatient albumin of 2.3. Currently DNR but family wants dialysis if possible.    LOS: 3 Jeanclaude Wentworth 12/17/201611:50 AM

## 2015-07-21 ENCOUNTER — Inpatient Hospital Stay: Payer: Medicare Other

## 2015-07-21 LAB — CBC
HCT: 34.5 % — ABNORMAL LOW (ref 35.0–47.0)
HEMOGLOBIN: 10.9 g/dL — AB (ref 12.0–16.0)
MCH: 30.1 pg (ref 26.0–34.0)
MCHC: 31.5 g/dL — ABNORMAL LOW (ref 32.0–36.0)
MCV: 95.8 fL (ref 80.0–100.0)
PLATELETS: 194 10*3/uL (ref 150–440)
RBC: 3.6 MIL/uL — AB (ref 3.80–5.20)
RDW: 18.4 % — ABNORMAL HIGH (ref 11.5–14.5)
WBC: 7.2 10*3/uL (ref 3.6–11.0)

## 2015-07-21 LAB — BASIC METABOLIC PANEL
ANION GAP: 11 (ref 5–15)
BUN: 43 mg/dL — AB (ref 6–20)
CHLORIDE: 96 mmol/L — AB (ref 101–111)
CO2: 22 mmol/L (ref 22–32)
Calcium: 8 mg/dL — ABNORMAL LOW (ref 8.9–10.3)
Creatinine, Ser: 3.81 mg/dL — ABNORMAL HIGH (ref 0.44–1.00)
GFR, EST AFRICAN AMERICAN: 11 mL/min — AB (ref >60)
GFR, EST NON AFRICAN AMERICAN: 10 mL/min — AB (ref >60)
Glucose, Bld: 119 mg/dL — ABNORMAL HIGH (ref 65–99)
POTASSIUM: 3.8 mmol/L (ref 3.5–5.1)
SODIUM: 129 mmol/L — AB (ref 135–145)

## 2015-07-21 LAB — BLOOD GAS, ARTERIAL
ACID-BASE DEFICIT: 3.1 mmol/L — AB (ref 0.0–2.0)
Allens test (pass/fail): POSITIVE — AB
BICARBONATE: 24 meq/L (ref 21.0–28.0)
FIO2: 0.36
O2 SAT: 96.1 %
PATIENT TEMPERATURE: 37
pCO2 arterial: 51 mmHg — ABNORMAL HIGH (ref 32.0–48.0)
pH, Arterial: 7.28 — ABNORMAL LOW (ref 7.350–7.450)
pO2, Arterial: 93 mmHg (ref 83.0–108.0)

## 2015-07-21 LAB — GLUCOSE, CAPILLARY
GLUCOSE-CAPILLARY: 58 mg/dL — AB (ref 65–99)
GLUCOSE-CAPILLARY: 102 mg/dL — AB (ref 65–99)
GLUCOSE-CAPILLARY: 79 mg/dL (ref 65–99)
Glucose-Capillary: 64 mg/dL — ABNORMAL LOW (ref 65–99)
Glucose-Capillary: 140 mg/dL — ABNORMAL HIGH (ref 65–99)
Glucose-Capillary: 104 mg/dL — ABNORMAL HIGH (ref 65–99)
Glucose-Capillary: 91 mg/dL (ref 65–99)

## 2015-07-21 MED ORDER — HYDRALAZINE HCL 20 MG/ML IJ SOLN
10.0000 mg | Freq: Four times a day (QID) | INTRAMUSCULAR | Status: DC | PRN
Start: 2015-07-21 — End: 2015-07-21

## 2015-07-21 MED ORDER — DEXTROSE 50 % IV SOLN
INTRAVENOUS | Status: AC
  Administered 2015-07-21: 50 mL via INTRAVENOUS
  Filled 2015-07-21: qty 50

## 2015-07-21 MED ORDER — DEXTROSE 50 % IV SOLN: 1.0000 | Freq: Once | INTRAVENOUS | Status: DC

## 2015-07-21 MED ORDER — DEXTROSE 50 % IV SOLN
50.0000 mL | Freq: Once | INTRAVENOUS | Status: AC
  Administered 2015-07-21 (×2): 50 mL via INTRAVENOUS
  Filled 2015-07-21: qty 50

## 2015-07-21 NOTE — Progress Notes (Signed)
[facility] [place] [facility] [place]   PATIENT NAME: Kristina Wilson    MR#:  [id number]  DATE OF BIRTH:  1922-10-09  SUBJECTIVE:  CHIEF COMPLAINT:   Chief Complaint  Patient presents with  . Shortness of Breath   - alert this morning, arousable and saying that she cannot hear properly - Feels like she cannot swallow good. Hypoglycemic due to poor intake and NPO status - swallow eval pending. - peritoneal dialysis hasnt been done since 07/12/15  REVIEW OF SYSTEMS:  Review of Systems  Unable to perform ROS: mental acuity    DRUG ALLERGIES:   Allergies  Allergen Reactions  . Enalapril Anaphylaxis  . Tramadol Shortness Of Breath  . Capsaicin Other (See Comments)    Reaction:  Unknown   . Codeine Other (See Comments)    Reaction:  Unknown   . Hydrocodone Other (See Comments)    Reaction:  Unknown   . Vicodin [Hydrocodone-Acetaminophen] Other (See Comments)    Reaction:  Unknown     VITALS:  Blood pressure 109/96, pulse 81, temperature 98.3 F (36.8 C), temperature source Axillary, resp. rate 18, height  (1.676 m), weight 79.697 kg (175 lb 11.2 oz), SpO2 99 %.  PHYSICAL EXAMINATION:  Physical Exam  GENERAL:  81 y.o.-year-old patient lying in the bed with no acute distress.  EYES: Pupils equal, round, reactive to light and accommodation. No scleral icterus. Extraocular muscles intact.  HEENT: Head atraumatic, normocephalic. Oropharynx and nasopharynx clear.  NECK:  Supple, no jugular venous distention. No thyroid enlargement, no tenderness.  LUNGS: Normal breath sounds bilaterally, decreased bibasilar breath sounds, crackles heard diffusely,  no wheezing, or rhonchi. No use of accessory muscles of respiration.  CARDIOVASCULAR: S1, S2 normal. No rubs, or gallops. 3/6 systolic murmur present ABDOMEN: Soft, nontender, nondistended. Bowel sounds present. No organomegaly or mass.  EXTREMITIES: No cyanosis, or clubbing. 1+ pedal edema  present Complains of tenderness when her legs are being examined NEUROLOGIC: patient is alert, not following commands. Cranial nerves II through XII are intact. No focal deficits noted. Sensation intact. Gait not checked.  PSYCHIATRIC: The patient is sleepy, but easily arousable. Not following commands as she says that she cannot hear anything.  SKIN: No obvious rash, lesion, or ulcer.    LABORATORY PANEL:   CBC  Recent Labs Lab 07/21/15 0526  WBC 7.2  HGB 10.9*  HCT 34.5*  PLT 194   ------------------------------------------------------------------------------------------------------------------  Chemistries   Recent Labs Lab 07/18/15 0657 07/19/15 0433  07/21/15 0526  NA 125* 131*  < > 129*  K 2.7* 3.0*  < > 3.8  CL 91* 97*  < > 96*  CO2 27 26  < > 22  GLUCOSE 88 64*  < > 119*  BUN 26* 34*  < > 43*  CREATININE 2.62* 2.93*  < > 3.81*  CALCIUM 8.8* 8.4*  < > 8.0*  MG 2.1  --   --   --   AST  --  21  --   --   ALT  --  12*  --   --   ALKPHOS  --  82  --   --   BILITOT  --  0.8  --   --   < > = values in this interval not displayed. ------------------------------------------------------------------------------------------------------------------  Cardiac Enzymes  Recent Labs Lab 07/17/15 0351  TROPONINI <0.03   ------------------------------------------------------------------------------------------------------------------  RADIOLOGY:  No results found.  EKG:   Orders placed or performed during the hospital encounter  of 07/17/15  . ED EKG  . ED EKG  . EKG 12-Lead  . EKG 12-Lead    ASSESSMENT AND PLAN:   Principal Problem:  Aspiration pneumonia (HCC) Active Problems:  CKD (chronic kidney disease), stage IV (HCC)  Pressure ulcer  Chronic respiratory failure (HCC)  HTN (hypertension), benign  * aspiration Vs HCAP pneumonia- causing acute on chronic respiratory distress.  Associated sepsis - present on admission- with altered mental  status. vanc discontinued as blood cultures negative, on zosyn  Swallow evaluation pending  appreciate pulm consult  * Bacteremia  Suspected, but the blood culture reported to be contamination, and repeat culture remained negative. - On zosyn  * Chronic respiratory failure  On oxygen, continue. Now weaned to 4L nasal cannula - CXR with pulm vascular congestion- will need dialysis  Uses Bipap at night, -ABG today due to sleepy condition. If needed will start BiPAP  * ESRD  on PD- nephro consult. Hasn't had dialysis done in 3 days now. Also has a right chest permacath - appreciate nephrology consult,  -And had missed multiple outpatient appointments to for hemodialysis due to her poor health condition.  -family wants to continue dialysis but patient hasn't been stable enough.  * Htn  Hold oral meds now- can give prn hydralazine if needed.  * CVA Hx  Bedbound- is total care.  Now worsening for few weeks- poor prognosis  * Hx of seizure disorder  On dilantin  Hold oral meds while NPO  * Hypoglycemia- no intake as NPO, sugars borderline, cannot give D5 fluids as fluid overloaded and not getting dialyzed. Close monitor - family considering PEG tube. If PEG tube placed, PD catheter needs to come out.   Cont to monitor, physical therapy consult when able to Overall poor prognosis.   All the records are reviewed and case discussed with Care Management/Social Workerr. Management plans discussed with the patient, family and they are in agreement.  CODE STATUS: DNR  TOTAL TIME TAKING CARE OF THIS PATIENT: 37 minutes.   POSSIBLE D/C IN 3 DAYS, DEPENDING ON CLINICAL CONDITION.   Enid BaasKALISETTI,Jaziel Bennett M.D on 07/21/2015 at 1:15 PM  Between 7am to 6pm - Pager - [phone]  After 6pm go to [email or web address] - password [email or web address] [facility]  [facility]  Office  [phone]  CC: Primary care physician; Jaclyn ShaggyATE,DENNY C, MD

## 2015-07-21 NOTE — Progress Notes (Addendum)
[facility]  ROUNDING NOTE   Subjective:   Hypoglycemia this morning.   Peritoneal dialysis held for last three nights.   Objective:  Vital signs in last 24 hours:  Temp:  [97.6 F (36.4 C)-98.5 F (36.9 C)] 98.5 F (36.9 C) (12/18 0505) Pulse Rate:  [77-81] 77 (12/18 0505) Resp:  [17-20] 20 (12/18 0505) BP: (127-156)/(48-80) 127/48 mmHg (12/18 0505) SpO2:  [69 %-100 %] 98 % (12/18 0505) Weight:  [79.697 kg (175 lb 11.2 oz)] 79.697 kg (175 lb 11.2 oz) (12/18 0505)  Weight change: 0.408 kg (14.4 oz) Filed Weights   07/19/15 0500 07/20/15 0611 07/21/15 0505  Weight: 74.9 kg (165 lb 2 oz) 79.289 kg (174 lb 12.8 oz) 79.697 kg (175 lb 11.2 oz)    Intake/Output: I/O last 3 completed shifts: In: 680.8 [I.V.:530.8; IV Piggyback:150] Out: 0    Intake/Output this shift:     Physical Exam: General: Ill appearing  Head: Normocephalic, atraumatic. Moist oral mucosal membranes  Eyes: Anicteric  Neck: Supple, trachea midline  Lungs:  Bilateral rhonchi  Heart: S1S2 no rubs  Abdomen:  Soft, nontender, BS present   Extremities: 1-2+ peripheral edema.  Neurologic: Lethargic but arousable  Skin: No lesions  Access: PD catheter in place, RIJ permcath    Basic Metabolic Panel:  Recent Labs Lab 07/17/15 0351 07/18/15 0657 07/19/15 0433 07/20/15 0500 07/21/15 0526  NA 128* 125* 131* 131* 129*  K 3.0* 2.7* 3.0* 3.6 3.8  CL 92* 91* 97* 95* 96*  CO2 GLUCOSE 134* 88 64* 77 119*  BUN 23* 26* 34* 39* 43*  CREATININE 2.38* 2.62* 2.93* 3.39* 3.81*  CALCIUM 9.5 8.8* 8.4* 8.1* 8.0*  MG  --  2.1  --   --   --     Liver Function Tests:  Recent Labs Lab 07/19/15 0433  AST 21  ALT 12*  ALKPHOS 82  BILITOT 0.8  PROT 4.6*  ALBUMIN 1.9*   No results for input(s): LIPASE, AMYLASE in the last 168 hours. No results for input(s): AMMONIA in the last 168 hours.  CBC:  Recent Labs Lab 07/17/15 0351 07/18/15 0657 07/19/15 0433 07/20/15 0500  07/21/15 0526  WBC 7.1 9.0 7.8 7.7 7.2  HGB 12.2 12.0 11.3* 10.8* 10.9*  HCT 38.5 37.5 34.6* 34.4* 34.5*  MCV 95.9 95.9 96.1 96.1 95.8  PLT 252 233 206 211 194    Cardiac Enzymes:  Recent Labs Lab 07/17/15 0351  TROPONINI <0.03    BNP: Invalid input(s): POCBNP  CBG:  Recent Labs Lab 07/20/15 2117 07/20/15 2351 07/21/15 0401 07/21/15 0527 07/21/15 0736  GLUCAP 76 71 58* 102* 79    Microbiology: Results for orders placed or performed during the hospital encounter of 07/17/15  Blood culture (routine x 2)     Status: None (Preliminary result)   Collection Time: 07/17/15  4:02 AM  Result Value Ref Range Status   Specimen Description BLOOD RIGHT HAND  Final   Special Requests BOTTLES DRAWN AEROBIC AND ANAEROBIC  Final   Culture NO GROWTH 4 DAYS  Final   Report Status PENDING  Incomplete  Blood culture (routine x 2)     Status: None (Preliminary result)   Collection Time: 07/17/15  4:02 AM  Result Value Ref Range Status   Specimen Description BLOOD LEFT ANTECUBITAL  Final   Special Requests BOTTLES DRAWN AEROBIC AND ANAEROBIC  Final   Culture  Setup Time   Final    GRAM  POSITIVE COCCI IN CLUSTERS AEROBIC BOTTLE ONLY CRITICAL RESULT CALLED TO, READ BACK BY AND VERIFIED WITH: MARCELLA TURNER AT 2253 07/17/2015 BY TFK CONFIRMED BY MLZ    Culture   Final    COAGULASE NEGATIVE STAPHYLOCOCCUS AEROBIC BOTTLE ONLY Results consistent with contamination.    Report Status PENDING  Incomplete  MRSA PCR Screening     Status: Abnormal   Collection Time: 07/17/15  6:51 AM  Result Value Ref Range Status   MRSA by PCR POSITIVE (A) NEGATIVE Final    Comment:        The GeneXpert MRSA Assay (FDA approved for NASAL specimens only), is one component of a comprehensive MRSA colonization surveillance program. It is not intended to diagnose MRSA infection nor to guide or monitor treatment for MRSA infections. CRITICAL RESULT CALLED TO, READ BACK BY AND VERIFIED  WITH: Yehuda SavannahLauren Hobbs @ 07/16/16 by [facility]   Culture, sputum-assessment     Status: None   Collection Time: 07/17/15  5:44 PM  Result Value Ref Range Status   Specimen Description SPUTUM  Final   Special Requests NONE  Final   Sputum evaluation   Final    Sputum specimen not acceptable for testing.  Please recollect.   CALLED TO MARCELLA TURNER @ 2009 ON 07/17/2015 BY CAF   Report Status 07/17/2015 FINAL  Final  Culture, blood (single)     Status: None (Preliminary result)   Collection Time: 07/18/15 10:02 AM  Result Value Ref Range Status   Specimen Description BLOOD LEFT HAND  Final   Special Requests BOTTLES DRAWN AEROBIC AND ANAEROBIC  2CC  Final   Culture NO GROWTH 3 DAYS  Final   Report Status PENDING  Incomplete    Coagulation Studies: No results for input(s): LABPROT, INR in the last 72 hours.  Urinalysis: No results for input(s): COLORURINE, LABSPEC, PHURINE, GLUCOSEU, HGBUR, BILIRUBINUR, KETONESUR, PROTEINUR, UROBILINOGEN, NITRITE, LEUKOCYTESUR in the last 72 hours.  Invalid input(s): APPERANCEUR    Imaging: No results found.   Medications:     . amLODipine  10 mg Oral QPM  . antiseptic oral rinse  7 mL Mouth Rinse BID  . aspirin EC  81 mg Oral Daily  . carvedilol  12.5 mg Oral Q12H  . Chlorhexidine Gluconate Cloth  6 each Topical Q0600  . cloNIDine  0.1 mg Oral TID  . cyanocobalamin  500 mcg Oral Daily  . dialysis solution 1.5% low-MG/low-CA   Intraperitoneal Q24H  . docusate sodium  100 mg Oral QODAY  . fentaNYL  25 mcg Transdermal Q72H  . fluticasone  2 spray Each Nare Daily  . furosemide  40 mg Oral Daily  . gentamicin cream  1 application Topical Daily  . heparin  5,000 Units Subcutaneous 3 times per day  . hydrALAZINE  100 mg Oral TID  . latanoprost  1 drop Both Eyes QHS  . lidocaine  1 patch Transdermal Q24H  . loratadine  10 mg Oral Daily  . mirtazapine  15 mg Oral QHS  . multivitamin-lutein  1 capsule Oral BID  . mupirocin ointment  1 application  Nasal BID  . pantoprazole (PROTONIX) IV  40 mg Intravenous Q12H  . phenytoin (DILANTIN) IV  300 mg Intravenous QHS  . piperacillin-tazobactam (ZOSYN)  IV  3.375 g Intravenous Q12H  . predniSONE  10 mg Oral Daily  . ascorbic acid  500 mg Oral Daily   acetaminophen, albuterol, hydrALAZINE, lactulose, morphine injection, nitroGLYCERIN, ondansetron **OR** ondansetron (ZOFRAN) IV  Assessment/ Plan:  81  y.o. female with past medical history of hypertension, obstructive sleep apnea, DJD, CVA with residual left sided weakness, carotid stenosis status post L CEA 2/15, seizure disorder, diastolic congestive heart failure, anemia  [facility] PD [facility]  1. End Stage Renal Disease on PD: Peritoneal dialysis held for last three nights. She was only getting training for PD. Last hemodialysis session was last Friday,  12/9 - if patient is to get a PEG, I would not recommend continuing peritoneal dialysis. However family is unclear if they want hemodialysis. Currently not acute indication for dialysis.   2. Hypertension: Allergy to enalapril (causes anaphylaxis). Blood pressures now at goal.  - home regimen of amlodipine, carvedilol, clonidine, furosemide, hydralazine.   3. Secondary hyperparathyroidism. Phos and calcium low outpatient due to poor nutritional status. Not currently on binders.   4. Anemia chronic kidney disease: Hemoglobin 10.9 - hold epo for now.   5.  Bacterial pneumonia/respiratory failure:  Suspect aspiration. No swallow eval can be done today. - empiric antibiotics: zosyn. vanc last given on 12/14.   --------------------------------------------------------------------------------------------- Addendum at 18:07 Called back as patient become more lethargic and short of breath. ABG shows respiratory acidosis. Patient transferred to ICU and placed on BiPap. Respiratory failure.  Patient with diffuse crackles bilaterally on examination.  Will do emergent hemodialysis tonight. UF goal  of 2.5 litres.  Orders prepared.    LOS: 4 Sharion Grieves 12/18/201610:31 AM

## 2015-07-21 NOTE — Progress Notes (Signed)
BG of 64.  MD notified orders were given to administer x1 Ampule of Dextrose.  After administration of Dextrose pt BG was re-checked and was 140.  Will continue to monitor.

## 2015-07-21 NOTE — Progress Notes (Signed)
Patient's blood sugar returned to WNL after D50 was given.  Blood sugar 102.  Kristina Wilson, Kristina Wilson N  07/21/2015  6:27 AM

## 2015-07-21 NOTE — Progress Notes (Signed)
Called Dr. Clint GuyHower regarding patient's low blood sugar of 58.  Doctor ordered amp of D50.  Lennon AlstromClay, Makel Mcmann N  4:09 AM  07/21/2015

## 2015-07-21 NOTE — Progress Notes (Signed)
Pt daughter informed RN that she felt as though her mother kept repeating things and was more lethargic today than she was yesterday.  Dr. Mack HookKallisetti was notified and a lab order for an ABG was drawn.  ABG's labs were abnormal and Dr. Mack HookKallisetti was notfied.  Orders were given to transfer pt to ICU.

## 2015-07-21 NOTE — Progress Notes (Addendum)
Unable to administer oral medications to pt.  Pt refused to take in medications stating "I can't swallow."  Swallow evaluation pending at this time.  MD notified.  No further orders or interventions at this time.  Will continue to monitor.

## 2015-07-22 LAB — PHENYTOIN LEVEL, TOTAL: PHENYTOIN LVL: 5 ug/mL — AB (ref 10.0–20.0)

## 2015-07-22 LAB — BASIC METABOLIC PANEL
Anion gap: 11 (ref 5–15)
BUN: 20 mg/dL (ref 6–20)
CALCIUM: 7.9 mg/dL — AB (ref 8.9–10.3)
CHLORIDE: 99 mmol/L — AB (ref 101–111)
CO2: 27 mmol/L (ref 22–32)
CREATININE: 2.42 mg/dL — AB (ref 0.44–1.00)
GFR calc non Af Amer: 17 mL/min — ABNORMAL LOW (ref >60)
GFR, EST AFRICAN AMERICAN: 19 mL/min — AB (ref >60)
Glucose, Bld: 92 mg/dL (ref 65–99)
Potassium: 2.9 mmol/L — CL (ref 3.5–5.1)
SODIUM: 137 mmol/L (ref 135–145)

## 2015-07-22 LAB — CULTURE, BLOOD (ROUTINE X 2): CULTURE: NO GROWTH

## 2015-07-22 LAB — GLUCOSE, CAPILLARY
GLUCOSE-CAPILLARY: 88 mg/dL (ref 65–99)
GLUCOSE-CAPILLARY: 90 mg/dL (ref 65–99)
Glucose-Capillary: 86 mg/dL (ref 65–99)
Glucose-Capillary: 73 mg/dL (ref 65–99)
Glucose-Capillary: 93 mg/dL (ref 65–99)
Glucose-Capillary: 82 mg/dL (ref 65–99)

## 2015-07-22 LAB — BLOOD GAS, ARTERIAL
ACID-BASE EXCESS: 0.5 mmol/L (ref 0.0–3.0)
ALLENS TEST (PASS/FAIL): POSITIVE — AB
Bicarbonate: 27.3 mEq/L (ref 21.0–28.0)
FIO2: 0.21
O2 Saturation: 78.7 %
PATIENT TEMPERATURE: 37
PH ART: 7.32 — AB (ref 7.350–7.450)
pCO2 arterial: 53 mmHg — ABNORMAL HIGH (ref 32.0–48.0)
pO2, Arterial: 47 mmHg — ABNORMAL LOW (ref 83.0–108.0)

## 2015-07-22 LAB — POTASSIUM: POTASSIUM: 3 mmol/L — AB (ref 3.5–5.1)

## 2015-07-22 MED ORDER — POTASSIUM CHLORIDE 10 MEQ/50ML IV SOLN: 10.0000 meq | Freq: Once | INTRAVENOUS | Status: DC

## 2015-07-22 MED ORDER — SODIUM CHLORIDE 0.9 % IV SOLN
300.0000 mg | Freq: Every day | INTRAVENOUS | Status: DC
  Administered 2015-07-23 (×2): 300 mg via INTRAVENOUS
  Filled 2015-07-22 (×3): qty 6

## 2015-07-22 MED ORDER — POTASSIUM CHLORIDE 10 MEQ/100ML IV SOLN
10.0000 meq | Freq: Once | INTRAVENOUS | Status: AC
  Administered 2015-07-22: 10 meq via INTRAVENOUS
  Filled 2015-07-22: qty 100

## 2015-07-22 MED ORDER — PHENYTOIN SODIUM EXTENDED 100 MG PO CAPS
300.0000 mg | ORAL_CAPSULE | Freq: Every day | ORAL | Status: DC
  Filled 2015-07-22: qty 3

## 2015-07-22 MED ORDER — POTASSIUM CHLORIDE CRYS ER 20 MEQ PO TBCR
40.0000 meq | EXTENDED_RELEASE_TABLET | Freq: Once | ORAL | Status: AC
  Administered 2015-07-22: 40 meq via ORAL
  Filled 2015-07-22: qty 2

## 2015-07-22 NOTE — Progress Notes (Signed)
[facility]  ROUNDING NOTE   Subjective:   Patient was dialyzed emergently last night Daughter is at bedside 2500 cc was removed No acute SOB Off of BiPAP at present   Objective:  Vital signs in last 24 hours:  Temp:  [98.2 F (36.8 C)-99.5 F (37.5 C)] 98.3 F (36.8 C) (12/19 0800) Pulse Rate:  [37-96] 79 (12/19 0800) Resp:  [12-25] 18 (12/19 0800) BP: (105-172)/(43-129) 113/47 mmHg (12/19 0800) SpO2:  [84 %-100 %] 96 % (12/19 0800) FiO2 (%):  [30 %] 30 % (12/19 0200) Weight:  [72.9 kg (160 lb 11.5 oz)] 72.9 kg (160 lb 11.5 oz) (12/19 0500)  Weight change: -6.797 kg (-14 lb 15.8 oz) Filed Weights   07/20/15 0611 07/21/15 0505 07/22/15 0500  Weight: 79.289 kg (174 lb 12.8 oz) 79.697 kg (175 lb 11.2 oz) 72.9 kg (160 lb 11.5 oz)    Intake/Output: I/O last 3 completed shifts: In: 486 [Other:130; IV Piggyback:356] Out: 2000 [Other:2000]   Intake/Output this shift:     Physical Exam: General: Ill appearing  Head: Normocephalic, atraumatic. Moist oral mucosal membranes  Eyes: Anicteric  Neck: Supple, trachea midline  Lungs:  Bilateral rhonchi, mild basilar crackles  Heart: S1S2 no rubs  Abdomen:  Soft, nontender, BS present   Extremities: 1-2+ peripheral edema.  Neurologic: Arousable, able to communicate and follow commands  Skin: No acute lesions  Access: PD catheter in place, RIJ permcath    Basic Metabolic Panel:  Recent Labs Lab 07/18/15 0657 07/19/15 0433 07/20/15 0500 07/21/15 0526 07/22/15 0434  NA 125* 131* 131* 129* 137  K 2.7* 3.0* 3.6 3.8 2.9*  CL 91* 97* 95* 96* 99*  CO2 GLUCOSE 88 64* 77 119* 92  BUN 26* 34* 39* 43* 20  CREATININE 2.62* 2.93* 3.39* 3.81* 2.42*  CALCIUM 8.8* 8.4* 8.1* 8.0* 7.9*  MG 2.1  --   --   --   --     Liver Function Tests:  Recent Labs Lab 07/19/15 0433  AST 21  ALT 12*  ALKPHOS 82  BILITOT 0.8  PROT 4.6*  ALBUMIN 1.9*   No results for input(s): LIPASE, AMYLASE in the last  168 hours. No results for input(s): AMMONIA in the last 168 hours.  CBC:  Recent Labs Lab 07/17/15 0351 07/18/15 0657 07/19/15 0433 07/20/15 0500 07/21/15 0526  WBC 7.1 9.0 7.8 7.7 7.2  HGB 12.2 12.0 11.3* 10.8* 10.9*  HCT 38.5 37.5 34.6* 34.4* 34.5*  MCV 95.9 95.9 96.1 96.1 95.8  PLT 252 233 206 211 194    Cardiac Enzymes:  Recent Labs Lab 07/17/15 0351  TROPONINI <0.03    BNP: Invalid input(s): POCBNP  CBG:  Recent Labs Lab 07/21/15 1646 07/21/15 1926 07/22/15 0057 07/22/15 0427 07/22/15 0730  GLUCAP 104* 91 90 88 86    Microbiology: Results for orders placed or performed during the hospital encounter of 07/17/15  Blood culture (routine x 2)     Status: None (Preliminary result)   Collection Time: 07/17/15  4:02 AM  Result Value Ref Range Status   Specimen Description BLOOD RIGHT HAND  Final   Special Requests BOTTLES DRAWN AEROBIC AND ANAEROBIC  Final   Culture NO GROWTH 4 DAYS  Final   Report Status PENDING  Incomplete  Blood culture (routine x 2)     Status: None   Collection Time: 07/17/15  4:02 AM  Result Value Ref Range Status   Specimen Description BLOOD LEFT ANTECUBITAL  Final   Special Requests BOTTLES DRAWN AEROBIC AND ANAEROBIC 15ML  Final   Culture  Setup Time   Final    GRAM POSITIVE COCCI IN CLUSTERS AEROBIC BOTTLE ONLY CRITICAL RESULT CALLED TO, READ BACK BY AND VERIFIED WITH: MARCELLA TURNER AT 2253 07/17/2015 BY TFK CONFIRMED BY MLZ    Culture   Final    COAGULASE NEGATIVE STAPHYLOCOCCUS AEROBIC BOTTLE ONLY Results consistent with contamination.    Report Status 07/22/2015 FINAL  Final  MRSA PCR Screening     Status: Abnormal   Collection Time: 07/17/15  6:51 AM  Result Value Ref Range Status   MRSA by PCR POSITIVE (A) NEGATIVE Final    Comment:        The GeneXpert MRSA Assay (FDA approved for NASAL specimens only), is one component of a comprehensive MRSA colonization surveillance program. It is not intended to  diagnose MRSA infection nor to guide or monitor treatment for MRSA infections. CRITICAL RESULT CALLED TO, READ BACK BY AND VERIFIED WITH: Yehuda SavannahLauren Hobbs @ 07/16/16 by [facility]   Culture, sputum-assessment     Status: None   Collection Time: 07/17/15  5:44 PM  Result Value Ref Range Status   Specimen Description SPUTUM  Final   Special Requests NONE  Final   Sputum evaluation   Final    Sputum specimen not acceptable for testing.  Please recollect.   CALLED TO MARCELLA TURNER @ 2009 ON 07/17/2015 BY CAF   Report Status 07/17/2015 FINAL  Final  Culture, blood (single)     Status: None (Preliminary result)   Collection Time: 07/18/15 10:02 AM  Result Value Ref Range Status   Specimen Description BLOOD LEFT HAND  Final   Special Requests BOTTLES DRAWN AEROBIC AND ANAEROBIC  2CC  Final   Culture NO GROWTH 3 DAYS  Final   Report Status PENDING  Incomplete    Coagulation Studies: No results for input(s): LABPROT, INR in the last 72 hours.  Urinalysis: No results for input(s): COLORURINE, LABSPEC, PHURINE, GLUCOSEU, HGBUR, BILIRUBINUR, KETONESUR, PROTEINUR, UROBILINOGEN, NITRITE, LEUKOCYTESUR in the last 72 hours.  Invalid input(s): APPERANCEUR    Imaging: Dg Chest Port 1 View  07/21/2015  CLINICAL DATA:  Respiratory failure.  Subsequent encounter. EXAM: PORTABLE CHEST 1 VIEW COMPARISON:  07/19/2015 FINDINGS: Pulmonary edema has improved as has vascular congestion. There is persistent opacity at the lung bases most likely combination of atelectasis and pleural fluid. Pneumonia is possible. Cardiac silhouette is mostly obscured. Appears mildly enlarged. No mediastinal or hilar masses. Right internal jugular dual-lumen central venous line is stable. IMPRESSION: 1. Improved lung aeration since the prior study with essential resolution of the pulmonary edema. 2. There is persistent lung base opacity most likely combination of atelectasis and pleural fluid. Consider pneumonia if there are consistent  clinical symptoms. Electronically Signed   By: Amie Portlandavid  Ormond M.D.   On: 07/21/2015 19:07     Medications:     . amLODipine  10 mg Oral QPM  . antiseptic oral rinse  7 mL Mouth Rinse BID  . aspirin EC  81 mg Oral Daily  . carvedilol  12.5 mg Oral Q12H  . cloNIDine  0.1 mg Oral TID  . cyanocobalamin  500 mcg Oral Daily  . dextrose  1 ampule Intravenous Once  . dialysis solution 1.5% low-MG/low-CA   Intraperitoneal Q24H  . docusate sodium  100 mg Oral QODAY  . fentaNYL  25 mcg Transdermal Q72H  . fluticasone  2 spray Each Nare Daily  .  furosemide  40 mg Oral Daily  . gentamicin cream  1 application Topical Daily  . heparin  5,000 Units Subcutaneous 3 times per day  . hydrALAZINE  100 mg Oral TID  . latanoprost  1 drop Both Eyes QHS  . lidocaine  1 patch Transdermal Q24H  . loratadine  10 mg Oral Daily  . mirtazapine  15 mg Oral QHS  . multivitamin-lutein  1 capsule Oral BID  . mupirocin ointment  1 application Nasal BID  . pantoprazole (PROTONIX) IV  40 mg Intravenous Q12H  . phenytoin (DILANTIN) IV  300 mg Intravenous QHS  . piperacillin-tazobactam (ZOSYN)  IV  3.375 g Intravenous Q12H  . predniSONE  10 mg Oral Daily  . ascorbic acid  500 mg Oral Daily   acetaminophen, albuterol, hydrALAZINE, lactulose, morphine injection, nitroGLYCERIN, ondansetron **OR** ondansetron (ZOFRAN) IV  Assessment/ Plan:  81 y.o. female with past medical history of hypertension, obstructive sleep apnea, DJD, CVA with residual left sided weakness, carotid stenosis status post L CEA 2/15, seizure disorder, diastolic congestive heart failure, anemia  [facility] PD [facility]  1. End Stage Renal Disease on PD: Patient's daughter had started PD training as outpatient but got only 2 out of 8 days.   emergent HD last night for pulm edema. 2500 cc removed. Breathing is better today At the present time, issue is to r/o aspiration. Speech path eval is pending. Patient appears to continue to want aggressive  care as she is aware that quitting dialysis would mean hospice and ddeath in near future.  Plan: Speech eval today.  HD tomorrow and then hopefulle back on schedule Patient's family want to take her home when discharged and continue PD training at home.  2. Hypertension: Allergy to enalapril (causes anaphylaxis). Blood pressures now at goal.  - home regimen of amlodipine, carvedilol, clonidine, furosemide, hydralazine.   3. Secondary hyperparathyroidism. Phos and calcium low outpatient due to poor nutritional status. Not currently on binders.   4. Anemia chronic kidney disease: Hemoglobin 10.9 - low dose epo with HD.   5.  Bacterial pneumonia/respiratory failure:  Suspect aspiration.  Abx continued  6. Hypokalemia - will replace iv x 1  7. Palliative care team following  8. Acute pulm edema - now off of BiPAP - 2500 cc removed with HD last night     LOS: 5 Kristina Wilson 12/19/20169:00 AM

## 2015-07-22 NOTE — Progress Notes (Signed)
Received a critical lab value from Lab: K+- 2.9, spoke w/ Dr. Dellie CatholicSommers from [facility] who is deferring to nephrology since the pt. Was dialyzed 12/18.

## 2015-07-22 NOTE — Progress Notes (Signed)
[facility] Physician-Brief Progress Note Patient Name: Kristina Wilson DOB: [birth year] MRN: [id number]   Date of Service  07/22/2015  HPI/Events of Note  Multiple issues: 1. Patient on room air study for 30 minutes and sats = 84% - 86% and 2. Patient chokes on her Dilantin pills.   eICU Interventions  Will order: 1. If we have enough evidence for her to quality for home CPAP/BiPAP, she can go back on [place] O2. 2.Will change Dilantin PO to Dilantin IV.      Intervention Category Intermediate Interventions: Respiratory distress - evaluation and management  Sommer,Steven Eugene 07/22/2015, 11:37 PM

## 2015-07-22 NOTE — Care Management (Signed)
Results for Kristina Wilson, Kristina Wilson (MRN [id number]) as of 07/22/2015 15:31  Ref. Range 07/19/2015 11:35  Sample type Unknown ARTERIAL DRAW  Delivery systems Unknown NASAL CANNULA  FIO2 Unknown 0.28  pH, Arterial Latest Ref Range: 7.350-7.450  7.31 (L)  pCO2 arterial Latest Ref Range: 32.0-48.0 mmHg 57 (H)  pO2, Arterial Latest Ref Range: 83.0-108.0 mmHg 77 (L)  Bicarbonate Latest Ref Range: 21.0-28.0 mEq/L 28.7 (H)  Acid-Base Excess Latest Ref Range: 0.0-3.0 mmol/L 1.4  O2 Saturation Latest Units: % 93.9  Patient temperature Unknown 37.0  Collection site Unknown RIGHT BRACHIAL  Allens test (pass/fail) Latest Ref Range: PASS  POSITIVE (A)

## 2015-07-22 NOTE — Progress Notes (Signed)
Called [facility] to let them know about the potassium of 3 that needs to be replaced.

## 2015-07-22 NOTE — Progress Notes (Signed)
[facility] [place] [facility]   PATIENT NAME: Kristina GoltzJacquelyn Wilson    MR#:  [id number]  DATE OF BIRTH:  1922/12/14  SUBJECTIVE:  CHIEF COMPLAINT:   Chief Complaint  Patient presents with  . Shortness of Breath   - alert this morning, arousable and saying that she cannot hear properly - Feels like she cannot swallow good. Hypoglycemic due to poor intake and NPO status - swallow eval pending. - peritoneal dialysis hasnt been done since 07/12/15  REVIEW OF SYSTEMS:  Review of Systems  Unable to perform ROS: mental acuity    DRUG ALLERGIES:   Allergies  Allergen Reactions  . Enalapril Anaphylaxis  . Tramadol Shortness Of Breath  . Capsaicin Other (See Comments)    Reaction:  Unknown   . Codeine Other (See Comments)    Reaction:  Unknown   . Hydrocodone Other (See Comments)    Reaction:  Unknown   . Vicodin [Hydrocodone-Acetaminophen] Other (See Comments)    Reaction:  Unknown     VITALS:  Blood pressure 121/45, pulse 78, temperature 98.6 F (37 C), temperature source Axillary, resp. rate 13, height 5\' 6"  (1.676 m), weight 72.9 kg (160 lb 11.5 oz), SpO2 96 %.  PHYSICAL EXAMINATION:  Physical Exam  GENERAL:  81 y.o.-year-old patient lying in the bed with no acute distress.  EYES: Pupils equal, round, reactive to light and accommodation. No scleral icterus. Extraocular muscles intact.  HEENT: Head atraumatic, normocephalic. Oropharynx and nasopharynx clear.  NECK:  Supple, no jugular venous distention. No thyroid enlargement, no tenderness.  LUNGS: Normal breath sounds bilaterally, decreased bibasilar breath sounds, crackles heard diffusely,  no wheezing, or rhonchi. No use of accessory muscles of respiration.  CARDIOVASCULAR: S1, S2 normal. No rubs, or gallops. 3/6 systolic murmur present ABDOMEN: Soft, nontender, nondistended. Bowel sounds present. No organomegaly or mass.  EXTREMITIES: No cyanosis, or clubbing. 1+ pedal edema  present Complains of tenderness when her legs are being examined NEUROLOGIC: patient is alert, not following commands. Cranial nerves II through XII are intact. No focal deficits noted. Sensation intact. Gait not checked.  PSYCHIATRIC: The patient is sleepy, but easily arousable. Not following commands as she says that she cannot hear anything.  SKIN: No obvious rash, lesion, or ulcer.    LABORATORY PANEL:   CBC  Recent Labs Lab 07/21/15 0526  WBC 7.2  HGB 10.9*  HCT 34.5*  PLT 194   ------------------------------------------------------------------------------------------------------------------  Chemistries   Recent Labs Lab 07/18/15 0657 07/19/15 0433  07/22/15 0434  NA 125* 131*  < > 137  K 2.7* 3.0*  < > 2.9*  CL 91* 97*  < > 99*  CO2 27 26  < > 27  GLUCOSE 88 64*  < > 92  BUN 26* 34*  < > 20  CREATININE 2.62* 2.93*  < > 2.42*  CALCIUM 8.8* 8.4*  < > 7.9*  MG 2.1  --   --   --   AST  --  21  --   --   ALT  --  12*  --   --   ALKPHOS  --  82  --   --   BILITOT  --  0.8  --   --   < > = values in this interval not displayed. ------------------------------------------------------------------------------------------------------------------  Cardiac Enzymes  Recent Labs Lab 07/17/15 0351  TROPONINI <0.03   ------------------------------------------------------------------------------------------------------------------  RADIOLOGY:  Dg Chest Port 1 View  07/21/2015  CLINICAL DATA:  Respiratory failure.  Subsequent  encounter. EXAM: PORTABLE CHEST 1 VIEW COMPARISON:  07/19/2015 FINDINGS: Pulmonary edema has improved as has vascular congestion. There is persistent opacity at the lung bases most likely combination of atelectasis and pleural fluid. Pneumonia is possible. Cardiac silhouette is mostly obscured. Appears mildly enlarged. No mediastinal or hilar masses. Right internal jugular dual-lumen central venous line is stable. IMPRESSION: 1. Improved lung aeration  since the prior study with essential resolution of the pulmonary edema. 2. There is persistent lung base opacity most likely combination of atelectasis and pleural fluid. Consider pneumonia if there are consistent clinical symptoms. Electronically Signed   By: Amie Portland M.D.   On: 07/21/2015 19:07    EKG:   Orders placed or performed during the hospital encounter of 07/17/15  . ED EKG  . ED EKG  . EKG 12-Lead  . EKG 12-Lead    ASSESSMENT AND PLAN:   Principal Problem:  Aspiration pneumonia (HCC) Active Problems:  CKD (chronic kidney disease), stage IV (HCC)  Pressure ulcer  Chronic respiratory failure (HCC)  HTN (hypertension), benign  * aspiration Vs HCAP pneumonia- causing acute on chronic respiratory distress.  Associated sepsis - present on admission- with altered mental status. vanc discontinued as blood cultures negative, on zosyn  Swallow evaluation today  appreciate pulm consult - f/u CXR tomorrow  * Bacteremia  Suspected, but the blood culture reported to be contamination, and repeat culture remained negative. - On zosyn  * Chronic respiratory failure  On oxygen, continue. Now weaned to 3L nasal cannula - CXR with pulm vascular congestion- Improved breathing with dialysis - patient also has underlying COPD with frequent hypercarbia and change in mental status requiring Bipap - Will benefit from noninvasive positive pressure ventilation at home. Cannot tolerate sleep study or CPAP.  - will get room air ABG and nocturnal oxymetry  * ESRD  on PD- nephro consult. Hemodialysis last night due to worsening pulmonary edema. -Has peritoneal dialysis catheter and family want to continue to do PD as outpatient Also has a right chest permacath for HD in hospital if needed - appreciate nephrology consult,  -Patient and family want to continue dialysis at this point  * Htn  On Coreg, clonidine and Lasix Also on oral hydralazine.  * CVA Hx  Bedbound- is  total care.  Family do not want any rehabilitation. They want to take her home.  * Hx of seizure disorder  On dilantin  * DVT Prophylaxis- SQ Heparin   Cont to monitor, physical therapy consult when able to Overall poor prognosis.   All the records are reviewed and case discussed with Care Management/Social Workerr. Management plans discussed with the patient, family and they are in agreement.  CODE STATUS: DNR  TOTAL TIME TAKING CARE OF THIS PATIENT: 37 minutes.   POSSIBLE D/C IN 3 DAYS, DEPENDING ON CLINICAL CONDITION.   Enid Baas M.D on 07/22/2015 at 3:13 PM  Between 7am to 6pm - Pager - [phone]  After 6pm go to [email or web address] - password [email or web address] [facility]  [facility]  Office  [phone]  CC: Primary care physician; Jaclyn Shaggy, MD

## 2015-07-22 NOTE — Progress Notes (Signed)
Nutrition Follow-up  DOCUMENTATION CODES:      INTERVENTION:  Coordination of care: Await SLP recommendations and nutrition poc   NUTRITION DIAGNOSIS:   Inadequate oral intake related to inability to eat as evidenced by NPO status.    GOAL:   Patient will meet greater than or equal to 90% of their needs    MONITOR:    (Energy Intake, Electrolyte and renal Profile, Anthropometrics)  REASON FOR ASSESSMENT:   Diagnosis, Malnutrition Screening Tool (Renal Diet)    ASSESSMENT:   Pt admitted with difficulty breathing with possible aspiration pna. Per H&P pt resided at [facility] for the past month and a half receiving HD. Pt discharged from [facility] with the intent of using PD at home. Pt on isolation currently for MRSA. SLP following.  Pt transferred to ICU placed on bipap and emergent HD performed last night.  Currently on 4 liters nasal canula. Palliative care to meet with family today   Current Nutrition: NPO (day 5)   Gastrointestinal Profile: Last BM: 12/19   Scheduled Medications:  . amLODipine  10 mg Oral QPM  . antiseptic oral rinse  7 mL Mouth Rinse BID  . aspirin EC  81 mg Oral Daily  . carvedilol  12.5 mg Oral Q12H  . cloNIDine  0.1 mg Oral TID  . cyanocobalamin  500 mcg Oral Daily  . dextrose  1 ampule Intravenous Once  . dialysis solution 1.5% low-MG/low-CA   Intraperitoneal Q24H  . docusate sodium  100 mg Oral QODAY  . fentaNYL  25 mcg Transdermal Q72H  . fluticasone  2 spray Each Nare Daily  . furosemide  40 mg Oral Daily  . gentamicin cream  1 application Topical Daily  . heparin  5,000 Units Subcutaneous 3 times per day  . hydrALAZINE  100 mg Oral TID  . latanoprost  1 drop Both Eyes QHS  . lidocaine  1 patch Transdermal Q24H  . loratadine  10 mg Oral Daily  . mirtazapine  15 mg Oral QHS  . multivitamin-lutein  1 capsule Oral BID  . pantoprazole (PROTONIX) IV  40 mg Intravenous Q12H  . phenytoin (DILANTIN) IV  300 mg  Intravenous QHS  . piperacillin-tazobactam (ZOSYN)  IV  3.375 g Intravenous Q12H  . predniSONE  10 mg Oral Daily  . ascorbic acid  500 mg Oral Daily       Electrolyte/Renal Profile and Glucose Profile:   Recent Labs Lab 07/18/15 0657  07/20/15 0500 07/21/15 0526 07/22/15 0434  NA 125*  < > 131* 129* 137  K 2.7*  < > 3.6 3.8 2.9*  CL 91*  < > 95* 96* 99*  CO2 27  < > BUN 26*  < > 39* 43* 20  CREATININE 2.62*  < > 3.39* 3.81* 2.42*  CALCIUM 8.8*  < > 8.1* 8.0* 7.9*  MG 2.1  --   --   --   --   GLUCOSE 88  < > 77 119* 92  < > = values in this interval not displayed. Protein Profile:  Recent Labs Lab 07/19/15 0433  ALBUMIN 1.9*      Weight Trend since Admission: Filed Weights   07/20/15 0611 07/21/15 0505 07/22/15 0500  Weight: 174 lb 12.8 oz (79.289 kg) 175 lb 11.2 oz (79.697 kg) 160 lb 11.5 oz (72.9 kg)      Diet Order:  Diet NPO time specified  Skin:   (Stage III coccyx pressure ulcer)  Last BM:  unknown  Height:   Ht Readings from Last 1 Encounters:  07/18/15 5\' 6"  (1.676 m)    Weight:   Wt Readings from Last 1 Encounters:  07/22/15 160 lb 11.5 oz (72.9 kg)    Ideal Body Weight:     BMI:  Body mass index is 25.95 kg/(m^2).  Estimated Nutritional Needs:   Kcal:  BEE: 1027kcals, TEE: (IF 1.2-1.4)(AF 1.2) 1478-1725kcals, using IBW of 59kg  Protein:  70-89g protein (1.2-1.5g/kg) using IBW of 59kg  Fluid:  UOP+1L  EDUCATION NEEDS:   No education needs identified at this time  HIGH Care Level  Sheleen Conchas B. Freida BusmanAllen, RD, LDN [phone] (pager)

## 2015-07-22 NOTE — Care Management (Signed)
Patient has remained in ICU and required emergent hemodialysis last pm.  Discussed during progression that if patient is to continue agressive treatment measures (She is DNR) she would have to be able to sit for her hemodialysis.  Palliative care consult is pending.  It is documented that patient has informed nephrology that she wished to pursue aggressive treatment.  It is reported patient has told others that she does not wish to have aggressive treatment.  Spoke with patient's daughter Tresa EndoKelly- primary caregiver.  She fully intends that patient will discharge home and under no circumstances will go to a skilled facility.  She will discharge back home with resumption of home health services through [facility].  At last discharge this CM initiated a trilogy referral but the patient did not discharge home so referral was not processed.  Agency preference is [facility] .  Updated attending on documentation requirements for the referral.  Also discussed with attending that if patient requires hemodialysis during and after the transition home, she will have to be able to sit for the treatment.   At present anticipate that another attempt will be made to educate caregivers on peritoneal dialysis

## 2015-07-22 NOTE — Evaluation (Signed)
Clinical/Bedside Swallow Evaluation Patient Details  Name:Kristina Wilson MRN: [id number] Date of Birth: Apr 20, 1923  Today's Date: 07/22/2015 Time: SLP Start Time (ACUTE ONLY): 1245 SLP Stop Time (ACUTE ONLY): 1345 SLP Time Calculation (min) (ACUTE ONLY): 60 min  Past Medical History:  Past Medical History  Diagnosis Date  . Muscle weakness   . Chronic respiratory failure (HCC)   . Pneumonia   . Sleep apnea   . CHF (congestive heart failure) (HCC)   . CKD (chronic kidney disease), stage IV (HCC)   . Dysphagia   . GERD (gastroesophageal reflux disease)   . Back pain   . Epilepsy (HCC)   . Hypertension   . Stroke (HCC)   . Headache   . Arthritis   . Anemia     iron deficiency anemia  . Complication of anesthesia     difficult waking up after surgery (2013)   Past Surgical History:  Past Surgical History  Procedure Laterality Date  . Back surgery    . Knee surgery    . Carotid endarterectomy    . Tonsillectomy    . Appendectomy    . Joint replacement Bilateral     Knee Replacements  . Dilation and curettage of uterus    . Breast surgery Right     Breast Biopsy  . Capd insertion N/A 04/12/2015    Procedure: LAPAROSCOPIC INSERTION CONTINUOUS AMBULATORY PERITONEAL DIALYSIS  (CAPD) CATHETER;  Surgeon: [name], MD;  Location: [facility] ORS;  Service: Vascular;  Laterality: N/A;  . Peripheral vascular catheterization N/A 05/16/2015    Procedure: Dialysis/Perma Catheter Insertion;  Surgeon: Annice Needy, MD;  Location: [facility] INVASIVE CV LAB;  Service: Cardiovascular;  Laterality: N/A;   HPI:  Pt is a 81 y.o. female with a known history of end-stage renal disease on peritoneal dialysis, chronic respiratory failure on home oxygen, hypertension, CVA, history of congestive heart failure, epilepsy, chronic anemia, bedbound, on total care dependent, on DO NOT RESUSCITATE status was brought in with the complaints of cough and congestion with shortness of breath following episode  of emesis at around 3 AM today. According to the patient's daughter who is her care giver at home, she has been having ongoing nausea for the past few days and she had an episode of emesis following which she developed coughing spell with shortness of breath and hypoxia, hence brought to the emergency room. Patient was recently discharged from [facility] to home and was initiated on peritoneal dialysis 2 days ago. Prior to that she was on hemodialysis for the past one and half months while she was at [facility]. She does have a history of chronic respiratory failure and uses home oxygen 2 and half liters. Pt awakened for SLP; extremently HOH. She answered only a few basic questions and attempted to follow commands but did not interact much. Suspect d/t her severe HOH status. She was able to indicate her wants/needs re: foods and drink. Noted mild belching and increased time cleaning her mouth b/t trials.   Assessment / Plan / Recommendation Clinical Impression  Pt seen for a f/u evaluation sec. to her change in status and transfer to CCU. Pt was given trials of po's w/ Dtr. present. Pt appears to present w/ mild increased risk for aspiration sec. to her declined medical and respiratory status' currently. Pt was able to adequately manage, and appeared to tolerate, trials of thin liquids via cup w/ assistance feeding; no overt s/s of aspiration were noted and clear  vocal quality was noted b/t trials. SImilar toleration was noted w/ trials of a softened food(Dys. 2 consistency). Pt required increased oral phase time for mastication then clearing w/ trials - pt required time for lingual sweeping but suspect this could be related to her Cognitive status and need for attention to the oral clearing. During all trials, no immediate s/s of aspiration were noted w/ no decline in O2 sats or RR/status. Pt required assistance w/ all trials. Rec. a Dys. 2 diet w/ thin liquids via CUP at this time; STRICT aspiration  precautions and feeding assistance w/ po's. Unsure of pt's GI status and nature of belching and episodes of N/V, but pt is at increased risk for aspiration of emesis/vomit; suspect pt may have aspirated emesis post N/V leading to admission last week per Dtr's description. Pt is also at risk d/t the quick fatigued w/ any exertion. ST will f/u w/ toleration of diet. MD updated and agreed. NSG updated. Dtr present and agreed.    Aspiration Risk  Mild aspiration risk    Diet Recommendation  Dys. 2 w/ thin liquids; strict aspiration precautions; feeding assistance at meals. Consider using dysphagia drink cup to aid in monitoring amount of thin liquids when drinking.   Medication Administration: Crushed with puree    Other  Recommendations Recommended Consults: Consider GI evaluation Oral Care Recommendations: Oral care BID;Staff/trained caregiver to provide oral care   Follow up Recommendations  Skilled Nursing facility    Frequency and Duration min 3x week  1 week       Prognosis Prognosis for Safe Diet Advancement: Fair Barriers to Reach Goals: Cognitive deficits (medical and respiratory status')      Swallow Study   General Date of Onset: 07/17/15 HPI: Pt is a 81 y.o. female with a known history of end-stage renal disease on peritoneal dialysis, chronic respiratory failure on home oxygen, hypertension, CVA, history of congestive heart failure, epilepsy, chronic anemia, bedbound, on total care dependent, on DO NOT RESUSCITATE status was brought in with the complaints of cough and congestion with shortness of breath following episode of emesis at around 3 AM today. According to the patient's daughter who is her care giver at home, she has been having ongoing nausea for the past few days and she had an episode of emesis following which she developed coughing spell with shortness of breath and hypoxia, hence brought to the emergency room. Patient was recently discharged from [facility] to  home and was initiated on peritoneal dialysis 2 days ago. Prior to that she was on hemodialysis for the past one and half months while she was at [facility]. She does have a history of chronic respiratory failure and uses home oxygen 2 and half liters. Pt awakened for SLP; extremently HOH. She answered only a few basic questions and attempted to follow commands but did not interact much. Suspect d/t her severe HOH status. She was able to indicate her wants/needs re: foods and drink. Noted mild belching and increased time cleaning her mouth b/t trials. Type of Study: Bedside Swallow Evaluation (pt was seen for a reassessment sec. to change in status) Previous Swallow Assessment: at initial admission; pt was rec'd NPO status d/t medical status Diet Prior to this Study: NPO Temperature Spikes Noted: No (wbc: 7.7, 7.2; ) Respiratory Status: Nasal cannula (4 liters) History of Recent Intubation: No Behavior/Cognition: Cooperative;Pleasant mood;Confused;Requires cueing (awake) Oral Cavity Assessment: Dry;Dried secretions Oral Care Completed by SLP: Yes Oral Cavity - Dentition: Missing dentition Vision:  (  n/a) Self-Feeding Abilities: Total assist;Needs assist Patient Positioning: Upright in bed Baseline Vocal Quality: Low vocal intensity Volitional Cough: Weak Volitional Swallow: Able to elicit    Oral/Motor/Sensory Function Overall Oral Motor/Sensory Function: Within functional limits (noted w/ bolus trials)   Ice Chips Ice chips: Within functional limits Presentation: Spoon (fed; 3 trials) Oral Phase Impairments:  (none) Pharyngeal Phase Impairments:  (none)   Thin Liquid Thin Liquid: Within functional limits Presentation: Cup;Self Fed (assisted w/ trials to monitor; 7 trials) Oral Phase Impairments:  (none) Pharyngeal  Phase Impairments:  (none)    Nectar Thick Nectar Thick Liquid: Not tested   Honey Thick Honey Thick Liquid: Not tested   Puree Puree: Not tested Other Comments: does  not like applesauce   Solid Solid: Impaired Presentation: Spoon (2 trials) Oral Phase Impairments:  (increased oral phase time for mastication) Oral Phase Functional Implications:  (increased oral phase time for mastication/clearing) Pharyngeal Phase Impairments:  (none) Other Comments: Dys. 2 consistency food items      Jerilynn SomKatherine Watson, MS, CCC-SLP  Watson,Katherine 07/22/2015,1:44 PM

## 2015-07-22 NOTE — Progress Notes (Signed)
ANTIBIOTIC CONSULT NOTE - INITIAL  Pharmacy Consult for Zosyn Indication: pneumonia  Allergies  Allergen Reactions  . Enalapril Anaphylaxis  . Tramadol Shortness Of Breath  . Capsaicin Other (See Comments)    Reaction:  Unknown   . Codeine Other (See Comments)    Reaction:  Unknown   . Hydrocodone Other (See Comments)    Reaction:  Unknown   . Vicodin [Hydrocodone-Acetaminophen] Other (See Comments)    Reaction:  Unknown     Patient Measurements: Height:  (167.6 cm) Weight: 160 lb 11.5 oz (72.9 kg) IBW/kg (Calculated) : 59.3 Adjusted Body Weight: 67 kg  Vital Signs: Temp: 98.3 F (36.8 C) (12/19 0800) Temp Source: Axillary (12/19 0800) BP: 113/47 mmHg (12/19 0800) Pulse Rate: 79 (12/19 0800) Intake/Output from previous day: 12/18 0701 - 12/19 0700 In: 336 [IV Piggyback:206] Out: 2000  Intake/Output from this shift:    Labs:  Recent Labs  07/20/15 0500 07/21/15 0526 07/22/15 0434  WBC 7.7 7.2  --   HGB 10.8* 10.9*  --   PLT 211 194  --   CREATININE 3.39* 3.81* 2.42*   Estimated Creatinine Clearance: 15.8 mL/min (by C-G formula based on Cr of 2.42). No results for input(s): VANCOTROUGH, VANCOPEAK, VANCORANDOM, GENTTROUGH, GENTPEAK, GENTRANDOM, TOBRATROUGH, TOBRAPEAK, TOBRARND, AMIKACINPEAK, AMIKACINTROU, AMIKACIN in the last 72 hours.   Microbiology: Recent Results (from the past 720 hour(s))  Blood culture (routine x 2)     Status: None   Collection Time: 07/17/15  4:02 AM  Result Value Ref Range Status   Specimen Description BLOOD RIGHT HAND  Final   Special Requests BOTTLES DRAWN AEROBIC AND ANAEROBIC  Final   Culture NO GROWTH 5 DAYS  Final   Report Status 07/22/2015 FINAL  Final  Blood culture (routine x 2)     Status: None   Collection Time: 07/17/15  4:02 AM  Result Value Ref Range Status   Specimen Description BLOOD LEFT ANTECUBITAL  Final   Special Requests BOTTLES DRAWN AEROBIC AND ANAEROBIC  Final   Culture  Setup Time   Final     GRAM POSITIVE COCCI IN CLUSTERS AEROBIC BOTTLE ONLY CRITICAL RESULT CALLED TO, READ BACK BY AND VERIFIED WITH: MARCELLA TURNER AT 2253 07/17/2015 BY TFK CONFIRMED BY MLZ    Culture   Final    COAGULASE NEGATIVE STAPHYLOCOCCUS AEROBIC BOTTLE ONLY Results consistent with contamination.    Report Status 07/22/2015 FINAL  Final  MRSA PCR Screening     Status: Abnormal   Collection Time: 07/17/15  6:51 AM  Result Value Ref Range Status   MRSA by PCR POSITIVE (A) NEGATIVE Final    Comment:        The GeneXpert MRSA Assay (FDA approved for NASAL specimens only), is one component of a comprehensive MRSA colonization surveillance program. It is not intended to diagnose MRSA infection nor to guide or monitor treatment for MRSA infections. CRITICAL RESULT CALLED TO, READ BACK BY AND VERIFIED WITH: Yehuda Savannah @ 07/16/16 by [facility]   Culture, sputum-assessment     Status: None   Collection Time: 07/17/15  5:44 PM  Result Value Ref Range Status   Specimen Description SPUTUM  Final   Special Requests NONE  Final   Sputum evaluation   Final    Sputum specimen not acceptable for testing.  Please recollect.   CALLED TO MARCELLA TURNER @ 2009 ON 07/17/2015 BY CAF   Report Status 07/17/2015 FINAL  Final  Culture, blood (single)  Status: None (Preliminary result)   Collection Time: 07/18/15 10:02 AM  Result Value Ref Range Status   Specimen Description BLOOD LEFT HAND  Final   Special Requests BOTTLES DRAWN AEROBIC AND ANAEROBIC  2CC  Final   Culture NO GROWTH 4 DAYS  Final   Report Status PENDING  Incomplete    Medical History: Past Medical History  Diagnosis Date  . Muscle weakness   . Chronic respiratory failure (HCC)   . Pneumonia   . Sleep apnea   . CHF (congestive heart failure) (HCC)   . CKD (chronic kidney disease), stage IV (HCC)   . Dysphagia   . GERD (gastroesophageal reflux disease)   . Back pain   . Epilepsy (HCC)   . Hypertension   . Stroke (HCC)   . Headache    . Arthritis   . Anemia     iron deficiency anemia  . Complication of anesthesia     difficult waking up after surgery (2013)    Medications:  Scheduled:  . amLODipine  10 mg Oral QPM  . antiseptic oral rinse  7 mL Mouth Rinse BID  . aspirin EC  81 mg Oral Daily  . carvedilol  12.5 mg Oral Q12H  . cloNIDine  0.1 mg Oral TID  . cyanocobalamin  500 mcg Oral Daily  . dextrose  1 ampule Intravenous Once  . dialysis solution 1.5% low-MG/low-CA   Intraperitoneal Q24H  . docusate sodium  100 mg Oral QODAY  . fentaNYL  25 mcg Transdermal Q72H  . fluticasone  2 spray Each Nare Daily  . furosemide  40 mg Oral Daily  . gentamicin cream  1 application Topical Daily  . heparin  5,000 Units Subcutaneous 3 times per day  . hydrALAZINE  100 mg Oral TID  . latanoprost  1 drop Both Eyes QHS  . lidocaine  1 patch Transdermal Q24H  . loratadine  10 mg Oral Daily  . mirtazapine  15 mg Oral QHS  . multivitamin-lutein  1 capsule Oral BID  . pantoprazole (PROTONIX) IV  40 mg Intravenous Q12H  . phenytoin (DILANTIN) IV  300 mg Intravenous QHS  . piperacillin-tazobactam (ZOSYN)  IV  3.375 g Intravenous Q12H  . potassium chloride  10 mEq Intravenous Once  . predniSONE  10 mg Oral Daily  . ascorbic acid  500 mg Oral Daily   Assessment: Patient is a 81 yo female with orders for Vancomycin and Zosyn for empiric treatment of pneumonia.  Per notes, patient has ESRD requiring peritoneal dialysis. Vancomycin d/c 12/16. Patient now receiving HD.   Plan:  Will continue Zosyn 3.375 gm IV q12h.   Pharmacy will continue to follow.   Follow up culture results  Luisa Harthristy, Milton Sagona D 07/22/2015,11:23 AM

## 2015-07-22 NOTE — Progress Notes (Signed)
Visit made. Patient transferred back to ICU on 12/18 for decreased oxygen saturations requiring her to be placed back on Bipap as ABG results showed pCO2 of 51. Per her daughter Tresa EndoKelly, who was present during writer's visit, she noticed a mental status change in her mother and requested the lab be done. She is currently weaned back  to 4 liters of oxygen via nasal cannula. Hemodialysis performed yesterday 12/18 with plans for another today. Per chart note review and conversation with patient's daughter Tresa EndoKelly, patient is awaiting a swallow evaluation. Tresa EndoKelly feels that the results of the evaluation will determine her next step. She requested to speak with Palliative Care physician Dr. Orvan Falconerampbell, who has been made aware.  Of note patient roused easily to voice when spoken to in her ear, she hears better from her right ear per Tresa EndoKelly. Patient appeared comfortable. Will continue to follow through final disposition. Thank you. Dayna BarkerKaren Robertson RN, BSN, [facility], hospital liaison [phone] c

## 2015-07-23 ENCOUNTER — Inpatient Hospital Stay: Payer: Medicare Other

## 2015-07-23 LAB — CBC
HCT: 31.5 % — ABNORMAL LOW (ref 35.0–47.0)
Hemoglobin: 10.1 g/dL — ABNORMAL LOW (ref 12.0–16.0)
MCH: 30.3 pg (ref 26.0–34.0)
MCHC: 32.2 g/dL (ref 32.0–36.0)
MCV: 94.2 fL (ref 80.0–100.0)
PLATELETS: 169 10*3/uL (ref 150–440)
RBC: 3.34 MIL/uL — AB (ref 3.80–5.20)
RDW: 18.3 % — ABNORMAL HIGH (ref 11.5–14.5)
WBC: 5.3 10*3/uL (ref 3.6–11.0)

## 2015-07-23 LAB — BASIC METABOLIC PANEL
Anion gap: 8 (ref 5–15)
BUN: 26 mg/dL — ABNORMAL HIGH (ref 6–20)
CHLORIDE: 101 mmol/L (ref 101–111)
CO2: 27 mmol/L (ref 22–32)
CREATININE: 3.19 mg/dL — AB (ref 0.44–1.00)
Calcium: 8.2 mg/dL — ABNORMAL LOW (ref 8.9–10.3)
GFR calc non Af Amer: 12 mL/min — ABNORMAL LOW (ref >60)
GFR, EST AFRICAN AMERICAN: 14 mL/min — AB (ref >60)
GLUCOSE: 84 mg/dL (ref 65–99)
Potassium: 3 mmol/L — ABNORMAL LOW (ref 3.5–5.1)
Sodium: 136 mmol/L (ref 135–145)

## 2015-07-23 LAB — GLUCOSE, CAPILLARY
GLUCOSE-CAPILLARY: 71 mg/dL (ref 65–99)
GLUCOSE-CAPILLARY: 135 mg/dL — AB (ref 65–99)
Glucose-Capillary: 112 mg/dL — ABNORMAL HIGH (ref 65–99)
Glucose-Capillary: 65 mg/dL (ref 65–99)
Glucose-Capillary: 79 mg/dL (ref 65–99)
Glucose-Capillary: 80 mg/dL (ref 65–99)
Glucose-Capillary: 82 mg/dL (ref 65–99)

## 2015-07-23 LAB — CULTURE, BLOOD (SINGLE): Culture: NO GROWTH

## 2015-07-23 MED ORDER — PHENYLEPHRINE HCL 10 MG/ML IJ SOLN
30.0000 ug/min | INTRAVENOUS | Status: DC
  Administered 2015-07-23: 50 ug/min via INTRAVENOUS
  Administered 2015-07-23 – 2015-07-24 (×2): 30 ug/min via INTRAVENOUS
  Filled 2015-07-23 (×5): qty 1

## 2015-07-23 NOTE — Progress Notes (Signed)
Dialysis nurse called for report on patient, will have dialysis this AM.

## 2015-07-23 NOTE — Progress Notes (Signed)
Pt complaining she can't breathe. Was on 2L and took medications po after dialysis with ST in room. Medications given whole by therapist with RN present to evaluate swallow. Pt did not tolerate and will need medications crushed, as well as thickened liquids. Told ST that pt did not tolerate swallowing with thin liquid or solid foods this AM. Sats dropped to 80s on 2L after medication pass and lunch. O2 up to 4L and was still 86%. Up to 6L got up to 100% slowly. Pt had to be changed for incontinent episode, and sats down to 70s when flat. Now sats at 4L and tried to wean back to 2L. Pt unable to tolerate and complaining she cannot breathe. Patient is currently on 4L, O2 sats at 99% and continues to complain that she cannot breathe. Called [facility] nurse and plan will be to  put pt back on bipap and get CXR. Continue to monitor.

## 2015-07-23 NOTE — Progress Notes (Signed)
Speech Language Pathology Treatment:    Patient Details Name: Kristina Wilson MRN: [id number] DOB: May 10, 1923 Today's Date: 07/23/2015 Time: [id number] SLP Time Calculation (min) (ACUTE ONLY): 55 min  Assessment / Plan / Recommendation Clinical Impression  Pt initially appeared to adequately tolerate trials of puree and TSPs of Nectar consistency liquids when fed slowly using small, 1/2-3/4 tsp bites and alternating b/t foods and liquids. When pt attempted to use the puree to swallow pills one at a time w/ other increased activity in the room, she became too fatigued and then c/o not being able to breath as well. NSG addressed this w/ min. Increase in O2 support via [place]am. Given a rest break and support, pt calmed and O2 sats adjusted to 99-100. Pt had stated "too much" was happening. It appears pt is directly impacted by increased activity and fatigue which could impact her swallowing and safety w/ po's. Due to this and to the morning's NSG report that pt exhibited overt s/s of aspiration w/ thin liquids, rec. a Dys. 1(puree) diet w/ Nectar consistency liquids via TSP w/ same strict aspiration precautions; feeding assistance w/ Rest Breaks; Meds in Puree - crushed as able. Dtr present during session and agreed. NSg updated who will updated MD. ST will f/u tomorrow w/ pt's toleration of an oral diet.    HPI        SLP Plan   continue w/ POC; education w/ family/dtr    Recommendations  Diet recommendations: Dysphagia 1 (puree);Nectar-thick liquid Liquids provided via: Teaspoon Medication Administration: Crushed with puree Supervision: Staff to assist with self feeding;Full supervision/cueing for compensatory strategies;Trained caregiver to feed patient Compensations: Minimize environmental distractions;Slow rate;Small sips/bites;Follow solids with liquid Postural Changes and/or Swallow Maneuvers: Seated upright 90 degrees (rest breaks during meals to avoid Fatigue)              Oral Care  Recommendations: Oral care BID;Staff/trained caregiver to provide oral care Follow up Recommendations:  (TBD)    Jerilynn SomKatherine Dayyan Krist, MS, CCC-SLP  Genesi Stefanko 07/23/2015, 3:29 PM

## 2015-07-23 NOTE — Plan of Care (Signed)
Problem: SLP Dysphagia Goals Goal: Misc Dysphagia Goal Pt will safely tolerate po diet of least restrictive consistency w/ no overt s/s of aspiration noted by Staff/pt/family x3 sessions.    

## 2015-07-23 NOTE — Progress Notes (Signed)
ANTIBIOTIC CONSULT NOTE - INITIAL  Pharmacy Consult for Zosyn Indication: pneumonia  Allergies  Allergen Reactions  . Enalapril Anaphylaxis  . Tramadol Shortness Of Breath  . Capsaicin Other (See Comments)    Reaction:  Unknown   . Codeine Other (See Comments)    Reaction:  Unknown   . Hydrocodone Other (See Comments)    Reaction:  Unknown   . Vicodin [Hydrocodone-Acetaminophen] Other (See Comments)    Reaction:  Unknown     Patient Measurements: Height: 5\' 6"  (167.6 cm) Weight: 161 lb 13.1 oz (73.4 kg) IBW/kg (Calculated) : 59.3 Adjusted Body Weight: 67 kg  Vital Signs: Temp: 97.8 F (36.6 C) (12/20 0945) Temp Source: Temporal (12/20 0945) BP: 135/42 mmHg (12/20 0945) Pulse Rate: 78 (12/20 0900) Intake/Output from previous day: 12/19 0701 - 12/20 0700 In: 206 [IV Piggyback:206] Out: -  Intake/Output from this shift:    Labs:  Recent Labs  07/21/15 0526 07/22/15 0434 07/23/15 0527  WBC 7.2  --  5.3  HGB 10.9*  --  10.1*  PLT 194  --  169  CREATININE 3.81* 2.42* 3.19*   Estimated Creatinine Clearance: 12 mL/min (by C-G formula based on Cr of 3.19). No results for input(s): VANCOTROUGH, VANCOPEAK, VANCORANDOM, GENTTROUGH, GENTPEAK, GENTRANDOM, TOBRATROUGH, TOBRAPEAK, TOBRARND, AMIKACINPEAK, AMIKACINTROU, AMIKACIN in the last 72 hours.   Microbiology: Recent Results (from the past 720 hour(s))  Blood culture (routine x 2)     Status: None   Collection Time: 07/17/15  4:02 AM  Result Value Ref Range Status   Specimen Description BLOOD RIGHT HAND  Final   Special Requests BOTTLES DRAWN AEROBIC AND ANAEROBIC 5ML  Final   Culture NO GROWTH 5 DAYS  Final   Report Status 07/22/2015 FINAL  Final  Blood culture (routine x 2)     Status: None   Collection Time: 07/17/15  4:02 AM  Result Value Ref Range Status   Specimen Description BLOOD LEFT ANTECUBITAL  Final   Special Requests BOTTLES DRAWN AEROBIC AND ANAEROBIC 15ML  Final   Culture  Setup Time   Final     GRAM POSITIVE COCCI IN CLUSTERS AEROBIC BOTTLE ONLY CRITICAL RESULT CALLED TO, READ BACK BY AND VERIFIED WITH: MARCELLA TURNER AT 2253 07/17/2015 BY TFK CONFIRMED BY MLZ    Culture   Final    COAGULASE NEGATIVE STAPHYLOCOCCUS AEROBIC BOTTLE ONLY Results consistent with contamination.    Report Status 07/22/2015 FINAL  Final  MRSA PCR Screening     Status: Abnormal   Collection Time: 07/17/15  6:51 AM  Result Value Ref Range Status   MRSA by PCR POSITIVE (A) NEGATIVE Final    Comment:        The GeneXpert MRSA Assay (FDA approved for NASAL specimens only), is one component of a comprehensive MRSA colonization surveillance program. It is not intended to diagnose MRSA infection nor to guide or monitor treatment for MRSA infections. CRITICAL RESULT CALLED TO, READ BACK BY AND VERIFIED WITH: Yehuda SavannahLauren Hobbs @ 07/16/16 by [facility]   Culture, sputum-assessment     Status: None   Collection Time: 07/17/15  5:44 PM  Result Value Ref Range Status   Specimen Description SPUTUM  Final   Special Requests NONE  Final   Sputum evaluation   Final    Sputum specimen not acceptable for testing.  Please recollect.   CALLED TO MARCELLA TURNER @ 2009 ON 07/17/2015 BY CAF   Report Status 07/17/2015 FINAL  Final  Culture, blood (single)  Status: None (Preliminary result)   Collection Time: 07/18/15 10:02 AM  Result Value Ref Range Status   Specimen Description BLOOD LEFT HAND  Final   Special Requests BOTTLES DRAWN AEROBIC AND ANAEROBIC  2CC  Final   Culture NO GROWTH 4 DAYS  Final   Report Status PENDING  Incomplete    Medical History: Past Medical History  Diagnosis Date  . Muscle weakness   . Chronic respiratory failure (HCC)   . Pneumonia   . Sleep apnea   . CHF (congestive heart failure) (HCC)   . CKD (chronic kidney disease), stage IV (HCC)   . Dysphagia   . GERD (gastroesophageal reflux disease)   . Back pain   . Epilepsy (HCC)   . Hypertension   . Stroke (HCC)   . Headache    . Arthritis   . Anemia     iron deficiency anemia  . Complication of anesthesia     difficult waking up after surgery (2013)    Medications:  Scheduled:  . amLODipine  10 mg Oral QPM  . antiseptic oral rinse  7 mL Mouth Rinse BID  . aspirin EC  81 mg Oral Daily  . carvedilol  12.5 mg Oral Q12H  . cloNIDine  0.1 mg Oral TID  . cyanocobalamin  500 mcg Oral Daily  . dextrose  1 ampule Intravenous Once  . dialysis solution 1.5% low-MG/low-CA   Intraperitoneal Q24H  . docusate sodium  100 mg Oral QODAY  . fentaNYL  25 mcg Transdermal Q72H  . fluticasone  2 spray Each Nare Daily  . furosemide  40 mg Oral Daily  . gentamicin cream  1 application Topical Daily  . heparin  5,000 Units Subcutaneous 3 times per day  . hydrALAZINE  100 mg Oral TID  . latanoprost  1 drop Both Eyes QHS  . lidocaine  1 patch Transdermal Q24H  . loratadine  10 mg Oral Daily  . mirtazapine  15 mg Oral QHS  . multivitamin-lutein  1 capsule Oral BID  . pantoprazole (PROTONIX) IV  40 mg Intravenous Q12H  . phenytoin (DILANTIN) IV  300 mg Intravenous QHS  . piperacillin-tazobactam (ZOSYN)  IV  3.375 g Intravenous Q12H  . predniSONE  10 mg Oral Daily  . ascorbic acid  500 mg Oral Daily   Assessment: Patient is a 81 yo female with orders for Vancomycin and Zosyn for empiric treatment of pneumonia.  Per notes, patient has ESRD requiring peritoneal dialysis. Vancomycin d/c 12/16. Patient now receiving HD.   Plan:  Will continue Zosyn 3.375 gm IV q12h.   Pharmacy will continue to follow.   Follow up culture results  [name] 07/23/2015,11:28 AM

## 2015-07-23 NOTE — Progress Notes (Signed)
Pt has poor appetite. Complained that she has trouble swallowing everything and coughs with swallowing. FSBS was 65. Had a few sips of orange juice, milk and a bite of pancakes and yogurt and decided she did not want anything else to eat or drink. FSBS up to 71. Will continue to monitor progress.

## 2015-07-23 NOTE — Care Management (Signed)
There is an [place] indicating that patient  is being followed by a [facility] which is a department under the [facility].  Patient is not in any way being followed by hospice.  She is receiving home health services- not to be mistaken with  any type of hospice services.  Patient has been placed on dysphagia 1 with nectar thick liquids.  She showed some overt s/s aspiration.  She is  getting fatigued with activity.    The referral for the trilogy is in progress.  Informed that patient is getting ready to have pressors started due to hypotension.

## 2015-07-23 NOTE — Progress Notes (Signed)
Visit made. Patient seen lying in bed, dialysis nurse present. Family not at bedside. Per discussion with staff RN Sunny SchleinFelicia, patient reported difficulty swallowing and could not take her poral medications. She remains on oxygen via nasal cannula at 2 liters and continues on IV antibiotics. Due to her swallowing difficulty her dilantin was changed from oral to IV.Palliative care remains involved.  No discharge planned at this time. Will continue to follow through final disposition. Dayna BarkerKaren Robertson RN, BSN, [facility], [facility] [phone] c

## 2015-07-23 NOTE — Progress Notes (Signed)
Elink notified about Pt's sats maintaining below 86% while performing overnight room air study. New orders given. Will continue to monitor

## 2015-07-23 NOTE — Progress Notes (Signed)
Pt with PIV, neo started through PIV, MD Ramswamy approved, nursing will cont to monitor

## 2015-07-23 NOTE — Progress Notes (Signed)
[facility] hypotensive. [facility]. Plan to start pressors. Daughter in room at bedside and aware. Pallative MD in room and also aware of patient and family's wishes. Pt only has dialysis access, plan to try to get peripheral access. 2nd RN communicated with patient's daughter who requested medications only, no intubation, no cpr. Daughter to discuss with brother if they want patient to have a central line if medication incompatible with peripheral line is needed.

## 2015-07-23 NOTE — Progress Notes (Signed)
[facility] [place] [facility]   PATIENT NAME: Kristina Wilson    MR#:  [id number]  DATE OF BIRTH:  05-13-1923  SUBJECTIVE:  CHIEF COMPLAINT:   Chief Complaint  Patient presents with  . Shortness of Breath   - Patient sleeping, seen during dialysis in the room. -She could not tolerate sips of water or any food this morning as she was afraid that she was going to choke. According to RN, she spit her milk out. He also coughed while taking sips of water. -Remains alert  REVIEW OF SYSTEMS:  Review of Systems  Constitutional: Negative for fever and chills.  HENT: Negative for ear pain and tinnitus.   Eyes: Negative for blurred vision.  Respiratory: Negative for cough, shortness of breath and wheezing.   Cardiovascular: Negative for chest pain, palpitations and leg swelling.  Gastrointestinal: Positive for nausea. Negative for vomiting, abdominal pain, diarrhea and constipation.  Genitourinary: Negative for dysuria.  Musculoskeletal: Negative for myalgias.  Neurological: Positive for weakness. Negative for dizziness, speech change, focal weakness, seizures and headaches.  Psychiatric/Behavioral: Negative for depression.    DRUG ALLERGIES:   Allergies  Allergen Reactions  . Enalapril Anaphylaxis  . Tramadol Shortness Of Breath  . Capsaicin Other (See Comments)    Reaction:  Unknown   . Codeine Other (See Comments)    Reaction:  Unknown   . Hydrocodone Other (See Comments)    Reaction:  Unknown   . Vicodin [Hydrocodone-Acetaminophen] Other (See Comments)    Reaction:  Unknown     VITALS:  Blood pressure 133/52, pulse 89, temperature 97.8 F (36.6 C), temperature source Temporal, resp. rate 20, height  (1.676 m), weight 73.4 kg (161 lb 13.1 oz), SpO2 94 %.  PHYSICAL EXAMINATION:  Physical Exam  GENERAL:  81 y.o.-year-old patient lying in the bed with no acute distress.  EYES: Pupils equal, round, reactive to light and accommodation. No  scleral icterus. Extraocular muscles intact.  HEENT: Head atraumatic, normocephalic. Oropharynx and nasopharynx clear.  NECK:  Supple, no jugular venous distention. No thyroid enlargement, no tenderness.  LUNGS: Normal breath sounds bilaterally, decreased bibasilar breath sounds, crackles heard diffusely,  no wheezing, or rhonchi. No use of accessory muscles of respiration.  CARDIOVASCULAR: S1, S2 normal. No rubs, or gallops. 3/6 systolic murmur present ABDOMEN: Soft, nontender, nondistended. Bowel sounds present. No organomegaly or mass.  EXTREMITIES: No cyanosis, or clubbing. 1+ pedal edema present Complains of tenderness when her legs are being examined NEUROLOGIC: patient is alert, not following commands. Cranial nerves II through XII are intact. No focal deficits noted. Sensation intact. Gait not checked.  PSYCHIATRIC: The patient is sleepy, but easily arousable. Following some simple commands SKIN: No obvious rash, lesion, or ulcer.    LABORATORY PANEL:   CBC  Recent Labs Lab 07/23/15 0527  WBC 5.3  HGB 10.1*  HCT 31.5*  PLT 169   ------------------------------------------------------------------------------------------------------------------  Chemistries   Recent Labs Lab 07/18/15 0657 07/19/15 0433  07/23/15 0527  NA 125* 131*  < > 136  K 2.7* 3.0*  < > 3.0*  CL 91* 97*  < > 101  CO2 27 26  < > 27  GLUCOSE 88 64*  < > 84  BUN 26* 34*  < > 26*  CREATININE 2.62* 2.93*  < > 3.19*  CALCIUM 8.8* 8.4*  < > 8.2*  MG 2.1  --   --   --   AST  --  21  --   --  ALT  --  12*  --   --   ALKPHOS  --  82  --   --   BILITOT  --  0.8  --   --   < > = values in this interval not displayed. ------------------------------------------------------------------------------------------------------------------  Cardiac Enzymes  Recent Labs Lab 07/17/15 0351  TROPONINI <0.03    ------------------------------------------------------------------------------------------------------------------  RADIOLOGY:  Dg Chest Port 1 View  07/23/2015  CLINICAL DATA:  CHF, chronic respiratory failure, chronic renal insufficiency. EXAM: PORTABLE CHEST 1 VIEW COMPARISON:  Portable chest x-ray of July 21, 2015 FINDINGS: The lungs are less well inflated today. There are bilateral pleural effusions. The pulmonary interstitial markings are mildly increased. Confluent alveolar opacities are more conspicuous in the right infrahilar region and left perihilar regions. The cardiac silhouette remains enlarged. The pulmonary vascularity is mildly engorged. A dual-lumen dialysis type catheter placed via the right internal jugular approach has its tip in the junction of the middle and distal portions of the SVC. The bony thorax exhibits no acute abnormality. IMPRESSION: Mild CHF with bilateral pleural effusions. Persistent alveolar opacity bilaterally is worrisome for pneumonia possibly related to aspiration. Electronically Signed   By: David  [place] M.D.   On: 07/23/2015 07:40   Dg Chest Port 1 View  07/21/2015  CLINICAL DATA:  Respiratory failure.  Subsequent encounter. EXAM: PORTABLE CHEST 1 VIEW COMPARISON:  07/19/2015 FINDINGS: Pulmonary edema has improved as has vascular congestion. There is persistent opacity at the lung bases most likely combination of atelectasis and pleural fluid. Pneumonia is possible. Cardiac silhouette is mostly obscured. Appears mildly enlarged. No mediastinal or hilar masses. Right internal jugular dual-lumen central venous line is stable. IMPRESSION: 1. Improved lung aeration since the prior study with essential resolution of the pulmonary edema. 2. There is persistent lung base opacity most likely combination of atelectasis and pleural fluid. Consider pneumonia if there are consistent clinical symptoms. Electronically Signed   By: Amie Portland M.D.   On: 07/21/2015  19:07    EKG:   Orders placed or performed during the hospital encounter of 07/17/15  . ED EKG  . ED EKG  . EKG 12-Lead  . EKG 12-Lead    ASSESSMENT AND PLAN:   Principal Problem:  Aspiration pneumonia (HCC) Active Problems:  CKD (chronic kidney disease), stage IV (HCC)  Pressure ulcer  Chronic respiratory failure (HCC)  HTN (hypertension), benign  * aspiration Vs HCAP pneumonia- causing acute on chronic respiratory distress.  Associated sepsis - present on admission- with altered mental status. vanc discontinued as blood cultures negative, on zosyn- stop after 10 days of ABX- after 07/26/15 dose  Swallow evaluation with dysphagia diet, nectar thick  appreciate pulm consult - f/u CXR -with persistent infiltrate, likely from aspiration pneumonia. Also has some pulmonary vascular congestion and small pleural effusions.  * Chronic respiratory failure  On oxygen, continue. Now weaned to 3L nasal cannula - CXR with pulm vascular congestion- Improved breathing with dialysis - patient also has manic respiratory failure due to underlying COPD with frequent hypercarbia -BiPAP device has been considered and has been ruled out as an effective treatment due to inability to tolerate and frequent need for it. -Patient will need noninvasive positive pressure ventilation due to severity of her disease state and life-threatening condition including CO2 retention. -Patient will need nocturnal usage of such ventilation and also during the day as needed due to increased probability of acute exacerbations. And without such ventilatory support serious, or death could occur. - care manager trying to  arrange that.  * ESRD  on PD- nephro consult. Hemodialysis done again today -Has peritoneal dialysis catheter and family want to continue to do PD as outpatient Also has a right chest permacath for HD in hospital - appreciate nephrology consult,  -Patient and family want to continue dialysis  at this point  * Htn  On Coreg, clonidine and Lasix Also on oral hydralazine.  * CVA Hx  Bedbound- is total care.  Family do not want any rehabilitation. They want to take her home.  * Hx of seizure disorder  On dilantin  * DVT Prophylaxis- SQ Heparin   Cont to monitor, physical therapy consult when able to Overall poor prognosis.   All the records are reviewed and case discussed with Care Management/Social Workerr. Management plans discussed with the patient, family and they are in agreement.  CODE STATUS: DNR  TOTAL TIME TAKING CARE OF THIS PATIENT: 37 minutes.   POSSIBLE D/C IN 3 DAYS, DEPENDING ON CLINICAL CONDITION.   Enid BaasKALISETTI,Tequita Marrs M.D on 07/23/2015 at 3:36 PM  Between 7am to 6pm - Pager - [phone]  After 6pm go to [email or web address] - password [email or web address] [facility]  [facility]  Office  [phone]  CC: Primary care physician; Jaclyn ShaggyATE,DENNY C, MD

## 2015-07-23 NOTE — Progress Notes (Signed)
eLink Physician-Brief Progress Note Patient Name: Kristina Wilson DOB: 06/13/1923 MRN: [id number]   Date of Service  07/23/2015  HPI/Events of Note  Call from Dr Orvan Falconerampbell pall care -says there is much confusionf rom health care providers about patient goals. [facility] but [facility] not the [facility] Daughter only wants no code/DNI  but full aggressive medical care  eICU Interventions  x     Intervention Category Major Interventions: End of life / care limitation discussion  Hashem Goynes 07/23/2015, 5:43 PM

## 2015-07-23 NOTE — Progress Notes (Signed)
[facility]  ROUNDING NOTE   Subjective:   Patient seen during dialysis Tolerating well  No acute SOB Off of BiPAP at present   Objective:  Vital signs in last 24 hours:  Temp:  [97.8 F (36.6 C)-98.6 F (37 C)] 97.8 F (36.6 C) (12/20 0945) Pulse Rate:  [45-98] 78 (12/20 0900) Resp:  [13-23] 16 (12/20 0900) BP: (97-161)/(32-100) 135/42 mmHg (12/20 0945) SpO2:  [82 %-100 %] 92 % (12/20 0900) Weight:  [73.4 kg (161 lb 13.1 oz)] 73.4 kg (161 lb 13.1 oz) (12/20 0500)  Weight change: 0.5 kg (1 lb 1.6 oz) Filed Weights   07/21/15 0505 07/22/15 0500 07/23/15 0500  Weight: 79.697 kg (175 lb 11.2 oz) 72.9 kg (160 lb 11.5 oz) 73.4 kg (161 lb 13.1 oz)    Intake/Output: I/O last 3 completed shifts: In: 492 [Other:130; IV Piggyback:362] Out: 2000 [Other:2000]   Intake/Output this shift:     Physical Exam: General: Ill appearing  Head: Normocephalic, atraumatic. Moist oral mucosal membranes  Eyes: Anicteric  Neck: Supple, trachea midline  Lungs:  Bilateral rhonchi, mild basilar crackles  Heart: S1S2 no rubs  Abdomen:  Soft, nontender, BS present   Extremities: 1-2+ peripheral edema.  Neurologic: Arousable, able to communicate and follow commands  Skin: No acute lesions  Access: PD catheter in place, RIJ permcath    Basic Metabolic Panel:  Recent Labs Lab 07/18/15 0657 07/19/15 0433 07/20/15 0500 07/21/15 0526 07/22/15 0434 07/22/15 1625 07/23/15 0527  NA 125* 131* 131* 129* 137  --  136  K 2.7* 3.0* 3.6 3.8 2.9* 3.0* 3.0*  CL 91* 97* 95* 96* 99*  --  101  CO2 --  27  GLUCOSE 88 64* 77 119* 92  --  84  BUN 26* 34* 39* 43* 20  --  26*  CREATININE 2.62* 2.93* 3.39* 3.81* 2.42*  --  3.19*  CALCIUM 8.8* 8.4* 8.1* 8.0* 7.9*  --  8.2*  MG 2.1  --   --   --   --   --   --     Liver Function Tests:  Recent Labs Lab 07/19/15 0433  AST 21  ALT 12*  ALKPHOS 82  BILITOT 0.8  PROT 4.6*  ALBUMIN 1.9*   No results for input(s):  LIPASE, AMYLASE in the last 168 hours. No results for input(s): AMMONIA in the last 168 hours.  CBC:  Recent Labs Lab 07/18/15 0657 07/19/15 0433 07/20/15 0500 07/21/15 0526 07/23/15 0527  WBC 9.0 7.8 7.7 7.2 5.3  HGB 12.0 11.3* 10.8* 10.9* 10.1*  HCT 37.5 34.6* 34.4* 34.5* 31.5*  MCV 95.9 96.1 96.1 95.8 94.2  PLT 233 206 211 194 169    Cardiac Enzymes:  Recent Labs Lab 07/17/15 0351  TROPONINI <0.03    BNP: Invalid input(s): POCBNP  CBG:  Recent Labs Lab 07/22/15 1956 07/23/15 0009 07/23/15 0406 07/23/15 0731 07/23/15 0857  GLUCAP 82 80 79 65 71    Microbiology: Results for orders placed or performed during the hospital encounter of 07/17/15  Blood culture (routine x 2)     Status: None   Collection Time: 07/17/15  4:02 AM  Result Value Ref Range Status   Specimen Description BLOOD RIGHT HAND  Final   Special Requests BOTTLES DRAWN AEROBIC AND ANAEROBIC  Final   Culture NO GROWTH 5 DAYS  Final   Report Status 07/22/2015 FINAL  Final  Blood culture (routine x 2)  Status: None   Collection Time: 07/17/15  4:02 AM  Result Value Ref Range Status   Specimen Description BLOOD LEFT ANTECUBITAL  Final   Special Requests BOTTLES DRAWN AEROBIC AND ANAEROBIC 15ML  Final   Culture  Setup Time   Final    GRAM POSITIVE COCCI IN CLUSTERS AEROBIC BOTTLE ONLY CRITICAL RESULT CALLED TO, READ BACK BY AND VERIFIED WITH: MARCELLA TURNER AT 2253 07/17/2015 BY TFK CONFIRMED BY MLZ    Culture   Final    COAGULASE NEGATIVE STAPHYLOCOCCUS AEROBIC BOTTLE ONLY Results consistent with contamination.    Report Status 07/22/2015 FINAL  Final  MRSA PCR Screening     Status: Abnormal   Collection Time: 07/17/15  6:51 AM  Result Value Ref Range Status   MRSA by PCR POSITIVE (A) NEGATIVE Final    Comment:        The GeneXpert MRSA Assay (FDA approved for NASAL specimens only), is one component of a comprehensive MRSA colonization surveillance program. It is  not intended to diagnose MRSA infection nor to guide or monitor treatment for MRSA infections. CRITICAL RESULT CALLED TO, READ BACK BY AND VERIFIED WITH: Yehuda SavannahLauren Hobbs @ 07/16/16 by [facility]   Culture, sputum-assessment     Status: None   Collection Time: 07/17/15  5:44 PM  Result Value Ref Range Status   Specimen Description SPUTUM  Final   Special Requests NONE  Final   Sputum evaluation   Final    Sputum specimen not acceptable for testing.  Please recollect.   CALLED TO MARCELLA TURNER @ 2009 ON 07/17/2015 BY CAF   Report Status 07/17/2015 FINAL  Final  Culture, blood (single)     Status: None (Preliminary result)   Collection Time: 07/18/15 10:02 AM  Result Value Ref Range Status   Specimen Description BLOOD LEFT HAND  Final   Special Requests BOTTLES DRAWN AEROBIC AND ANAEROBIC  2CC  Final   Culture NO GROWTH 4 DAYS  Final   Report Status PENDING  Incomplete    Coagulation Studies: No results for input(s): LABPROT, INR in the last 72 hours.  Urinalysis: No results for input(s): COLORURINE, LABSPEC, PHURINE, GLUCOSEU, HGBUR, BILIRUBINUR, KETONESUR, PROTEINUR, UROBILINOGEN, NITRITE, LEUKOCYTESUR in the last 72 hours.  Invalid input(s): APPERANCEUR    Imaging: Dg Chest Port 1 View  07/23/2015  CLINICAL DATA:  CHF, chronic respiratory failure, chronic renal insufficiency. EXAM: PORTABLE CHEST 1 VIEW COMPARISON:  Portable chest x-ray of July 21, 2015 FINDINGS: The lungs are less well inflated today. There are bilateral pleural effusions. The pulmonary interstitial markings are mildly increased. Confluent alveolar opacities are more conspicuous in the right infrahilar region and left perihilar regions. The cardiac silhouette remains enlarged. The pulmonary vascularity is mildly engorged. A dual-lumen dialysis type catheter placed via the right internal jugular approach has its tip in the junction of the middle and distal portions of the SVC. The bony thorax exhibits no acute  abnormality. IMPRESSION: Mild CHF with bilateral pleural effusions. Persistent alveolar opacity bilaterally is worrisome for pneumonia possibly related to aspiration. Electronically Signed   By: David  [place] M.D.   On: 07/23/2015 07:40   Dg Chest Port 1 View  07/21/2015  CLINICAL DATA:  Respiratory failure.  Subsequent encounter. EXAM: PORTABLE CHEST 1 VIEW COMPARISON:  07/19/2015 FINDINGS: Pulmonary edema has improved as has vascular congestion. There is persistent opacity at the lung bases most likely combination of atelectasis and pleural fluid. Pneumonia is possible. Cardiac silhouette is mostly obscured. Appears mildly enlarged. No mediastinal  or hilar masses. Right internal jugular dual-lumen central venous line is stable. IMPRESSION: 1. Improved lung aeration since the prior study with essential resolution of the pulmonary edema. 2. There is persistent lung base opacity most likely combination of atelectasis and pleural fluid. Consider pneumonia if there are consistent clinical symptoms. Electronically Signed   By: Amie Portland M.D.   On: 07/21/2015 19:07     Medications:     . amLODipine  10 mg Oral QPM  . antiseptic oral rinse  7 mL Mouth Rinse BID  . aspirin EC  81 mg Oral Daily  . carvedilol  12.5 mg Oral Q12H  . cloNIDine  0.1 mg Oral TID  . cyanocobalamin  500 mcg Oral Daily  . dextrose  1 ampule Intravenous Once  . dialysis solution 1.5% low-MG/low-CA   Intraperitoneal Q24H  . docusate sodium  100 mg Oral QODAY  . fentaNYL  25 mcg Transdermal Q72H  . fluticasone  2 spray Each Nare Daily  . furosemide  40 mg Oral Daily  . gentamicin cream  1 application Topical Daily  . heparin  5,000 Units Subcutaneous 3 times per day  . hydrALAZINE  100 mg Oral TID  . latanoprost  1 drop Both Eyes QHS  . lidocaine  1 patch Transdermal Q24H  . loratadine  10 mg Oral Daily  . mirtazapine  15 mg Oral QHS  . multivitamin-lutein  1 capsule Oral BID  . pantoprazole (PROTONIX) IV  40 mg  Intravenous Q12H  . phenytoin (DILANTIN) IV  300 mg Intravenous QHS  . piperacillin-tazobactam (ZOSYN)  IV  3.375 g Intravenous Q12H  . predniSONE  10 mg Oral Daily  . ascorbic acid  500 mg Oral Daily   acetaminophen, albuterol, hydrALAZINE, lactulose, morphine injection, nitroGLYCERIN, ondansetron **OR** ondansetron (ZOFRAN) IV  Assessment/ Plan:  81 y.o. female with past medical history of hypertension, obstructive sleep apnea, DJD, CVA with residual left sided weakness, carotid stenosis status post L CEA 2/15, seizure disorder, diastolic congestive heart failure, anemia  [facility] PD [facility]  1. End Stage Renal Disease on PD: Patient's daughter had started PD training as outpatient but got only 2 out of 8 days.   emergent HD 12/18. 2500 cc removed. Breathing is better today Patient appears to continue to want aggressive care as she is aware that quitting dialysis would mean hospice and death in near future.  Plan: Patient seen during hemo-dialysis Tolerating well   2. Hypertension: Allergy to enalapril (causes anaphylaxis). Blood pressures now at goal.  - home regimen of amlodipine, carvedilol, clonidine, furosemide, hydralazine.   3. Secondary hyperparathyroidism. Phos and calcium low outpatient due to poor nutritional status. Not currently on binders.   4. Anemia chronic kidney disease: Hemoglobin 10.1 - low dose epo with HD.   5.  Bacterial pneumonia/respiratory failure:  Suspect aspiration.  Abx continued  6. Hypokalemia - will replace iv x 1  7. Palliative care team following  8. Acute pulm edema - now off of BiPAP - 2500 cc removed with HD last night     LOS: 6 Kristina Wilson 12/20/201611:10 AM

## 2015-07-23 NOTE — Progress Notes (Signed)
eLink Physician-Brief Progress Note Patient Name: Kristina RhodesJacquelyn R Gebert DOB: 1923-06-05 MRN: [id number]   Date of Service  07/23/2015  HPI/Events of Note  hypotenisve resp distress - on bipap and some btter per RN  D/w pall care at bedside - full medical care includin g pressors but DNR/DNI i f arrests per daughter  eICU Interventions  Start neo     Intervention Category Intermediate Interventions: Hypotension - evaluation and management  Maximus Hoffert 07/23/2015, 5:23 PM

## 2015-07-24 ENCOUNTER — Inpatient Hospital Stay: Payer: Medicare Other

## 2015-07-24 DIAGNOSIS — I10 Essential (primary) hypertension: Secondary | ICD-10-CM

## 2015-07-24 LAB — BASIC METABOLIC PANEL
Anion gap: 9 (ref 5–15)
BUN: 14 mg/dL (ref 6–20)
CHLORIDE: 99 mmol/L — AB (ref 101–111)
CO2: 29 mmol/L (ref 22–32)
CREATININE: 2.08 mg/dL — AB (ref 0.44–1.00)
Calcium: 8.2 mg/dL — ABNORMAL LOW (ref 8.9–10.3)
GFR calc Af Amer: 23 mL/min — ABNORMAL LOW (ref >60)
GFR, EST NON AFRICAN AMERICAN: 20 mL/min — AB (ref >60)
GLUCOSE: 166 mg/dL — AB (ref 65–99)
POTASSIUM: 2.5 mmol/L — AB (ref 3.5–5.1)
SODIUM: 137 mmol/L (ref 135–145)

## 2015-07-24 LAB — GLUCOSE, CAPILLARY
GLUCOSE-CAPILLARY: 123 mg/dL — AB (ref 65–99)
GLUCOSE-CAPILLARY: 130 mg/dL — AB (ref 65–99)
GLUCOSE-CAPILLARY: 112 mg/dL — AB (ref 65–99)
Glucose-Capillary: 122 mg/dL — ABNORMAL HIGH (ref 65–99)
Glucose-Capillary: 156 mg/dL — ABNORMAL HIGH (ref 65–99)
Glucose-Capillary: 163 mg/dL — ABNORMAL HIGH (ref 65–99)

## 2015-07-24 LAB — MAGNESIUM: Magnesium: 1.8 mg/dL (ref 1.7–2.4)

## 2015-07-24 MED ORDER — PHENYTOIN 125 MG/5ML PO SUSP
300.0000 mg | Freq: Every day | ORAL | Status: DC
  Administered 2015-07-24 – 2015-07-29 (×6): 300 mg via ORAL
  Filled 2015-07-24 (×8): qty 12

## 2015-07-24 MED ORDER — POTASSIUM CHLORIDE 10 MEQ/100ML IV SOLN
10.0000 meq | INTRAVENOUS | Status: AC
  Administered 2015-07-24 (×2): 10 meq via INTRAVENOUS
  Filled 2015-07-24 (×2): qty 100

## 2015-07-24 NOTE — Consult Note (Signed)
[facility] Consult[facility]      Name: Kristina Wilson MRN: [id number] DOB: November 08, 1922    ADMISSION DATE:  07/17/2015   REFERRING MD :  Dr. Desiree Hanevachani   CHIEF COMPLAINT:   Acute resp failure   HISTORY OF PRESENT ILLNESS  Intermittent use of bipap More alert and awake this morning.  Patient foll Follow up  Patient Review of Systems  Constitutional: Negative for fever and chills.  HENT: Negative for ear discharge and ear pain.   Eyes: Negative for blurred vision.  Respiratory: Positive for cough and shortness of breath. Negative for hemoptysis.   Gastrointestinal: Negative for heartburn and nausea.  Genitourinary: Positive for dysuria.  Neurological: Positive for weakness. Negative for dizziness and headaches.  Endo/Heme/Allergies: Does not bruise/bleed easily.      VITAL SIGNS    Temp:  [97.6 F (36.4 C)-98.3 F (36.8 C)] 97.6 F (36.4 C) (12/21 0800) Pulse Rate:  [31-84] 43 (12/21 1300) Resp:  [13-39] 13 (12/21 1300) BP: (62-153)/(40-99) 131/53 mmHg (12/21 1300) SpO2:  [91 %-100 %] 99 % (12/21 1300) FiO2 (%):  [30 %] 30 % (12/21 1100) Weight:  [163 lb 12.8 oz (74.3 kg)] 163 lb 12.8 oz (74.3 kg) (12/21 0500) HEMODYNAMICS:   VENTILATOR SETTINGS: Vent Mode:  [-]  FiO2 (%):  [30 %] 30 % INTAKE / OUTPUT:  Intake/Output Summary (Last 24 hours) at 07/24/15 1520 Last data filed at 07/24/15 0657  Gross per 24 hour  Intake 1062.14 ml  Output      1 ml  Net 1061.14 ml       PHYSICAL EXAM   Physical Exam  HENT:  Head: Normocephalic and atraumatic.  Eyes: Pupils are equal, round, and reactive to light.  Neck: Normal range of motion. Neck supple.  Cardiovascular: Normal rate and regular rhythm.   No murmur heard. Pulmonary/Chest: She has no wheezes. She has rales.  resp distress  Abdominal: Soft. She exhibits no distension. There is no tenderness.  Musculoskeletal: She exhibits no edema.  Neurological: She is alert. She displays  normal reflexes. Coordination normal.  Skin: Skin is warm.       LABS   LABS:  CBC  Recent Labs Lab 07/20/15 0500 07/21/15 0526 07/23/15 0527  WBC 7.7 7.2 5.3  HGB 10.8* 10.9* 10.1*  HCT 34.4* 34.5* 31.5*  PLT 211 194 169   Coag's No results for input(s): APTT, INR in the last 168 hours. BMET  Recent Labs Lab 07/22/15 0434 07/22/15 1625 07/23/15 0527 07/24/15 0434  NA 137  --  136 137  K 2.9* 3.0* 3.0* 2.5*  CL 99*  --  101 99*  CO2 27  --  27 29  BUN 20  --  26* 14  CREATININE 2.42*  --  3.19* 2.08*  GLUCOSE 92  --  84 166*   Electrolytes  Recent Labs Lab 07/18/15 0657  07/22/15 0434 07/23/15 0527 07/24/15 0434  CALCIUM 8.8*  < > 7.9* 8.2* 8.2*  MG 2.1  --   --   --  1.8  < > = values in this interval not displayed. Sepsis Markers No results for input(s): LATICACIDVEN, PROCALCITON, O2SATVEN in the last 168 hours. ABG  Recent Labs Lab 07/19/15 1135 07/21/15 1350 07/22/15 1655  PHART 7.31* 7.28* 7.32*  PCO2ART 57* 51* 53*  PO2ART 77* 93 47*   Liver Enzymes  Recent Labs Lab 07/19/15 0433  AST 21  ALT 12*  ALKPHOS 82  BILITOT 0.8  ALBUMIN 1.9*  Cardiac Enzymes No results for input(s): TROPONINI, PROBNP in the last 168 hours. Glucose  Recent Labs Lab 07/23/15 1645 07/23/15 1957 07/23/15 2354 07/24/15 0355 07/24/15 0724 07/24/15 1155  GLUCAP 112* 135* 163* 156* 123* 122*     Recent Results (from the past 240 hour(s))  Blood culture (routine x 2)     Status: None   Collection Time: 07/17/15  4:02 AM  Result Value Ref Range Status   Specimen Description BLOOD RIGHT HAND  Final   Special Requests BOTTLES DRAWN AEROBIC AND ANAEROBIC  Final   Culture NO GROWTH 5 DAYS  Final   Report Status 07/22/2015 FINAL  Final  Blood culture (routine x 2)     Status: None   Collection Time: 07/17/15  4:02 AM  Result Value Ref Range Status   Specimen Description BLOOD LEFT ANTECUBITAL  Final   Special Requests BOTTLES DRAWN AEROBIC  AND ANAEROBIC  Final   Culture  Setup Time   Final    GRAM POSITIVE COCCI IN CLUSTERS AEROBIC BOTTLE ONLY CRITICAL RESULT CALLED TO, READ BACK BY AND VERIFIED WITH: MARCELLA TURNER AT 2253 07/17/2015 BY [facility]K CONFIRMED BY MLZ    Culture   Final    COAGULASE NEGATIVE STAPHYLOCOCCUS AEROBIC BOTTLE ONLY Results consistent with contamination.    Report Status 07/22/2015 FINAL  Final  MRSA PCR Screening     Status: Abnormal   Collection Time: 07/17/15  6:51 AM  Result Value Ref Range Status   MRSA by PCR POSITIVE (A) NEGATIVE Final    Comment:        The GeneXpert MRSA Assay (FDA approved for NASAL specimens only), is one component of a comprehensive MRSA colonization surveillance program. It is not intended to diagnose MRSA infection nor to guide or monitor treatment for MRSA infections. CRITICAL RESULT CALLED TO, READ BACK BY AND VERIFIED WITH: Yehuda Savannah @ 07/16/16 by [facility]   Culture, sputum-assessment     Status: None   Collection Time: 07/17/15  5:44 PM  Result Value Ref Range Status   Specimen Description SPUTUM  Final   Special Requests NONE  Final   Sputum evaluation   Final    Sputum specimen not acceptable for testing.  Please recollect.   CALLED TO MARCELLA TURNER @ 2009 ON 07/17/2015 BY [facility]   Report Status 07/17/2015 FINAL  Final  Culture, blood (single)     Status: None   Collection Time: 07/18/15 10:02 AM  Result Value Ref Range Status   Specimen Description BLOOD LEFT HAND  Final   Special Requests BOTTLES DRAWN AEROBIC AND ANAEROBIC  2CC  Final   Culture NO GROWTH 5 DAYS  Final   Report Status 07/23/2015 FINAL  Final     Current facility-administered medications:  .  acetaminophen (TYLENOL) tablet 650 mg, 650 mg, Oral, TID PRN, Crissie Figures, MD .  albuterol (PROVENTIL) (2.5 MG/3ML) 0.083% nebulizer solution 2.5 mg, 2.5 mg, Nebulization, Q2H PRN, Crissie Figures, MD, 2.5 mg at 07/17/15 2105 .  amLODipine (NORVASC) tablet 10 mg, 10 mg, Oral, QPM,  Crissie Figures, MD, 10 mg at 07/23/15 2139 .  antiseptic oral rinse (CPC / CETYLPYRIDINIUM CHLORIDE 0.05%) solution 7 mL, 7 mL, Mouth Rinse, BID, Altamese Dilling, MD, 7 mL at 07/24/15 1000 .  aspirin EC tablet 81 mg, 81 mg, Oral, Daily, Crissie Figures, MD, 81 mg at 07/24/15 0851 .  carvedilol (COREG) tablet 12.5 mg, 12.5 mg, Oral, Q12H, Crissie Figures, MD, 12.5 mg at  07/23/15 2136 .  cloNIDine (CATAPRES) tablet 0.1 mg, 0.1 mg, Oral, TID, Crissie Figures, MD, 0.1 mg at 07/23/15 2136 .  cyanocobalamin tablet 500 mcg, 500 mcg, Oral, Daily, Crissie Figures, MD, 500 mcg at 07/24/15 0851 .  dextrose 50 % solution 50 mL, 1 ampule, Intravenous, Once, Lamont Dowdy, MD, 50 mL at 07/21/15 1241 .  dialysis solution 1.5% low-MG/low-CA dianeal solution, , Intraperitoneal, Q24H, Munsoor Lateef, MD, Stopped at 07/20/15 1645 .  docusate sodium (COLACE) capsule 100 mg, 100 mg, Oral, QODAY, Crissie Figures, MD, 100 mg at 07/23/15 1435 .  fentaNYL (DURAGESIC - dosed mcg/hr) 25 mcg, 25 mcg, Transdermal, Q72H, Crissie Figures, MD, 25 mcg at 07/23/15 0518 .  fluticasone (FLONASE) 50 MCG/ACT nasal spray 2 spray, 2 spray, Each Nare, Daily, Crissie Figures, MD, 2 spray at 07/24/15 0851 .  furosemide (LASIX) tablet 40 mg, 40 mg, Oral, Daily, Crissie Figures, MD, 40 mg at 07/23/15 1440 .  gentamicin cream (GARAMYCIN) 0.1 % 1 application, 1 application, Topical, Daily, Munsoor Lateef, MD, 1 application at 07/24/15 0852 .  heparin injection 5,000 Units, 5,000 Units, Subcutaneous, 3 times per day, Crissie Figures, MD, 5,000 Units at 07/24/15 1306 .  hydrALAZINE (APRESOLINE) injection 10 mg, 10 mg, Intravenous, Q6H PRN, Enid Baas, MD .  hydrALAZINE (APRESOLINE) tablet 100 mg, 100 mg, Oral, TID, Crissie Figures, MD, 100 mg at 07/23/15 1435 .  lactulose (CHRONULAC) 10 GM/15ML solution 20 g, 20 g, Oral, Daily PRN, Crissie Figures, MD .  latanoprost (XALATAN) 0.005 % ophthalmic solution 1 drop, 1 drop,  Both Eyes, QHS, Crissie Figures, MD, 1 drop at 07/23/15 2138 .  lidocaine (LIDODERM) 5 % 1 patch, 1 patch, Transdermal, Q24H, Enid Baas, MD, 1 patch at 07/23/15 2133 .  loratadine (CLARITIN) tablet 10 mg, 10 mg, Oral, Daily, Crissie Figures, MD, 10 mg at 07/24/15 4098 .  mirtazapine (REMERON) tablet 15 mg, 15 mg, Oral, QHS, Crissie Figures, MD, 15 mg at 07/23/15 2136 .  morphine 2 MG/ML injection 2-4 mg, 2-4 mg, Intravenous, Q1H PRN, Erin Fulling, MD, 2 mg at 07/22/15 0846 .  multivitamin-lutein (OCUVITE-LUTEIN) capsule 1 capsule, 1 capsule, Oral, BID, Crissie Figures, MD, 1 capsule at 07/24/15 0851 .  nitroGLYCERIN (NITROSTAT) SL tablet 0.4 mg, 0.4 mg, Sublingual, Q5 min PRN, Crissie Figures, MD .  ondansetron (ZOFRAN) tablet 4 mg, 4 mg, Oral, Q6H PRN **OR** ondansetron (ZOFRAN) injection 4 mg, 4 mg, Intravenous, Q6H PRN, Crissie Figures, MD .  pantoprazole (PROTONIX) injection 40 mg, 40 mg, Intravenous, Q12H, Erin Fulling, MD, 40 mg at 07/24/15 0851 .  phenylephrine (NEO-SYNEPHRINE) 10 mg in dextrose 5 % 250 mL (0.04 mg/mL) infusion, 30-200 mcg/min, Intravenous, Continuous, Kalman Shan, MD, Stopped at 07/24/15 0704 .  phenytoin (DILANTIN) 125 MG/5ML suspension 300 mg, 300 mg, Oral, QHS, Enid Baas, MD .  piperacillin-tazobactam (ZOSYN) IVPB 3.375 g, 3.375 g, Intravenous, Q12H, Masen Salvas, MD, 3.375 g at 07/24/15 0850 .  predniSONE (DELTASONE) tablet 10 mg, 10 mg, Oral, Daily, Crissie Figures, MD, 10 mg at 07/24/15 1191 .  vitamin C (ASCORBIC ACID) tablet 500 mg, 500 mg, Oral, Daily, Crissie Figures, MD, 500 mg at 07/24/15 0851     INDWELLING DEVICES:  MICRO DATA: MRSA PCR >>pos Urine  Blood Resp   ANTIMICROBIALS: vanc/zosyn 12/14>>    ASSESSMENT/PLAN   81 yo white female with progressive resp failure and progressive encephalopathy from acute resp failure From acute aspiration  pneumonia in the setting of ESRD on HD  PULMONARY Patient is  DNR/DNI -bipap and oxygen as needed -wean as tolerated -neb therapy as needed -empiric abx therapy for asp pneumonia -may have some lung irritation from asp PNA causing pneumonitis type picture, and requiring Bipap and supplemental O2. Given her advanced age and deconditioning, this will take longer to resolve -daughter is requesting either triology machine or bipap prior to [facility] - Child psychotherapist currently arranging.   CARDIOVASCULAR Not in shock at this time  RENAL ESRD on HD -follow up nephrology recs   GASTROINTESTINAL SUP Follow up swallow study.   HEMATOLOGIC Follow CBC  INFECTIOUS aspirtaion pneumonia -empiric abx   ENDOCRINE - ICU hypoglycemic\Hyperglycemia protocol   NEUROLOGIC -Encephalopathy - improving  SOCIAL - appreciate palliative care recs - DNR/DNI, full medical management.   The Patient requires high complexity decision making for assessment and support, frequent evaluation and titration of therapies, application of advanced monitoring technologies and extensive interpretation of multiple databases. Critical Care Time devoted to patient care services described in this note is 45 minutes.   Overall, patient is critically ill, prognosis is guarded. Patient at high risk for cardiac arrest and death.    Stephanie Acre, MD [facility] Pager [phone] (please enter 7-digits) On Call Pager - [phone] (please enter 7-digits)

## 2015-07-24 NOTE — Progress Notes (Signed)
[facility] - [place] at [facility]   PATIENT NAME: Kristina Wilson    MR#:  [id number]  DATE OF BIRTH:  1922/10/26  SUBJECTIVE:  CHIEF COMPLAINT:   Chief Complaint  Patient presents with  . Shortness of Breath   - Patient more alert today, appears weak. For MBSS today as not eating anything - feels she is not able to swallow - Also dialysis today - has episode of sleepiness/lethargy requiring Bipap yesterday and just changed to nasal cannula   REVIEW OF SYSTEMS:  Review of Systems  Constitutional: Negative for fever and chills.  HENT: Negative for ear pain and tinnitus.        Increased oral secretions  Eyes: Negative for blurred vision.  Respiratory: Negative for cough, shortness of breath and wheezing.   Cardiovascular: Negative for chest pain, palpitations and leg swelling.  Gastrointestinal: Positive for nausea. Negative for vomiting, abdominal pain, diarrhea and constipation.  Genitourinary: Negative for dysuria.  Musculoskeletal: Negative for myalgias.  Neurological: Positive for weakness. Negative for dizziness, speech change, focal weakness, seizures and headaches.  Psychiatric/Behavioral: Negative for depression.    DRUG ALLERGIES:   Allergies  Allergen Reactions  . Enalapril Anaphylaxis  . Tramadol Shortness Of Breath  . Capsaicin Other (See Comments)    Reaction:  Unknown   . Codeine Other (See Comments)    Reaction:  Unknown   . Hydrocodone Other (See Comments)    Reaction:  Unknown   . Vicodin [Hydrocodone-Acetaminophen] Other (See Comments)    Reaction:  Unknown     VITALS:  Blood pressure 121/63, pulse 70, temperature 97.6 F (36.4 C), temperature source Axillary, resp. rate 18, height  (1.676 m), weight 74.3 kg (163 lb 12.8 oz), SpO2 99 %.  PHYSICAL EXAMINATION:  Physical Exam  GENERAL:  81 y.o.-year-old patient lying in the bed with no acute distress.  EYES: Pupils equal, round, reactive to light and  accommodation. No scleral icterus. Extraocular muscles intact.  HEENT: Head atraumatic, normocephalic. Oropharynx and nasopharynx clear. Increased oral secretions NECK:  Supple, no jugular venous distention. No thyroid enlargement, no tenderness.  LUNGS: Normal breath sounds bilaterally, decreased bibasilar breath sounds, crackles heard diffusely,  no wheezing, or rhonchi. No use of accessory muscles of respiration.  CARDIOVASCULAR: S1, S2 normal. No rubs, or gallops. 3/6 systolic murmur present ABDOMEN: Soft, nontender, nondistended. Bowel sounds present. No organomegaly or mass.  EXTREMITIES: No cyanosis, or clubbing. 1+ pedal edema present Complains of tenderness when her legs are being examined NEUROLOGIC: patient is alert, following commands. Cranial nerves II through XII are intact. No focal deficits noted. Sensation intact. Gait not checked.  PSYCHIATRIC: The patient is more alert today, appears weak, more interactive and asking questions SKIN: No obvious rash, lesion, or ulcer.    LABORATORY PANEL:   CBC  Recent Labs Lab 07/23/15 0527  WBC 5.3  HGB 10.1*  HCT 31.5*  PLT 169   ------------------------------------------------------------------------------------------------------------------  Chemistries   Recent Labs Lab 07/19/15 0433  07/24/15 0434  NA 131*  < > 137  K 3.0*  < > 2.5*  CL 97*  < > 99*  CO2 26  < > 29  GLUCOSE 64*  < > 166*  BUN 34*  < > 14  CREATININE 2.93*  < > 2.08*  CALCIUM 8.4*  < > 8.2*  MG  --   --  1.8  AST 21  --   --   ALT 12*  --   --   ALKPHOS  82  --   --   BILITOT 0.8  --   --   < > = values in this interval not displayed. ------------------------------------------------------------------------------------------------------------------  Cardiac Enzymes No results for input(s): TROPONINI in the last 168  hours. ------------------------------------------------------------------------------------------------------------------  RADIOLOGY:  Dg Chest Port 1 View  07/23/2015  CLINICAL DATA:  CHF, chronic respiratory failure, chronic renal insufficiency. EXAM: PORTABLE CHEST 1 VIEW COMPARISON:  Portable chest x-ray of July 21, 2015 FINDINGS: The lungs are less well inflated today. There are bilateral pleural effusions. The pulmonary interstitial markings are mildly increased. Confluent alveolar opacities are more conspicuous in the right infrahilar region and left perihilar regions. The cardiac silhouette remains enlarged. The pulmonary vascularity is mildly engorged. A dual-lumen dialysis type catheter placed via the right internal jugular approach has its tip in the junction of the middle and distal portions of the SVC. The bony thorax exhibits no acute abnormality. IMPRESSION: Mild CHF with bilateral pleural effusions. Persistent alveolar opacity bilaterally is worrisome for pneumonia possibly related to aspiration. Electronically Signed   By: David  SwazilandJordan M.D.   On: 07/23/2015 07:40    EKG:   Orders placed or performed during the hospital encounter of 07/17/15  . ED EKG  . ED EKG  . EKG 12-Lead  . EKG 12-Lead    ASSESSMENT AND PLAN:   Principal Problem:  Aspiration pneumonia (HCC) Active Problems:  CKD (chronic kidney disease), stage IV (HCC)  Pressure ulcer  Chronic respiratory failure (HCC)  HTN (hypertension), benign  * aspiration Vs HCAP pneumonia- causing acute on chronic respiratory distress.  Associated sepsis - present on admission- with altered mental status. vanc discontinued as blood cultures negative, on zosyn- stop after 10 days of ABX- after 07/26/15 dose  Swallow evaluation with dysphagia diet, nectar thick\ - for MBSS today  appreciate pulm consult - f/u CXR -with persistent infiltrate, likely from aspiration pneumonia. Also has some pulmonary vascular  congestion and small pleural effusions.  * Chronic respiratory failure  On oxygen, continue. Now weaned to 3L nasal cannula - CXR with pulm vascular congestion- Improved breathing with dialysis - patient also has chronic respiratory failure due to underlying COPD with frequent hypercarbia -BiPAP device has been considered and has been ruled out as an effective treatment due to inability to tolerate and frequent need for it. -Patient will need noninvasive positive pressure ventilation due to severity of her disease state and life-threatening condition including CO2 retention. -Patient will need nocturnal usage of such ventilation and also during the day as needed due to increased probability of acute exacerbations. And without such ventilatory support serious, or death could occur. - care manager trying to arrange that.  * Dysphagia- poor PO intake continues, patient afraid of swallowing as she feels she cannot get anything down. - constant secretions and has a suction catheter beside her at all times - did well with bedside swallow eval, now again with poor intake. - for MBSS today, until then placed on nectar thick liquids and dysphagia diet  * ESRD  on PD- nephro consult. Hemodialysis done again today to see if patient can tolerate sitting in the chair for dialysis as she will need 2 weeks of HD prior to transitioning to PD while family gets the PD orientation -Has peritoneal dialysis catheter and family want to continue to do PD as outpatient Also has a right chest permacath for HD in hospital - appreciate nephrology consult,  -Patient and family want to continue dialysis at this point  * Htn  On Coreg, clonidine and Lasix Also on oral hydralazine.  * CVA Hx  Bedbound- is total care.  Family do not want any rehabilitation. They want to take her home.  * Hx of seizure disorder  On dilantin- will change to oral liquid  * DVT Prophylaxis- SQ Heparin   Cont to monitor,  physical therapy consult when able to Overall poor prognosis. Appreciate palliative care input  PLAN is to discharge home with daughter and niece caring for her- once Bipap is arranged at home. Also patient should be able to sit for HD as outpatient (for 2 weeks while transitioning to hemodialysis)   All the records are reviewed and case discussed with Care Management/Social Workerr. Management plans discussed with the patient, family and they are in agreement.  CODE STATUS: DNR  TOTAL TIME TAKING CARE OF THIS PATIENT: 37 minutes.   POSSIBLE D/C IN 3 DAYS, DEPENDING ON CLINICAL CONDITION.   Enid Baas M.D on 07/24/2015 at 12:09 PM  Between 7am to 6pm - Pager - [phone]  After 6pm go to [email or web address] - password [email or web address] [facility]  [place] [facility]  Office  [phone]  CC: Primary care physician; Jaclyn Shaggy, MD

## 2015-07-24 NOTE — Progress Notes (Signed)
Potassium of 2.5 discussed with Dr. Thedore MinsSingh.  Patient to have dialysis today and will be corrected during that session.

## 2015-07-24 NOTE — Progress Notes (Addendum)
Palliative Medicine Inpatient Consult Follow Up Note   Name: Kristina Wilson Date: 07/24/2015 MRN: [id number]  DOB: [birth year]  Referring Physician: Gladstone Lighter, MD  Palliative Care consult requested for this 81 y.o. female for goals of medical therapy in patient with acute on chronic respiratory failure and end stage renal disease and advanced age.  TODAY'S DISCUSSIONS AND DECISIONS:  I met with pt's daughter in the room and nearby as the patient's condition was changing.  I had waited till later in the day to speak to her, anticipating a report from speech pathologist.  The patient had 'passed' her swallowing bedside eval yesterday, but today, all day, she had trouble with swallowing, so speech was called back to re-evaluate her. It seems that fatigue is the biggest problem and new orders and a downgraded texture for food have been ordered.    That would have been enough in terms of events for the day, but more took place. The pt had HEMOdialysis today --and she cannot sit up for this, so this brings up concerns about the practicality of ongoing HD.  However, pt's daughter wants pt to have Peritoneal Dialysis again at home and she said, "I don't want her brought back for hemodialysis again'. I am not sure what that means, because we did not get to discuss the various 'what if' scenarios that would mean that PD would have to be stopped and HD would be again indicated.  I will talk further with daughter about this. I will also talk with nephrology --so that these discussions can be held between nephrology and daughter also.   Additionally, pt did not tolerate HD well in that her blood pressure began to drop at the time I was seeing her.  I spoke with nursing and with the e-Link phsyician about pt's daughter's expressed desire for a pressor if indicated.    Pt's daughter is NOT at all in a 'Palliative Mode'.    She made this clear in our first meeting (which is detailed in my initial  consult note.).   Neo-synephrine was ordered by e-link MD --since it can be given by peripheral IV --though pt does not as yet have a peripheral IV.  Her manual BP was better than what was registering on the monitor, so perhaps she will not end up getting this afterall.   Also, pt began to struggle and was placed back on BIPAP at request of her daughter.  Pt was trying to pull the mask off, but after a while, she calmed down.    Pt's daughter said to me, "Everyone keeps saying the same things to me.  They say, "She is NINET[age] YEARS OLD."  And they say, "She has been through so much." They also say that, "She may be suffering."  Etc etc. .  BUT my mother is still fighting to live and as long as I see that in her, I am going to keep on pushing for her to stay here longer."  Daughter sees herself as a problem solver for her mother.  She does not like a medical condition that has no solution. She is very concrete in her thought processes, and as such, is a good lay-clinician.  However, the process of approaching death in the elderly is never so neat a set of problems and many situations will have no solutions that do no cause other problems.  I had already had a serious discussion with her about the problems associated with a PEG tube (saliva continues  to go down the windpipe -and a PEG does not fix that, etc). Daughter does NOT want her mother to have a PEG tube or other feeding tube now --BUT she is asking for an MBSS.  She wants the tangible results of an objective test---as she probably feels that a bedside evaluation is more subjective.  I told her that an MBSS could not be performed in her mother's current condition.  Daughter also wants Trilogy for her mother.  I spoke with Care Mgmt and this is being pursued but there is no guarantee this can be worked out.    Also, we need to clarify with everyone caring for pt, the pt is followed by [facility] --which is home health Physical Therapy.  It is operated  by [facility] --but it does NOT make pt a 'hospice pt' in any way.  [facility] is often meant to be a form of home health that can ease someone into coming under hospice care, but this pt will not be eligilbe for hospice care until the pt and daughter decide that a palliative/ comfort approach is indicated.  I think that decision will be forced upon them and it won't come easily or soon.   Daughter wants the following: Aggressive treatment and recovery if possible MBSS Trilogy PD restarted  Home for more PT from [place]  She does not want to stop anything BUT she reluctantly supports her mother's wish for DNR and DNI. She does not want her mother to have a PEG (but she might reverse that opinion --I am not sure).  The patient herself can, at times, express her own wishes (reportedly).  BUT not at the times when I have seen and examined her. She has to be in good shape and quite stable for those kinds of conversations, and that hasn't been the case during my visits.    I hope this clarifies all that I have discussed and learned about goals of care.  I will continue to follow along, but I do not expect things to change from the above anytime soon.  I think that the best conversations to have with pt's daughter will involve discussions of concrete/ tangible medical consequences and potential adverse effects of specific forms of aggressive care. )  IMPRESSION: 1. Acute on Chronic Hypoxic Resp Failure ---due to pneumonia that is worrisome for aspiration pneumonia ---on home O2 at 2.5 LPM 2. ESRD (vs CKD stage IV) ? Its a bit unclear at this time ---she has been on both PD (administered by daughter at home) and HD ---does not need HD today ---daughter would want it continued if needed 3. HTN 4. H/O CVA ---no residual but is bedbound and total care dependent despite no residual 5. H/O Sz disorder on Dilantin 6. Anemia --unclear etiology 7. DJD 8. H/O carotic  endarterectomy 9. CHF ---EF in Oct 2016 was 60-65% 10. OSA 11. Dysphagia 12. GERD 13. Headaches 14. Chronic nausea  REVIEW OF SYSTEMS:  Patient is not able to provide ROS due to critical condition of hypotension and respiratory failure  CODE STATUS: DNR and DNI  Daughter 'prefers' no feeding tubes. BUT otherwise, aggressive care is desired. NOTE --pt is NOT A HOSPICE PT (she is a '[facility]' home PT patient)   PAST MEDICAL HISTORY: Past Medical History  Diagnosis Date  . Muscle weakness   . Chronic respiratory failure ([place])   . Pneumonia   . Sleep apnea   . CHF (congestive heart failure) ([place])   .  CKD (chronic kidney disease), stage IV ([place])   . Dysphagia   . GERD (gastroesophageal reflux disease)   . Back pain   . Epilepsy ([place])   . Hypertension   . Stroke ([place])   . Headache   . Arthritis   . Anemia     iron deficiency anemia  . Complication of anesthesia     difficult waking up after surgery (2013)    PAST SURGICAL HISTORY:  Past Surgical History  Procedure Laterality Date  . Back surgery    . Knee surgery    . Carotid endarterectomy    . Tonsillectomy    . Appendectomy    . Joint replacement Bilateral     Knee Replacements  . Dilation and curettage of uterus    . Breast surgery Right     Breast Biopsy  . Capd insertion N/A 04/12/2015    Procedure: LAPAROSCOPIC INSERTION CONTINUOUS AMBULATORY PERITONEAL DIALYSIS  (CAPD) CATHETER;  Surgeon: Katha Cabal, MD;  Location: [facility] ORS;  Service: Vascular;  Laterality: N/A;  . Peripheral vascular catheterization N/A 05/16/2015    Procedure: Dialysis/Perma Catheter Insertion;  Surgeon: Algernon Huxley, MD;  Location: [facility];  Service: Cardiovascular;  Laterality: N/A;    Vital Signs: BP 134/59 mmHg  Pulse 79  Temp(Src) 97.6 F (36.4 C) (Axillary)  Resp 22  Ht 5' 6"  (1.676 m)  Wt 74.3 kg (163 lb 12.8 oz)  BMI 26.45 kg/m2  SpO2 97% Filed Weights   07/22/15 0500 07/23/15 0500 07/24/15 0500   Weight: 72.9 kg (160 lb 11.5 oz) 73.4 kg (161 lb 13.1 oz) 74.3 kg (163 lb 12.8 oz)    Estimated body mass index is 26.45 kg/(m^2) as calculated from the following:   Height as of this encounter: 5' 6"  (1.676 m).   Weight as of this encounter: 74.3 kg (163 lb 12.8 oz).  PHYSICAL EXAM: In distress in ICU Just placed back on BIPAP Now hypotension is evident She still moves arms and opens eyes and tries to talk  --so I suspect her real BP is better than what is on the monitor (70/30 or so) No JVD Hrt rrr  Lungs sound clear ant ABd soft and NT Ext no cyanosis or mottling No peripheral IV is present  LABS: CBC:    Component Value Date/Time   WBC 5.3 07/23/2015 0527   WBC 9.5 11/21/2014 0446   HGB 10.1* 07/23/2015 0527   HGB 8.0* 11/21/2014 0446   HCT 31.5* 07/23/2015 0527   HCT 24.4* 11/21/2014 0446   PLT 169 07/23/2015 0527   PLT 259 11/21/2014 0446   MCV 94.2 07/23/2015 0527   MCV 98 11/21/2014 0446   NEUTROABS 3.5 06/06/2015 0347   NEUTROABS 7.0* 11/21/2014 0446   LYMPHSABS 1.4 06/06/2015 0347   LYMPHSABS 1.3 11/21/2014 0446   MONOABS 0.9 06/06/2015 0347   MONOABS 0.9 11/21/2014 0446   EOSABS 0.2 06/06/2015 0347   EOSABS 0.2 11/21/2014 0446   BASOSABS 0.1 06/06/2015 0347   BASOSABS 0.1 11/21/2014 0446   BASOSABS 1 05/01/2013 1017   Comprehensive Metabolic Panel:    Component Value Date/Time   NA 137 07/24/2015 0434   NA 134* 11/21/2014 0446   K 2.5* 07/24/2015 0434   K 5.0 11/21/2014 0446   CL 99* 07/24/2015 0434   CL 106 11/21/2014 0446   CO2 29 07/24/2015 0434   CO2 25 11/21/2014 0446   BUN 14 07/24/2015 0434   BUN 53* 11/21/2014 0446   CREATININE  2.08* 07/24/2015 0434   CREATININE 2.14* 11/21/2014 0446   GLUCOSE 166* 07/24/2015 0434   GLUCOSE 92 11/21/2014 0446   CALCIUM 8.2* 07/24/2015 0434   CALCIUM 9.0 11/21/2014 0446   AST 21 07/19/2015 0433   AST 20 11/19/2014 0926   ALT 12* 07/19/2015 0433   ALT 15 11/19/2014 0926   ALKPHOS 82 07/19/2015  0433   ALKPHOS 46 11/19/2014 0926   BILITOT 0.8 07/19/2015 0433   BILITOT 0.5 11/19/2014 0926   PROT 4.6* 07/19/2015 0433   PROT 6.2* 11/19/2014 0926   ALBUMIN 1.9* 07/19/2015 0433   ALBUMIN 3.0* 11/19/2014 0926    More than 50% of the visit was spent in counseling/coordination of care: YES  Time Spent:  35 min

## 2015-07-24 NOTE — Progress Notes (Signed)
[facility]  ROUNDING NOTE   Subjective:   Patient is more alert today Her daughter is at bedside Had multidisciplinary meeting with family, nursing and Dr Nemiah Commander Speech therapy recommends thickened liquids  Objective:  Vital signs in last 24 hours:  Temp:  [97.6 F (36.4 C)-98.3 F (36.8 C)] 97.6 F (36.4 C) (12/21 0800) Pulse Rate:  [31-89] 70 (12/21 1000) Resp:  [14-39] 18 (12/21 1000) BP: (62-146)/(40-99) 121/63 mmHg (12/21 1000) SpO2:  [91 %-100 %] 99 % (12/21 1000) FiO2 (%):  [30 %] 30 % (12/21 1000) Weight:  [74.3 kg (163 lb 12.8 oz)] 74.3 kg (163 lb 12.8 oz) (12/21 0500)  Weight change: 0.9 kg (1 lb 15.8 oz) Filed Weights   07/22/15 0500 07/23/15 0500 07/24/15 0500  Weight: 72.9 kg (160 lb 11.5 oz) 73.4 kg (161 lb 13.1 oz) 74.3 kg (163 lb 12.8 oz)    Intake/Output: I/O last 3 completed shifts: In: 1338.1 [P.O.:180; I.V.:596.1; IV Piggyback:562] Out: -699 [Stool:1]   Intake/Output this shift:     Physical Exam: General: Ill appearing  Head: Normocephalic, atraumatic. Moist oral mucosal membranes  Eyes: Anicteric  Neck: Supple, trachea midline  Lungs:  Bilateral rhonchi, mild basilar crackles  Heart: S1S2 no rubs  Abdomen:  Soft, nontender, BS present   Extremities: 1-2+ peripheral edema.  Neurologic: Arousable, able to communicate and follow commands  Skin: No acute lesions  Access: PD catheter in place, RIJ permcath    Basic Metabolic Panel:  Recent Labs Lab 07/18/15 0657  07/20/15 0500 07/21/15 0526 07/22/15 0434 07/22/15 1625 07/23/15 0527 07/24/15 0434  NA 125*  < > 131* 129* 137  --  136 137  K 2.7*  < > 3.6 3.8 2.9* 3.0* 3.0* 2.5*  CL 91*  < > 95* 96* 99*  --  101 99*  CO2 27  < > --  27 29  GLUCOSE 88  < > 77 119* 92  --  84 166*  BUN 26*  < > 39* 43* 20  --  26* 14  CREATININE 2.62*  < > 3.39* 3.81* 2.42*  --  3.19* 2.08*  CALCIUM 8.8*  < > 8.1* 8.0* 7.9*  --  8.2* 8.2*  MG 2.1  --   --   --   --   --   --   1.8  < > = values in this interval not displayed.  Liver Function Tests:  Recent Labs Lab 07/19/15 0433  AST 21  ALT 12*  ALKPHOS 82  BILITOT 0.8  PROT 4.6*  ALBUMIN 1.9*   No results for input(s): LIPASE, AMYLASE in the last 168 hours. No results for input(s): AMMONIA in the last 168 hours.  CBC:  Recent Labs Lab 07/18/15 0657 07/19/15 0433 07/20/15 0500 07/21/15 0526 07/23/15 0527  WBC 9.0 7.8 7.7 7.2 5.3  HGB 12.0 11.3* 10.8* 10.9* 10.1*  HCT 37.5 34.6* 34.4* 34.5* 31.5*  MCV 95.9 96.1 96.1 95.8 94.2  PLT 233 206 211 194 169    Cardiac Enzymes: No results for input(s): CKTOTAL, CKMB, CKMBINDEX, TROPONINI in the last 168 hours.  BNP: Invalid input(s): POCBNP  CBG:  Recent Labs Lab 07/23/15 1645 07/23/15 1957 07/23/15 2354 07/24/15 0355 07/24/15 0724  GLUCAP 112* 135* 163* 156* 123*    Microbiology: Results for orders placed or performed during the hospital encounter of 07/17/15  Blood culture (routine x 2)     Status: None   Collection Time: 07/17/15  4:02 AM  Result Value Ref Range Status   Specimen Description BLOOD RIGHT HAND  Final   Special Requests BOTTLES DRAWN AEROBIC AND ANAEROBIC  Final   Culture NO GROWTH 5 DAYS  Final   Report Status 07/22/2015 FINAL  Final  Blood culture (routine x 2)     Status: None   Collection Time: 07/17/15  4:02 AM  Result Value Ref Range Status   Specimen Description BLOOD LEFT ANTECUBITAL  Final   Special Requests BOTTLES DRAWN AEROBIC AND ANAEROBIC  Final   Culture  Setup Time   Final    GRAM POSITIVE COCCI IN CLUSTERS AEROBIC BOTTLE ONLY CRITICAL RESULT CALLED TO, READ BACK BY AND VERIFIED WITH: MARCELLA TURNER AT 2253 07/17/2015 BY [facility]K CONFIRMED BY MLZ    Culture   Final    COAGULASE NEGATIVE STAPHYLOCOCCUS AEROBIC BOTTLE ONLY Results consistent with contamination.    Report Status 07/22/2015 FINAL  Final  MRSA PCR Screening     Status: Abnormal   Collection Time: 07/17/15  6:51 AM   Result Value Ref Range Status   MRSA by PCR POSITIVE (A) NEGATIVE Final    Comment:        The GeneXpert MRSA Assay (FDA approved for NASAL specimens only), is one component of a comprehensive MRSA colonization surveillance program. It is not intended to diagnose MRSA infection nor to guide or monitor treatment for MRSA infections. CRITICAL RESULT CALLED TO, READ BACK BY AND VERIFIED WITH: Yehuda Savannah @ 07/16/16 by [facility]   Culture, sputum-assessment     Status: None   Collection Time: 07/17/15  5:44 PM  Result Value Ref Range Status   Specimen Description SPUTUM  Final   Special Requests NONE  Final   Sputum evaluation   Final    Sputum specimen not acceptable for testing.  Please recollect.   CALLED TO MARCELLA TURNER @ 2009 ON 07/17/2015 BY [facility]   Report Status 07/17/2015 FINAL  Final  Culture, blood (single)     Status: None   Collection Time: 07/18/15 10:02 AM  Result Value Ref Range Status   Specimen Description BLOOD LEFT HAND  Final   Special Requests BOTTLES DRAWN AEROBIC AND ANAEROBIC  2CC  Final   Culture NO GROWTH 5 DAYS  Final   Report Status 07/23/2015 FINAL  Final    Coagulation Studies: No results for input(s): LABPROT, INR in the last 72 hours.  Urinalysis: No results for input(s): COLORURINE, LABSPEC, PHURINE, GLUCOSEU, HGBUR, BILIRUBINUR, KETONESUR, PROTEINUR, UROBILINOGEN, NITRITE, LEUKOCYTESUR in the last 72 hours.  Invalid input(s): APPERANCEUR    Imaging: Dg Chest Port 1 View  07/23/2015  CLINICAL DATA:  CHF, chronic respiratory failure, chronic renal insufficiency. EXAM: PORTABLE CHEST 1 VIEW COMPARISON:  Portable chest x-ray of July 21, 2015 FINDINGS: The lungs are less well inflated today. There are bilateral pleural effusions. The pulmonary interstitial markings are mildly increased. Confluent alveolar opacities are more conspicuous in the right infrahilar region and left perihilar regions. The cardiac silhouette remains enlarged. The  pulmonary vascularity is mildly engorged. A dual-lumen dialysis type catheter placed via the right internal jugular approach has its tip in the junction of the middle and distal portions of the SVC. The bony thorax exhibits no acute abnormality. IMPRESSION: Mild CHF with bilateral pleural effusions. Persistent alveolar opacity bilaterally is worrisome for pneumonia possibly related to aspiration. Electronically Signed   By: David  [place] M.D.   On: 07/23/2015 07:40     Medications:   . phenylephrine (NEO-SYNEPHRINE) Adult infusion  Stopped (07/24/15 0704)   . amLODipine  10 mg Oral QPM  . antiseptic oral rinse  7 mL Mouth Rinse BID  . aspirin EC  81 mg Oral Daily  . carvedilol  12.5 mg Oral Q12H  . cloNIDine  0.1 mg Oral TID  . cyanocobalamin  500 mcg Oral Daily  . dextrose  1 ampule Intravenous Once  . dialysis solution 1.5% low-MG/low-CA   Intraperitoneal Q24H  . docusate sodium  100 mg Oral QODAY  . fentaNYL  25 mcg Transdermal Q72H  . fluticasone  2 spray Each Nare Daily  . furosemide  40 mg Oral Daily  . gentamicin cream  1 application Topical Daily  . heparin  5,000 Units Subcutaneous 3 times per day  . hydrALAZINE  100 mg Oral TID  . latanoprost  1 drop Both Eyes QHS  . lidocaine  1 patch Transdermal Q24H  . loratadine  10 mg Oral Daily  . mirtazapine  15 mg Oral QHS  . multivitamin-lutein  1 capsule Oral BID  . pantoprazole (PROTONIX) IV  40 mg Intravenous Q12H  . phenytoin (DILANTIN) IV  300 mg Intravenous QHS  . piperacillin-tazobactam (ZOSYN)  IV  3.375 g Intravenous Q12H  . predniSONE  10 mg Oral Daily  . ascorbic acid  500 mg Oral Daily   acetaminophen, albuterol, hydrALAZINE, lactulose, morphine injection, nitroGLYCERIN, ondansetron **OR** ondansetron (ZOFRAN) IV  Assessment/ Plan:  81 y.o. female with past medical history of hypertension, obstructive sleep apnea, DJD, CVA with residual left sided weakness, carotid stenosis status post L CEA 2/15, seizure disorder,  diastolic congestive heart failure, anemia  [facility] PD [facility]  1. End Stage Renal Disease on PD: Patient's daughter had started PD training as outpatient but got only 2 out of 8 days.   emergent HD 12/18. 2500 cc removed. Breathing is better today Patient appears to continue to want aggressive care as she is aware that quitting dialysis would mean hospice and death in near future.  Plan: Trial of HD today to see if she can tolerate sitting in chair for prescribed time Transition to PD as outpatient- Back up HD on Monday and Wed.  PD training Tuesday, Wed, Thursday Family working to [facility]   2. Hypertension: Allergy to enalapril (causes anaphylaxis). Blood pressures now at goal.  - home regimen of amlodipine, carvedilol, clonidine, furosemide, hydralazine.   3. Secondary hyperparathyroidism. Phos and calcium low outpatient due to poor nutritional status. Not currently on binders.   4. Anemia chronic kidney disease:   - low dose epo with HD.   5.  Bacterial pneumonia/respiratory failure:  Suspect aspiration.  Abx continued  6. Hypokalemia - replaced iv last night - 4 K dialysis  7. Palliative care team following  8. Acute pulm edema - now off of BiPAP - 2500 cc removed with HD  - yesterday 700 cc removed - improved     LOS: 7 Starlette Thurow 12/21/201611:09 AM

## 2015-07-24 NOTE — Progress Notes (Signed)
Dr. Gwenette GreetKalesitti at bedside assessing patient and requested patient be taken off of BiPAP and placed on nasal cannula.  BiPAP to be used at night.  Dr. Gwenette GreetKalesitti, Dr. Thedore MinsSingh, and daughter at bedside discussing plan of care and goals in treatment.

## 2015-07-24 NOTE — Progress Notes (Signed)
Per family meeting patient is to have Barium Swallow this afternoon to further assess swallowing issue. Then patient is to be placed in chair and have hemodialysis this afternoon. The goal is for patient to be discharged before the weekend then to have hemodialysis as outpatient 12/26, 12/28 while the daughter is being educated on how to preform peritoneal dialysis.

## 2015-07-24 NOTE — Progress Notes (Signed)
Nutrition Follow-up  DOCUMENTATION CODES:      INTERVENTION:   Medical Food Supplement: Special educational needs teacherMagic Cup and Mighty Shakes have been ordered to maximize oral nutrition when pt is safe for po intake Coordination of Care: await results of MBSS. Noted PEG tube bas been discusssed with family at some point during this admission, also noted family wants to continue PD as outpatient. Noted PEG cannot be placed unless PD catheter is removed per MD notes. MD is aware of pt poor po intake per notes. Continue to assess   NUTRITION DIAGNOSIS:   Inadequate oral intake related to inability to eat as evidenced by NPO status.  GOAL:   Patient will meet greater than or equal to 90% of their needs  MONITOR:    (Energy Intake, Electrolyte and renal Profile, Anthropometrics)  REASON FOR ASSESSMENT:   Diagnosis, Malnutrition Screening Tool (Renal Diet)    ASSESSMENT:   Pt admitted with difficulty breathing with possible aspiration pna. Per H&P [facility]. [facility] with the intent of using PD at home. Pt on isolation currently for MRSA. SLP following.  Pt currently on Bipap, HD as scheduled, currently not doing PD. Plan for MBSS later today per nsg notes. Noted plan is for discharge home  Diet Order:  DIET - DYS 1 Room service appropriate?: Yes with Assist; Fluid consistency:: Nectar Thick  Energy Intake: recorded po intake 0-10% of meals, diet downgraded from Dysphagia II with Thins to Dysphagia I and Nectar Thick yesterday. Pt currently on Bipap and unable to take breakfast. Per MD, Plan to attempt trial of HFNC around lunch time today to see if pt able to take any po  Skin:   (Stage III coccyx pressure ulcer)  Last BM:  unknown   Electrolyte and Renal Profile:  Recent Labs Lab 07/18/15 0657  07/22/15 0434 07/22/15 1625 07/23/15 0527 07/24/15 0434  BUN 26*  < > 20  --  26* 14  CREATININE 2.62*  <  > 2.42*  --  3.19* 2.08*  NA 125*  < > 137  --  136 137  K 2.7*  < > 2.9* 3.0* 3.0* 2.5*  MG 2.1  --   --   --   --  1.8  < > = values in this interval not displayed. Glucose Profile:  Recent Labs  07/23/15 2354 07/24/15 0355 07/24/15 0724  GLUCAP 163* 156* 123*   Meds: prednisone, MVI, remeron, lasix, colace  Height:   Ht Readings from Last 1 Encounters:  07/18/15 5\' 6"  (1.676 m)    Weight:   Wt Readings from Last 1 Encounters:  07/24/15 163 lb 12.8 oz (74.3 kg)    Ideal Body Weight:     BMI:  Body mass index is 26.45 kg/(m^2).  Estimated Nutritional Needs:   Kcal:  BEE: 1027kcals, TEE: (IF 1.2-1.4)(AF 1.2) 1478-1725kcals, using IBW of 59kg  Protein:  70-89g protein (1.2-1.5g/kg) using IBW of 59kg  Fluid:  UOP+1L  EDUCATION NEEDS:   No education needs identified at this time   HIGH Care Level  Romelle Starcherate Tallia Moehring MS, RD, LDN [phone] Pager  [phone] Weekend/On-Call Pager

## 2015-07-24 NOTE — Progress Notes (Signed)
eLink Physician-Brief Progress Note Patient Name: Kristina Wilson DOB: 06/06/1923 MRN: [id number]   Date of Service  07/24/2015  HPI/Events of Note  K+ = 2.5 and Creatinine = 2.08. Patient on RRT.  eICU Interventions  Will order KCl 20 meq IV over 2 hours. Further management of K+ by renal service.     Intervention Category Major Interventions: Electrolyte abnormality - evaluation and management  Sommer,Steven Eugene 07/24/2015, 5:32 AM

## 2015-07-24 NOTE — Progress Notes (Signed)
Pharmacy Antibiotic Follow-up Note  Kristina Wilson is a 81 y.o. year-old female admitted on 07/17/2015.  The patient is currently on day 8 of Zosyn for HCAP vs aspiration PNA.  Assessment/Plan: After discussion with Dr. Dema SeverinMungal, the length of therapy for Zosyn for HCAP is 10 days. The stop date is entered and will be 12/23. Will continue Zosyn dosing of 3.375 g EI q 12 hours until previously specified stop date.   Temp (24hrs), Avg:97.8 F (36.6 C), Min:97.6 F (36.4 C), Max:98.3 F (36.8 C)   Recent Labs Lab 07/18/15 0657 07/19/15 0433 07/20/15 0500 07/21/15 0526 07/23/15 0527  WBC 9.0 7.8 7.7 7.2 5.3    Recent Labs Lab 07/20/15 0500 07/21/15 0526 07/22/15 0434 07/23/15 0527 07/24/15 0434  CREATININE 3.39* 3.81* 2.42* 3.19* 2.08*   Estimated Creatinine Clearance: 18.5 mL/min (by C-G formula based on Cr of 2.08).    Allergies  Allergen Reactions  . Enalapril Anaphylaxis  . Tramadol Shortness Of Breath  . Capsaicin Other (See Comments)    Reaction:  Unknown   . Codeine Other (See Comments)    Reaction:  Unknown   . Hydrocodone Other (See Comments)    Reaction:  Unknown   . Vicodin [Hydrocodone-Acetaminophen] Other (See Comments)    Reaction:  Unknown     Antimicrobials this admission: Vancomycin 12/14 >> 12/16 Zosyn 12/14 >>    Microbiology results: 12/14 MRSA PCR: positive 12/14 sputum cx unacceptable 12/14 Blood cx: CNS in 1/2 12/15 Blood cx: negative   Thank you for allowing pharmacy to be a part of this patient's care.  Luisa HartChristy, Dary Dilauro D PharmD 07/24/2015 11:29 AM

## 2015-07-24 NOTE — Progress Notes (Signed)
Daughter to nurses station stating "my Mom is repeating herself and is getting sleepy.  Can you put her back on BiPAP?"  Dr. Thedore MinsSingh and [place] called notified.  Patient placed back on BiPAP and HD postponed until tomorrow.

## 2015-07-24 NOTE — Clinical Social Work Note (Signed)
RN CM has informed CSW that patient will return home in the care of family at discharge. Please reconsult CSW if needed. [name] MSW,LCSW [phone]

## 2015-07-24 NOTE — Progress Notes (Signed)
Palliative Medicine Inpatient Consult Follow Up Note   Name: Kristina Wilson Date: 07/24/2015 MRN: [id number]  DOB: 07-01-1923  Referring Physician: Gladstone Lighter, MD  Palliative Care consult requested for this 82 y.o. female for goals of medical therapy in patient with acute on chronic respiratory failure and end stage renal disease and advanced age.  TODAY'S DISCUSSIONS AND DECISIONS:  Today, I met with pt's daughter and we talked at some length about goals of care again.  I clarified that we do understand that her mother is driving the aggressive care and that we are not in any way pressuring either of them to make less aggressive approaches.  But, she was able to understand that in 95% of cases similar to her mother's a more palliative approach is desired by the patients and/or family members themselves. She and I had a very good talk about her mother being a bit different than others in the same clinical situation.  I informed her that I see her as very methodical and very  'left brain' and almost always operating in a problem-solving mode.  She told me , "You should have seen my diagram I drew last night about the swallowing study and what to do if she passes or fails."  I let her know that this is something we respect--- and yet, there will come a time when things become less clear.  When there are mixed messages coming from her mother (either in her own words, but also possibly in her clinical responses to interventions)  this will be a time to consider possible withdrawal of dialysis and other aggressive interventions. A time for hospice.  I gave the example of pt possibly not doing well on dialysis (either HD or PD) --with a drop in blood pressure or a post dialysis malaise that over time develops into a desire on pt's part to stop dialysis due to how it makes her feel.  She may say she wants it one day --and then say she doesn' want it on another day.    When mixed messages and  ambivalence arises, it is getting close to time to stop.    The other signals that will at some point come involve the 'Catch 22' situations.  This is when solving one problem creates a new problem and there is not a good solution.  This is also a sign of diminishing returns on aggressive interventions and a sign that accepting the dying process might be the thing to do (when that time comes --its not here yet).   She informed me that she has worked out transportation except there is NO [employer] transportation on Monday --so pt will probably need to stay here over Christmas in order to get scheduled dialysis on Monday.  I let Dr. Andrey Farmer and Care Mgr know this.  Daughter does not want pt going home without Trilogy lined up. She has to be approved, however, for this apparatus.  This is in process.  I spoke to Care Manager and Dr. Tressia Miners about daughter's insistence that we let everyone know she cannot go home without Trilogy (and if not that --she wants BIPAP).  We are hopeful pt will qualify as she has needed BIPAP here so often that it is evident she requires more respiratory support.    I have talked today with her doctors and care manager and have met with daughter and talked at length with her.  Goals of care for now remain aggressive except for DNR, DNI, and  no feeding tubes.  We await results of MBSS to be done later today.     IMPRESSION: 1. Acute on Chronic Hypoxic Resp Failure ---due to pneumonia that is worrisome for aspiration pneumonia ---on home O2 at 2.5 LPM 2. ESRD (vs CKD stage IV) ? Its a bit unclear at this time ---she has been on both PD (administered by daughter at home) and HD ---does not need HD today ---daughter would want it continued if needed 3. HTN 4. H/O CVA ---no residual but is bedbound and total care dependent despite no residual 5. H/O Sz disorder on Dilantin 6. Anemia --unclear etiology 7. DJD 8. H/O carotic endarterectomy 9. CHF ---EF in Oct 2016  was 60-65% 10. OSA 11. Dysphagia 12. GERD 13. Headaches 14. Chronic nausea    REVIEW OF SYSTEMS:  Patient is not able to provide ROS due to lethargy/ sleeping state.  CODE STATUS: DNR and DNI  Daughter 'prefers' no feeding tubes. BUT otherwise, aggressive care is desired. NOTE --pt is NOT A HOSPICE PT (she is a '[facility]' home PT patient)    PAST MEDICAL HISTORY: Past Medical History  Diagnosis Date  . Muscle weakness   . Chronic respiratory failure ([place])   . Pneumonia   . Sleep apnea   . CHF (congestive heart failure) ([place])   . CKD (chronic kidney disease), stage IV ([place])   . Dysphagia   . GERD (gastroesophageal reflux disease)   . Back pain   . Epilepsy ([place])   . Hypertension   . Stroke ([place])   . Headache   . Arthritis   . Anemia     iron deficiency anemia  . Complication of anesthesia     difficult waking up after surgery (2013)    PAST SURGICAL HISTORY:  Past Surgical History  Procedure Laterality Date  . Back surgery    . Knee surgery    . Carotid endarterectomy    . Tonsillectomy    . Appendectomy    . Joint replacement Bilateral     Knee Replacements  . Dilation and curettage of uterus    . Breast surgery Right     Breast Biopsy  . Capd insertion N/A 04/12/2015    Procedure: LAPAROSCOPIC INSERTION CONTINUOUS AMBULATORY PERITONEAL DIALYSIS  (CAPD) CATHETER;  Surgeon: Katha Cabal, MD;  Location: [facility] ORS;  Service: Vascular;  Laterality: N/A;  . Peripheral vascular catheterization N/A 05/16/2015    Procedure: Dialysis/Perma Catheter Insertion;  Surgeon: Algernon Huxley, MD;  Location: [facility];  Service: Cardiovascular;  Laterality: N/A;    Vital Signs: BP 124/66 mmHg  Pulse 79  Temp(Src) 97.6 F (36.4 C) (Axillary)  Resp 16  Ht 5' 6"  (1.676 m)  Wt 74.3 kg (163 lb 12.8 oz)  BMI 26.45 kg/m2  SpO2 98% Filed Weights   07/22/15 0500 07/23/15 0500 07/24/15 0500  Weight: 72.9 kg (160 lb 11.5 oz) 73.4 kg (161 lb 13.1 oz) 74.3  kg (163 lb 12.8 oz)    Estimated body mass index is 26.45 kg/(m^2) as calculated from the following:   Height as of this encounter: 5' 6"  (1.676 m).   Weight as of this encounter: 74.3 kg (163 lb 12.8 oz).  PHYSICAL EXAM: Looking more comfortable in ICU bed --sleeping again with head of bed up Eyes closed No JVD or TM Hrt rrr no m Lungs cta ant Abd soft and NT Ext no mottling or cyanosis Skin warm and dry  LABS: CBC:    Component Value  Date/Time   WBC 5.3 07/23/2015 0527   WBC 9.5 11/21/2014 0446   HGB 10.1* 07/23/2015 0527   HGB 8.0* 11/21/2014 0446   HCT 31.5* 07/23/2015 0527   HCT 24.4* 11/21/2014 0446   PLT 169 07/23/2015 0527   PLT 259 11/21/2014 0446   MCV 94.2 07/23/2015 0527   MCV 98 11/21/2014 0446   NEUTROABS 3.5 06/06/2015 0347   NEUTROABS 7.0* 11/21/2014 0446   LYMPHSABS 1.4 06/06/2015 0347   LYMPHSABS 1.3 11/21/2014 0446   MONOABS 0.9 06/06/2015 0347   MONOABS 0.9 11/21/2014 0446   EOSABS 0.2 06/06/2015 0347   EOSABS 0.2 11/21/2014 0446   BASOSABS 0.1 06/06/2015 0347   BASOSABS 0.1 11/21/2014 0446   BASOSABS 1 05/01/2013 1017   Comprehensive Metabolic Panel:    Component Value Date/Time   NA 137 07/24/2015 0434   NA 134* 11/21/2014 0446   K 2.5* 07/24/2015 0434   K 5.0 11/21/2014 0446   CL 99* 07/24/2015 0434   CL 106 11/21/2014 0446   CO2 29 07/24/2015 0434   CO2 25 11/21/2014 0446   BUN 14 07/24/2015 0434   BUN 53* 11/21/2014 0446   CREATININE 2.08* 07/24/2015 0434   CREATININE 2.14* 11/21/2014 0446   GLUCOSE 166* 07/24/2015 0434   GLUCOSE 92 11/21/2014 0446   CALCIUM 8.2* 07/24/2015 0434   CALCIUM 9.0 11/21/2014 0446   AST 21 07/19/2015 0433   AST 20 11/19/2014 0926   ALT 12* 07/19/2015 0433   ALT 15 11/19/2014 0926   ALKPHOS 82 07/19/2015 0433   ALKPHOS 46 11/19/2014 0926   BILITOT 0.8 07/19/2015 0433   BILITOT 0.5 11/19/2014 0926   PROT 4.6* 07/19/2015 0433   PROT 6.2* 11/19/2014 0926   ALBUMIN 1.9* 07/19/2015 0433   ALBUMIN  3.0* 11/19/2014 0926    More than 50% of the visit was spent in counseling/coordination of care: YES  Time Spent:  65 min

## 2015-07-24 NOTE — Evaluation (Signed)
Objective Swallowing Evaluation: MBS-Modified Barium Swallow Study  Patient Details  Name: Kristina Wilson MRN: [id number] Date of Birth: 10-Jan-1923  Today's Date: 07/24/2015 Time: SLP Start Time (ACUTE ONLY): 1400-SLP Stop Time (ACUTE ONLY): 1500 SLP Time Calculation (min) (ACUTE ONLY): 60 min  Past Medical History:  Past Medical History  Diagnosis Date  . Muscle weakness   . Chronic respiratory failure ([facility])   . Pneumonia   . Sleep apnea   . CHF (congestive heart failure) ([facility])   . CKD (chronic kidney disease), stage IV ([facility])   . Dysphagia   . GERD (gastroesophageal reflux disease)   . Back pain   . Epilepsy ([facility])   . Hypertension   . Stroke ([facility])   . Headache   . Arthritis   . Anemia     iron deficiency anemia  . Complication of anesthesia     difficult waking up after surgery (2013)   Past Surgical History:  Past Surgical History  Procedure Laterality Date  . Back surgery    . Knee surgery    . Carotid endarterectomy    . Tonsillectomy    . Appendectomy    . Joint replacement Bilateral     Knee Replacements  . Dilation and curettage of uterus    . Breast surgery Right     Breast Biopsy  . Capd insertion N/A 04/12/2015    Procedure: LAPAROSCOPIC INSERTION CONTINUOUS AMBULATORY PERITONEAL DIALYSIS  (CAPD) CATHETER;  Surgeon: [name], MD;  Location: [facility] ORS;  Service: Vascular;  Laterality: N/A;  . Peripheral vascular catheterization N/A 05/16/2015    Procedure: Dialysis/Perma Catheter Insertion;  Surgeon: Annice Needy, MD;  Location: [facility];  Service: Cardiovascular;  Laterality: N/A;   HPI: Pt is a 81 y.o. female with a known history of end-stage renal disease on peritoneal dialysis, chronic respiratory failure on home oxygen, hypertension, CVA, history of congestive heart failure, epilepsy, chronic anemia, bedbound, on total care dependent, on DO NOT RESUSCITATE status was brought in with the complaints of cough and congestion with  shortness of breath following episode of emesis at around 3 AM today. According to the patient's daughter who is her care giver at home, she has been having ongoing nausea for the past few days and she had an episode of emesis following which she developed coughing spell with shortness of breath and hypoxia, hence brought to the emergency room. Patient was recently discharged from [facility] to home and was initiated on peritoneal dialysis 2 days ago. Prior to that she was on hemodialysis for the past one and half months while she was at [facility]. She does have a history of chronic respiratory failure and uses home oxygen 2 and half liters. Pt awakened for SLP; extremently HOH. She answered only a few basic questions and attempted to follow commands but did not interact much. Suspect d/t her severe HOH status. She was able to indicate her wants/needs re: foods and drink. Noted mild belching and increased time cleaning her mouth b/t trials.  Subjective: pt awake, verbally responsive w/ staff. HOH. Alert and followed general instructions.   Assessment / Plan / Recommendation  CHL IP CLINICAL IMPRESSIONS 07/24/2015  Therapy Diagnosis Moderate oral phase dysphagia;Moderate pharyngeal phase dysphagia  Clinical Impression Pt presented w/ moderate oropharyngeal phase dysphagia c/b delayed pharyngeal swallow initiation w/ reduced airway protection during the swallowing resulting in Aspiration of thin liquids during the swallow. Pt appeared to have a slightly more timely pharyngeal swallow intiation  w/ trials of Nectar consistency liquids via TSP(monitoring bolus volume) w/ no Aspiration noted of trials during this study. Of note, pt's cough was somewhat decreased in effort and congested decreasing its effectiveness for airway clearing and protection. Pt exhibited decreased BOT strength and pressure during the swallow(suspect d/t lengthy illness and overall weakness) resulting in min.-mod. lingual residue  post swallow. With cues to use a lingual sweep and f/u, dry swallow, pt was able to clear the bolus residue from the BOT(and min. amount in the valleculae which had pooled there). Suspect pt is at increased risk for laryngeal penetration/aspiration of this lingual residue b/t boluses if not cleared. Pt responded well to instruction/cues to use lingual sweeping and a f/u, dry swallow. Pt tolerated TSP boluses of purees and Nectar consistency liquids fed to her. Of note, a softened bolus trial of mech soft was given w/ increased oral phase time/effort for mastication. Would not rec'd increased textured foods at this time sec. to the effort and fatigue factor pt would face w/ such. Pt appears to present w/ increased risk for aspiration w/ po intake but this risk if reduced when following aspiration precautions and compensatory strategies. These results and recommendations were discussed w/ Dtr, NSG and MD post study. ST will f/u w/ continued education and toleration of diet.   Impact on safety and function Moderate aspiration risk      CHL IP TREATMENT RECOMMENDATION 07/24/2015  Treatment Recommendations Therapy as outlined in treatment plan below     Prognosis 07/24/2015  Prognosis for Safe Diet Advancement Fair  Barriers to Reach Goals (No Data)  Barriers/Prognosis Comment --    CHL IP DIET RECOMMENDATION 07/24/2015  SLP Diet Recommendations Dysphagia 1 (Puree) solids;Nectar thick liquid  Liquid Administration via Spoon  Medication Administration Crushed with puree  Compensations Minimize environmental distractions;Slow rate;Small sips/bites;Lingual sweep for clearance of pocketing;Multiple dry swallows after each bite/sip  Postural Changes Seated upright at 90 degrees      CHL IP OTHER RECOMMENDATIONS 07/24/2015  Recommended Consults (No Data)  Oral Care Recommendations Oral care BID;Staff/trained caregiver to provide oral care  Other Recommendations Order thickener from pharmacy;Prohibited  food (jello, ice cream, thin soups);Remove water pitcher      CHL IP FOLLOW UP RECOMMENDATIONS 07/24/2015  Follow up Recommendations (No Data)      CHL IP FREQUENCY AND DURATION 07/24/2015  Speech Therapy Frequency (ACUTE ONLY) min 3x week  Treatment Duration 1 week           No flowsheet data found.  No flowsheet data found.   CHL IP CERVICAL ESOPHAGEAL PHASE 07/24/2015  Cervical Esophageal Phase WFL  Pudding Teaspoon --  Pudding Cup --  Honey Teaspoon --  Honey Cup --  Nectar Teaspoon --  Nectar Cup --  Nectar Straw --  Thin Teaspoon --  Thin Cup --  Thin Straw --  Puree --  Mechanical Soft --  Regular --  Multi-consistency --  Pill --  Cervical Esophageal Comment --       Jerilynn SomKatherine Watson, MS, CCC-SLP  Watson,Katherine 07/24/2015, 3:45 PM

## 2015-07-24 NOTE — Care Management (Signed)
Updated patient's daughter Tresa EndoKelly on discharge plan.  Patient is going to require at least 2 weeks of hemodialysis after discharge while family is taught peritoneal dialysis.  Informed patient would receive 3 HD treatments a week- 3 hours long.  ICU staff already aware that patient must be able to sit for dialysis. Triology referral is still in progress. CM is now questioning if the triology and prn need for it is going to adversely affect ability to receive hemodialysis in aoiutpatient center.  This will need to be assessed prior to discharge.  Patient uses [facility] for transport and if HD is scheduled for Monday, there would be no transportation

## 2015-07-25 DIAGNOSIS — J189 Pneumonia, unspecified organism: Secondary | ICD-10-CM

## 2015-07-25 LAB — CBC
HCT: 34.8 % — ABNORMAL LOW (ref 35.0–47.0)
Hemoglobin: 11 g/dL — ABNORMAL LOW (ref 12.0–16.0)
MCH: 30 pg (ref 26.0–34.0)
MCHC: 31.7 g/dL — ABNORMAL LOW (ref 32.0–36.0)
MCV: 94.7 fL (ref 80.0–100.0)
PLATELETS: 161 10*3/uL (ref 150–440)
RBC: 3.67 MIL/uL — ABNORMAL LOW (ref 3.80–5.20)
RDW: 18.5 % — AB (ref 11.5–14.5)
WBC: 6.8 10*3/uL (ref 3.6–11.0)

## 2015-07-25 LAB — BASIC METABOLIC PANEL
Anion gap: 7 (ref 5–15)
BUN: 19 mg/dL (ref 6–20)
CALCIUM: 8.5 mg/dL — AB (ref 8.9–10.3)
CO2: 28 mmol/L (ref 22–32)
Chloride: 98 mmol/L — ABNORMAL LOW (ref 101–111)
Creatinine, Ser: 2.93 mg/dL — ABNORMAL HIGH (ref 0.44–1.00)
GFR calc Af Amer: 15 mL/min — ABNORMAL LOW (ref >60)
GFR, EST NON AFRICAN AMERICAN: 13 mL/min — AB (ref >60)
GLUCOSE: 104 mg/dL — AB (ref 65–99)
Potassium: 2.7 mmol/L — CL (ref 3.5–5.1)
Sodium: 133 mmol/L — ABNORMAL LOW (ref 135–145)

## 2015-07-25 LAB — BLOOD GAS, ARTERIAL
ALLENS TEST (PASS/FAIL): POSITIVE — AB
Acid-Base Excess: 7.6 mmol/L — ABNORMAL HIGH (ref 0.0–3.0)
Bicarbonate: 33.6 mEq/L — ABNORMAL HIGH (ref 21.0–28.0)
FIO2: 0.24
O2 SAT: 97.6 %
PCO2 ART: 53 mmHg — AB (ref 32.0–48.0)
PH ART: 7.41 (ref 7.350–7.450)
PO2 ART: 97 mmHg (ref 83.0–108.0)
Patient temperature: 37

## 2015-07-25 LAB — POTASSIUM: Potassium: 3 mmol/L — ABNORMAL LOW (ref 3.5–5.1)

## 2015-07-25 LAB — GLUCOSE, CAPILLARY
GLUCOSE-CAPILLARY: 95 mg/dL (ref 65–99)
GLUCOSE-CAPILLARY: 110 mg/dL — AB (ref 65–99)
Glucose-Capillary: 86 mg/dL (ref 65–99)
Glucose-Capillary: 99 mg/dL (ref 65–99)
Glucose-Capillary: 92 mg/dL (ref 65–99)

## 2015-07-25 MED ORDER — POTASSIUM CHLORIDE CRYS ER 20 MEQ PO TBCR
20.0000 meq | EXTENDED_RELEASE_TABLET | Freq: Once | ORAL | Status: AC
  Administered 2015-07-25: 20 meq via ORAL
  Filled 2015-07-25: qty 1

## 2015-07-25 MED ORDER — ASPIRIN 81 MG PO CHEW
81.0000 mg | CHEWABLE_TABLET | Freq: Every day | ORAL | Status: DC
Start: 2015-07-25 — End: 2015-07-31
  Administered 2015-07-25 – 2015-07-29 (×5): 81 mg via ORAL
  Filled 2015-07-25 (×5): qty 1

## 2015-07-25 MED ORDER — DOCUSATE SODIUM 50 MG/5ML PO LIQD
100.0000 mg | ORAL | Status: DC
  Administered 2015-07-25 – 2015-07-29 (×3): 100 mg via ORAL
  Filled 2015-07-25 (×4): qty 10

## 2015-07-25 NOTE — Progress Notes (Signed)
Pt potassium level 3.0 spoke with Dr.Signh new orders for potassium replacement po  x1  And check BMP in am

## 2015-07-25 NOTE — Progress Notes (Signed)
HD Tx Terminated

## 2015-07-25 NOTE — Progress Notes (Signed)
Paged, nephrologist, Dr. Thedore MinsSingh about patient's critical potassium value of 2.7. Patient currently receiving dialysis treatment with 4K dialysate. Labs drawn at the start of treatment. Awaiting callback.

## 2015-07-25 NOTE — Progress Notes (Signed)
Spoke with Dr. Thedore MinsSingh about patient's critically low potassium value of 2.7. Patient is still receiving treatment with the 4K dialysate. MD acknowledged and ordered a recheck of her potassium at 20:00. He did not order any potassium replacement at this time, stating that he wanted to see what her potassium value would be this evening after dialysis.

## 2015-07-25 NOTE — Progress Notes (Signed)
Post HD Tx Assessment  

## 2015-07-25 NOTE — Consult Note (Signed)
[facility] [place] [facility] Consultation      Name: Kristina Wilson MRN: [id number] DOB: 1923-04-14    ADMISSION DATE:  07/17/2015   REFERRING MD :  Dr. Desiree Hane   CHIEF COMPLAINT:   Acute resp failure   HISTORY OF PRESENT ILLNESS  Intermittent use of bipap More alert and awake this morning.  Patient foll Follow up  Patient Review of Systems  Constitutional: Negative for fever and chills.  HENT: Negative for ear discharge and ear pain.   Eyes: Negative for blurred vision.  Respiratory: Positive for cough and shortness of breath. Negative for hemoptysis.   Gastrointestinal: Negative for heartburn and nausea.  Genitourinary: Positive for dysuria.  Neurological: Positive for weakness. Negative for dizziness and headaches.  Endo/Heme/Allergies: Does not bruise/bleed easily.      VITAL SIGNS    Temp:  [97.6 F (36.4 C)-97.8 F (36.6 C)] 97.8 F (36.6 C) (12/21 2200) Pulse Rate:  [43-79] 69 (12/21 2200) Resp:  [13-22] 15 (12/21 2200) BP: (121-153)/(53-99) 150/93 mmHg (12/21 2200) SpO2:  [95 %-99 %] 98 % (12/21 2200) FiO2 (%):  [30 %] 30 % (12/21 1100) Weight:  [165 lb 2 oz (74.9 kg)] 165 lb 2 oz (74.9 kg) (12/22 0500) HEMODYNAMICS:   VENTILATOR SETTINGS: Vent Mode:  [-]  FiO2 (%):  [30 %] 30 % INTAKE / OUTPUT: No intake or output data in the 24 hours ending 07/25/15 0716     PHYSICAL EXAM   Physical Exam  HENT:  Head: Normocephalic and atraumatic.  Eyes: Pupils are equal, round, and reactive to light.  Neck: Normal range of motion. Neck supple.  Cardiovascular: Normal rate and regular rhythm.   No murmur heard. Pulmonary/Chest: She has no wheezes. She has rales.  resp distress  Abdominal: Soft. She exhibits no distension. There is no tenderness.  Musculoskeletal: She exhibits no edema.  Neurological: She is alert. She displays normal reflexes. Coordination normal.  Skin: Skin is warm.       LABS   LABS:  CBC  Recent Labs Lab  07/20/15 0500 07/21/15 0526 07/23/15 0527  WBC 7.7 7.2 5.3  HGB 10.8* 10.9* 10.1*  HCT 34.4* 34.5* 31.5*  PLT 211 194 169   Coag's No results for input(s): APTT, INR in the last 168 hours. BMET  Recent Labs Lab 07/22/15 0434 07/22/15 1625 07/23/15 0527 07/24/15 0434  NA 137  --  136 137  K 2.9* 3.0* 3.0* 2.5*  CL 99*  --  101 99*  CO2 27  --  27 29  BUN 20  --  26* 14  CREATININE 2.42*  --  3.19* 2.08*  GLUCOSE 92  --  84 166*   Electrolytes  Recent Labs Lab 07/22/15 0434 07/23/15 0527 07/24/15 0434  CALCIUM 7.9* 8.2* 8.2*  MG  --   --  1.8   Sepsis Markers No results for input(s): LATICACIDVEN, PROCALCITON, O2SATVEN in the last 168 hours. ABG  Recent Labs Lab 07/19/15 1135 07/21/15 1350 07/22/15 1655  PHART 7.31* 7.28* 7.32*  PCO2ART 57* 51* 53*  PO2ART 77* 93 47*   Liver Enzymes  Recent Labs Lab 07/19/15 0433  AST 21  ALT 12*  ALKPHOS 82  BILITOT 0.8  ALBUMIN 1.9*   Cardiac Enzymes No results for input(s): TROPONINI, PROBNP in the last 168 hours. Glucose  Recent Labs Lab 07/24/15 0355 07/24/15 0724 07/24/15 1155 07/24/15 1618 07/24/15 1944 07/25/15 0555  GLUCAP 156* 123* 122* 130* 112* 95     Recent Results (from the  past 240 hour(s))  Blood culture (routine x 2)     Status: None   Collection Time: 07/17/15  4:02 AM  Result Value Ref Range Status   Specimen Description BLOOD RIGHT HAND  Final   Special Requests BOTTLES DRAWN AEROBIC AND ANAEROBIC  Final   Culture NO GROWTH 5 DAYS  Final   Report Status 07/22/2015 FINAL  Final  Blood culture (routine x 2)     Status: None   Collection Time: 07/17/15  4:02 AM  Result Value Ref Range Status   Specimen Description BLOOD LEFT ANTECUBITAL  Final   Special Requests BOTTLES DRAWN AEROBIC AND ANAEROBIC  Final   Culture  Setup Time   Final    GRAM POSITIVE COCCI IN CLUSTERS AEROBIC BOTTLE ONLY CRITICAL RESULT CALLED TO, READ BACK BY AND VERIFIED WITH: MARCELLA TURNER AT  2253 07/17/2015 BY TFK CONFIRMED BY MLZ    Culture   Final    COAGULASE NEGATIVE STAPHYLOCOCCUS AEROBIC BOTTLE ONLY Results consistent with contamination.    Report Status 07/22/2015 FINAL  Final  MRSA PCR Screening     Status: Abnormal   Collection Time: 07/17/15  6:51 AM  Result Value Ref Range Status   MRSA by PCR POSITIVE (A) NEGATIVE Final    Comment:        The GeneXpert MRSA Assay (FDA approved for NASAL specimens only), is one component of a comprehensive MRSA colonization surveillance program. It is not intended to diagnose MRSA infection nor to guide or monitor treatment for MRSA infections. CRITICAL RESULT CALLED TO, READ BACK BY AND VERIFIED WITH: Yehuda Savannah @ 07/16/16 by [facility]   Culture, sputum-assessment     Status: None   Collection Time: 07/17/15  5:44 PM  Result Value Ref Range Status   Specimen Description SPUTUM  Final   Special Requests NONE  Final   Sputum evaluation   Final    Sputum specimen not acceptable for testing.  Please recollect.   CALLED TO MARCELLA TURNER @ 2009 ON 07/17/2015 BY CAF   Report Status 07/17/2015 FINAL  Final  Culture, blood (single)     Status: None   Collection Time: 07/18/15 10:02 AM  Result Value Ref Range Status   Specimen Description BLOOD LEFT HAND  Final   Special Requests BOTTLES DRAWN AEROBIC AND ANAEROBIC  2CC  Final   Culture NO GROWTH 5 DAYS  Final   Report Status 07/23/2015 FINAL  Final     Current facility-administered medications:  .  acetaminophen (TYLENOL) tablet 650 mg, 650 mg, Oral, TID PRN, Crissie Figures, MD .  albuterol (PROVENTIL) (2.5 MG/3ML) 0.083% nebulizer solution 2.5 mg, 2.5 mg, Nebulization, Q2H PRN, Crissie Figures, MD, 2.5 mg at 07/17/15 2105 .  amLODipine (NORVASC) tablet 10 mg, 10 mg, Oral, QPM, Crissie Figures, MD, 10 mg at 07/23/15 2139 .  antiseptic oral rinse (CPC / CETYLPYRIDINIUM CHLORIDE 0.05%) solution 7 mL, 7 mL, Mouth Rinse, BID, Altamese Dilling, MD, 7 mL at 07/24/15  2232 .  aspirin EC tablet 81 mg, 81 mg, Oral, Daily, Crissie Figures, MD, 81 mg at 07/24/15 0851 .  carvedilol (COREG) tablet 12.5 mg, 12.5 mg, Oral, Q12H, Crissie Figures, MD, 12.5 mg at 07/24/15 2231 .  cloNIDine (CATAPRES) tablet 0.1 mg, 0.1 mg, Oral, TID, Crissie Figures, MD, 0.1 mg at 07/24/15 2230 .  cyanocobalamin tablet 500 mcg, 500 mcg, Oral, Daily, Crissie Figures, MD, 500 mcg at 07/24/15 0851 .  dextrose 50 %  solution 50 mL, 1 ampule, Intravenous, Once, Lamont DowdySarath Kolluru, MD, 50 mL at 07/21/15 1241 .  docusate sodium (COLACE) capsule 100 mg, 100 mg, Oral, QODAY, Crissie FiguresEdavally N Reddy, MD, 100 mg at 07/23/15 1435 .  fentaNYL (DURAGESIC - dosed mcg/hr) 25 mcg, 25 mcg, Transdermal, Q72H, Crissie FiguresEdavally N Reddy, MD, 25 mcg at 07/23/15 0518 .  fluticasone (FLONASE) 50 MCG/ACT nasal spray 2 spray, 2 spray, Each Nare, Daily, Crissie FiguresEdavally N Reddy, MD, 2 spray at 07/24/15 0851 .  furosemide (LASIX) tablet 40 mg, 40 mg, Oral, Daily, Crissie FiguresEdavally N Reddy, MD, 40 mg at 07/23/15 1440 .  gentamicin cream (GARAMYCIN) 0.1 % 1 application, 1 application, Topical, Daily, Munsoor Lateef, MD, 1 application at 07/24/15 0852 .  heparin injection 5,000 Units, 5,000 Units, Subcutaneous, 3 times per day, Crissie FiguresEdavally N Reddy, MD, 5,000 Units at 07/25/15 0550 .  hydrALAZINE (APRESOLINE) injection 10 mg, 10 mg, Intravenous, Q6H PRN, Enid Baasadhika Kalisetti, MD .  hydrALAZINE (APRESOLINE) tablet 100 mg, 100 mg, Oral, TID, Crissie FiguresEdavally N Reddy, MD, 100 mg at 07/24/15 2242 .  lactulose (CHRONULAC) 10 GM/15ML solution 20 g, 20 g, Oral, Daily PRN, Crissie FiguresEdavally N Reddy, MD .  latanoprost (XALATAN) 0.005 % ophthalmic solution 1 drop, 1 drop, Both Eyes, QHS, Crissie FiguresEdavally N Reddy, MD, 1 drop at 07/24/15 2231 .  lidocaine (LIDODERM) 5 % 1 patch, 1 patch, Transdermal, Q24H, Enid Baasadhika Kalisetti, MD, 1 patch at 07/24/15 2242 .  loratadine (CLARITIN) tablet 10 mg, 10 mg, Oral, Daily, Crissie FiguresEdavally N Reddy, MD, 10 mg at 07/24/15 [id number] .  mirtazapine (REMERON) tablet 15 mg, 15  mg, Oral, QHS, Crissie FiguresEdavally N Reddy, MD, 15 mg at 07/24/15 2231 .  morphine 2 MG/ML injection 2-4 mg, 2-4 mg, Intravenous, Q1H PRN, Erin FullingKurian Kasa, MD, 2 mg at 07/22/15 0846 .  multivitamin-lutein (OCUVITE-LUTEIN) capsule 1 capsule, 1 capsule, Oral, BID, Crissie FiguresEdavally N Reddy, MD, 1 capsule at 07/24/15 2242 .  nitroGLYCERIN (NITROSTAT) SL tablet 0.4 mg, 0.4 mg, Sublingual, Q5 min PRN, Crissie FiguresEdavally N Reddy, MD .  ondansetron (ZOFRAN) tablet 4 mg, 4 mg, Oral, Q6H PRN **OR** ondansetron (ZOFRAN) injection 4 mg, 4 mg, Intravenous, Q6H PRN, Crissie FiguresEdavally N Reddy, MD .  pantoprazole (PROTONIX) injection 40 mg, 40 mg, Intravenous, Q12H, Erin FullingKurian Kasa, MD, 40 mg at 07/24/15 2230 .  phenylephrine (NEO-SYNEPHRINE) 10 mg in dextrose 5 % 250 mL (0.04 mg/mL) infusion, 30-200 mcg/min, Intravenous, Continuous, Kalman ShanMurali Ramaswamy, MD, Stopped at 07/24/15 0704 .  phenytoin (DILANTIN) 125 MG/5ML suspension 300 mg, 300 mg, Oral, QHS, Enid Baasadhika Kalisetti, MD, 300 mg at 07/24/15 2230 .  piperacillin-tazobactam (ZOSYN) IVPB 3.375 g, 3.375 g, Intravenous, Q12H, Shakaria Raphael, MD, 3.375 g at 07/24/15 2242 .  predniSONE (DELTASONE) tablet 10 mg, 10 mg, Oral, Daily, Crissie FiguresEdavally N Reddy, MD, 10 mg at 07/24/15 [id number] .  vitamin C (ASCORBIC ACID) tablet 500 mg, 500 mg, Oral, Daily, Crissie FiguresEdavally N Reddy, MD, 500 mg at 07/24/15 0851     INDWELLING DEVICES:  MICRO DATA: MRSA PCR >>pos Urine  Blood Resp   ANTIMICROBIALS: vanc/zosyn 12/14>>    ASSESSMENT/PLAN   81 yo white female with progressive resp failure and progressive encephalopathy from acute resp failure From acute aspiration pneumonia in the setting of ESRD on HD  PULMONARY Patient is DNR/DNI -bipap and oxygen as needed -wean as tolerated -neb therapy as needed -empiric abx therapy for asp pneumonia -may have some lung irritation from asp PNA causing pneumonitis type picture, and requiring Bipap and supplemental O2. Given her advanced age and deconditioning, this will take longer  to  resolve -daughter is requesting either triology machine or bipap prior to [facility] - Child psychotherapist currently arranging.  -Bipap Qhs and PRN during the day.   CARDIOVASCULAR Not in shock at this time  RENAL ESRD on HD -follow up nephrology recs   GASTROINTESTINAL SUP Follow up swallow study.   HEMATOLOGIC Follow CBC  INFECTIOUS aspirtaion pneumonia -empiric abx   ENDOCRINE - ICU hypoglycemic\Hyperglycemia protocol   NEUROLOGIC -Encephalopathy - improving -give her prolong hospitalization, I suspect she will recover slowly back to baseline mentation   SOCIAL - appreciate palliative care recs - DNR/DNI, full medical management.   The Patient requires high complexity decision making for assessment and support, frequent evaluation and titration of therapies, application of advanced monitoring technologies and extensive interpretation of multiple databases. Critical Care Time devoted to patient care services described in this note is 45 minutes.   Overall, patient is critically ill, prognosis is guarded. Patient at high risk for cardiac arrest and death.    Stephanie Acre, MD [place] [facility] Pager [phone] (please enter 7-digits) On Call Pager - [phone] (please enter 7-digits)

## 2015-07-25 NOTE — Progress Notes (Signed)
[facility]  ROUNDING NOTE   Subjective:   Patient is sleepy but arousable She is able to answer questions Dialysis was deferred yesterday because patient had to be placed back on BiPAP At present, patient denies any acute shortness of breath or pain  Objective:  Vital signs in last 24 hours:  Temp:  [97.8 F (36.6 C)] 97.8 F (36.6 C) (12/21 2200) Pulse Rate:  [43-83] 69 (12/22 0900) Resp:  [13-18] 15 (12/22 0900) BP: (123-153)/(49-93) 124/49 mmHg (12/22 0900) SpO2:  [96 %-100 %] 100 % (12/22 0900) FiO2 (%):  [30 %] 30 % (12/21 1100) Weight:  [74.9 kg (165 lb 2 oz)] 74.9 kg (165 lb 2 oz) (12/22 0500)  Weight change: 0.6 kg (1 lb 5.2 oz) Filed Weights   07/23/15 0500 07/24/15 0500 07/25/15 0500  Weight: 73.4 kg (161 lb 13.1 oz) 74.3 kg (163 lb 12.8 oz) 74.9 kg (165 lb 2 oz)    Intake/Output: I/O last 3 completed shifts: In: 1053.4 [I.V.:547.4; IV Piggyback:506] Out: -    Intake/Output this shift:  Total I/O In: 20 [P.O.:20] Out: -   Physical Exam: General: Ill appearing  Head: Normocephalic, atraumatic. Moist oral mucosal membranes  Eyes: Anicteric  Neck: Supple, trachea midline  Lungs:   normal respiratory effort, clear anteriorly and laterally   Heart: S1S2 no rubs  Abdomen:  Soft, nontender, BS present   Extremities:  trace peripheral edema.  Neurologic: Arousable, able to communicate and follow commands  Skin: No acute lesions  Access: PD catheter in place, RIJ permcath    Basic Metabolic Panel:  Recent Labs Lab 07/20/15 0500 07/21/15 0526 07/22/15 0434 07/22/15 1625 07/23/15 0527 07/24/15 0434  NA 131* 129* 137  --  136 137  K 3.6 3.8 2.9* 3.0* 3.0* 2.5*  CL 95* 96* 99*  --  101 99*  CO2 24 22 27   --  27 29  GLUCOSE 77 119* 92  --  84 166*  BUN 39* 43* 20  --  26* 14  CREATININE 3.39* 3.81* 2.42*  --  3.19* 2.08*  CALCIUM 8.1* 8.0* 7.9*  --  8.2* 8.2*  MG  --   --   --   --   --  1.8    Liver Function Tests:  Recent  Labs Lab 07/19/15 0433  AST 21  ALT 12*  ALKPHOS 82  BILITOT 0.8  PROT 4.6*  ALBUMIN 1.9*   No results for input(s): LIPASE, AMYLASE in the last 168 hours. No results for input(s): AMMONIA in the last 168 hours.  CBC:  Recent Labs Lab 07/19/15 0433 07/20/15 0500 07/21/15 0526 07/23/15 0527  WBC 7.8 7.7 7.2 5.3  HGB 11.3* 10.8* 10.9* 10.1*  HCT 34.6* 34.4* 34.5* 31.5*  MCV 96.1 96.1 95.8 94.2  PLT 206 211 194 169    Cardiac Enzymes: No results for input(s): CKTOTAL, CKMB, CKMBINDEX, TROPONINI in the last 168 hours.  BNP: Invalid input(s): POCBNP  CBG:  Recent Labs Lab 07/24/15 1155 07/24/15 1618 07/24/15 1944 07/25/15 0555 07/25/15 0726  GLUCAP 122* 130* 112* 95 86    Microbiology: Results for orders placed or performed during the hospital encounter of 07/17/15  Blood culture (routine x 2)     Status: None   Collection Time: 07/17/15  4:02 AM  Result Value Ref Range Status   Specimen Description BLOOD RIGHT HAND  Final   Special Requests BOTTLES DRAWN AEROBIC AND ANAEROBIC 5ML  Final   Culture NO GROWTH 5 DAYS  Final  Report Status 07/22/2015 FINAL  Final  Blood culture (routine x 2)     Status: None   Collection Time: 07/17/15  4:02 AM  Result Value Ref Range Status   Specimen Description BLOOD LEFT ANTECUBITAL  Final   Special Requests BOTTLES DRAWN AEROBIC AND ANAEROBIC  Final   Culture  Setup Time   Final    GRAM POSITIVE COCCI IN CLUSTERS AEROBIC BOTTLE ONLY CRITICAL RESULT CALLED TO, READ BACK BY AND VERIFIED WITH: MARCELLA TURNER AT 2253 07/17/2015 BY TFK CONFIRMED BY MLZ    Culture   Final    COAGULASE NEGATIVE STAPHYLOCOCCUS AEROBIC BOTTLE ONLY Results consistent with contamination.    Report Status 07/22/2015 FINAL  Final  MRSA PCR Screening     Status: Abnormal   Collection Time: 07/17/15  6:51 AM  Result Value Ref Range Status   MRSA by PCR POSITIVE (A) NEGATIVE Final    Comment:        The GeneXpert MRSA Assay  (FDA approved for NASAL specimens only), is one component of a comprehensive MRSA colonization surveillance program. It is not intended to diagnose MRSA infection nor to guide or monitor treatment for MRSA infections. CRITICAL RESULT CALLED TO, READ BACK BY AND VERIFIED WITH: Yehuda Savannah @ 07/16/16 by [facility]   Culture, sputum-assessment     Status: None   Collection Time: 07/17/15  5:44 PM  Result Value Ref Range Status   Specimen Description SPUTUM  Final   Special Requests NONE  Final   Sputum evaluation   Final    Sputum specimen not acceptable for testing.  Please recollect.   CALLED TO MARCELLA TURNER @ 2009 ON 07/17/2015 BY CAF   Report Status 07/17/2015 FINAL  Final  Culture, blood (single)     Status: None   Collection Time: 07/18/15 10:02 AM  Result Value Ref Range Status   Specimen Description BLOOD LEFT HAND  Final   Special Requests BOTTLES DRAWN AEROBIC AND ANAEROBIC  2CC  Final   Culture NO GROWTH 5 DAYS  Final   Report Status 07/23/2015 FINAL  Final    Coagulation Studies: No results for input(s): LABPROT, INR in the last 72 hours.  Urinalysis: No results for input(s): COLORURINE, LABSPEC, PHURINE, GLUCOSEU, HGBUR, BILIRUBINUR, KETONESUR, PROTEINUR, UROBILINOGEN, NITRITE, LEUKOCYTESUR in the last 72 hours.  Invalid input(s): APPERANCEUR    Imaging: No results found.   Medications:   . phenylephrine (NEO-SYNEPHRINE) Adult infusion Stopped (07/24/15 0704)   . amLODipine  10 mg Oral QPM  . antiseptic oral rinse  7 mL Mouth Rinse BID  . aspirin EC  81 mg Oral Daily  . carvedilol  12.5 mg Oral Q12H  . cloNIDine  0.1 mg Oral TID  . cyanocobalamin  500 mcg Oral Daily  . dextrose  1 ampule Intravenous Once  . docusate sodium  100 mg Oral QODAY  . fentaNYL  25 mcg Transdermal Q72H  . fluticasone  2 spray Each Nare Daily  . furosemide  40 mg Oral Daily  . gentamicin cream  1 application Topical Daily  . heparin  5,000 Units Subcutaneous 3 times per day  .  hydrALAZINE  100 mg Oral TID  . latanoprost  1 drop Both Eyes QHS  . lidocaine  1 patch Transdermal Q24H  . loratadine  10 mg Oral Daily  . mirtazapine  15 mg Oral QHS  . multivitamin-lutein  1 capsule Oral BID  . pantoprazole (PROTONIX) IV  40 mg Intravenous Q12H  . phenytoin  300 mg Oral QHS  . piperacillin-tazobactam (ZOSYN)  IV  3.375 g Intravenous Q12H  . predniSONE  10 mg Oral Daily  . ascorbic acid  500 mg Oral Daily   acetaminophen, albuterol, hydrALAZINE, lactulose, morphine injection, nitroGLYCERIN, ondansetron **OR** ondansetron (ZOFRAN) IV  Assessment/ Plan:  82 y.o. female with past medical history of hypertension, obstructive sleep apnea, DJD, CVA with residual left sided weakness, carotid stenosis status post L CEA 2/15, seizure disorder, diastolic congestive heart failure, anemia  [facility] PD [facility]  1. End Stage Renal Disease on PD: Patient's daughter had started PD training as outpatient but got only 2 out of 8 days.   emergent HD 12/18. 2500 cc removed. Breathing is better today Patient appears to continue to want aggressive care as she is aware that quitting dialysis would mean hospice and death in near future.  Plan: Trial of HD today to see if she can tolerate sitting in chair for prescribed time Transition to PD as outpatient- Back up HD on Wed.  PD training Tuesday, Wed, Thursday Family working to set up [employer] transport   2. Hypertension: Allergy to enalapril (causes anaphylaxis). Blood pressures now at goal.  - home regimen of amlodipine, carvedilol, clonidine, furosemide, hydralazine.  - If blood pressure tends to decrease, would discontinue clonidine and decrease the dose of hydralazine   3. Secondary hyperparathyroidism. Phos and calcium low outpatient due to poor nutritional status. Not currently on binders.   4. Anemia chronic kidney disease:   - low dose epo with HD.   5.  Bacterial pneumonia/respiratory failure:  Suspect aspiration.  Abx  continued  6. Hypokalemia - replaced iv   - 4 K dialysis  7. Palliative care team following  8. Acute pulm edema - now off of BiPAP - 2500 cc removed with HD        LOS: 8 Charmagne Buhl 12/22/201610:48 AM

## 2015-07-25 NOTE — Progress Notes (Signed)
Follow up on current [facility] patient who is currently in CCU 1 at [facility]. Patient sleepy today- Did not have HD yesterday as she was placed back on BiPap for respiratory distress. Patient off Bipap now and on 2L oxygen via [place]. Planning for trial HD today and continued education to caregiver for PD at home or outpatient when discharged. Patient wants to continue aggressive treatment and does not want hospice services at this time per MD report. Will continue to follow through final disposition.

## 2015-07-25 NOTE — Progress Notes (Signed)
Post HD Tx Notes 

## 2015-07-25 NOTE — Care Management (Signed)
Informed that it appears that patient will be approved for the Triology.  Will have final determination  12/23 morning

## 2015-07-25 NOTE — Progress Notes (Signed)
Pre HD Tx Machine & Patient Checks 

## 2015-07-25 NOTE — Progress Notes (Signed)
Pharmacy Antibiotic Follow-up Note  Kristina Wilson is a 81 y.o. year-old female admitted on 07/17/2015.  The patient is currently on day 9 of Zosyn for HCAP vs aspiration PNA.  Assessment/Plan: After discussion with Dr. Dema SeverinMungal, the length of therapy for Zosyn for HCAP is 10 days. The stop date is entered and will be 12/24. Will continue Zosyn dosing of 3.375 g EI q 12 hours until previously specified stop date.   Temp (24hrs), Avg:97.8 F (36.6 C), Min:97.8 F (36.6 C), Max:97.8 F (36.6 C)   Recent Labs Lab 07/19/15 0433 07/20/15 0500 07/21/15 0526 07/23/15 0527  WBC 7.8 7.7 7.2 5.3     Recent Labs Lab 07/20/15 0500 07/21/15 0526 07/22/15 0434 07/23/15 0527 07/24/15 0434  CREATININE 3.39* 3.81* 2.42* 3.19* 2.08*   Estimated Creatinine Clearance: 18.6 mL/min (by C-G formula based on Cr of 2.08).    Allergies  Allergen Reactions  . Enalapril Anaphylaxis  . Tramadol Shortness Of Breath  . Capsaicin Other (See Comments)    Reaction:  Unknown   . Codeine Other (See Comments)    Reaction:  Unknown   . Hydrocodone Other (See Comments)    Reaction:  Unknown   . Vicodin [Hydrocodone-Acetaminophen] Other (See Comments)    Reaction:  Unknown     Antimicrobials this admission: Vancomycin 12/14 >> 12/16 Zosyn 12/14 >>    Microbiology results: 12/14 MRSA PCR: positive 12/14 sputum cx unacceptable 12/14 Blood cx: CNS in 1/2 12/15 Blood cx: negative   Thank you for allowing pharmacy to be a part of this patient's care.  Luisa HartChristy, Shayle Donahoo D PharmD 07/25/2015 10:20 AM

## 2015-07-25 NOTE — Progress Notes (Signed)
HD Tx Initiated 

## 2015-07-25 NOTE — Progress Notes (Signed)
[facility] -  at [facility]   PATIENT NAME: Kristina Wilson    MR#:  [id number]  DATE OF BIRTH:  1923/07/29  SUBJECTIVE:  CHIEF COMPLAINT:   Chief Complaint  Patient presents with  . Shortness of Breath   - patient not as perky as yesterday, attempting to see if she can sit for dialysis today - minimal PO intake   REVIEW OF SYSTEMS:  Review of Systems  Constitutional: Negative for fever and chills.  HENT: Negative for ear pain and tinnitus.        Increased oral secretions  Eyes: Negative for blurred vision.  Respiratory: Negative for cough, shortness of breath and wheezing.   Cardiovascular: Negative for chest pain, palpitations and leg swelling.  Gastrointestinal: Positive for nausea. Negative for vomiting, abdominal pain, diarrhea and constipation.  Genitourinary: Negative for dysuria.  Musculoskeletal: Negative for myalgias.  Neurological: Positive for weakness. Negative for dizziness, speech change, focal weakness, seizures and headaches.  Psychiatric/Behavioral: Negative for depression.    DRUG ALLERGIES:   Allergies  Allergen Reactions  . Enalapril Anaphylaxis  . Tramadol Shortness Of Breath  . Capsaicin Other (See Comments)    Reaction:  Unknown   . Codeine Other (See Comments)    Reaction:  Unknown   . Hydrocodone Other (See Comments)    Reaction:  Unknown   . Vicodin [Hydrocodone-Acetaminophen] Other (See Comments)    Reaction:  Unknown     VITALS:  Blood pressure 117/89, pulse 82, temperature 96.4 F (35.8 C), temperature source Axillary, resp. rate 25, height  (1.676 m), weight 74.9 kg (165 lb 2 oz), SpO2 97 %.  PHYSICAL EXAMINATION:  Physical Exam  GENERAL:  81 y.o.-year-old patient lying in the bed with no acute distress.  EYES: Pupils equal, round, reactive to light and accommodation. No scleral icterus. Extraocular muscles intact.  HEENT: Head atraumatic, normocephalic. Oropharynx and nasopharynx clear.  Increased oral secretions NECK:  Supple, no jugular venous distention. No thyroid enlargement, no tenderness.  LUNGS: Normal breath sounds bilaterally, decreased bibasilar breath sounds, rhonchi heard diffusely,  no wheezing. No use of accessory muscles of respiration.  CARDIOVASCULAR: S1, S2 normal. No rubs, or gallops. 3/6 systolic murmur present ABDOMEN: Soft, nontender, nondistended. Bowel sounds present. No organomegaly or mass.  EXTREMITIES: No cyanosis, or clubbing. 1+ pedal edema present Complains of tenderness when her legs are being examined NEUROLOGIC: patient is alert, following commands. Cranial nerves II through XII are intact. No focal deficits noted. Sensation intact. Gait not checked.  PSYCHIATRIC: The patient is more alert today, appears weak, more interactive and asking questions SKIN: No obvious rash, lesion, or ulcer.    LABORATORY PANEL:   CBC  Recent Labs Lab 07/23/15 0527  WBC 5.3  HGB 10.1*  HCT 31.5*  PLT 169   ------------------------------------------------------------------------------------------------------------------  Chemistries   Recent Labs Lab 07/19/15 0433  07/24/15 0434  NA 131*  < > 137  K 3.0*  < > 2.5*  CL 97*  < > 99*  CO2 26  < > 29  GLUCOSE 64*  < > 166*  BUN 34*  < > 14  CREATININE 2.93*  < > 2.08*  CALCIUM 8.4*  < > 8.2*  MG  --   --  1.8  AST 21  --   --   ALT 12*  --   --   ALKPHOS 82  --   --   BILITOT 0.8  --   --   < > = values  in this interval not displayed. ------------------------------------------------------------------------------------------------------------------  Cardiac Enzymes No results for input(s): TROPONINI in the last 168 hours. ------------------------------------------------------------------------------------------------------------------  RADIOLOGY:  No results found.  EKG:   Orders placed or performed during the hospital encounter of 07/17/15  . ED EKG  . ED EKG  . EKG 12-Lead  . EKG  12-Lead    ASSESSMENT AND PLAN:   Principal Problem:  Aspiration pneumonia (HCC) Active Problems:  CKD (chronic kidney disease), stage IV (HCC)  Pressure ulcer  Chronic respiratory failure (HCC)  HTN (hypertension), benign  * aspiration Vs HCAP pneumonia- causing acute on chronic respiratory distress.  Associated sepsis - present on admission- with altered mental status. vanc discontinued as blood cultures negative, on zosyn- stop after 10 days of ABX- after 07/26/15 dose  Swallow evaluation with dysphagia diet, nectar thick\  appreciate pulm consult - f/u CXR -with persistent infiltrate, likely from aspiration pneumonia. Also has some pulmonary vascular congestion and small pleural effusions.  * Chronic respiratory failure  On oxygen, continue. Now weaned to 3L nasal cannula - CXR with pulm vascular congestion- Improved breathing with dialysis - patient also has chronic respiratory failure due to underlying COPD with frequent hypercarbia -BiPAP device has been considered and has been ruled out as an effective treatment due to inability to tolerate and frequent need for it. -Patient will need noninvasive positive pressure ventilation due to severity of her disease state and life-threatening condition including CO2 retention. -Patient will need nocturnal usage of such ventilation and also during the day as needed due to increased probability of acute exacerbations. And without such ventilatory support serious, or death could occur. - care manager trying to arrange that.  * Dysphagia- poor PO intake continues, patient afraid of swallowing as she feels she cannot get anything down. - constant secretions and has a suction catheter beside her at all times - poor intake. -had MBSS, on nectar thick liquids and dysphagia diet  * ESRD  on PD- nephro consult. Hemodialysis  today to see if patient can tolerate sitting in the chair for dialysis as she will need 2 weeks of HD prior to  transitioning to PD while family gets the PD orientation -Has peritoneal dialysis catheter and family want to continue to do PD as outpatient Also has a right chest permacath for HD in hospital - appreciate nephrology consult,  -Patient and family want to continue dialysis at this point  * Htn  On Coreg, clonidine and Lasix Also on oral hydralazine.  * CVA Hx  Bedbound- is total care.  Family do not want any rehabilitation. They want to take her home.  * Hx of seizure disorder  On dilantin- will change to oral liquid  * DVT Prophylaxis- SQ Heparin   Cont to monitor, physical therapy consult when able to Overall poor prognosis. Appreciate palliative care input Check with daughter to see if they would consider LTAC  PLAN is to discharge home with daughter and niece caring for her- once Bipap is arranged at home. Also patient should be able to sit for HD as outpatient (for 2 weeks while transitioning to hemodialysis)   All the records are reviewed and case discussed with Care Management/Social Workerr. Management plans discussed with the patient, family and they are in agreement.  CODE STATUS: DNR  TOTAL TIME TAKING CARE OF THIS PATIENT: 37 minutes.     Enid BaasKALISETTI,Kayleigh Broadwell M.D on 07/25/2015 at 1:35 PM  Between 7am to 6pm - Pager - [phone]  After 6pm go to [email or web address] -  password [email or web address] [facility]  [place] [facility]  Office  [phone]  CC: Primary care physician; Albina Billet, MD

## 2015-07-25 NOTE — Progress Notes (Signed)
Pt resting quietly slept well this shift pt remained on Bipap until 6am  Then placed on 2lnc sats 98%. No pain or distress noted.

## 2015-07-25 NOTE — Progress Notes (Signed)
Repaged Dr. Thedore MinsSingh about critical value of potassium of 2.7 (drawn prior to start of dialysis).

## 2015-07-25 NOTE — Progress Notes (Signed)
Pre HD Tx Assessment 

## 2015-07-26 ENCOUNTER — Inpatient Hospital Stay: Payer: Medicare Other

## 2015-07-26 DIAGNOSIS — J9601 Acute respiratory failure with hypoxia: Secondary | ICD-10-CM

## 2015-07-26 LAB — GLUCOSE, CAPILLARY
GLUCOSE-CAPILLARY: 111 mg/dL — AB (ref 65–99)
GLUCOSE-CAPILLARY: 113 mg/dL — AB (ref 65–99)
GLUCOSE-CAPILLARY: 95 mg/dL (ref 65–99)
Glucose-Capillary: 87 mg/dL (ref 65–99)

## 2015-07-26 LAB — BASIC METABOLIC PANEL
Anion gap: 5 (ref 5–15)
BUN: 10 mg/dL (ref 6–20)
CALCIUM: 8.5 mg/dL — AB (ref 8.9–10.3)
CO2: 31 mmol/L (ref 22–32)
CREATININE: 1.96 mg/dL — AB (ref 0.44–1.00)
Chloride: 104 mmol/L (ref 101–111)
GFR calc non Af Amer: 21 mL/min — ABNORMAL LOW (ref >60)
GFR, EST AFRICAN AMERICAN: 25 mL/min — AB (ref >60)
Glucose, Bld: 98 mg/dL (ref 65–99)
Potassium: 3.3 mmol/L — ABNORMAL LOW (ref 3.5–5.1)
SODIUM: 140 mmol/L (ref 135–145)

## 2015-07-26 LAB — CBC
HCT: 32.2 % — ABNORMAL LOW (ref 35.0–47.0)
Hemoglobin: 10.4 g/dL — ABNORMAL LOW (ref 12.0–16.0)
MCH: 30.1 pg (ref 26.0–34.0)
MCHC: 32.3 g/dL (ref 32.0–36.0)
MCV: 93 fL (ref 80.0–100.0)
Platelets: 145 10*3/uL — ABNORMAL LOW (ref 150–440)
RBC: 3.46 MIL/uL — ABNORMAL LOW (ref 3.80–5.20)
RDW: 18.2 % — AB (ref 11.5–14.5)
WBC: 6.6 10*3/uL (ref 3.6–11.0)

## 2015-07-26 MED ORDER — HEPARIN SODIUM (PORCINE) 5000 UNIT/ML IJ SOLN
5000.0000 [IU] | Freq: Two times a day (BID) | INTRAMUSCULAR | Status: DC
Start: 2015-07-26 — End: 2015-07-31
  Administered 2015-07-26 – 2015-07-31 (×6): 5000 [IU] via SUBCUTANEOUS
  Filled 2015-07-26 (×6): qty 1

## 2015-07-26 NOTE — Progress Notes (Signed)
[facility] - Post at [facility]   PATIENT NAME: Danice GoltzJacquelyn Sangster    MR#:  [id number]  DATE OF BIRTH:  1922/08/09  SUBJECTIVE:  CHIEF COMPLAINT:   Chief Complaint  Patient presents with  . Shortness of Breath   -about the same as yesterday, minimal PO intake noted. - potassium being adjusted. Patient sat for dialysis for 2 hours today.   REVIEW OF SYSTEMS:  Review of Systems  Constitutional: Negative for fever and chills.  HENT: Negative for ear pain and tinnitus.        Increased oral secretions  Eyes: Negative for blurred vision.  Respiratory: Negative for cough, shortness of breath and wheezing.   Cardiovascular: Negative for chest pain, palpitations and leg swelling.  Gastrointestinal: Positive for nausea. Negative for vomiting, abdominal pain, diarrhea and constipation.  Genitourinary: Negative for dysuria.  Musculoskeletal: Negative for myalgias.  Neurological: Positive for weakness. Negative for dizziness, speech change, focal weakness, seizures and headaches.  Psychiatric/Behavioral: Negative for depression.    DRUG ALLERGIES:   Allergies  Allergen Reactions  . Enalapril Anaphylaxis  . Tramadol Shortness Of Breath  . Capsaicin Other (See Comments)    Reaction:  Unknown   . Codeine Other (See Comments)    Reaction:  Unknown   . Hydrocodone Other (See Comments)    Reaction:  Unknown   . Vicodin [Hydrocodone-Acetaminophen] Other (See Comments)    Reaction:  Unknown     VITALS:  Blood pressure 128/46, pulse 81, temperature 98.6 F (37 C), temperature source Oral, resp. rate 20, height 5\' 6"  (1.676 m), weight 74.9 kg (165 lb 2 oz), SpO2 92 %.  PHYSICAL EXAMINATION:  Physical Exam  GENERAL:  81 y.o.-year-old patient lying in the bed with no acute distress.  EYES: Pupils equal, round, reactive to light and accommodation. No scleral icterus. Extraocular muscles intact.  HEENT: Head atraumatic, normocephalic. Oropharynx and  nasopharynx clear. Increased oral secretions NECK:  Supple, no jugular venous distention. No thyroid enlargement, no tenderness.  LUNGS: Normal breath sounds bilaterally, decreased bibasilar breath sounds, rhonchi heard diffusely,  no wheezing. No use of accessory muscles of respiration.  CARDIOVASCULAR: S1, S2 normal. No rubs, or gallops. 3/6 systolic murmur present ABDOMEN: Soft, nontender, nondistended. Bowel sounds present. No organomegaly or mass.  EXTREMITIES: No cyanosis, or clubbing. 1+ pedal edema present Complains of tenderness when her legs are being examined NEUROLOGIC: patient is alert, following commands. Cranial nerves II through XII are intact. No focal deficits noted. Sensation intact. Gait not checked.  PSYCHIATRIC: The patient is more alert today, appears weak, more interactive and asking questions SKIN: No obvious rash, lesion, or ulcer.    LABORATORY PANEL:   CBC  Recent Labs Lab 07/26/15 0716  WBC 6.6  HGB 10.4*  HCT 32.2*  PLT 145*   ------------------------------------------------------------------------------------------------------------------  Chemistries   Recent Labs Lab 07/24/15 0434  07/26/15 0716  NA 137  < > 140  K 2.5*  < > 3.3*  CL 99*  < > 104  CO2 29  < > 31  GLUCOSE 166*  < > 98  BUN 14  < > 10  CREATININE 2.08*  < > 1.96*  CALCIUM 8.2*  < > 8.5*  MG 1.8  --   --   < > = values in this interval not displayed. ------------------------------------------------------------------------------------------------------------------  Cardiac Enzymes No results for input(s): TROPONINI in the last 168 hours. ------------------------------------------------------------------------------------------------------------------  RADIOLOGY:  Dg Chest Port 1 View  07/26/2015  CLINICAL DATA:  Acute respiratory  failure, aspiration pneumonia, chronic renal insufficiency. EXAM: PORTABLE CHEST 1 VIEW COMPARISON:  Portable chest x-ray of July 23, 2015  FINDINGS: The lungs are mildly hypoinflated. Confluent alveolar infiltrate persists at the left lung base with increased interstitial density in the left perihilar region. Coarse right perihilar and infrahilar lung markings are slightly more conspicuous today. The cardiac silhouette remains enlarged. There is dense calcification of the mitral valvular annulus. The pulmonary vascularity more prominent today. The dual-lumen dialysis type catheter tip projects over the midportion of the SVC. IMPRESSION: Interval increase in bibasilar pneumonia. Perihilar interstitial infiltrate or atelectasis is more conspicuous as well. Mild CHF more conspicuous than on the previous study. Electronically Signed   By: David  [place] M.D.   On: 07/26/2015 16:11    EKG:   Orders placed or performed during the hospital encounter of 07/17/15  . ED EKG  . ED EKG  . EKG 12-Lead  . EKG 12-Lead    ASSESSMENT AND PLAN:   Principal Problem:  Aspiration pneumonia (HCC) Active Problems:  CKD (chronic kidney disease), stage IV (HCC)  Pressure ulcer  Chronic respiratory failure (HCC)  HTN (hypertension), benign  * aspiration Vs HCAP pneumonia- causing acute on chronic respiratory distress.  Associated sepsis - present on admission- with altered mental status. vanc discontinued as blood cultures negative, on zosyn- stop after 10 days of ABX- after 07/26/15 dose  Swallow evaluation with dysphagia diet, nectar thick\  appreciate pulm consult - f/u CXR -with persistent infiltrate, likely from aspiration pneumonia. Also has some pulmonary vascular congestion and small pleural effusions.  * Chronic respiratory failure  On oxygen, continue. Now weaned to 3L nasal cannula - CXR with pulm vascular congestion- Improved breathing with dialysis - patient also has chronic respiratory failure due to underlying COPD with frequent hypercarbia -BiPAP device has been considered and has been ruled out as an effective treatment  due to inability to tolerate and frequent need for it. -Patient will need noninvasive positive pressure ventilation due to severity of her disease state and life-threatening condition including CO2 retention. -Patient will need nocturnal usage of such ventilation and also during the day as needed due to increased probability of acute exacerbations. And without such ventilatory support serious, or death could occur. - care manager trying to arrange that.  * Dysphagia- poor PO intake continues, patient afraid of swallowing as she feels she cannot get anything down. - constant secretions and has a suction catheter beside her at all times - poor intake. -had MBSS, on nectar thick liquids and dysphagia diet  * ESRD  on PD- nephro consult. Hemodialysis  today to see if patient can tolerate sitting in the chair for dialysis as she will need 2 weeks of HD prior to transitioning to PD while family gets the PD orientation -Has peritoneal dialysis catheter and family want to continue to do PD as outpatient Also has a right chest permacath for HD in hospital - appreciate nephrology consult,  -Patient and family want to continue dialysis at this point  * Htn  On Coreg, clonidine and Lasix Also on oral hydralazine.  * CVA Hx  Bedbound- is total care.  Family do not want any rehabilitation. They want to take her home.  * Hx of seizure disorder  On dilantin- will change to oral liquid  * DVT Prophylaxis- SQ Heparin   Cont to monitor, physical therapy consult when able to Overall poor prognosis. Appreciate palliative care input   PLAN is to discharge home with daughter and  niece caring for her- once Bipap is arranged at home. Also once outpatient hemodialysis is set up. Likely next week   All the records are reviewed and case discussed with Care Management/Social Workerr. Management plans discussed with the patient, family and they are in agreement.  CODE STATUS: DNR  TOTAL TIME  TAKING CARE OF THIS PATIENT: 37 minutes.     Enid Baas M.D on 07/26/2015 at 4:21 PM  Between 7am to 6pm - Pager - [phone]  After 6pm go to [email or web address] - password [email or web address] [facility]  [place] [facility]  Office  [phone]  CC: Primary care physician; Jaclyn Shaggy, MD

## 2015-07-26 NOTE — Progress Notes (Signed)
[facility]  ROUNDING NOTE   Subjective:   Patient is sleepy but arousable She is able to answer questions She was able to tolerate dialysis sitting up in chair yesterday  Objective:  Vital signs in last 24 hours:  Temp:  [97.4 F (36.3 C)-97.7 F (36.5 C)] 97.5 F (36.4 C) (12/23 1209) Pulse Rate:  [69-92] 76 (12/23 1400) Resp:  [13-35] 13 (12/23 1400) BP: (90-161)/(29-141) 102/66 mmHg (12/23 1400) SpO2:  [90 %-100 %] 99 % (12/23 1400) FiO2 (%):  [30 %] 30 % (12/23 1209)  Weight change:  Filed Weights   07/25/15 0500  Weight: 74.9 kg (165 lb 2 oz)    Intake/Output: I/O last 3 completed shifts: In: 200 [P.O.:50; IV Piggyback:150] Out: 0    Intake/Output this shift:  Total I/O In: 40 [P.O.:40] Out: -   Physical Exam: General: Ill appearing  Head:  atraumatic. Moist oral mucosal membranes  Eyes: Anicteric  Neck: Supple, trachea midline  Lungs:   normal respiratory effort, clear anteriorly and laterally   Heart: S1S2 no rubs  Abdomen:  Soft, nontender, BS present   Extremities:  trace peripheral edema.  Neurologic: Arousable, able to communicate and follow commands  Skin: No acute lesions  Access: PD catheter in place, RIJ permcath    Basic Metabolic Panel:  Recent Labs Lab 07/22/15 0434  07/23/15 0527 07/24/15 0434 07/25/15 1326 07/25/15 1951 07/26/15 0716  NA 137  --  136 137 133*  --  140  K 2.9*  < > 3.0* 2.5* 2.7* 3.0* 3.3*  CL 99*  --  101 99* 98*  --  104  CO2 27  --  --  31  GLUCOSE 92  --  84 166* 104*  --  98  BUN 20  --  26* 14 19  --  10  CREATININE 2.42*  --  3.19* 2.08* 2.93*  --  1.96*  CALCIUM 7.9*  --  8.2* 8.2* 8.5*  --  8.5*  MG  --   --   --  1.8  --   --   --   < > = values in this interval not displayed.  Liver Function Tests: No results for input(s): AST, ALT, ALKPHOS, BILITOT, PROT, ALBUMIN in the last 168 hours. No results for input(s): LIPASE, AMYLASE in the last 168 hours. No results for  input(s): AMMONIA in the last 168 hours.  CBC:  Recent Labs Lab 07/20/15 0500 07/21/15 0526 07/23/15 0527 07/25/15 1326 07/26/15 0716  WBC 7.7 7.2 5.3 6.8 6.6  HGB 10.8* 10.9* 10.1* 11.0* 10.4*  HCT 34.4* 34.5* 31.5* 34.8* 32.2*  MCV 96.1 95.8 94.2 94.7 93.0  PLT 211 194 169 161 145*    Cardiac Enzymes: No results for input(s): CKTOTAL, CKMB, CKMBINDEX, TROPONINI in the last 168 hours.  BNP: Invalid input(s): POCBNP  CBG:  Recent Labs Lab 07/25/15 1952 07/26/15 0010 07/26/15 0348 07/26/15 0723 07/26/15 1154  GLUCAP 110* 111* 95 87 113*    Microbiology: Results for orders placed or performed during the hospital encounter of 07/17/15  Blood culture (routine x 2)     Status: None   Collection Time: 07/17/15  4:02 AM  Result Value Ref Range Status   Specimen Description BLOOD RIGHT HAND  Final   Special Requests BOTTLES DRAWN AEROBIC AND ANAEROBIC  Final   Culture NO GROWTH 5 DAYS  Final   Report Status 07/22/2015 FINAL  Final  Blood culture (routine x 2)  Status: None   Collection Time: 07/17/15  4:02 AM  Result Value Ref Range Status   Specimen Description BLOOD LEFT ANTECUBITAL  Final   Special Requests BOTTLES DRAWN AEROBIC AND ANAEROBIC 15ML  Final   Culture  Setup Time   Final    GRAM POSITIVE COCCI IN CLUSTERS AEROBIC BOTTLE ONLY CRITICAL RESULT CALLED TO, READ BACK BY AND VERIFIED WITH: MARCELLA TURNER AT 2253 07/17/2015 BY TFK CONFIRMED BY MLZ    Culture   Final    COAGULASE NEGATIVE STAPHYLOCOCCUS AEROBIC BOTTLE ONLY Results consistent with contamination.    Report Status 07/22/2015 FINAL  Final  MRSA PCR Screening     Status: Abnormal   Collection Time: 07/17/15  6:51 AM  Result Value Ref Range Status   MRSA by PCR POSITIVE (A) NEGATIVE Final    Comment:        The GeneXpert MRSA Assay (FDA approved for NASAL specimens only), is one component of a comprehensive MRSA colonization surveillance program. It is not intended to diagnose  MRSA infection nor to guide or monitor treatment for MRSA infections. CRITICAL RESULT CALLED TO, READ BACK BY AND VERIFIED WITH: Yehuda SavannahLauren Hobbs @ 07/16/16 by [facility]   Culture, sputum-assessment     Status: None   Collection Time: 07/17/15  5:44 PM  Result Value Ref Range Status   Specimen Description SPUTUM  Final   Special Requests NONE  Final   Sputum evaluation   Final    Sputum specimen not acceptable for testing.  Please recollect.   CALLED TO MARCELLA TURNER @ 2009 ON 07/17/2015 BY [facility]   Report Status 07/17/2015 FINAL  Final  Culture, blood (single)     Status: None   Collection Time: 07/18/15 10:02 AM  Result Value Ref Range Status   Specimen Description BLOOD LEFT HAND  Final   Special Requests BOTTLES DRAWN AEROBIC AND ANAEROBIC  2CC  Final   Culture NO GROWTH 5 DAYS  Final   Report Status 07/23/2015 FINAL  Final    Coagulation Studies: No results for input(s): LABPROT, INR in the last 72 hours.  Urinalysis: No results for input(s): COLORURINE, LABSPEC, PHURINE, GLUCOSEU, HGBUR, BILIRUBINUR, KETONESUR, PROTEINUR, UROBILINOGEN, NITRITE, LEUKOCYTESUR in the last 72 hours.  Invalid input(s): APPERANCEUR    Imaging: No results found.   Medications:     . amLODipine  10 mg Oral QPM  . antiseptic oral rinse  7 mL Mouth Rinse BID  . aspirin  81 mg Oral Daily  . carvedilol  12.5 mg Oral Q12H  . cloNIDine  0.1 mg Oral TID  . cyanocobalamin  500 mcg Oral Daily  . docusate  100 mg Oral QODAY  . fentaNYL  25 mcg Transdermal Q72H  . fluticasone  2 spray Each Nare Daily  . furosemide  40 mg Oral Daily  . gentamicin cream  1 application Topical Daily  . heparin  5,000 Units Subcutaneous Q12H  . hydrALAZINE  100 mg Oral TID  . latanoprost  1 drop Both Eyes QHS  . lidocaine  1 patch Transdermal Q24H  . loratadine  10 mg Oral Daily  . mirtazapine  15 mg Oral QHS  . multivitamin-lutein  1 capsule Oral BID  . phenytoin  300 mg Oral QHS  . predniSONE  10 mg Oral Daily  .  ascorbic acid  500 mg Oral Daily   acetaminophen, albuterol, hydrALAZINE, lactulose, morphine injection, nitroGLYCERIN, [DISCONTINUED] ondansetron **OR** ondansetron (ZOFRAN) IV  Assessment/ Plan:  81 y.o. female with past medical history  of hypertension, obstructive sleep apnea, DJD, CVA with residual left sided weakness, carotid stenosis status post L CEA 2/15, seizure disorder, diastolic congestive heart failure, anemia  [facility] PD [facility]  1. End Stage Renal Disease on PD: Patient's daughter had started PD training as outpatient but got only 2 out of 8 days.   emergent HD 12/18. 2500 cc removed. Breathing is better today Patient appears to continue to want aggressive care as she is aware that quitting dialysis would mean hospice and death in near future.  Plan: Dialysis tomorrow seated in the chair, then 1 daily Transition to PD as outpatient- Back up HD on Wed.  PD training Tuesday, Wed, Thursday Family working to set up [employer] transport   2. Hypertension: Allergy to enalapril (causes anaphylaxis). Blood pressures now at goal.  - home regimen of amlodipine, carvedilol, clonidine, furosemide, hydralazine.  - If blood pressure tends to decrease, would discontinue clonidine and decrease the dose of hydralazine   3. Secondary hyperparathyroidism. Phos and calcium low outpatient due to poor nutritional status. Not currently on binders.   4. Anemia chronic kidney disease:   - low dose epo with HD.   5.  Bacterial pneumonia/respiratory failure:  Suspect aspiration.  Abx continued  6. Hypokalemia - replaced iv /po  - 4 K dialysis  7. Palliative care team following  8. Acute pulm edema - now off of BiPAP - improved       LOS: 9 Odarius Dines 12/23/20162:41 PM

## 2015-07-26 NOTE — Care Management Note (Signed)
Patient [facility] on a Monday Wednesday schedule [facility].  I have spoken with the center and are prepared for her to return next Wednesday 10:30  Ivor ReiningKim Yosef Krogh Dialysis Liaison  [phone]

## 2015-07-26 NOTE — Progress Notes (Signed)
Nutrition Follow-up  INTERVENTION:   Coordination of Care: pt eating minimal po, sips/bites at most since admission. Pt receiving supplements (Mighty Shakes, [employer]), family also offering Butter Pecan Nepro. Catering to preferences within dietary restrictions. Pt being assisted/fed at meal times. PEG tube has been discussed with family, no plans for PEG placement at this time as family wants pt to resume PD at discharge. All of this was discussed with MD Nemiah CommanderKalisetti, MD plans to readdress nutrition with pt's family. Will sign off, please reconsult RD if warranted, no further recommendations at this time   NUTRITION DIAGNOSIS:   Inadequate oral intake related to inability to eat as evidenced by NPO status. Continues  GOAL:   Patient will meet greater than or equal to 90% of their needs  MONITOR:    (Energy Intake, Electrolyte and renal Profile, Anthropometrics)  REASON FOR ASSESSMENT:   Diagnosis, Malnutrition Screening Tool (Renal Diet)    ASSESSMENT:   Pt admitted with difficulty breathing with possible aspiration pna. Per H&P pt resided at [facility] for the past month and a half receiving HD. Pt [facility] with the intent of using PD at home. Pt on isolation currently for MRSA. SLP following.  Noted plan for discharge home on HD until family completes PD orientation and once BiPap is arranged in home. PO intake remains very poor  Diet Order:  DIET - DYS 1 Room service appropriate?: Yes with Assist; Fluid consistency:: Nectar Thick   Energy Intake: recorded po intake 0-10% of meals, poor intake since admission.  This AM pt took 3 spoonfuls of OJ and 2 sips of Mighty Shake this AM for breakfast then pt states "im done," pt does not want anything else to eat  Skin:   (Stage III coccyx pressure ulcer)  Last BM:  unknown  Electrolyte and Renal Profile:  Recent Labs Lab 07/24/15 0434 07/25/15 1326 07/25/15 1951 07/26/15 0716  BUN 14 19  --   10  CREATININE 2.08* 2.93*  --  1.96*  NA 137 133*  --  140  K 2.5* 2.7* 3.0* 3.3*  MG 1.8  --   --   --    Glucose Profile:  Recent Labs  07/26/15 0010 07/26/15 0348 07/26/15 0723  GLUCAP 111* 95 87   Meds: lasix, prednisone  Height:   Ht Readings from Last 1 Encounters:  07/18/15 5\' 6"  (1.676 m)    Weight:   Wt Readings from Last 1 Encounters:  06/06/15 193 lb 12.6 oz (87.9 kg)   Filed Weights   07/25/15 0500  Weight: 165 lb 2 oz (74.9 kg)    BMI:  Body mass index is 26.66 kg/(m^2).  Estimated Nutritional Needs:   Kcal:  BEE: 1027kcals, TEE: (IF 1.2-1.4)(AF 1.2) 1478-1725kcals, using IBW of 59kg  Protein:  70-89g protein (1.2-1.5g/kg) using IBW of 59kg  Fluid:  UOP+1L  EDUCATION NEEDS:   No education needs identified at this time  Signed off  Romelle StarcherCate Haset O[place]aca MS, RD, LDN [phone] Pager  [phone] Weekend/On-Call Pager

## 2015-07-26 NOTE — Progress Notes (Signed)
Called Dr Nemiah CommanderKalisetti re pt BP meds, asked if she wanted to give or hold (BP 128/46); Dr stated to hold 1600 meds; stated if systolic <100 or diastolic <60, hold 1800 meds as well

## 2015-07-26 NOTE — Progress Notes (Signed)
Pt continues to use BiPAP at HS and during the day PRN usually for naps.

## 2015-07-26 NOTE — Progress Notes (Signed)
No distress No new complaints Has not used PRN BiPAP in > 24 hrs  Filed Vitals:   07/26/15 1045 07/26/15 1100 07/26/15 1200 07/26/15 1209  BP: 142/62 123/101 94/29 90/60  Pulse: 85 84 69 74  Temp:    97.5 F (36.4 C)  TempSrc:    Axillary  Resp: 21 14 18 19   Height:      Weight:      SpO2: 99% 99% 90% 97%   NAD HEENT: NAD No JVD noted diminished BS anteriorly, no wheezes Reg, no M NABS Symmetric pedal edema  BMP Latest Ref Rng 07/26/2015 07/25/2015 07/25/2015  Glucose 65 - 99 mg/dL 98 - 161(W104(H)  BUN 6 - 20 mg/dL 10 - 19  Creatinine 9.600.44 - 1.00 mg/dL 4.54(U1.96(H) - 9.81(X2.93(H)  Sodium 135 - 145 mmol/L 140 - 133(L)  Potassium 3.5 - 5.1 mmol/L 3.3(L) 3.0(L) 2.7(LL)  Chloride 101 - 111 mmol/L 104 - 98(L)  CO2 22 - 32 mmol/L 31 - 28  Calcium 8.9 - 10.3 mg/dL 9.1(Y8.5(L) - 8.5(L)    CBC Latest Ref Rng 07/26/2015 07/25/2015 07/23/2015  WBC 3.6 - 11.0 K/uL 6.6 6.8 5.3  Hemoglobin 12.0 - 16.0 g/dL 10.4(L) 11.0(L) 10.1(L)  Hematocrit 35.0 - 47.0 % 32.2(L) 34.8(L) 31.5(L)  Platelets 150 - 440 K/uL 145(L) 161 169    No new CXR  IMPRESSION: Acute resp failure - suspect multifactorial. Has completed one week of abx for possible PNA. Last CXR revealed bibasilar atelectasis and mild edema pattern. Now improved  CKD on dialysis  Advanced age  DNR/DNI   PLAN/REC:  Discussed with Dr Nemiah CommanderKalisetti Transfer to regular floor Cont supplemental O2 to maintain SpO2 > 90% Cont nocturnal BiPAP  PCCM will sign off. Please call if we can be of further assistance  Billy Fischeravid Shreyan Hinz, MD PCCM service Mobile [phone] Pager [phone]

## 2015-07-26 NOTE — Care Management (Signed)
Spoke with Will from [employer].  Still have not received a "final approval" from the DME company.  Informed by Rosita FireKelly Kiehn that "someone has come to the house" to discuss the Trilogy.   Will with [employer] relayed that "they are going to bring the machine to the hospital on Monday."  Discussed with Will that the [facility] respiratory therapist must communicate with [facility] staff before bringing the equipment in the hospital.   Have left voicemail message for Enid DerryKaren Bartles and paged her to discuss.  Spoke with Jonny RuizJohn RT and explained that [facility] RT will bring machine Monday.  At this point it is assumed it is to instruct family how to apply and operate.  Whether it is to "be left" and used is not clear- but anticipate discharge on Monday- Tuesday at the latest.  Informed that patient does not have portable home 02 tanks.  Discussed with Tresa EndoKelly and patient does indeed have portable 02 tanks.  Also discussed patient limited nutritional intake.  It is hoped that patient's intake will increase once she gets home.   The discharge plan and time at present is as follows:  Family will call [facility] to arrange transportation to dialysis  HD Monday and discharge home- Family will be taught how to use the Trilogy  [facility]  (per Selena BattenKim- this was set up by Dr Thedore MinsSingh)  [facility] to resume services

## 2015-07-26 NOTE — Progress Notes (Signed)
Placed pt on Bipap for sleep 

## 2015-07-27 MED ORDER — HYDRALAZINE HCL 50 MG PO TABS
50.0000 mg | ORAL_TABLET | Freq: Three times a day (TID) | ORAL | Status: DC
  Administered 2015-07-27 – 2015-07-29 (×4): 50 mg via ORAL
  Filled 2015-07-27 (×6): qty 1

## 2015-07-27 NOTE — Progress Notes (Signed)
[facility]  ROUNDING NOTE   Subjective:   Patient is currently on NIPPV Did not answer any questions  Objective:  Vital signs in last 24 hours:  Temp:  [97.5 F (36.4 C)-98.6 F (37 C)] 98.2 F (36.8 C) (12/24 0551) Pulse Rate:  [65-85] 65 (12/24 0551) Resp:  [13-21] 16 (12/24 0551) BP: (90-144)/(29-101) 144/61 mmHg (12/24 0551) SpO2:  [90 %-100 %] 100 % (12/24 0551) FiO2 (%):  [30 %] 30 % (12/23 1209) Weight:  [72.258 kg (159 lb 4.8 oz)] 72.258 kg (159 lb 4.8 oz) (12/24 0500)  Weight change:  Filed Weights   07/25/15 0500 07/27/15 0500  Weight: 74.9 kg (165 lb 2 oz) 72.258 kg (159 lb 4.8 oz)    Intake/Output: I/O last 3 completed shifts: In: 90 [P.O.:40; IV Piggyback:50] Out: 0    Intake/Output this shift:     Physical Exam: General: Ill appearing  Head:  atraumatic. Moist oral mucosal membranes  Eyes: Anicteric  Neck: Supple, trachea midline  Lungs:   normal respiratory effort, clear anteriorly and laterally , NIPPV  Heart: S1S2 no rubs  Abdomen:  Soft, nontender, BS present   Extremities:  trace peripheral edema.  Neurologic: Arousable,   Skin: No acute lesions  Access: PD catheter in place, RIJ permcath    Basic Metabolic Panel:  Recent Labs Lab 07/22/15 0434  07/23/15 0527 07/24/15 0434 07/25/15 1326 07/25/15 1951 07/26/15 0716  NA 137  --  136 137 133*  --  140  K 2.9*  < > 3.0* 2.5* 2.7* 3.0* 3.3*  CL 99*  --  101 99* 98*  --  104  CO2 27  --  27 29 28   --  31  GLUCOSE 92  --  84 166* 104*  --  98  BUN 20  --  26* 14 19  --  10  CREATININE 2.42*  --  3.19* 2.08* 2.93*  --  1.96*  CALCIUM 7.9*  --  8.2* 8.2* 8.5*  --  8.5*  MG  --   --   --  1.8  --   --   --   < > = values in this interval not displayed.  Liver Function Tests: No results for input(s): AST, ALT, ALKPHOS, BILITOT, PROT, ALBUMIN in the last 168 hours. No results for input(s): LIPASE, AMYLASE in the last 168 hours. No results for input(s): AMMONIA in the last  168 hours.  CBC:  Recent Labs Lab 07/21/15 0526 07/23/15 0527 07/25/15 1326 07/26/15 0716  WBC 7.2 5.3 6.8 6.6  HGB 10.9* 10.1* 11.0* 10.4*  HCT 34.5* 31.5* 34.8* 32.2*  MCV 95.8 94.2 94.7 93.0  PLT 194 169 161 145*    Cardiac Enzymes: No results for input(s): CKTOTAL, CKMB, CKMBINDEX, TROPONINI in the last 168 hours.  BNP: Invalid input(s): POCBNP  CBG:  Recent Labs Lab 07/25/15 1952 07/26/15 0010 07/26/15 0348 07/26/15 0723 07/26/15 1154  GLUCAP 110* 111* 95 87 113*    Microbiology: Results for orders placed or performed during the hospital encounter of 07/17/15  Blood culture (routine x 2)     Status: None   Collection Time: 07/17/15  4:02 AM  Result Value Ref Range Status   Specimen Description BLOOD RIGHT HAND  Final   Special Requests BOTTLES DRAWN AEROBIC AND ANAEROBIC 5ML  Final   Culture NO GROWTH 5 DAYS  Final   Report Status 07/22/2015 FINAL  Final  Blood culture (routine x 2)     Status: None  Collection Time: 07/17/15  4:02 AM  Result Value Ref Range Status   Specimen Description BLOOD LEFT ANTECUBITAL  Final   Special Requests BOTTLES DRAWN AEROBIC AND ANAEROBIC  Final   Culture  Setup Time   Final    GRAM POSITIVE COCCI IN CLUSTERS AEROBIC BOTTLE ONLY CRITICAL RESULT CALLED TO, READ BACK BY AND VERIFIED WITH: MARCELLA TURNER AT 2253 07/17/2015 BY TFK CONFIRMED BY MLZ    Culture   Final    COAGULASE NEGATIVE STAPHYLOCOCCUS AEROBIC BOTTLE ONLY Results consistent with contamination.    Report Status 07/22/2015 FINAL  Final  MRSA PCR Screening     Status: Abnormal   Collection Time: 07/17/15  6:51 AM  Result Value Ref Range Status   MRSA by PCR POSITIVE (A) NEGATIVE Final    Comment:        The GeneXpert MRSA Assay (FDA approved for NASAL specimens only), is one component of a comprehensive MRSA colonization surveillance program. It is not intended to diagnose MRSA infection nor to guide or monitor treatment for MRSA  infections. CRITICAL RESULT CALLED TO, READ BACK BY AND VERIFIED WITH: Yehuda Savannah @ 07/16/16 by [facility]   Culture, sputum-assessment     Status: None   Collection Time: 07/17/15  5:44 PM  Result Value Ref Range Status   Specimen Description SPUTUM  Final   Special Requests NONE  Final   Sputum evaluation   Final    Sputum specimen not acceptable for testing.  Please recollect.   CALLED TO MARCELLA TURNER @ 2009 ON 07/17/2015 BY CAF   Report Status 07/17/2015 FINAL  Final  Culture, blood (single)     Status: None   Collection Time: 07/18/15 10:02 AM  Result Value Ref Range Status   Specimen Description BLOOD LEFT HAND  Final   Special Requests BOTTLES DRAWN AEROBIC AND ANAEROBIC  2CC  Final   Culture NO GROWTH 5 DAYS  Final   Report Status 07/23/2015 FINAL  Final    Coagulation Studies: No results for input(s): LABPROT, INR in the last 72 hours.  Urinalysis: No results for input(s): COLORURINE, LABSPEC, PHURINE, GLUCOSEU, HGBUR, BILIRUBINUR, KETONESUR, PROTEINUR, UROBILINOGEN, NITRITE, LEUKOCYTESUR in the last 72 hours.  Invalid input(s): APPERANCEUR    Imaging: Dg Chest Port 1 View  07/26/2015  CLINICAL DATA:  Acute respiratory failure, aspiration pneumonia, chronic renal insufficiency. EXAM: PORTABLE CHEST 1 VIEW COMPARISON:  Portable chest x-ray of July 23, 2015 FINDINGS: The lungs are mildly hypoinflated. Confluent alveolar infiltrate persists at the left lung base with increased interstitial density in the left perihilar region. Coarse right perihilar and infrahilar lung markings are slightly more conspicuous today. The cardiac silhouette remains enlarged. There is dense calcification of the mitral valvular annulus. The pulmonary vascularity more prominent today. The dual-lumen dialysis type catheter tip projects over the midportion of the SVC. IMPRESSION: Interval increase in bibasilar pneumonia. Perihilar interstitial infiltrate or atelectasis is more conspicuous as well.  Mild CHF more conspicuous than on the previous study. Electronically Signed   By: David  [place]az[place] M.D.   On: 07/26/2015 16:11     Medications:     . amLODipine  10 mg Oral QPM  . antiseptic oral rinse  7 mL Mouth Rinse BID  . aspirin  81 mg Oral Daily  . carvedilol  12.5 mg Oral Q12H  . cloNIDine  0.1 mg Oral TID  . cyanocobalamin  500 mcg Oral Daily  . docusate  100 mg Oral QODAY  . fentaNYL  25 mcg  Transdermal Q72H  . fluticasone  2 spray Each Nare Daily  . furosemide  40 mg Oral Daily  . gentamicin cream  1 application Topical Daily  . heparin  5,000 Units Subcutaneous Q12H  . hydrALAZINE  100 mg Oral TID  . latanoprost  1 drop Both Eyes QHS  . lidocaine  1 patch Transdermal Q24H  . loratadine  10 mg Oral Daily  . mirtazapine  15 mg Oral QHS  . multivitamin-lutein  1 capsule Oral BID  . phenytoin  300 mg Oral QHS  . predniSONE  10 mg Oral Daily  . ascorbic acid  500 mg Oral Daily   acetaminophen, albuterol, hydrALAZINE, lactulose, morphine injection, nitroGLYCERIN, [DISCONTINUED] ondansetron **OR** ondansetron (ZOFRAN) IV  Assessment/ Plan:  81 y.o. female with past medical history of hypertension, obstructive sleep apnea, DJD, CVA with residual left sided weakness, carotid stenosis status post L CEA 2/15, seizure disorder, diastolic congestive heart failure, anemia  [facility] PD [facility]  1. End Stage Renal Disease on PD: Patient's daughter had started PD training as outpatient but got only 2 out of 8 days.   emergent HD 12/18. 2500 cc removed. Breathing is better today Patient appears to continue to want aggressive care as she is aware that quitting dialysis would mean hospice and death in near future.  Plan: Dialysis today seated in the chair, then Monday  Transition to PD as outpatient- Back up HD on Wed.  PD training for daughter on Tuesday, Thursday, Friday Family working to set up [employer] transport   2. Hypertension: Allergy to enalapril (causes anaphylaxis). Blood  pressures now at goal.  - home regimen of amlodipine, carvedilol, clonidine, furosemide, hydralazine.  - If blood pressure tends to decrease, would discontinue clonidine and decrease the dose of hydralazine   3. Secondary hyperparathyroidism. Phos and calcium low outpatient due to poor nutritional status. Not currently on binders.   4. Anemia chronic kidney disease:   - low dose epo with HD.   5.  Bacterial pneumonia/respiratory failure:  Suspect aspiration.  Abx continued  6. Hypokalemia - replaced iv /po  - 4 K dialysis  7. Palliative care team following  8. Acute pulm edema - requiring BiPAP off and on        LOS: 10 Teal Bontrager 12/24/201610:15 AM

## 2015-07-27 NOTE — Progress Notes (Signed)
[facility] [place] [facility]   PATIENT NAME: Kristina Wilson    MR#:  [id number]  DATE OF BIRTH:  1922-11-26  SUBJECTIVE:  CHIEF COMPLAINT:   Chief Complaint  Patient presents with  . Shortness of Breath   - Seen on BiPAP this morning. Sleeping but arousable and following commands. -For dialysis again today per schedule. -Poor by mouth intake.   REVIEW OF SYSTEMS:  Review of Systems  Constitutional: Negative for fever and chills.  HENT: Negative for ear pain and tinnitus.        Increased oral secretions  Eyes: Negative for blurred vision.  Respiratory: Negative for cough, shortness of breath and wheezing.   Cardiovascular: Negative for chest pain, palpitations and leg swelling.  Gastrointestinal: Negative for nausea, vomiting, abdominal pain, diarrhea and constipation.  Genitourinary: Negative for dysuria.  Musculoskeletal: Negative for myalgias.  Neurological: Positive for weakness. Negative for dizziness, speech change, focal weakness, seizures and headaches.  Psychiatric/Behavioral: Negative for depression.    DRUG ALLERGIES:   Allergies  Allergen Reactions  . Enalapril Anaphylaxis  . Tramadol Shortness Of Breath  . Capsaicin Other (See Comments)    Reaction:  Unknown   . Codeine Other (See Comments)    Reaction:  Unknown   . Hydrocodone Other (See Comments)    Reaction:  Unknown   . Vicodin [Hydrocodone-Acetaminophen] Other (See Comments)    Reaction:  Unknown     VITALS:  Blood pressure 144/61, pulse 65, temperature 98.2 F (36.8 C), temperature source Oral, resp. rate 16, height  (1.676 m), weight 72.258 kg (159 lb 4.8 oz), SpO2 100 %.  PHYSICAL EXAMINATION:  Physical Exam  GENERAL:  81 y.o.-year-old patient lying in the bed with no acute distress.  EYES: Pupils equal, round, reactive to light and accommodation. No scleral icterus. Extraocular muscles intact.  HEENT: Head atraumatic, normocephalic. Oropharynx and  nasopharynx clear. Increased oral secretions NECK:  Supple, no jugular venous distention. No thyroid enlargement, no tenderness.  LUNGS: Normal breath sounds bilaterally, decreased bibasilar breath sounds, rhonchi heard diffusely,  no wheezing. No use of accessory muscles of respiration.  CARDIOVASCULAR: S1, S2 normal. No rubs, or gallops. 3/6 systolic murmur present ABDOMEN: Soft, nontender, nondistended. Bowel sounds present. No organomegaly or mass.  EXTREMITIES: No cyanosis, or clubbing. 1+ pedal edema present Complains of tenderness when her legs are being examined NEUROLOGIC: patient is alert, on BiPAP, following commands. Cranial nerves II through XII are intact. No focal deficits noted. Sensation intact. Gait not checked.  PSYCHIATRIC: The patient is more alert, appears weak SKIN: No obvious rash, lesion, or ulcer.    LABORATORY PANEL:   CBC  Recent Labs Lab 07/26/15 0716  WBC 6.6  HGB 10.4*  HCT 32.2*  PLT 145*   ------------------------------------------------------------------------------------------------------------------  Chemistries   Recent Labs Lab 07/24/15 0434  07/26/15 0716  NA 137  < > 140  K 2.5*  < > 3.3*  CL 99*  < > 104  CO2 29  < > 31  GLUCOSE 166*  < > 98  BUN 14  < > 10  CREATININE 2.08*  < > 1.96*  CALCIUM 8.2*  < > 8.5*  MG 1.8  --   --   < > = values in this interval not displayed. ------------------------------------------------------------------------------------------------------------------  Cardiac Enzymes No results for input(s): TROPONINI in the last 168 hours. ------------------------------------------------------------------------------------------------------------------  RADIOLOGY:  Dg Chest Port 1 View  07/26/2015  CLINICAL DATA:  Acute respiratory failure, aspiration pneumonia, chronic renal  insufficiency. EXAM: PORTABLE CHEST 1 VIEW COMPARISON:  Portable chest x-ray of July 23, 2015 FINDINGS: The lungs are mildly  hypoinflated. Confluent alveolar infiltrate persists at the left lung base with increased interstitial density in the left perihilar region. Coarse right perihilar and infrahilar lung markings are slightly more conspicuous today. The cardiac silhouette remains enlarged. There is dense calcification of the mitral valvular annulus. The pulmonary vascularity more prominent today. The dual-lumen dialysis type catheter tip projects over the midportion of the SVC. IMPRESSION: Interval increase in bibasilar pneumonia. Perihilar interstitial infiltrate or atelectasis is more conspicuous as well. Mild CHF more conspicuous than on the previous study. Electronically Signed   By: David  [place] M.D.   On: 07/26/2015 16:11    EKG:   Orders placed or performed during the hospital encounter of 07/17/15  . ED EKG  . ED EKG  . EKG 12-Lead  . EKG 12-Lead    ASSESSMENT AND PLAN:   Principal Problem:  Aspiration pneumonia (HCC) Active Problems:  CKD (chronic kidney disease), stage IV (HCC)  Pressure ulcer  Chronic respiratory failure (HCC)  HTN (hypertension), benign  * aspiration Vs HCAP pneumonia- causing acute on chronic respiratory distress.  Associated sepsis - present on admission- with altered mental status. vanc discontinued as blood cultures negative, on zosyn- stop after 10 days of ABX- after 07/26/15 dose  Swallow evaluation with dysphagia diet, nectar thick\  appreciate pulm consult - f/u CXR -with persistent infiltrate, likely from aspiration pneumonia. Also has some pulmonary vascular congestion and small pleural effusions.  * Chronic respiratory failure  On oxygen, continue. Now weaned to 3L nasal cannula - CXR with pulm vascular congestion- Improved breathing with dialysis - patient also has chronic respiratory failure due to underlying COPD with frequent hypercarbia -BiPAP device has been considered and has been ruled out as an effective treatment due to inability to tolerate  and frequent need for it. -Patient will need noninvasive positive pressure ventilation due to severity of her disease state and life-threatening condition including CO2 retention. -Patient will need nocturnal usage of such ventilation and also during the day as needed due to increased probability of acute exacerbations. And without such ventilatory support serious, or death could occur. - care manager trying to arrange that.  * Dysphagia- poor PO intake continues. Daughter aware - constant secretions and has a suction catheter beside her at all times - poor intake. -had MBSS, on nectar thick liquids and dysphagia diet  * ESRD  on PD- nephro consult. Hemodialysis again today, sitting in the chair for dialysis  - she will need 2 weeks of HD prior to transitioning to PD while family gets the PD orientation -Has peritoneal dialysis catheter and family want to continue to do PD as outpatient Also has a right chest permacath for HD in hospital - appreciate nephrology consult,  -Patient and family want to continue dialysis at this point  * Htn  On Coreg, clonidine and Lasix Also on oral hydralazine.  * CVA Hx  Bedbound- is total care.  Family do not want any rehabilitation. They want to take her home.  * Hx of seizure disorder  On dilantin-  liquid form  * DVT Prophylaxis- SQ Heparin   Cont to monitor, physical therapy consult when able to Overall poor prognosis. Appreciate palliative care input   PLAN is to discharge home with daughter and niece caring for her- once Bipap is arranged at home. Also once outpatient hemodialysis is set up. Likely next week  All the records are reviewed and case discussed with Care Management/Social Workerr. Management plans discussed with the patient, family and they are in agreement.  CODE STATUS: DNR  TOTAL TIME TAKING CARE OF THIS PATIENT: 37 minutes.     Enid BaasKALISETTI,Jamesia Linnen M.D on 07/27/2015 at 12:13 PM  Between 7am to 6pm - Pager  - [phone]  After 6pm go to [email or web address] - password [email or web address] [facility]  [facility]  Office  [phone]  CC: Primary care physician; Jaclyn ShaggyATE,DENNY C, MD

## 2015-07-28 LAB — RENAL FUNCTION PANEL
ALBUMIN: 1.7 g/dL — AB (ref 3.5–5.0)
ANION GAP: 6 (ref 5–15)
BUN: 20 mg/dL (ref 6–20)
CO2: 32 mmol/L (ref 22–32)
Calcium: 8.9 mg/dL (ref 8.9–10.3)
Chloride: 104 mmol/L (ref 101–111)
Creatinine, Ser: 3.39 mg/dL — ABNORMAL HIGH (ref 0.44–1.00)
GFR, EST AFRICAN AMERICAN: 13 mL/min — AB (ref >60)
GFR, EST NON AFRICAN AMERICAN: 11 mL/min — AB (ref >60)
Glucose, Bld: 117 mg/dL — ABNORMAL HIGH (ref 65–99)
PHOSPHORUS: 3.7 mg/dL (ref 2.5–4.6)
POTASSIUM: 3.3 mmol/L — AB (ref 3.5–5.1)
Sodium: 142 mmol/L (ref 135–145)

## 2015-07-28 LAB — CBC
HEMATOCRIT: 31.6 % — AB (ref 35.0–47.0)
HEMOGLOBIN: 10.1 g/dL — AB (ref 12.0–16.0)
MCH: 29.7 pg (ref 26.0–34.0)
MCHC: 32 g/dL (ref 32.0–36.0)
MCV: 92.9 fL (ref 80.0–100.0)
Platelets: 154 10*3/uL (ref 150–440)
RBC: 3.4 MIL/uL — ABNORMAL LOW (ref 3.80–5.20)
RDW: 18 % — AB (ref 11.5–14.5)
WBC: 7.4 10*3/uL (ref 3.6–11.0)

## 2015-07-28 NOTE — Progress Notes (Signed)
[facility]  ROUNDING NOTE   Subjective:   Patient is currently on NIPPV Able to answer simple questions Denies acute complaints  Objective:  Vital signs in last 24 hours:  Temp:  [98.1 F (36.7 C)-98.8 F (37.1 C)] 98.1 F (36.7 C) (12/25 0437) Pulse Rate:  [81-92] 82 (12/25 0437) Resp:  [16-18] 18 (12/25 0437) BP: (133-145)/(60-91) 140/82 mmHg (12/25 0437) SpO2:  [96 %-98 %] 98 % (12/25 0437) Weight:  [71.215 kg (157 lb)] 71.215 kg (157 lb) (12/25 0500)  Weight change: -1.043 kg (-2 lb 4.8 oz) Filed Weights   07/27/15 0500 07/28/15 0500  Weight: 72.258 kg (159 lb 4.8 oz) 71.215 kg (157 lb)    Intake/Output: I/O last 3 completed shifts: In: 100 [P.O.:100] Out: 0    Intake/Output this shift:     Physical Exam: General: Ill appearing  Head:  atraumatic. Moist oral mucosal membranes  Eyes: Anicteric  Neck: Supple, trachea midline  Lungs:   normal respiratory effort, clear anteriorly and laterally , NIPPV, some basilar crackles bilaterally   Heart: S1S2 no rubs  Abdomen:  Soft, nontender, BS present   Extremities:  trace peripheral edema.  Neurologic: Arousable,   Skin: No acute lesions  Access: PD catheter in place, RIJ permcath    Basic Metabolic Panel:  Recent Labs Lab 07/22/15 0434  07/23/15 0527 07/24/15 0434 07/25/15 1326 07/25/15 1951 07/26/15 0716  NA 137  --  136 137 133*  --  140  K 2.9*  < > 3.0* 2.5* 2.7* 3.0* 3.3*  CL 99*  --  101 99* 98*  --  104  CO2 27  --  --  31  GLUCOSE 92  --  84 166* 104*  --  98  BUN 20  --  26* 14 19  --  10  CREATININE 2.42*  --  3.19* 2.08* 2.93*  --  1.96*  CALCIUM 7.9*  --  8.2* 8.2* 8.5*  --  8.5*  MG  --   --   --  1.8  --   --   --   < > = values in this interval not displayed.  Liver Function Tests: No results for input(s): AST, ALT, ALKPHOS, BILITOT, PROT, ALBUMIN in the last 168 hours. No results for input(s): LIPASE, AMYLASE in the last 168 hours. No results for input(s):  AMMONIA in the last 168 hours.  CBC:  Recent Labs Lab 07/23/15 0527 07/25/15 1326 07/26/15 0716  WBC 5.3 6.8 6.6  HGB 10.1* 11.0* 10.4*  HCT 31.5* 34.8* 32.2*  MCV 94.2 94.7 93.0  PLT 169 161 145*    Cardiac Enzymes: No results for input(s): CKTOTAL, CKMB, CKMBINDEX, TROPONINI in the last 168 hours.  BNP: Invalid input(s): POCBNP  CBG:  Recent Labs Lab 07/25/15 1952 07/26/15 0010 07/26/15 0348 07/26/15 0723 07/26/15 1154  GLUCAP 110* 111* 95 87 113*    Microbiology: Results for orders placed or performed during the hospital encounter of 07/17/15  Blood culture (routine x 2)     Status: None   Collection Time: 07/17/15  4:02 AM  Result Value Ref Range Status   Specimen Description BLOOD RIGHT HAND  Final   Special Requests BOTTLES DRAWN AEROBIC AND ANAEROBIC  Final   Culture NO GROWTH 5 DAYS  Final   Report Status 07/22/2015 FINAL  Final  Blood culture (routine x 2)     Status: None   Collection Time: 07/17/15  4:02 AM  Result Value  Ref Range Status   Specimen Description BLOOD LEFT ANTECUBITAL  Final   Special Requests BOTTLES DRAWN AEROBIC AND ANAEROBIC 15ML  Final   Culture  Setup Time   Final    GRAM POSITIVE COCCI IN CLUSTERS AEROBIC BOTTLE ONLY CRITICAL RESULT CALLED TO, READ BACK BY AND VERIFIED WITH: MARCELLA TURNER AT 2253 07/17/2015 BY TFK CONFIRMED BY MLZ    Culture   Final    COAGULASE NEGATIVE STAPHYLOCOCCUS AEROBIC BOTTLE ONLY Results consistent with contamination.    Report Status 07/22/2015 FINAL  Final  MRSA PCR Screening     Status: Abnormal   Collection Time: 07/17/15  6:51 AM  Result Value Ref Range Status   MRSA by PCR POSITIVE (A) NEGATIVE Final    Comment:        The GeneXpert MRSA Assay (FDA approved for NASAL specimens only), is one component of a comprehensive MRSA colonization surveillance program. It is not intended to diagnose MRSA infection nor to guide or monitor treatment for MRSA infections. CRITICAL RESULT  CALLED TO, READ BACK BY AND VERIFIED WITH: Yehuda SavannahLauren Hobbs @ 07/16/16 by [facility]   Culture, sputum-assessment     Status: None   Collection Time: 07/17/15  5:44 PM  Result Value Ref Range Status   Specimen Description SPUTUM  Final   Special Requests NONE  Final   Sputum evaluation   Final    Sputum specimen not acceptable for testing.  Please recollect.   CALLED TO MARCELLA TURNER @ 2009 ON 07/17/2015 BY [facility]   Report Status 07/17/2015 FINAL  Final  Culture, blood (single)     Status: None   Collection Time: 07/18/15 10:02 AM  Result Value Ref Range Status   Specimen Description BLOOD LEFT HAND  Final   Special Requests BOTTLES DRAWN AEROBIC AND ANAEROBIC  2CC  Final   Culture NO GROWTH 5 DAYS  Final   Report Status 07/23/2015 FINAL  Final    Coagulation Studies: No results for input(s): LABPROT, INR in the last 72 hours.  Urinalysis: No results for input(s): COLORURINE, LABSPEC, PHURINE, GLUCOSEU, HGBUR, BILIRUBINUR, KETONESUR, PROTEINUR, UROBILINOGEN, NITRITE, LEUKOCYTESUR in the last 72 hours.  Invalid input(s): APPERANCEUR    Imaging: Dg Chest Port 1 View  07/26/2015  CLINICAL DATA:  Acute respiratory failure, aspiration pneumonia, chronic renal insufficiency. EXAM: PORTABLE CHEST 1 VIEW COMPARISON:  Portable chest x-ray of July 23, 2015 FINDINGS: The lungs are mildly hypoinflated. Confluent alveolar infiltrate persists at the left lung base with increased interstitial density in the left perihilar region. Coarse right perihilar and infrahilar lung markings are slightly more conspicuous today. The cardiac silhouette remains enlarged. There is dense calcification of the mitral valvular annulus. The pulmonary vascularity more prominent today. The dual-lumen dialysis type catheter tip projects over the midportion of the SVC. IMPRESSION: Interval increase in bibasilar pneumonia. Perihilar interstitial infiltrate or atelectasis is more conspicuous as well. Mild CHF more conspicuous than  on the previous study. Electronically Signed   By: David  SwazilandJordan M.D.   On: 07/26/2015 16:11     Medications:     . amLODipine  10 mg Oral QPM  . antiseptic oral rinse  7 mL Mouth Rinse BID  . aspirin  81 mg Oral Daily  . carvedilol  12.5 mg Oral Q12H  . cloNIDine  0.1 mg Oral TID  . cyanocobalamin  500 mcg Oral Daily  . docusate  100 mg Oral QODAY  . fentaNYL  25 mcg Transdermal Q72H  . fluticasone  2 spray Each  Nare Daily  . furosemide  40 mg Oral Daily  . gentamicin cream  1 application Topical Daily  . heparin  5,000 Units Subcutaneous Q12H  . hydrALAZINE  50 mg Oral TID  . latanoprost  1 drop Both Eyes QHS  . lidocaine  1 patch Transdermal Q24H  . loratadine  10 mg Oral Daily  . mirtazapine  15 mg Oral QHS  . multivitamin-lutein  1 capsule Oral BID  . phenytoin  300 mg Oral QHS  . predniSONE  10 mg Oral Daily  . ascorbic acid  500 mg Oral Daily   acetaminophen, albuterol, hydrALAZINE, lactulose, morphine injection, nitroGLYCERIN, [DISCONTINUED] ondansetron **OR** ondansetron (ZOFRAN) IV  Assessment/ Plan:  81 y.o. female with past medical history of hypertension, obstructive sleep apnea, DJD, CVA with residual left sided weakness, carotid stenosis status post L CEA 2/15, seizure disorder, diastolic congestive heart failure, anemia  [facility] PD [facility]  1. End Stage Renal Disease on PD: Patient's daughter had started PD training as outpatient but got only 2 out of 8 days.   emergent HD 12/18. 2500 cc removed.  Patient appears to continue to want aggressive care as she is aware that quitting dialysis would mean hospice and death in near future.  Plan: Dialysis today seated in the chair. Her treatment got delayed yesterday due to another dialysis emergency Transition to PD as outpatient- Back up HD on Wed.  PD training for daughter on Tuesday, Thursday, Friday Family working to set up [employer] transport   2. Hypertension: Allergy to enalapril (causes anaphylaxis). Blood  pressures now at goal.  - home regimen of amlodipine, carvedilol, clonidine, furosemide, hydralazine.  - If blood pressure tends to decrease, would discontinue clonidine and decrease the dose of hydralazine  - Good blood pressure range systolic 140-160   3. Secondary hyperparathyroidism. Phos and calcium low outpatient due to poor nutritional status. Not currently on binders.   4. Anemia chronic kidney disease:   - low dose epo with HD.   5.  Bacterial pneumonia/respiratory failure:  Suspect aspiration.  Abx continued  6. Hypokalemia - replaced iv /po  - 4 K dialysate  7. Palliative care team following  8. Acute pulm edema - requiring BiPAP off and on        LOS: 11 Sonji Starkes 12/25/201611:29 AM

## 2015-07-28 NOTE — Progress Notes (Signed)
[facility] [place] [facility]   PATIENT NAME: Kristina GoltzJacquelyn Wilson    MR#:  [id number]  DATE OF BIRTH:  12-Nov-1922  SUBJECTIVE:  CHIEF COMPLAINT:   Chief Complaint  Patient presents with  . Shortness of Breath   - more alert, on Bipap this morning - possible dialysis today as couldn't be done yesterday   REVIEW OF SYSTEMS:  Review of Systems  Constitutional: Negative for fever and chills.       Decreased PO intake  HENT: Negative for ear pain and tinnitus.        Increased oral secretions  Eyes: Negative for blurred vision.  Respiratory: Negative for cough, shortness of breath and wheezing.   Cardiovascular: Negative for chest pain, palpitations and leg swelling.  Gastrointestinal: Negative for nausea, vomiting, abdominal pain, diarrhea and constipation.  Genitourinary: Negative for dysuria.  Musculoskeletal: Negative for myalgias.  Neurological: Positive for weakness. Negative for dizziness, speech change, focal weakness, seizures and headaches.  Psychiatric/Behavioral: Negative for depression.    DRUG ALLERGIES:   Allergies  Allergen Reactions  . Enalapril Anaphylaxis  . Tramadol Shortness Of Breath  . Capsaicin Other (See Comments)    Reaction:  Unknown   . Codeine Other (See Comments)    Reaction:  Unknown   . Hydrocodone Other (See Comments)    Reaction:  Unknown   . Vicodin [Hydrocodone-Acetaminophen] Other (See Comments)    Reaction:  Unknown     VITALS:  Blood pressure 140/82, pulse 82, temperature 98.1 F (36.7 C), temperature source Oral, resp. rate 18, height 5\' 6"  (1.676 m), weight 71.215 kg (157 lb), SpO2 98 %.  PHYSICAL EXAMINATION:  Physical Exam  GENERAL:  81 y.o.-year-old patient lying in the bed with no acute distress.  EYES: Pupils equal, round, reactive to light and accommodation. No scleral icterus. Extraocular muscles intact.  HEENT: Head atraumatic, normocephalic. Oropharynx and nasopharynx clear. Increased  oral secretions NECK:  Supple, no jugular venous distention. No thyroid enlargement, no tenderness.  LUNGS: Normal breath sounds bilaterally, decreased bibasilar breath sounds, rhonchi heard diffusely,  no wheezing. No use of accessory muscles of respiration.  CARDIOVASCULAR: S1, S2 normal. No rubs, or gallops. 3/6 systolic murmur present ABDOMEN: Soft, nontender, nondistended. Bowel sounds present. No organomegaly or mass.  EXTREMITIES: No cyanosis, or clubbing. 1+ pedal edema present Complains of tenderness when her legs are being examined NEUROLOGIC: patient is alert, on BiPAP, following commands. Cranial nerves II through XII are intact. No focal deficits noted. Sensation intact. Gait not checked.  PSYCHIATRIC: The patient is more alert, appears weak SKIN: No obvious rash, lesion, or ulcer.    LABORATORY PANEL:   CBC  Recent Labs Lab 07/26/15 0716  WBC 6.6  HGB 10.4*  HCT 32.2*  PLT 145*   ------------------------------------------------------------------------------------------------------------------  Chemistries   Recent Labs Lab 07/24/15 0434  07/26/15 0716  NA 137  < > 140  K 2.5*  < > 3.3*  CL 99*  < > 104  CO2 29  < > 31  GLUCOSE 166*  < > 98  BUN 14  < > 10  CREATININE 2.08*  < > 1.96*  CALCIUM 8.2*  < > 8.5*  MG 1.8  --   --   < > = values in this interval not displayed. ------------------------------------------------------------------------------------------------------------------  Cardiac Enzymes No results for input(s): TROPONINI in the last 168 hours. ------------------------------------------------------------------------------------------------------------------  RADIOLOGY:  Dg Chest Port 1 View  07/26/2015  CLINICAL DATA:  Acute respiratory failure, aspiration pneumonia, chronic  renal insufficiency. EXAM: PORTABLE CHEST 1 VIEW COMPARISON:  Portable chest x-ray of July 23, 2015 FINDINGS: The lungs are mildly hypoinflated. Confluent alveolar  infiltrate persists at the left lung base with increased interstitial density in the left perihilar region. Coarse right perihilar and infrahilar lung markings are slightly more conspicuous today. The cardiac silhouette remains enlarged. There is dense calcification of the mitral valvular annulus. The pulmonary vascularity more prominent today. The dual-lumen dialysis type catheter tip projects over the midportion of the SVC. IMPRESSION: Interval increase in bibasilar pneumonia. Perihilar interstitial infiltrate or atelectasis is more conspicuous as well. Mild CHF more conspicuous than on the previous study. Electronically Signed   By: David  [place] M.D.   On: 07/26/2015 16:11    EKG:   Orders placed or performed during the hospital encounter of 07/17/15  . ED EKG  . ED EKG  . EKG 12-Lead  . EKG 12-Lead    ASSESSMENT AND PLAN:   Principal Problem:  Aspiration pneumonia (HCC) Active Problems:  CKD (chronic kidney disease), stage IV (HCC)  Pressure ulcer  Chronic respiratory failure (HCC)  HTN (hypertension), benign  * aspiration Vs HCAP pneumonia- causing acute on chronic respiratory distress.  Associated sepsis - present on admission- with altered mental status. vanc discontinued as blood cultures negative, on zosyn- stop after 10 days of ABX- after 07/26/15 dose  Swallow evaluation with dysphagia diet, nectar thick\  appreciate pulm consult - f/u CXR -with persistent infiltrate, likely from aspiration pneumonia. Also has some pulmonary vascular congestion and small pleural effusions.  * Chronic respiratory failure  On oxygen, continue. Now weaned to 3L nasal cannula - CXR with pulm vascular congestion- Improved breathing with dialysis - patient also has chronic respiratory failure due to underlying COPD with frequent hypercarbia -BiPAP device has been considered and has been ruled out as an effective treatment due to inability to tolerate and frequent need for it. -Patient  will need noninvasive positive pressure ventilation due to severity of her disease state and life-threatening condition including CO2 retention. -Patient will need nocturnal usage of such ventilation and also during the day as needed due to increased probability of acute exacerbations. And without such ventilatory support serious, or death could occur. - care manager trying to arrange that.  * Dysphagia- poor PO intake continues. Daughter aware - constant secretions and has a suction catheter beside her at all times - poor intake. -had MBSS, on nectar thick liquids and dysphagia diet  * ESRD  on PD- nephro consult. Hemodialysis today, sitting in the chair for dialysis  - she will need 2 weeks of HD prior to transitioning to PD while family gets the PD orientation -Has peritoneal dialysis catheter and family want to continue to do PD as outpatient Also has a right chest permacath for HD in hospital - appreciate nephrology consult,  -Patient and family want to continue dialysis at this point  * Htn  On Coreg, clonidine and Lasix Also on oral hydralazine.  * CVA Hx  Bedbound- is total care.  Family do not want any rehabilitation. They want to take her home.  * Hx of seizure disorder  On dilantin-  liquid form  * DVT Prophylaxis- SQ Heparin   Cont to monitor, physical therapy consult when able to Overall poor prognosis. Appreciate palliative care input   PLAN is to discharge home with daughter and niece caring for her- once Bipap is arranged at home. Also once outpatient hemodialysis is set up. Likely next week  All the records are reviewed and case discussed with Care Management/Social Workerr. Management plans discussed with the patient, family and they are in agreement.  CODE STATUS: DNR  TOTAL TIME TAKING CARE OF THIS PATIENT: 37 minutes.     Enid Baas M.D on 07/28/2015 at 11:24 AM  Between 7am to 6pm - Pager - [phone]  After 6pm go to  [email or web address] - password [email or web address] [facility]  [facility]  Office  [phone]  CC: Primary care physician; Jaclyn Shaggy, MD

## 2015-07-28 NOTE — Progress Notes (Signed)
Pt placed on Bipap for sleep 

## 2015-07-28 NOTE — Plan of Care (Signed)
Problem: Education: Goal: Knowledge of [facility] Education information/materials will improve Outcome: Not Progressing Pt confused  Problem: Health Behavior/Discharge Planning: Goal: Ability to manage health-related needs will improve Outcome: Not Progressing Pt unable to manage self care  Problem: Nutrition: Goal: Adequate nutrition will be maintained Outcome: Not Progressing Poor pt intake, appetite; swallowing difficulties

## 2015-07-29 LAB — CBC
HCT: 31.8 % — ABNORMAL LOW (ref 35.0–47.0)
Hemoglobin: 10.3 g/dL — ABNORMAL LOW (ref 12.0–16.0)
MCH: 30 pg (ref 26.0–34.0)
MCHC: 32.4 g/dL (ref 32.0–36.0)
MCV: 92.7 fL (ref 80.0–100.0)
PLATELETS: 146 10*3/uL — AB (ref 150–440)
RBC: 3.43 MIL/uL — ABNORMAL LOW (ref 3.80–5.20)
RDW: 17.8 % — AB (ref 11.5–14.5)
WBC: 8.6 10*3/uL (ref 3.6–11.0)

## 2015-07-29 MED ORDER — COLLAGENASE 250 UNIT/GM EX OINT
TOPICAL_OINTMENT | Freq: Every day | CUTANEOUS | Status: DC
  Administered 2015-07-29: 12:00:00 via TOPICAL
  Filled 2015-07-29: qty 30

## 2015-07-29 NOTE — Progress Notes (Signed)
[facility] - [place] at [facility]   PATIENT NAME: Kristina Wilson    MR#:  [id number]  DATE OF BIRTH:  [birth year]  SUBJECTIVE:  CHIEF COMPLAINT:   Chief Complaint  Patient presents with  . Shortness of Breath   - Remains alert. Tolerated dialysis well yesterday. -Complains of nausea this morning   REVIEW OF SYSTEMS:  Review of Systems  Constitutional: Negative for fever and chills.       Decreased PO intake  HENT: Negative for ear pain and tinnitus.        Increased oral secretions  Eyes: Negative for blurred vision.  Respiratory: Negative for cough, shortness of breath and wheezing.   Cardiovascular: Negative for chest pain, palpitations and leg swelling.  Gastrointestinal: Positive for nausea. Negative for vomiting, abdominal pain, diarrhea and constipation.  Genitourinary: Negative for dysuria.  Musculoskeletal: Negative for myalgias.  Neurological: Positive for weakness. Negative for dizziness, speech change, focal weakness, seizures and headaches.  Psychiatric/Behavioral: Negative for depression.    DRUG ALLERGIES:   Allergies  Allergen Reactions  . Enalapril Anaphylaxis  . Tramadol Shortness Of Breath  . Capsaicin Other (See Comments)    Reaction:  Unknown   . Codeine Other (See Comments)    Reaction:  Unknown   . Hydrocodone Other (See Comments)    Reaction:  Unknown   . Vicodin [Hydrocodone-Acetaminophen] Other (See Comments)    Reaction:  Unknown     VITALS:  Blood pressure 146/55, pulse 80, temperature 98.6 F (37 C), temperature source Oral, resp. rate 18, height  (1.676 m), weight 71.9 kg (158 lb 8.2 oz), SpO2 100 %.  PHYSICAL EXAMINATION:  Physical Exam  GENERAL:  81 y.o.-year-old patient lying in the bed with no acute distress.  EYES: Pupils equal, round, reactive to light and accommodation. No scleral icterus. Extraocular muscles intact.  HEENT: Head atraumatic, normocephalic. Oropharynx and nasopharynx clear.  Increased oral secretions NECK:  Supple, no jugular venous distention. No thyroid enlargement, no tenderness.  LUNGS: Normal breath sounds bilaterally, decreased bibasilar breath sounds, rhonchi heard diffusely,  no wheezing. No use of accessory muscles of respiration.  CARDIOVASCULAR: S1, S2 normal. No rubs, or gallops. 3/6 systolic murmur present ABDOMEN: Soft, nontender, nondistended. Bowel sounds present. No organomegaly or mass.  EXTREMITIES: No cyanosis, or clubbing. 1+ pedal edema present Complains of tenderness when her legs are being examined NEUROLOGIC: patient is alert,  following commands. Cranial nerves II through XII are intact. No focal deficits noted. Sensation intact. Gait not checked.  PSYCHIATRIC: The patient is more alert, appears weak SKIN: No obvious rash, lesion, or ulcer.    LABORATORY PANEL:   CBC  Recent Labs Lab 07/29/15 0547  WBC 8.6  HGB 10.3*  HCT 31.8*  PLT 146*   ------------------------------------------------------------------------------------------------------------------  Chemistries   Recent Labs Lab 07/24/15 0434  07/28/15 1330  NA 137  < > 142  K 2.5*  < > 3.3*  CL 99*  < > 104  CO2 29  < > 32  GLUCOSE 166*  < > 117*  BUN 14  < > 20  CREATININE 2.08*  < > 3.39*  CALCIUM 8.2*  < > 8.9  MG 1.8  --   --   < > = values in this interval not displayed. ------------------------------------------------------------------------------------------------------------------  Cardiac Enzymes No results for input(s): TROPONINI in the last 168 hours. ------------------------------------------------------------------------------------------------------------------  RADIOLOGY:  No results found.  EKG:   Orders placed or performed during the hospital encounter of 07/17/15  .  ED EKG  . ED EKG  . EKG 12-Lead  . EKG 12-Lead    ASSESSMENT AND PLAN:   Principal Problem:  Aspiration pneumonia (HCC) Active Problems:  CKD (chronic kidney  disease), stage IV (HCC)  Pressure ulcer  Chronic respiratory failure (HCC)  HTN (hypertension), benign  * aspiration Vs HCAP pneumonia- causing acute on chronic respiratory distress.  Associated sepsis - present on admission- with altered mental status. vanc discontinued as blood cultures negative, on zosyn- stop after 10 days of ABX- after 07/26/15 dose  Swallow evaluation with dysphagia diet, nectar thick\  appreciate pulm consult - f/u CXR -with persistent infiltrate, likely from aspiration pneumonia. Also has some pulmonary vascular congestion and small pleural effusions.  * Chronic respiratory failure  On oxygen, continue. Now weaned to 3L nasal cannula - CXR with pulm vascular congestion- Improved breathing with dialysis - patient also has chronic respiratory failure due to underlying COPD with frequent hypercarbia -BiPAP device has been considered and has been ruled out as an effective treatment due to inability to tolerate and frequent need for it. -Patient will need noninvasive positive pressure ventilation due to severity of her disease state and life-threatening condition including CO2 retention. -Patient will need nocturnal usage of such ventilation and also during the day as needed due to increased probability of acute exacerbations. And without such ventilatory support serious, or death could occur. - care manager trying to arrange that.  * Dysphagia- poor PO intake continues. Daughter aware - constant secretions and has a suction catheter beside her at all times - poor intake. -had MBSS, on nectar thick liquids and dysphagia diet  * ESRD  on PD- nephro consult. Hemodialysis S today and tolerated well. - she will need 2 weeks of HD prior to transitioning to PD while family gets the PD orientation -Has peritoneal dialysis catheter and family want to continue to do PD as outpatient Also has a right chest permacath for HD in hospital - appreciate nephrology consult,   -Patient and family want to continue dialysis at this point  * Htn  On Coreg, clonidine and Lasix Also on oral hydralazine.  * CVA Hx  Bedbound- is total care.  Family do not want any rehabilitation. They want to take her home.  * Hx of seizure disorder  On dilantin-  liquid form  * DVT Prophylaxis- SQ Heparin   Cont to monitor, physical therapy consult when able to Overall poor prognosis. Appreciate palliative care input   PLAN is to discharge home with daughter and niece caring for her- once Bipap is arranged at home. Also once outpatient hemodialysis is set up. Likely tomorrow   All the records are reviewed and case discussed with Care Management/Social Workerr. Management plans discussed with the patient, family and they are in agreement.  CODE STATUS: DNR  TOTAL TIME TAKING CARE OF THIS PATIENT: 37 minutes.     Enid BaasKALISETTI,Earley Grobe M.D on 07/29/2015 at 2:44 PM  Between 7am to 6pm - Pager - [phone]  After 6pm go to [email or web address] - password [email or web address]  [facility]  Office  [phone]  CC: Primary care physician; Jaclyn ShaggyATE,DENNY C, MD

## 2015-07-29 NOTE — Consult Note (Addendum)
WOC wound follow up Wound type: Unstageable pressure injury to coccyx, present on admission.  Measurement: 3 cm x 3 cm x 1 cm but unable to visualize wound bed due to the presence of slough Bilateral blanchable redness to heels.  Pressure is offloaded with pillow under calves.  Wound bed:100% adherent slough Drainage (amount, consistency, odor) Minimal serosanguinous Periwound:Intact Dressing procedure/placement/frequency:Cleanse coccyx wound with NS and pat gently dry.  Apply Santyl to wound bed.  Gently fill wound depth with NS moist gauze.  Cover with ABD pad and tape.  Change daily. Continue no use of disposable pads under patient and offload heels.  Will not follow at this time.  Please re-consult if needed.  Maple HudsonKaren Shara Hartis RN BSN CWON Pager [phone]

## 2015-07-29 NOTE — Progress Notes (Signed)
[facility]  ROUNDING NOTE   Subjective:   Patient is lethargic this morning Able to answer simple questions Denies acute complaints  Objective:  Vital signs in last 24 hours:  Temp:  [97.6 F (36.4 C)-99.9 F (37.7 C)] 98.3 F (36.8 C) (12/26 0643) Pulse Rate:  [81-86] 83 (12/26 0643) Resp:  [17-20] 20 (12/26 0643) BP: (139-148)/(48-55) 146/55 mmHg (12/26 0643) SpO2:  [94 %-100 %] 100 % (12/26 0643)  Weight change: 0.685 kg (1 lb 8.2 oz) Filed Weights   07/27/15 0500 07/28/15 0500 07/28/15 1325  Weight: 72.258 kg (159 lb 4.8 oz) 71.215 kg (157 lb) 71.9 kg (158 lb 8.2 oz)    Intake/Output: I/O last 3 completed shifts: In: 0  Out: 500 [Other:500]   Intake/Output this shift:     Physical Exam: General: Ill appearing  Head:  atraumatic. Moist oral mucosal membranes  Eyes: Anicteric  Neck: Supple, trachea midline  Lungs:   normal respiratory effort,  few basilar crackles bilaterally   Heart: S1S2 no rubs  Abdomen:  Soft, nontender, BS present   Extremities:  trace peripheral edema.  Neurologic: Lethargic today  Skin: No acute lesions  Access: PD catheter in place, RIJ permcath    Basic Metabolic Panel:  Recent Labs Lab 07/23/15 0527 07/24/15 0434 07/25/15 1326 07/25/15 1951 07/26/15 0716 07/28/15 1330  NA 136 137 133*  --  140 142  K 3.0* 2.5* 2.7* 3.0* 3.3* 3.3*  CL 101 99* 98*  --  104 104  CO2 --  31 32  GLUCOSE 84 166* 104*  --  98 117*  BUN 26* 14 19  --  10 20  CREATININE 3.19* 2.08* 2.93*  --  1.96* 3.39*  CALCIUM 8.2* 8.2* 8.5*  --  8.5* 8.9  MG  --  1.8  --   --   --   --   PHOS  --   --   --   --   --  3.7    Liver Function Tests:  Recent Labs Lab 07/28/15 1330  ALBUMIN 1.7*   No results for input(s): LIPASE, AMYLASE in the last 168 hours. No results for input(s): AMMONIA in the last 168 hours.  CBC:  Recent Labs Lab 07/23/15 0527 07/25/15 1326 07/26/15 0716 07/28/15 1330 07/29/15 0547  WBC 5.3 6.8  6.6 7.4 8.6  HGB 10.1* 11.0* 10.4* 10.1* 10.3*  HCT 31.5* 34.8* 32.2* 31.6* 31.8*  MCV 94.2 94.7 93.0 92.9 92.7  PLT 169 161 145* 154 146*    Cardiac Enzymes: No results for input(s): CKTOTAL, CKMB, CKMBINDEX, TROPONINI in the last 168 hours.  BNP: Invalid input(s): POCBNP  CBG:  Recent Labs Lab 07/25/15 1952 07/26/15 0010 07/26/15 0348 07/26/15 0723 07/26/15 1154  GLUCAP 110* 111* 95 87 113*    Microbiology: Results for orders placed or performed during the hospital encounter of 07/17/15  Blood culture (routine x 2)     Status: None   Collection Time: 07/17/15  4:02 AM  Result Value Ref Range Status   Specimen Description BLOOD RIGHT HAND  Final   Special Requests BOTTLES DRAWN AEROBIC AND ANAEROBIC  Final   Culture NO GROWTH 5 DAYS  Final   Report Status 07/22/2015 FINAL  Final  Blood culture (routine x 2)     Status: None   Collection Time: 07/17/15  4:02 AM  Result Value Ref Range Status   Specimen Description BLOOD LEFT ANTECUBITAL  Final   Special Requests BOTTLES  DRAWN AEROBIC AND ANAEROBIC  Final   Culture  Setup Time   Final    GRAM POSITIVE COCCI IN CLUSTERS AEROBIC BOTTLE ONLY CRITICAL RESULT CALLED TO, READ BACK BY AND VERIFIED WITH: MARCELLA TURNER AT 2253 07/17/2015 BY TFK CONFIRMED BY MLZ    Culture   Final    COAGULASE NEGATIVE STAPHYLOCOCCUS AEROBIC BOTTLE ONLY Results consistent with contamination.    Report Status 07/22/2015 FINAL  Final  MRSA PCR Screening     Status: Abnormal   Collection Time: 07/17/15  6:51 AM  Result Value Ref Range Status   MRSA by PCR POSITIVE (A) NEGATIVE Final    Comment:        The GeneXpert MRSA Assay (FDA approved for NASAL specimens only), is one component of a comprehensive MRSA colonization surveillance program. It is not intended to diagnose MRSA infection nor to guide or monitor treatment for MRSA infections. CRITICAL RESULT CALLED TO, READ BACK BY AND VERIFIED WITH: Yehuda Savannah @ 07/16/16  by [facility]   Culture, sputum-assessment     Status: None   Collection Time: 07/17/15  5:44 PM  Result Value Ref Range Status   Specimen Description SPUTUM  Final   Special Requests NONE  Final   Sputum evaluation   Final    Sputum specimen not acceptable for testing.  Please recollect.   CALLED TO MARCELLA TURNER @ 2009 ON 07/17/2015 BY [facility]   Report Status 07/17/2015 FINAL  Final  Culture, blood (single)     Status: None   Collection Time: 07/18/15 10:02 AM  Result Value Ref Range Status   Specimen Description BLOOD LEFT HAND  Final   Special Requests BOTTLES DRAWN AEROBIC AND ANAEROBIC  2CC  Final   Culture NO GROWTH 5 DAYS  Final   Report Status 07/23/2015 FINAL  Final    Coagulation Studies: No results for input(s): LABPROT, INR in the last 72 hours.  Urinalysis: No results for input(s): COLORURINE, LABSPEC, PHURINE, GLUCOSEU, HGBUR, BILIRUBINUR, KETONESUR, PROTEINUR, UROBILINOGEN, NITRITE, LEUKOCYTESUR in the last 72 hours.  Invalid input(s): APPERANCEUR    Imaging: No results found.   Medications:     . amLODipine  10 mg Oral QPM  . antiseptic oral rinse  7 mL Mouth Rinse BID  . aspirin  81 mg Oral Daily  . carvedilol  12.5 mg Oral Q12H  . cloNIDine  0.1 mg Oral TID  . collagenase   Topical Daily  . cyanocobalamin  500 mcg Oral Daily  . docusate  100 mg Oral QODAY  . fentaNYL  25 mcg Transdermal Q72H  . fluticasone  2 spray Each Nare Daily  . furosemide  40 mg Oral Daily  . gentamicin cream  1 application Topical Daily  . heparin  5,000 Units Subcutaneous Q12H  . hydrALAZINE  50 mg Oral TID  . latanoprost  1 drop Both Eyes QHS  . lidocaine  1 patch Transdermal Q24H  . loratadine  10 mg Oral Daily  . mirtazapine  15 mg Oral QHS  . multivitamin-lutein  1 capsule Oral BID  . phenytoin  300 mg Oral QHS  . predniSONE  10 mg Oral Daily  . ascorbic acid  500 mg Oral Daily   acetaminophen, albuterol, hydrALAZINE, lactulose, morphine injection, nitroGLYCERIN,  [DISCONTINUED] ondansetron **OR** ondansetron (ZOFRAN) IV  Assessment/ Plan:  81 y.o. female with past medical history of hypertension, obstructive sleep apnea, DJD, CVA with residual left sided weakness, carotid stenosis status post L CEA 2/15, seizure disorder, diastolic congestive  heart failure, anemia Daughter: Silvio PateKelly, Grand daughter - April  [facility] PD Delena Serveavita Cheree DittoGraham  1. End Stage Renal Disease on PD: Patient's daughter had started PD training as outpatient but got only 2 out of 8 days.   emergent HD 12/18. 2500 cc removed.  Patient appears to continue to want aggressive care as she is aware that quitting dialysis would mean hospice and death in near future.  Plan: Last dialysis was done on Sunday. Next HD in AM (tuesday) Transition to PD as outpatient- PD training for daughter on Thursday Back up HD on Friday and next tuesday Family working to [facility] [employer] [facility] D/c planning coordinated between family, Outpatient HD (notified nurse N'Shaminy) and outpatient PD  2. Hypertension: Allergy to enalapril (causes anaphylaxis). Blood pressures now at goal.  - home regimen of amlodipine, carvedilol, clonidine, furosemide, hydralazine.  - If blood pressure tends to decrease, would discontinue clonidine and decrease the dose of hydralazine  - Good blood pressure range systolic 140-160   3. Secondary hyperparathyroidism. Phos and calcium low outpatient due to poor nutritional status. Not currently on binders.   4. Anemia chronic kidney disease:   - low dose epo with HD.   5.  Bacterial pneumonia/respiratory failure:  Suspect aspiration.  Abx continued  6. Hypokalemia - replaced iv /po  - 4 K dialysate  7. Palliative care team following  8. Acute pulm edema - requiring BiPAP off and on        LOS: 12 Ruthel Martine 12/26/20161:26 PM

## 2015-07-29 NOTE — Care Management Note (Signed)
Case Management Note  Patient Details  Name: Kristina Wilson MRN: [id number] Date of Birth: 1922-12-23  Subjective/Objective:    Per [facility], the Trilogy is scheduled to be delivered and set up today at approx. 2pm today. They are requesting the presence of a Respiratory therapist during the Trilogy set up.                 Action/Plan:   Expected Discharge Date:                  Expected Discharge Plan:     In-House Referral:     Discharge planning Services     Post Acute Care Choice:    Choice offered to:     DME Arranged:    DME Agency:     HH Arranged:    HH Agency:     Status of Service:     Medicare Important Message Given:    Date Medicare IM Given:    Medicare IM give by:    Date Additional Medicare IM Given:    Additional Medicare Important Message give by:     If discussed at Long Length of Stay Meetings, dates discussed:    Additional Comments:  Jaleeah Slight A, RN 07/29/2015, 9:28 AM

## 2015-07-29 NOTE — Care Management Important Message (Signed)
Important Message  Patient Details  Name: Kathie RhodesJacquelyn R Medici MRN: [id number] Date of Birth: 05-Dec-1922   Medicare Important Message Given:  Yes    Bodhi Moradi A, RN 07/29/2015, 10:05 AM

## 2015-07-30 ENCOUNTER — Inpatient Hospital Stay: Payer: Medicare Other

## 2015-07-30 LAB — BLOOD GAS, ARTERIAL
ALLENS TEST (PASS/FAIL): POSITIVE — AB
Acid-Base Excess: 8.6 mmol/L — ABNORMAL HIGH (ref 0.0–3.0)
BICARBONATE: 34 meq/L — AB (ref 21.0–28.0)
DELIVERY SYSTEMS: POSITIVE
EXPIRATORY PAP: 10
FIO2: 0.45
Inspiratory PAP: 15
LHR: 21 {breaths}/min
O2 Saturation: 99.5 %
PATIENT TEMPERATURE: 37
PH ART: 7.44 (ref 7.350–7.450)
pCO2 arterial: 50 mmHg — ABNORMAL HIGH (ref 32.0–48.0)
pO2, Arterial: 162 mmHg — ABNORMAL HIGH (ref 83.0–108.0)

## 2015-07-30 LAB — GLUCOSE, CAPILLARY: Glucose-Capillary: 87 mg/dL (ref 65–99)

## 2015-07-30 LAB — BASIC METABOLIC PANEL
Anion gap: 7 (ref 5–15)
BUN: 24 mg/dL — AB (ref 6–20)
CALCIUM: 8.7 mg/dL — AB (ref 8.9–10.3)
CO2: 28 mmol/L (ref 22–32)
CREATININE: 2.67 mg/dL — AB (ref 0.44–1.00)
Chloride: 105 mmol/L (ref 101–111)
GFR calc Af Amer: 17 mL/min — ABNORMAL LOW (ref >60)
GFR, EST NON AFRICAN AMERICAN: 15 mL/min — AB (ref >60)
GLUCOSE: 96 mg/dL (ref 65–99)
POTASSIUM: 4.3 mmol/L (ref 3.5–5.1)
SODIUM: 140 mmol/L (ref 135–145)

## 2015-07-30 MED ORDER — AMOXICILLIN-POT CLAVULANATE 875-125 MG PO TABS: 1.0000 | ORAL_TABLET | Freq: Two times a day (BID) | ORAL | Status: DC

## 2015-07-30 MED ORDER — PHENYTOIN 125 MG/5ML PO SUSP: 300.0000 mg | Freq: Every day | ORAL | Status: DC

## 2015-07-30 MED ORDER — ALBUTEROL SULFATE (2.5 MG/3ML) 0.083% IN NEBU: 2.5000 mg | INHALATION_SOLUTION | RESPIRATORY_TRACT | Status: AC | PRN

## 2015-07-30 NOTE — Progress Notes (Signed)
[facility]  ROUNDING NOTE   Subjective:  Patient seen during hemodialysis. She is currently sitting up. She is lethargic but arousable.  Objective:  Vital signs in last 24 hours:  Temp:  [98.1 F (36.7 C)-100.8 F (38.2 C)] 98.2 F (36.8 C) (12/27 0915) Pulse Rate:  [78-92] 80 (12/27 1100) Resp:  [16-23] 21 (12/27 1100) BP: (98-146)/(45-82) 112/47 mmHg (12/27 1100) SpO2:  [95 %-100 %] 100 % (12/27 1100) Weight:  [70.444 kg (155 lb 4.8 oz)] 70.444 kg (155 lb 4.8 oz) (12/27 0915)  Weight change: -1.456 kg (-3 lb 3.4 oz) Filed Weights   07/28/15 1325 07/30/15 0500 07/30/15 0915  Weight: 71.9 kg (158 lb 8.2 oz) 70.444 kg (155 lb 4.8 oz) 70.444 kg (155 lb 4.8 oz)    Intake/Output:     Intake/Output this shift:     Physical Exam: General: Ill appearing  Head:  atraumatic. Moist oral mucosal membranes, hard of hearing  Eyes: Anicteric  Neck: Supple, trachea midline  Lungs:   normal respiratory effort,  Clear anteriorly  Heart: S1S2 no rubs  Abdomen:  Soft, nontender, BS present   Extremities:  trace peripheral edema.  Neurologic: Lethargic but arousable  Skin: No acute lesions  Access: PD catheter in place, RIJ permcath    Basic Metabolic Panel:  Recent Labs Lab 07/24/15 0434 07/25/15 1326 07/25/15 1951 07/26/15 0716 07/28/15 1330 07/30/15 0416  NA 137 133*  --  140 142 140  K 2.5* 2.7* 3.0* 3.3* 3.3* 4.3  CL 99* 98*  --  104 104 105  CO2 29 28  --  31 32 28  GLUCOSE 166* 104*  --  98 117* 96  BUN 14 19  --  10 20 24*  CREATININE 2.08* 2.93*  --  1.96* 3.39* 2.67*  CALCIUM 8.2* 8.5*  --  8.5* 8.9 8.7*  MG 1.8  --   --   --   --   --   PHOS  --   --   --   --  3.7  --     Liver Function Tests:  Recent Labs Lab 07/28/15 1330  ALBUMIN 1.7*   No results for input(s): LIPASE, AMYLASE in the last 168 hours. No results for input(s): AMMONIA in the last 168 hours.  CBC:  Recent Labs Lab 07/25/15 1326 07/26/15 0716 07/28/15 1330  07/29/15 0547  WBC 6.8 6.6 7.4 8.6  HGB 11.0* 10.4* 10.1* 10.3*  HCT 34.8* 32.2* 31.6* 31.8*  MCV 94.7 93.0 92.9 92.7  PLT 161 145* 154 146*    Cardiac Enzymes: No results for input(s): CKTOTAL, CKMB, CKMBINDEX, TROPONINI in the last 168 hours.  BNP: Invalid input(s): POCBNP  CBG:  Recent Labs Lab 07/25/15 1952 07/26/15 0010 07/26/15 0348 07/26/15 0723 07/26/15 1154  GLUCAP 110* 111* 95 87 113*    Microbiology: Results for orders placed or performed during the hospital encounter of 07/17/15  Blood culture (routine x 2)     Status: None   Collection Time: 07/17/15  4:02 AM  Result Value Ref Range Status   Specimen Description BLOOD RIGHT HAND  Final   Special Requests BOTTLES DRAWN AEROBIC AND ANAEROBIC 5ML  Final   Culture NO GROWTH 5 DAYS  Final   Report Status 07/22/2015 FINAL  Final  Blood culture (routine x 2)     Status: None   Collection Time: 07/17/15  4:02 AM  Result Value Ref Range Status   Specimen Description BLOOD LEFT ANTECUBITAL  Final  Special Requests BOTTLES DRAWN AEROBIC AND ANAEROBIC  Final   Culture  Setup Time   Final    GRAM POSITIVE COCCI IN CLUSTERS AEROBIC BOTTLE ONLY CRITICAL RESULT CALLED TO, READ BACK BY AND VERIFIED WITH: MARCELLA TURNER AT 2253 07/17/2015 BY TFK CONFIRMED BY MLZ    Culture   Final    COAGULASE NEGATIVE STAPHYLOCOCCUS AEROBIC BOTTLE ONLY Results consistent with contamination.    Report Status 07/22/2015 FINAL  Final  MRSA PCR Screening     Status: Abnormal   Collection Time: 07/17/15  6:51 AM  Result Value Ref Range Status   MRSA by PCR POSITIVE (A) NEGATIVE Final    Comment:        The GeneXpert MRSA Assay (FDA approved for NASAL specimens only), is one component of a comprehensive MRSA colonization surveillance program. It is not intended to diagnose MRSA infection nor to guide or monitor treatment for MRSA infections. CRITICAL RESULT CALLED TO, READ BACK BY AND VERIFIED WITH: Yehuda Savannah @  07/16/16 by [facility]   Culture, sputum-assessment     Status: None   Collection Time: 07/17/15  5:44 PM  Result Value Ref Range Status   Specimen Description SPUTUM  Final   Special Requests NONE  Final   Sputum evaluation   Final    Sputum specimen not acceptable for testing.  Please recollect.   CALLED TO MARCELLA TURNER @ 2009 ON 07/17/2015 BY CAF   Report Status 07/17/2015 FINAL  Final  Culture, blood (single)     Status: None   Collection Time: 07/18/15 10:02 AM  Result Value Ref Range Status   Specimen Description BLOOD LEFT HAND  Final   Special Requests BOTTLES DRAWN AEROBIC AND ANAEROBIC  2CC  Final   Culture NO GROWTH 5 DAYS  Final   Report Status 07/23/2015 FINAL  Final    Coagulation Studies: No results for input(s): LABPROT, INR in the last 72 hours.  Urinalysis: No results for input(s): COLORURINE, LABSPEC, PHURINE, GLUCOSEU, HGBUR, BILIRUBINUR, KETONESUR, PROTEINUR, UROBILINOGEN, NITRITE, LEUKOCYTESUR in the last 72 hours.  Invalid input(s): APPERANCEUR    Imaging: No results found.   Medications:     . amLODipine  10 mg Oral QPM  . antiseptic oral rinse  7 mL Mouth Rinse BID  . aspirin  81 mg Oral Daily  . carvedilol  12.5 mg Oral Q12H  . cloNIDine  0.1 mg Oral TID  . collagenase   Topical Daily  . cyanocobalamin  500 mcg Oral Daily  . docusate  100 mg Oral QODAY  . fentaNYL  25 mcg Transdermal Q72H  . fluticasone  2 spray Each Nare Daily  . furosemide  40 mg Oral Daily  . gentamicin cream  1 application Topical Daily  . heparin  5,000 Units Subcutaneous Q12H  . hydrALAZINE  50 mg Oral TID  . latanoprost  1 drop Both Eyes QHS  . lidocaine  1 patch Transdermal Q24H  . loratadine  10 mg Oral Daily  . mirtazapine  15 mg Oral QHS  . multivitamin-lutein  1 capsule Oral BID  . phenytoin  300 mg Oral QHS  . predniSONE  10 mg Oral Daily  . ascorbic acid  500 mg Oral Daily   acetaminophen, albuterol, hydrALAZINE, lactulose, morphine injection,  nitroGLYCERIN, [DISCONTINUED] ondansetron **OR** ondansetron (ZOFRAN) IV  Assessment/ Plan:  81 y.o. female with past medical history of hypertension, obstructive sleep apnea, DJD, CVA with residual left sided weakness, carotid stenosis status post L CEA 2/15, seizure  disorder, diastolic congestive heart failure, anemia Daughter: Silvio Pate daughter - April  [facility] PD Delena Serve Cheree Ditto  1. End Stage Renal Disease on PD: Patient's daughter had started PD training as outpatient but got only 2 out of 8 days.   emergent HD 12/18. 2500 cc removed.  Patient appears to continue to want aggressive care as she is aware that quitting dialysis would mean hospice and death in near future.  Plan: Patient seen and evaluated during hemodialysis. Tolerating well and sitting up. She will continue to have backup hemodialysis as she continues to undergo peritoneal dialysis training in the home. We will continue to monitor her progress closely.  2. Hypertension: Allergy to enalapril (causes anaphylaxis). Blood pressures now at goal.  - home regimen of amlodipine, carvedilol, clonidine, furosemide, hydralazine.  - blood pressure currently 112/47.   3. Secondary hyperparathyroidism. Follow bone mineral metabolism parameters as outpt.  4. Anemia chronic Wilson disease:   - low dose epo with HD.   5.  Bacterial pneumonia/respiratory failure:  Suspect aspiration.  Abx continued  6. Hypokalemia - K up to 4.5.  7. Palliative care team following  8. Acute pulm edema - continue UF with HD.        LOS: 13 Shye Doty 12/27/201611:16 AM

## 2015-07-30 NOTE — Progress Notes (Signed)
Patient yelling out and trying to remove Bi Pap. When nurse entered the room patient stated " please get this thing off of me". Bi pap mask removed and nasal cannula placed back on patient. Patient is also very non compliant with being turned every two hours.

## 2015-07-30 NOTE — Discharge Summary (Addendum)
[facility] [place] at [facility]   PATIENT NAME: Kristina Wilson    MR#:  [id number]  DATE OF BIRTH:  1922/08/17  DATE OF ADMISSION:  07/17/2015 ADMITTING PHYSICIAN: Crissie Figures, MD  DATE OF DISCHARGE: 07/30/15  PRIMARY CARE PHYSICIAN: Jaclyn Shaggy, MD    ADMISSION DIAGNOSIS:  Aspiration pneumonia of both lower lobes, unspecified aspiration pneumonia type (HCC) [J69.0]  DISCHARGE DIAGNOSIS:  Principal Problem:   Aspiration pneumonia (HCC) Active Problems:   CKD (chronic kidney disease), stage IV (HCC)   Pressure ulcer   Chronic respiratory failure (HCC)   HTN (hypertension), benign   Pneumonia   SECONDARY DIAGNOSIS:   Past Medical History  Diagnosis Date  . Muscle weakness   . Chronic respiratory failure (HCC)   . Pneumonia   . Sleep apnea   . CHF (congestive heart failure) (HCC)   . CKD (chronic kidney disease), stage IV (HCC)   . Dysphagia   . GERD (gastroesophageal reflux disease)   . Back pain   . Epilepsy (HCC)   . Hypertension   . Stroke (HCC)   . Headache   . Arthritis   . Anemia     iron deficiency anemia  . Complication of anesthesia     difficult waking up after surgery (2013)    HOSPITAL COURSE:   Principal Problem:  Aspiration pneumonia (HCC) Active Problems:  CKD (chronic kidney disease), stage IV (HCC)  Pressure ulcer  Chronic respiratory failure (HCC)  HTN (hypertension), benign  * aspiration Vs HCAP pneumonia- causing acute on chronic respiratory distress.  Associated sepsis - present on admission- with altered mental status. von zosyn-for 10 days of ABX- add augmentin for home for 4 more days as low grade temp last night. Likely will continue to aspirate  Swallow evaluation with dysphagia diet, nectar thick- had MBSS done.  appreciate pulm consult - f/u CXR -with persistent infiltrate, likely from aspiration pneumonia. Also has some pulmonary vascular congestion and small pleural  effusions.  * Chronic respiratory failure  On oxygen, continue. Now weaned to 3L nasal cannula - CXR with pulm vascular congestion- Improved breathing with dialysis - patient also has chronic respiratory failure due to underlying COPD with frequent hypercarbia - Patient is currently requiring the use of noninvasive vent nocturnally and also throughout the day as needed due to her fatigue and shortness of breath -She will need a noninvasive ventilation device at home prior to discharge.  * Dysphagia- poor PO intake continues. Daughter aware - constant secretions and has a suction catheter beside her at all times - poor intake. -had MBSS, on nectar thick liquids and dysphagia diet  * ESRD  on PD- nephro consult. Hemodialysis today prior to discharge. - she will need 2 weeks of HD prior to transitioning to PD while family gets the PD orientation -Has peritoneal dialysis catheter and family want to continue to do PD as outpatient Also has a right chest permacath for HD in hospital - appreciate nephrology consult,  -Patient and family want to continue dialysis at this point  * Htn  On Coreg, clonidine and Lasix Also on oral hydralazine.  * CVA Hx  Bedbound- is total care.  Family do not want any rehabilitation. They want to take her home.  * Hx of seizure disorder  On dilantin- liquid form   Overall poor prognosis. Appreciate palliative care input   PLAN is to discharge home with daughter and niece caring for her-  Bipap is arranged at home. Also  outpatient hemodialysis is set up until PD can be started at home.   DISCHARGE CONDITIONS:   Guarded  CONSULTS OBTAINED:   Nephrology consult by Dr. Thedore MinsSingh Palliative care consult by Dr. Orvan Falconerampbell Pulmonary consult by Dr. Dema SeverinMungal  DRUG ALLERGIES:   Allergies  Allergen Reactions  . Enalapril Anaphylaxis  . Tramadol Shortness Of Breath  . Capsaicin Other (See Comments)    Reaction:  Unknown   . Codeine Other (See  Comments)    Reaction:  Unknown   . Hydrocodone Other (See Comments)    Reaction:  Unknown   . Vicodin [Hydrocodone-Acetaminophen] Other (See Comments)    Reaction:  Unknown     DISCHARGE MEDICATIONS:   Current Discharge Medication List    START taking these medications   Details  albuterol (PROVENTIL) (2.5 MG/3ML) 0.083% nebulizer solution Take 3 mLs (2.5 mg total) by nebulization every 2 (two) hours as needed for wheezing. Qty: 75 mL, Refills: 12    amoxicillin-clavulanate (AUGMENTIN) 875-125 MG tablet Take 1 tablet by mouth 2 (two) times daily. For 4 more days Qty: 8 tablet, Refills: 0    phenytoin (DILANTIN) 125 MG/5ML suspension Take 12 mLs (300 mg total) by mouth at bedtime. Qty: 237 mL, Refills: 12      CONTINUE these medications which have NOT CHANGED   Details  acetaminophen (TYLENOL) 325 MG tablet Take 650 mg by mouth 3 (three) times daily as needed for mild pain, fever or headache.     amLODipine (NORVASC) 10 MG tablet Take 10 mg by mouth every evening.    ascorbic acid (VITAMIN C) 500 MG tablet Take 500 mg by mouth daily.    aspirin EC 81 MG tablet Take 81 mg by mouth daily.    bimatoprost (LUMIGAN) 0.03 % ophthalmic solution Place 1 drop into both eyes 2 (two) times daily.    carvedilol (COREG) 12.5 MG tablet Take 12.5 mg by mouth every 12 (twelve) hours.    cetirizine (ZYRTEC) 10 MG tablet Take 10 mg by mouth at bedtime.    cloNIDine (CATAPRES) 0.1 MG tablet Take 0.1 mg by mouth 3 (three) times daily.    Coenzyme Q10 (COQ10) 50 MG CAPS Take 50 mg by mouth daily.    cyanocobalamin 500 MCG tablet Take 500 mcg by mouth daily.    docusate sodium (COLACE) 100 MG capsule Take 100 mg by mouth every other day.    fentaNYL (DURAGESIC - DOSED MCG/HR) 25 MCG/HR patch Place 25 mcg onto the skin every 3 (three) days.    fluticasone (FLONASE) 50 MCG/ACT nasal spray Place 2 sprays into both nostrils daily.    furosemide (LASIX) 40 MG tablet Take 40 mg by mouth  daily.    gentamicin ointment (GARAMYCIN) 0.1 % Apply 1 application topically 3 (three) times daily. Apply to left side port three times daily.    hydrALAZINE (APRESOLINE) 100 MG tablet Take 100 mg by mouth 3 (three) times daily.    lactulose (CHRONULAC) 10 GM/15ML solution Take 20 g by mouth daily as needed for mild constipation.    lidocaine (LIDODERM) 5 % Place 1 patch onto the skin daily. Remove & Discard patch within 12 hours or as directed by MD    mirtazapine (REMERON) 15 MG tablet Take 15 mg by mouth at bedtime.    Multiple Vitamins-Minerals (PRESERVISION AREDS PO) Take 1 capsule by mouth 2 (two) times daily.    nitroGLYCERIN (NITROSTAT) 0.4 MG SL tablet Place 0.4 mg under the tongue every 5 (five) minutes as needed  for chest pain.    oxycodone (OXY-IR) 5 MG capsule Take 5 mg by mouth every 4 (four) hours as needed for pain.    pantoprazole (PROTONIX) 40 MG tablet Take 40 mg by mouth 2 (two) times daily.    predniSONE (DELTASONE) 10 MG tablet Take 10 mg by mouth daily.      STOP taking these medications     phenytoin (DILANTIN) 100 MG ER capsule          DISCHARGE INSTRUCTIONS:   1. PCP f/u in 1 week 2. Nectar thick liquids, puree diet with strict aspiration precautions 3. For hemodialysis per nephrology plan. Follow up with nephrology in 2 days 4. Home health  If you experience worsening of your admission symptoms, develop shortness of breath, life threatening emergency, suicidal or homicidal thoughts you must seek medical attention immediately by calling 911 or calling your MD immediately  if symptoms less severe.  You Must read complete instructions/literature along with all the possible adverse reactions/side effects for all the Medicines you take and that have been prescribed to you. Take any new Medicines after you have completely understood and accept all the possible adverse reactions/side effects.   Please note  You were cared for by a hospitalist during  your hospital stay. If you have any questions about your discharge medications or the care you received while you were in the hospital after you are discharged, you can call the unit and asked to speak with the hospitalist on call if the hospitalist that took care of you is not available. Once you are discharged, your primary care physician will handle any further medical issues. Please note that NO REFILLS for any discharge medications will be authorized once you are discharged, as it is imperative that you return to your primary care physician (or establish a relationship with a primary care physician if you do not have one) for your aftercare needs so that they can reassess your need for medications and monitor your lab values.    Today   CHIEF COMPLAINT:   Chief Complaint  Patient presents with  . Shortness of Breath    VITAL SIGNS:  Blood pressure 141/47, pulse 92, temperature 98.1 F (36.7 C), temperature source Oral, resp. rate 19, height 5\' 6"  (1.676 m), weight 70.444 kg (155 lb 4.8 oz), SpO2 95 %.  I/O:   Intake/Output Summary (Last 24 hours) at 07/30/15 0919 Last data filed at 07/30/15 0531  Gross per 24 hour  Intake      0 ml  Output      0 ml  Net      0 ml    PHYSICAL EXAMINATION:   Physical Exam  GENERAL: 81 y.o.-year-old patient lying in the bed with no acute distress.  EYES: Pupils equal, round, reactive to light and accommodation. No scleral icterus. Extraocular muscles intact.  HEENT: Head atraumatic, normocephalic. Oropharynx and nasopharynx clear. Increased oral secretions NECK: Supple, no jugular venous distention. No thyroid enlargement, no tenderness.  LUNGS: Normal breath sounds bilaterally, decreased bibasilar breath sounds, rhonchi heard diffusely, no wheezing. No use of accessory muscles of respiration.  CARDIOVASCULAR: S1, S2 normal. No rubs, or gallops. 3/6 systolic murmur present ABDOMEN: Soft, nontender, nondistended. Bowel sounds present. No  organomegaly or mass.  EXTREMITIES: No cyanosis, or clubbing. 1+ pedal edema present Complains of tenderness when her legs are being examined NEUROLOGIC: patient is alert, following commands. Cranial nerves II through XII are intact. No focal deficits noted. Sensation intact. Gait not checked.  PSYCHIATRIC: The patient is more alert, appears weak SKIN: No obvious rash, lesion, or ulcer.   DATA REVIEW:   CBC  Recent Labs Lab 07/29/15 0547  WBC 8.6  HGB 10.3*  HCT 31.8*  PLT 146*    Chemistries   Recent Labs Lab 07/24/15 0434  07/30/15 0416  NA 137  < > 140  K 2.5*  < > 4.3  CL 99*  < > 105  CO2 29  < > 28  GLUCOSE 166*  < > 96  BUN 14  < > 24*  CREATININE 2.08*  < > 2.67*  CALCIUM 8.2*  < > 8.7*  MG 1.8  --   --   < > = values in this interval not displayed.  Cardiac Enzymes No results for input(s): TROPONINI in the last 168 hours.  Microbiology Results  Results for orders placed or performed during the hospital encounter of 07/17/15  Blood culture (routine x 2)     Status: None   Collection Time: 07/17/15  4:02 AM  Result Value Ref Range Status   Specimen Description BLOOD RIGHT HAND  Final   Special Requests BOTTLES DRAWN AEROBIC AND ANAEROBIC  Final   Culture NO GROWTH 5 DAYS  Final   Report Status 07/22/2015 FINAL  Final  Blood culture (routine x 2)     Status: None   Collection Time: 07/17/15  4:02 AM  Result Value Ref Range Status   Specimen Description BLOOD LEFT ANTECUBITAL  Final   Special Requests BOTTLES DRAWN AEROBIC AND ANAEROBIC  Final   Culture  Setup Time   Final    GRAM POSITIVE COCCI IN CLUSTERS AEROBIC BOTTLE ONLY CRITICAL RESULT CALLED TO, READ BACK BY AND VERIFIED WITH: MARCELLA TURNER AT 2253 07/17/2015 BY TFK CONFIRMED BY MLZ    Culture   Final    COAGULASE NEGATIVE STAPHYLOCOCCUS AEROBIC BOTTLE ONLY Results consistent with contamination.    Report Status 07/22/2015 FINAL  Final  MRSA PCR Screening     Status:  Abnormal   Collection Time: 07/17/15  6:51 AM  Result Value Ref Range Status   MRSA by PCR POSITIVE (A) NEGATIVE Final    Comment:        The GeneXpert MRSA Assay (FDA approved for NASAL specimens only), is one component of a comprehensive MRSA colonization surveillance program. It is not intended to diagnose MRSA infection nor to guide or monitor treatment for MRSA infections. CRITICAL RESULT CALLED TO, READ BACK BY AND VERIFIED WITH: Yehuda Savannah @ 07/16/16 by [facility]   Culture, sputum-assessment     Status: None   Collection Time: 07/17/15  5:44 PM  Result Value Ref Range Status   Specimen Description SPUTUM  Final   Special Requests NONE  Final   Sputum evaluation   Final    Sputum specimen not acceptable for testing.  Please recollect.   CALLED TO MARCELLA TURNER @ 2009 ON 07/17/2015 BY CAF   Report Status 07/17/2015 FINAL  Final  Culture, blood (single)     Status: None   Collection Time: 07/18/15 10:02 AM  Result Value Ref Range Status   Specimen Description BLOOD LEFT HAND  Final   Special Requests BOTTLES DRAWN AEROBIC AND ANAEROBIC  2CC  Final   Culture NO GROWTH 5 DAYS  Final   Report Status 07/23/2015 FINAL  Final    RADIOLOGY:  No results found.  EKG:   Orders placed or performed during the hospital encounter of 07/17/15  . ED  EKG  . ED EKG  . EKG 12-Lead  . EKG 12-Lead      Management plans discussed with the patient, family and they are in agreement.  CODE STATUS:     Code Status Orders        Start     Ordered   Jul 20, 2015 [phone]  Do not attempt resuscitation (DNR)   Continuous    Question Answer Comment  In the event of cardiac or respiratory ARREST Do not call a "code blue"   In the event of cardiac or respiratory ARREST Do not perform Intubation, CPR, defibrillation or ACLS   In the event of cardiac or respiratory ARREST Use medication by any route, position, wound care, and other measures to relive pain and suffering. May use oxygen, suction  and manual treatment of airway obstruction as needed for comfort.   Comments RN may pronounce death      07-20-15 9604    Advance Directive Documentation        Most Recent Value   Type of Advance Directive  Out of facility DNR (pink MOST or yellow form)   Pre-existing out of facility DNR order (yellow form or pink MOST form)     "MOST" Form in Place?        TOTAL TIME TAKING CARE OF THIS PATIENT: 40 minutes.    Enid Baas M.D on 07/30/2015 at 9:19 AM  Between 7am to 6pm - Pager - [phone]  After 6pm go to [email or web address] - password [email or web address] [facility]  [facility]  Office  [phone]  CC: Primary care physician; Jaclyn Shaggy, MD

## 2015-07-30 NOTE — Discharge Instructions (Signed)
Bipap at bedtime and also during the day as needed

## 2015-07-30 NOTE — Progress Notes (Signed)
Patient still lethargic and unresponsive. Report called to Amada Jupiterale, RN in stepdown unit. Belongings gathered by family and patient transferred to CCU on 3L oxygen via bed escorted by orderly and family.  Ron ParkerHerron, Mykel Mohl D, RN

## 2015-07-30 NOTE — Progress Notes (Signed)
Visit [facility] on [facility]. Patient seen lying in bed, daughter Tresa EndoKelly and granddaughter April at bedside. Patient required Bipap after returning from hemodialysis this afternoon and she remains on Bipap at this visit. Tresa EndoKelly and April report that patient became very lethargic with slurred speech, Bipap was placed back on and family has requested a CXR and ABG. They do not feel that they can take her back home unless she is alert enough to swallow her pills, which she was earlier in the day. Plan was for discharge today.  Patient will have a noninvasive ventilator in place in her home, provided by [facility]. Family to complete peritoneal dialysis training at home. They still need 6 more sessions per Tresa EndoKelly. Current plan is for patient to be transported to outpatient hemodialysis from home via [facility] on Friday and possibly again on Monday. Tresa EndoKelly stated again that she feels that her mother "is not done fighting".  Emotional support offered. Updated information faxed to referral intake. Will continue to follow through final disposition. Dayna BarkerKaren Robertson RN, BSN, [facility], [facility] L[facility]ison [phone] c

## 2015-07-30 NOTE — Progress Notes (Signed)
Hemodialysis start 

## 2015-07-30 NOTE — Progress Notes (Signed)
Pre-hd tx 

## 2015-07-30 NOTE — Progress Notes (Signed)
Patient appeared to be resting comfortably on Bipap. Bipap removed and patient attempted to be awoken. Patient not responding to any stimulation. BP 118/51 mmHg  Pulse 88  Temp(Src) 98.7 F (37.1 C) (Oral)  Resp 18  Ht 5\' 6"  (1.676 m)  Wt 70.444 kg (155 lb 4.8 oz)  BMI 25.08 kg/m2  SpO2 100%. Family at bedside assisting to help wake up patient. Dr. Nemiah CommanderKalisetti notified and ABG, CT, and transfer order entered per MD orders. Patient went to CT with no complications. Arrived back to unit lethargic and unresponsive. Will continue to monitor.   Ron ParkerHerron, Normal Recinos D, RN

## 2015-07-30 NOTE — Progress Notes (Signed)
eLink Physician-Brief Progress Note Patient Name: Kathie RhodesJacquelyn R Adel DOB: 1923/01/25 MRN: [id number]   Date of Service  07/30/2015  HPI/Events of Note  Progressive ESRD, encephalopathy, patient is DNR  eICU Interventions  No interventions at this time, Recommend comfort care measures        Erin FullingKurian Jana Swartzlander 07/30/2015, 8:52 PM

## 2015-07-30 NOTE — Progress Notes (Signed)
Patient arrived from Dialysis. VSS. Alert, and responding appropriately. Daughter arrived in room approximately 45 minutes later and called the RN to come assess the patient. Patient was up in chair, pale, and slumped to the left. Sternal rub performed to arouse the patient. VSS. Respiratory notified to come re-apply Bi-pap. Dr. Nemiah CommanderKalisetti notified of condition. No new orders. Bi-pap requested to be re-applied by MD. Patient put back into bed via lift and cleaned up from BM. Patient alert and responsive during transfer back to bed and fighting hospital staff while bipap attempted to be reapplied.  Bipap put on successfully with family at bedside and patient resting comfortably. Chest x-ray requested by granddaughter, Dr. Nemiah CommanderKalisetti notified and ordered xray. Will continue to monitor.  Ron ParkerHerron, Jim Philemon D, RN

## 2015-07-30 NOTE — Progress Notes (Signed)
Pallaitive Care Update  I have spoken with Hospice Liaison and confirmed that pt is to go back home today with Trilogy and ongoing hemodialysis with PD training to start and continue going forward. Pt is lethargic but arousable.  She is now to be on a dysphagia diet with nectar thick liquids based on findings from multiple swallowing screens and an MBSS.    I had had several meetings with patient's daughter and she says she will continue wanting HD / PD/ aggressive care --as long as pt seems to be asking for this approach.  I have a note in the record detailing how she was counselled to pay attention to a time that is coming whent there will be 'diminishing returns' from aggressive care and also a time when pt will be more ambivalent about dialysis etc.  That will be the time to consider changing over to comfort/ hospice care.   I will sign off at this time.    Suan HalterMargaret F Berman Grainger, MD

## 2015-07-30 NOTE — Progress Notes (Signed)
Post hd tx 

## 2015-07-31 MED ORDER — PHENYTOIN SODIUM EXTENDED 100 MG PO CAPS: 100.0000 mg | ORAL_CAPSULE | Freq: Three times a day (TID) | ORAL | Status: DC

## 2015-07-31 MED ORDER — ONDANSETRON HCL 4 MG PO TABS: 4.0000 mg | ORAL_TABLET | Freq: Three times a day (TID) | ORAL | Status: AC | PRN

## 2015-07-31 MED ORDER — LORAZEPAM 0.5 MG PO TABS: 0.5000 mg | ORAL_TABLET | Freq: Three times a day (TID) | ORAL | Status: DC

## 2015-07-31 MED ORDER — MORPHINE SULFATE 20 MG/5ML PO SOLN: 2.5000 mg | ORAL | Status: DC | PRN

## 2015-07-31 MED ORDER — ONDANSETRON HCL 4 MG/2ML IJ SOLN: 4.0000 mg | Freq: Four times a day (QID) | INTRAMUSCULAR | Status: DC | PRN

## 2015-07-31 MED ORDER — MORPHINE SULFATE (CONCENTRATE) 10 MG/0.5ML PO SOLN: 5.0000 mg | ORAL | Status: AC | PRN

## 2015-07-31 MED ORDER — LORAZEPAM 0.5 MG PO TABS: 0.5000 mg | ORAL_TABLET | ORAL | Status: AC | PRN

## 2015-07-31 MED ORDER — PHENYTOIN SODIUM EXTENDED 100 MG PO CAPS: 300.0000 mg | ORAL_CAPSULE | Freq: Three times a day (TID) | ORAL | Status: AC

## 2015-07-31 MED ORDER — MORPHINE SULFATE 20 MG/5ML PO SOLN: ORAL | Status: DC

## 2015-07-31 MED ORDER — PHENYTOIN SODIUM 50 MG/ML IJ SOLN
100.0000 mg | Freq: Three times a day (TID) | INTRAMUSCULAR | Status: DC
  Administered 2015-07-31: 100 mg via INTRAVENOUS
  Filled 2015-07-31 (×3): qty 2

## 2015-07-31 NOTE — Progress Notes (Signed)
[facility]  ROUNDING NOTE   Subjective:  Patient had hemodialysis yesterday. Became quite lethargic afterwards. Was moved over to the stepdown unit. Currently on BiPAP.  Objective:  Vital signs in last 24 hours:  Temp:  [98.2 F (36.8 C)-98.8 F (37.1 C)] 98.8 F (37.1 C) (12/27 2000) Pulse Rate:  [78-102] 82 (12/28 0500) Resp:  [7-25] 15 (12/28 0500) BP: (98-146)/(45-58) 136/56 mmHg (12/28 0500) SpO2:  [93 %-100 %] 100 % (12/28 0500) FiO2 (%):  [45 %] 45 % (12/28 0115) Weight:  [67.6 kg (149 lb 0.5 oz)-70.444 kg (155 lb 4.8 oz)] 67.6 kg (149 lb 0.5 oz) (12/27 2000)  Weight change: 0 kg (0 oz) Filed Weights   07/30/15 0500 07/30/15 0915 07/30/15 2000  Weight: 70.444 kg (155 lb 4.8 oz) 70.444 kg (155 lb 4.8 oz) 67.6 kg (149 lb 0.5 oz)    Intake/Output:     Intake/Output this shift:     Physical Exam: General: Critically ill appearing  Head:  atraumatic. bipap face mask on  Eyes: Anicteric  Neck: Supple, trachea midline  Lungs:   normal respiratory effort,  Clear anteriorly  Heart: S1S2 no rubs  Abdomen:  Soft, nontender, BS present   Extremities:  trace peripheral edema.  Neurologic: Lethargic difficult to arouse  Skin: No acute lesions  Access: PD catheter in place, RIJ permcath    Basic Metabolic Panel:  Recent Labs Lab 07/25/15 1326 07/25/15 1951 07/26/15 0716 07/28/15 1330 07/30/15 0416  NA 133*  --  140 142 140  K 2.7* 3.0* 3.3* 3.3* 4.3  CL 98*  --  104 104 105  CO2 28  --  31 32 28  GLUCOSE 104*  --  98 117* 96  BUN 19  --  10 20 24*  CREATININE 2.93*  --  1.96* 3.39* 2.67*  CALCIUM 8.5*  --  8.5* 8.9 8.7*  PHOS  --   --   --  3.7  --     Liver Function Tests:  Recent Labs Lab 07/28/15 1330  ALBUMIN 1.7*   No results for input(s): LIPASE, AMYLASE in the last 168 hours. No results for input(s): AMMONIA in the last 168 hours.  CBC:  Recent Labs Lab 07/25/15 1326 07/26/15 0716 07/28/15 1330 07/29/15 0547  WBC 6.8  6.6 7.4 8.6  HGB 11.0* 10.4* 10.1* 10.3*  HCT 34.8* 32.2* 31.6* 31.8*  MCV 94.7 93.0 92.9 92.7  PLT 161 145* 154 146*    Cardiac Enzymes: No results for input(s): CKTOTAL, CKMB, CKMBINDEX, TROPONINI in the last 168 hours.  BNP: Invalid input(s): POCBNP  CBG:  Recent Labs Lab 07/26/15 0010 07/26/15 0348 07/26/15 0723 07/26/15 1154 07/30/15 1947  GLUCAP 111* 95 87 113* 87    Microbiology: Results for orders placed or performed during the hospital encounter of 07/17/15  Blood culture (routine x 2)     Status: None   Collection Time: 07/17/15  4:02 AM  Result Value Ref Range Status   Specimen Description BLOOD RIGHT HAND  Final   Special Requests BOTTLES DRAWN AEROBIC AND ANAEROBIC  Final   Culture NO GROWTH 5 DAYS  Final   Report Status 07/22/2015 FINAL  Final  Blood culture (routine x 2)     Status: None   Collection Time: 07/17/15  4:02 AM  Result Value Ref Range Status   Specimen Description BLOOD LEFT ANTECUBITAL  Final   Special Requests BOTTLES DRAWN AEROBIC AND ANAEROBIC  Final   Culture  Setup Time  Final    GRAM POSITIVE COCCI IN CLUSTERS AEROBIC BOTTLE ONLY CRITICAL RESULT CALLED TO, READ BACK BY AND VERIFIED WITH: MARCELLA TURNER AT 2253 07/17/2015 BY TFK CONFIRMED BY MLZ    Culture   Final    COAGULASE NEGATIVE STAPHYLOCOCCUS AEROBIC BOTTLE ONLY Results consistent with contamination.    Report Status 07/22/2015 FINAL  Final  MRSA PCR Screening     Status: Abnormal   Collection Time: 07/17/15  6:51 AM  Result Value Ref Range Status   MRSA by PCR POSITIVE (A) NEGATIVE Final    Comment:        The GeneXpert MRSA Assay (FDA approved for NASAL specimens only), is one component of a comprehensive MRSA colonization surveillance program. It is not intended to diagnose MRSA infection nor to guide or monitor treatment for MRSA infections. CRITICAL RESULT CALLED TO, READ BACK BY AND VERIFIED WITH: Yehuda SavannahLauren Hobbs @ 07/16/16 by [facility]   Culture,  sputum-assessment     Status: None   Collection Time: 07/17/15  5:44 PM  Result Value Ref Range Status   Specimen Description SPUTUM  Final   Special Requests NONE  Final   Sputum evaluation   Final    Sputum specimen not acceptable for testing.  Please recollect.   CALLED TO MARCELLA TURNER @ 2009 ON 07/17/2015 BY CAF   Report Status 07/17/2015 FINAL  Final  Culture, blood (single)     Status: None   Collection Time: 07/18/15 10:02 AM  Result Value Ref Range Status   Specimen Description BLOOD LEFT HAND  Final   Special Requests BOTTLES DRAWN AEROBIC AND ANAEROBIC  2CC  Final   Culture NO GROWTH 5 DAYS  Final   Report Status 07/23/2015 FINAL  Final    Coagulation Studies: No results for input(s): LABPROT, INR in the last 72 hours.  Urinalysis: No results for input(s): COLORURINE, LABSPEC, PHURINE, GLUCOSEU, HGBUR, BILIRUBINUR, KETONESUR, PROTEINUR, UROBILINOGEN, NITRITE, LEUKOCYTESUR in the last 72 hours.  Invalid input(s): APPERANCEUR    Imaging: Ct Head Wo Contrast  07/30/2015  CLINICAL DATA:  Unresponsive. Patient arrived from Dialysis. VSS. Alert, and responding appropriately. Daughter arrived in room approximately 45 minutes later and called the RN to come assess the patient. Patient was up in chair, pale, and slump.*comment was truncated* EXAM: CT HEAD WITHOUT CONTRAST TECHNIQUE: Contiguous axial images were obtained from the base of the skull through the vertex without intravenous contrast. COMPARISON:  05/29/2015 FINDINGS: Sinuses/Soft tissues: Left maxillary sinus mucosal thickening is mild. Surgical changes of bilateral globes. Right worse than left mastoid effusions, chronic. Fluid in both middle ears again identified. Intracranial: Intracranial atherosclerosis. No mass lesion, hemorrhage, hydrocephalus, acute infarct, intra-axial, or extra-axial fluid collection. Gray-white differentiation is maintained. IMPRESSION: 1.  No acute intracranial abnormality. 2. Sinus disease. 3.  Chronic bilateral mastoid effusions with fluid in both middle ears. Electronically Signed   By: Jeronimo GreavesKyle  Talbot M.D.   On: 07/30/2015 18:13   Dg Chest Port 1 View  07/30/2015  CLINICAL DATA:  Aspiration pneumonia EXAM: PORTABLE CHEST 1 VIEW COMPARISON:  07/26/2015 FINDINGS: Left lower lobe consolidation present. Cardiomegaly stable with mitral valve replacement noted. Minimal blunting right costophrenic angle. Vascular pattern normal. IMPRESSION: There is improved aeration on the right when compared to the prior study, but there remains left lower lobe consolidation which could represent atelectasis or pneumonia/pneumonitis. Electronically Signed   By: Esperanza Heiraymond  Rubner M.D.   On: 07/30/2015 14:52     Medications:     . amLODipine  10 mg  Oral QPM  . antiseptic oral rinse  7 mL Mouth Rinse BID  . aspirin  81 mg Oral Daily  . carvedilol  12.5 mg Oral Q12H  . cloNIDine  0.1 mg Oral TID  . collagenase   Topical Daily  . cyanocobalamin  500 mcg Oral Daily  . docusate  100 mg Oral QODAY  . fentaNYL  25 mcg Transdermal Q72H  . fluticasone  2 spray Each Nare Daily  . furosemide  40 mg Oral Daily  . gentamicin cream  1 application Topical Daily  . heparin  5,000 Units Subcutaneous Q12H  . hydrALAZINE  50 mg Oral TID  . latanoprost  1 drop Both Eyes QHS  . lidocaine  1 patch Transdermal Q24H  . loratadine  10 mg Oral Daily  . mirtazapine  15 mg Oral QHS  . multivitamin-lutein  1 capsule Oral BID  . phenytoin  300 mg Oral QHS  . predniSONE  10 mg Oral Daily  . ascorbic acid  500 mg Oral Daily   acetaminophen, albuterol, hydrALAZINE, lactulose, morphine injection, nitroGLYCERIN, [DISCONTINUED] ondansetron **OR** ondansetron (ZOFRAN) IV  Assessment/ Plan:  81 y.o. female with past medical history of hypertension, obstructive sleep apnea, DJD, CVA with residual left sided weakness, carotid stenosis status post L CEA 2/15, seizure disorder, diastolic congestive heart failure, anemia Daughter:  Silvio Pate daughter - April  [facility] PD Delena Serve Cheree Ditto  1. End Stage Renal Disease on PD: Patient's daughter had started PD training as outpatient but got only 2 out of 8 days. Patient appears to continue to want aggressive care as she is aware that quitting dialysis would mean hospice and death in near future.  Plan: Patient completed dialysis yesterday. Chest x-ray from yesterday reviewed. No pulmonary edema noted. There is left lower lobe pneumonia however. No acute indication for dialysis today.  2. Hypertension: Allergy to enalapril (causes anaphylaxis). Blood pressures now at goal.  - Blood pressure currently 132/53. Continue amlodipine, hydralazine, carvedilol.   3. Secondary hyperparathyroidism. Continue to periodically monitor bone mineral metabolism parameters.  4. Anemia chronic kidney disease:   - Hemoglobin 10.3 and acceptable.  5.  Bacterial pneumonia/respiratory failure:  Suspect aspiration.  Has been treated with antibodies. Left lower lobe pneumonia persistent but this is likely to be the case radiographically.  6. Hypokalemia - Potassium 4.3 yesterday.     LOS: 14 Tavin Vernet 12/28/20167:21 AM

## 2015-07-31 NOTE — Care Management (Signed)
Patient transferred back to icu due to the need fotr continued bipapp

## 2015-07-31 NOTE — Progress Notes (Signed)
Pt left via EMS to go home with hospice. Discharge paperwork and teaching given to pt daughter. Pt still lethargic. All belongings left with pt family. Hospice notified of discharge. Pt stable at time of discharge.

## 2015-07-31 NOTE — Progress Notes (Signed)
Notified by Kristina Wilson Kristina Wilson of patient and family choice to return home with hospice services. Writer met with patient's daughter Kristina Wilson and granddaughter Kristina Wilson, services explained. Kristina Wilson stated that she "wants mom comfortable and pain free". Writer spoke with attending Dr. Vianne Wilson regarding discharge medications for symptom management at home. Prescriptions for liquid morphine concentrate 20/ml, dose 0.25 ml/ 5 mg Q 2 hours PRN and lorazepam 0.88m Q 4 hrs for anxiety written by Dr. KVianne Bullsand given to patient's granddaughter Kristina Wilson to be taken to the pharmacy. [facility]. Kristina Wilson to continue the patient's dilantin due to her history of seizures and plans to give it rectally, Kristina Wilson aware. Patient appeared restless and agitated through out visit,  family also requested IV morphine be given for pain, Staff RN Kristina Wilson aware. Kristina Billingshas requested a suction machine be delivered to the home, contact for delivery is patient's son Kristina Wilson([phone]  patient already has a Wilson bed and oxygen in place in the home. Updated information faxed to referral intake. Plan is for discharge home as soon as possible via EMS. Signed DNR in place on patient's chart. Kristina Wilson, Kristina Wilson, Kristina Wilson [facility] Kristina WilsonKristina Wilson

## 2015-07-31 NOTE — Discharge Summary (Signed)
[facility] [place] [facility]   PATIENT NAME: Kristina GoltzJacquelyn Ferner    MR#:  [id number]  DATE OF BIRTH:  January 30, 1923  DATE OF ADMISSION:  07/17/2015 ADMITTING PHYSICIAN: Crissie FiguresEdavally N Reddy, MD  DATE OF DISCHARGE: 07/30/15  PRIMARY CARE PHYSICIAN: Jaclyn ShaggyATE,DENNY C, MD    ADMISSION DIAGNOSIS:  Aspiration pneumonia of both lower lobes, unspecified aspiration pneumonia type (HCC) [J69.0]  DISCHARGE DIAGNOSIS:  Principal Problem:   Aspiration pneumonia (HCC) Active Problems:   CKD (chronic kidney disease), stage IV (HCC)   Pressure ulcer   Chronic respiratory failure (HCC)   HTN (hypertension), benign   Pneumonia   SECONDARY DIAGNOSIS:   Past Medical History  Diagnosis Date  . Muscle weakness   . Chronic respiratory failure (HCC)   . Pneumonia   . Sleep apnea   . CHF (congestive heart failure) (HCC)   . CKD (chronic kidney disease), stage IV (HCC)   . Dysphagia   . GERD (gastroesophageal reflux disease)   . Back pain   . Epilepsy (HCC)   . Hypertension   . Stroke (HCC)   . Headache   . Arthritis   . Anemia     iron deficiency anemia  . Complication of anesthesia     difficult waking up after surgery (2013)    HOSPITAL COURSE:   Principal Problem:  Aspiration pneumonia (HCC) Active Problems:  CKD (chronic kidney disease), stage IV (HCC)  Pressure ulcer  Chronic respiratory failure (HCC)  HTN (hypertension), benign  * aspiration Vs HCAP pneumonia- causing acute on chronic respiratory distress.  Associated sepsis - present on admission- with altered mental status. von zosyn-  On oxygen, continue. Now weaned to 3L nasal cannula - CXR with pulm vascular congestion- Improved breathing with dialysis - patient also has chronic respiratory failure due to underlying COPD with frequent hypercarbia - family  decided to take the patient home with home hospice. Discharge home with oxygen, Roxanol, Ativan. Did not have any other medications.  And patient is made comfort care and discharge home with hospice. * Dysphagia- poor PO intake continues. Daughter aware - constant secretions and has a suction catheter beside her at all times - poor intake. -had MBSS, on nectar thick liquids and dysphagia diet  * ESRD  on PD- nephro consult.   Family chose no dialysis at home. Patient's daughter wanted her to be comfortable so we'disischarged her home with hospice. - * Htn received Coreg, clonidine and Lasix Also  oral hydralazine.  no Blood pressure medications at discharge as t. Discharge home with hospice. * CVA Hx  Bedbound- is total care.  Family do not want any rehabilitation. They want to take her home.  * Hx of seizure disorder  discharged home with Dilantin  To give rectally.   Overall poor prognosis. Appreciate palliative care input   PLAN is to discharge home with daughter and niece caring for her-    Discharge home with home hospice. Wrote prescription for Roxanol, Ativan. And also Dilantin rectally or seizures.  DISCHARGE CONDITIONS:   Guarded  CONSULTS OBTAINED:   Nephrology consult by Dr. Thedore MinsSingh Palliative care consult by Dr. Orvan Falconerampbell Pulmonary consult by Dr. Dema SeverinMungal  DRUG ALLERGIES:   Allergies  Allergen Reactions  . Enalapril Anaphylaxis  . Tramadol Shortness Of Breath  . Capsaicin Other (See Comments)    Reaction:  Unknown   . Codeine Other (See Comments)    Reaction:  Unknown   . Hydrocodone Other (See Comments)    Reaction:  Unknown   . Vicodin [Hydrocodone-Acetaminophen] Other (See Comments)    Reaction:  Unknown     DISCHARGE MEDICATIONS:   Current Discharge Medication List    START taking these medications   Details  albuterol (PROVENTIL) (2.5 MG/3ML) 0.083% nebulizer solution Take 3 mLs (2.5 mg total) by nebulization every 2 (two) hours as needed for wheezing. Qty: 75 mL, Refills: 12    LORazepam (ATIVAN) 0.5 MG tablet Take 1 tablet (0.5 mg total) by mouth every 4 (four) hours  as needed for anxiety. Qty: 30 tablet, Refills: 0    Morphine Sulfate (MORPHINE CONCENTRATE) 10 MG/0.5ML SOLN concentrated solution Take 0.25 mLs (5 mg total) by mouth every 2 (two) hours as needed for severe pain. Qty: 30 mL, Refills: 0    ondansetron (ZOFRAN) 4 MG tablet Take 1 tablet (4 mg total) by mouth every 8 (eight) hours as needed for nausea or vomiting. Qty: 20 tablet, Refills: 0      CONTINUE these medications which have CHANGED   Details  phenytoin (DILANTIN) 100 MG ER capsule Take 3 capsules (300 mg total) by mouth 3 (three) times daily. Qty: 60 capsule, Refills: 0      STOP taking these medications     acetaminophen (TYLENOL) 325 MG tablet      amLODipine (NORVASC) 10 MG tablet      ascorbic acid (VITAMIN C) 500 MG tablet      aspirin EC 81 MG tablet      bimatoprost (LUMIGAN) 0.03 % ophthalmic solution      carvedilol (COREG) 12.5 MG tablet      cetirizine (ZYRTEC) 10 MG tablet      cloNIDine (CATAPRES) 0.1 MG tablet      Coenzyme Q10 (COQ10) 50 MG CAPS      cyanocobalamin 500 MCG tablet      docusate sodium (COLACE) 100 MG capsule      fentaNYL (DURAGESIC - DOSED MCG/HR) 25 MCG/HR patch      fluticasone (FLONASE) 50 MCG/ACT nasal spray      furosemide (LASIX) 40 MG tablet      gentamicin ointment (GARAMYCIN) 0.1 %      hydrALAZINE (APRESOLINE) 100 MG tablet      lactulose (CHRONULAC) 10 GM/15ML solution      lidocaine (LIDODERM) 5 %      mirtazapine (REMERON) 15 MG tablet      Multiple Vitamins-Minerals (PRESERVISION AREDS PO)      nitroGLYCERIN (NITROSTAT) 0.4 MG SL tablet      oxycodone (OXY-IR) 5 MG capsule      pantoprazole (PROTONIX) 40 MG tablet      predniSONE (DELTASONE) 10 MG tablet          DISCHARGE INSTRUCTIONS:    Discharge  home with hospice.    Today   CHIEF COMPLAINT:   Chief Complaint  Patient presents with  . Shortness of Breath    VITAL SIGNS:  Blood pressure 132/55, pulse 92, temperature 98.4 F  (36.9 C), temperature source Axillary, resp. rate 24, height 5\' 6"  (1.676 m), weight 67.6 kg (149 lb 0.5 oz), SpO2 95 %.  I/O:    Intake/Output Summary (Last 24 hours) at 07/31/15 1525 Last data filed at 07/31/15 0800  Gross per 24 hour  Intake      0 ml  Output      0 ml  Net      0 ml    PHYSICAL EXAMINATION:   Physical Exam  GENERAL: 81 y.o.-year-old patient lying in  the bed ,gurgling  EYES: Pupils equal, round, reactive to light and accommodation. No scleral icterus. Extraocular muscles intact.  HEENT: Does have distention. No thyroid enlargement, no tenderness.  LUNGS: Normal breath sounds bilaterally, decreased bibasilar breath sounds, rhonchi heard diffusely, no wheezing. No use of accessory muscles of respiration.  CARDIOVASCULAR: S1, S2 normal. No rubs, or gallops. 3/6 systolic murmur present ABDOMEN: Soft, nontender, nondistended. Bowel sounds present. No organomegaly or mass.  EXTREMITIES: No cyanosis, or clubbing. 1+ pedal edema present Complains of tenderness when her legs are being examined NEUROLOGIC: disoriented,not following commands'  PSYCHIATRIC;disoriented,not following commands SKIN: No obvious rash, lesion, or ulcer.   DATA REVIEW:   CBC  Recent Labs Lab 07/29/15 0547  WBC 8.6  HGB 10.3*  HCT 31.8*  PLT 146*    Chemistries   Recent Labs Lab 07/30/15 0416  NA 140  K 4.3  CL 105  CO2 28  GLUCOSE 96  BUN 24*  CREATININE 2.67*  CALCIUM 8.7*    Cardiac Enzymes No results for input(s): TROPONINI in the last 168 hours.  Microbiology Results  Results for orders placed or performed during the hospital encounter of 07/17/15  Blood culture (routine x 2)     Status: None   Collection Time: 07/17/15  4:02 AM  Result Value Ref Range Status   Specimen Description BLOOD RIGHT HAND  Final   Special Requests BOTTLES DRAWN AEROBIC AND ANAEROBIC  Final   Culture NO GROWTH 5 DAYS  Final   Report Status 07/22/2015 FINAL  Final  Blood  culture (routine x 2)     Status: None   Collection Time: 07/17/15  4:02 AM  Result Value Ref Range Status   Specimen Description BLOOD LEFT ANTECUBITAL  Final   Special Requests BOTTLES DRAWN AEROBIC AND ANAEROBIC  Final   Culture  Setup Time   Final    GRAM POSITIVE COCCI IN CLUSTERS AEROBIC BOTTLE ONLY CRITICAL RESULT CALLED TO, READ BACK BY AND VERIFIED WITH: MARCELLA TURNER AT 2253 07/17/2015 BY TFK CONFIRMED BY MLZ    Culture   Final    COAGULASE NEGATIVE STAPHYLOCOCCUS AEROBIC BOTTLE ONLY Results consistent with contamination.    Report Status 07/22/2015 FINAL  Final  MRSA PCR Screening     Status: Abnormal   Collection Time: 07/17/15  6:51 AM  Result Value Ref Range Status   MRSA by PCR POSITIVE (A) NEGATIVE Final    Comment:        The GeneXpert MRSA Assay (FDA approved for NASAL specimens only), is one component of a comprehensive MRSA colonization surveillance program. It is not intended to diagnose MRSA infection nor to guide or monitor treatment for MRSA infections. CRITICAL RESULT CALLED TO, READ BACK BY AND VERIFIED WITH: Yehuda Savannah @ 07/16/16 by [facility]   Culture, sputum-assessment     Status: None   Collection Time: 07/17/15  5:44 PM  Result Value Ref Range Status   Specimen Description SPUTUM  Final   Special Requests NONE  Final   Sputum evaluation   Final    Sputum specimen not acceptable for testing.  Please recollect.   CALLED TO MARCELLA TURNER @ 2009 ON 07/17/2015 BY CAF   Report Status 07/17/2015 FINAL  Final  Culture, blood (single)     Status: None   Collection Time: 07/18/15 10:02 AM  Result Value Ref Range Status   Specimen Description BLOOD LEFT HAND  Final   Special Requests BOTTLES DRAWN AEROBIC AND ANAEROBIC  2CC  Final  Culture NO GROWTH 5 DAYS  Final   Report Status 07/23/2015 FINAL  Final    RADIOLOGY:  Ct Head Wo Contrast  07/30/2015  CLINICAL DATA:  Unresponsive. Patient arrived from Dialysis. VSS. Alert, and responding  appropriately. Daughter arrived in room approximately 45 minutes later and called the RN to come assess the patient. Patient was up in chair, pale, and slump.*comment was truncated* EXAM: CT HEAD WITHOUT CONTRAST TECHNIQUE: Contiguous axial images were obtained from the base of the skull through the vertex without intravenous contrast. COMPARISON:  05/29/2015 FINDINGS: Sinuses/Soft tissues: Left maxillary sinus mucosal thickening is mild. Surgical changes of bilateral globes. Right worse than left mastoid effusions, chronic. Fluid in both middle ears again identified. Intracranial: Intracranial atherosclerosis. No mass lesion, hemorrhage, hydrocephalus, acute infarct, intra-axial, or extra-axial fluid collection. Gray-white differentiation is maintained. IMPRESSION: 1.  No acute intracranial abnormality. 2. Sinus disease. 3. Chronic bilateral mastoid effusions with fluid in both middle ears. Electronically Signed   By: Jeronimo Greaves M.D.   On: 07/30/2015 18:13   Dg Chest Port 1 View  07/30/2015  CLINICAL DATA:  Aspiration pneumonia EXAM: PORTABLE CHEST 1 VIEW COMPARISON:  07/26/2015 FINDINGS: Left lower lobe consolidation present. Cardiomegaly stable with mitral valve replacement noted. Minimal blunting right costophrenic angle. Vascular pattern normal. IMPRESSION: There is improved aeration on the right when compared to the prior study, but there remains left lower lobe consolidation which could represent atelectasis or pneumonia/pneumonitis. Electronically Signed   By: Esperanza Heir M.D.   On: 07/30/2015 14:52    EKG:   Orders placed or performed during the hospital encounter of 08/09/15  . ED EKG  . ED EKG  . EKG 12-Lead  . EKG 12-Lead      Management plans discussed with the patient, family and they are in agreement.  CODE STATUS:     Code Status Orders        Start     Ordered   2015-08-09 [phone]  Do not attempt resuscitation (DNR)   Continuous    Question Answer Comment  In the event  of cardiac or respiratory ARREST Do not call a "code blue"   In the event of cardiac or respiratory ARREST Do not perform Intubation, CPR, defibrillation or ACLS   In the event of cardiac or respiratory ARREST Use medication by any route, position, wound care, and other measures to relive pain and suffering. May use oxygen, suction and manual treatment of airway obstruction as needed for comfort.   Comments RN may pronounce death      08/09/15 9604    Advance Directive Documentation        Most Recent Value   Type of Advance Directive  Out of facility DNR (pink MOST or yellow form)   Pre-existing out of facility DNR order (yellow form or pink MOST form)     "MOST" Form in Place?        TOTAL TIME TAKING CARE OF THIS PATIENT: 40 minutes.    Katha Hamming M.D on 07/31/2015 at 3:25 PM  Between 7am to 6pm - Pager - [phone]  After 6pm go to [email or web address] - password [email or web address] [facility]  [facility]  Office  [phone]  CC: Primary care physician; Jaclyn Shaggy, MD

## 2015-07-31 NOTE — Progress Notes (Signed)
Family concerned because patient hasn't received meds.  Was in HD then too lethargic to tolerate PO meds.  [facility] notified of family concerns.

## 2015-07-31 NOTE — Consult Note (Signed)
After further assessment, I had discussion with Daughter HCPOA, patient on intermittent biPAP and patient has conveyed to daughter that she does not want any more care including BiPAP and PD.    Will transition to Home with Hospice

## 2015-07-31 NOTE — Progress Notes (Signed)
Pt is still very lethargic this morning. PO meds held this morning due to patient condition. Dr. Luberta MutterKonidena called and updated. Dilatin medication changed to IV.   New ABG ordered.  Will continue to assess.

## 2015-07-31 NOTE — Progress Notes (Signed)
Pt is fighting me while I place the Bipap mask. Pt is pulling mask and saying "No". I explained to pt that she must wear the mask vshe continues to pull at mask and shake her head

## 2015-07-31 NOTE — Progress Notes (Signed)
   07/31/15 1100  Clinical Encounter Type  Visited With Patient and family together;Health care provider  Visit Type Initial;Spiritual support  Referral From Nurse  Consult/Referral To Chaplain  Spiritual Encounters  Spiritual Needs Emotional;Grief support  Stress Factors  Patient Stress Factors Health changes  Family Stress Factors Health changes;Loss of control  Chaplain rounded in the unit and offered a compassionate presence as the daughter was processing patient's wishes.  Chaplain Yehoshua Vitelli A. Carmell [place] Ext. [phone]

## 2015-07-31 NOTE — Care Management (Signed)
Patient acknowledges that she does not want to continue dialysis and wants to go home under comfort care.  Patient shook head no  when asked if she wanted to continue dialysis and shook head yes when asked if she wants to go home.  She shakes her head yes when asked if she wants to go home to die.  Servando SnareKaren R with [facility] aware.  Patient to transport by [facility].

## 2015-08-04 DEATH — deceased

## 2015-10-28 IMAGING — CR DG CHEST 1V PORT
1 series · 1 of 1 positions shown · non-contrast
Comparison: 03/03/2015

CLINICAL DATA: 2 hr history of weakness.

EXAM:
PORTABLE CHEST - 1 VIEW

[ap]
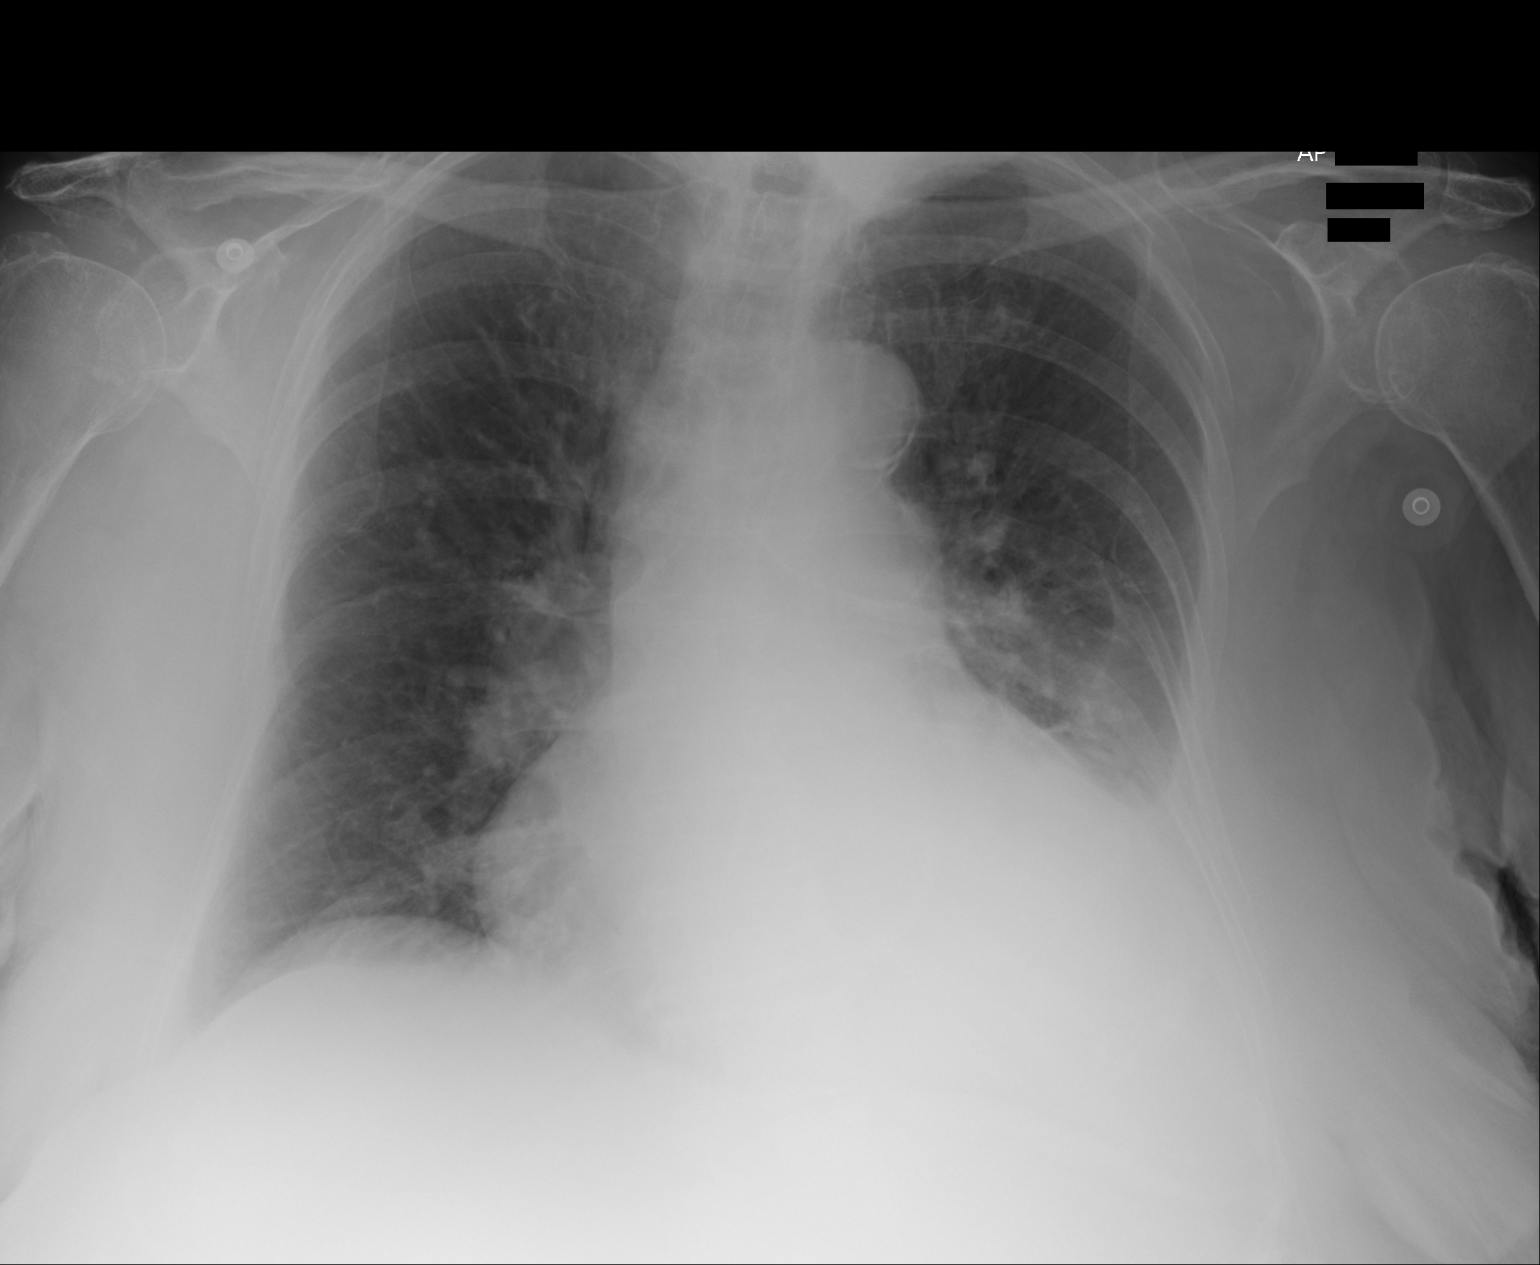

[1 of 1 positions shown; findings below may reference images not displayed]

FINDINGS: The heart is mildly enlarged but stable. There is tortuosity and
calcification of the thoracic aorta. Persistent left lower lobe
process appears to be a combination of effusion, atelectasis and or
infiltrate. The bony thorax is intact.
IMPRESSION: Stable cardiac enlargement and persist left lower lobe process.

## 2015-10-28 IMAGING — CR DG LUMBAR SPINE 2-3V
1 series · 3 of 3 positions shown · non-contrast
Comparison: Lumbar spine radiographs 11/03/2014.

CLINICAL DATA: Sudden onset of weakness in severe pain in the back.

EXAM:
LUMBAR SPINE - 2-3 VIEW

[Series 1: dg lumbar spine 2-3 views · 0.14mm/px · 3 of 3 slices shown]
[im 1/3]
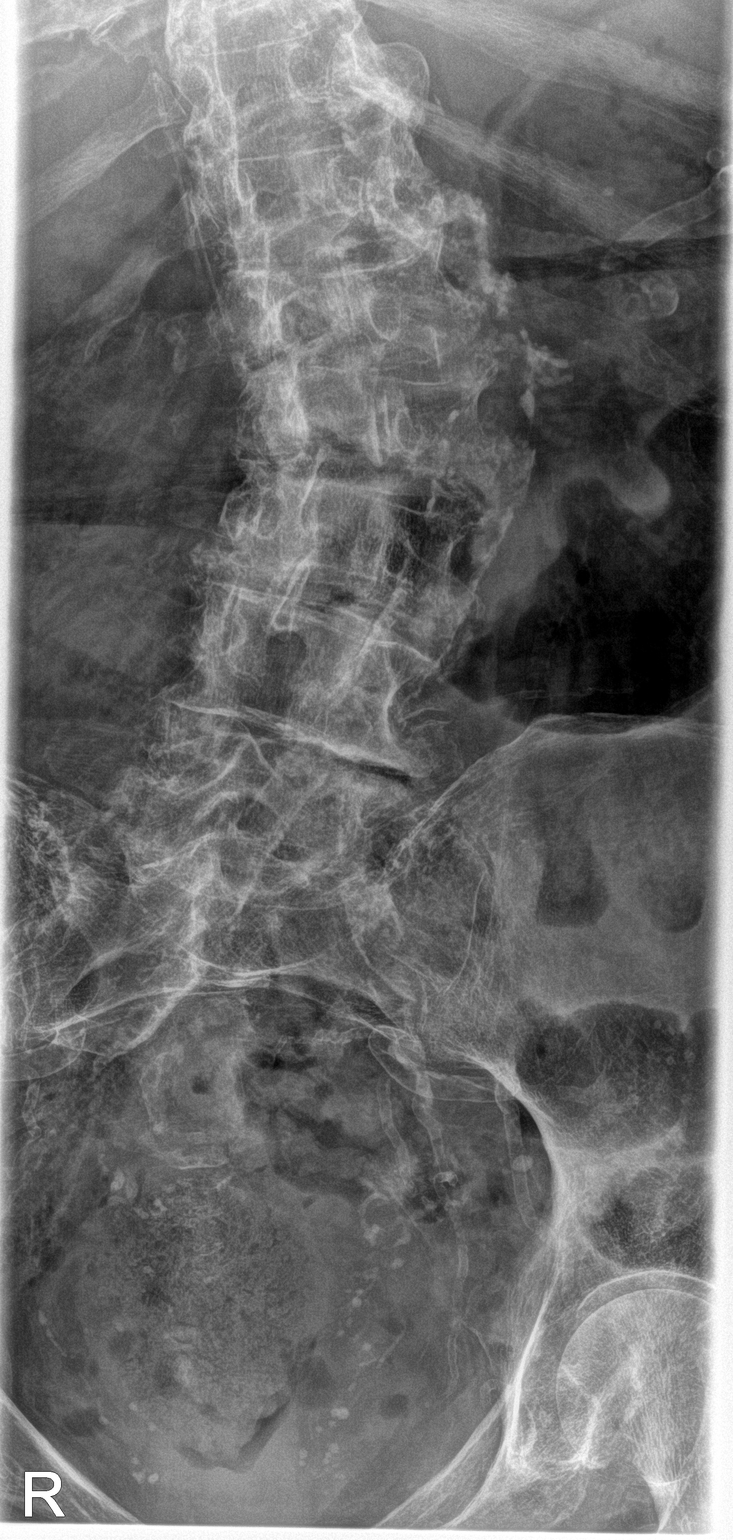
[im 2/3]
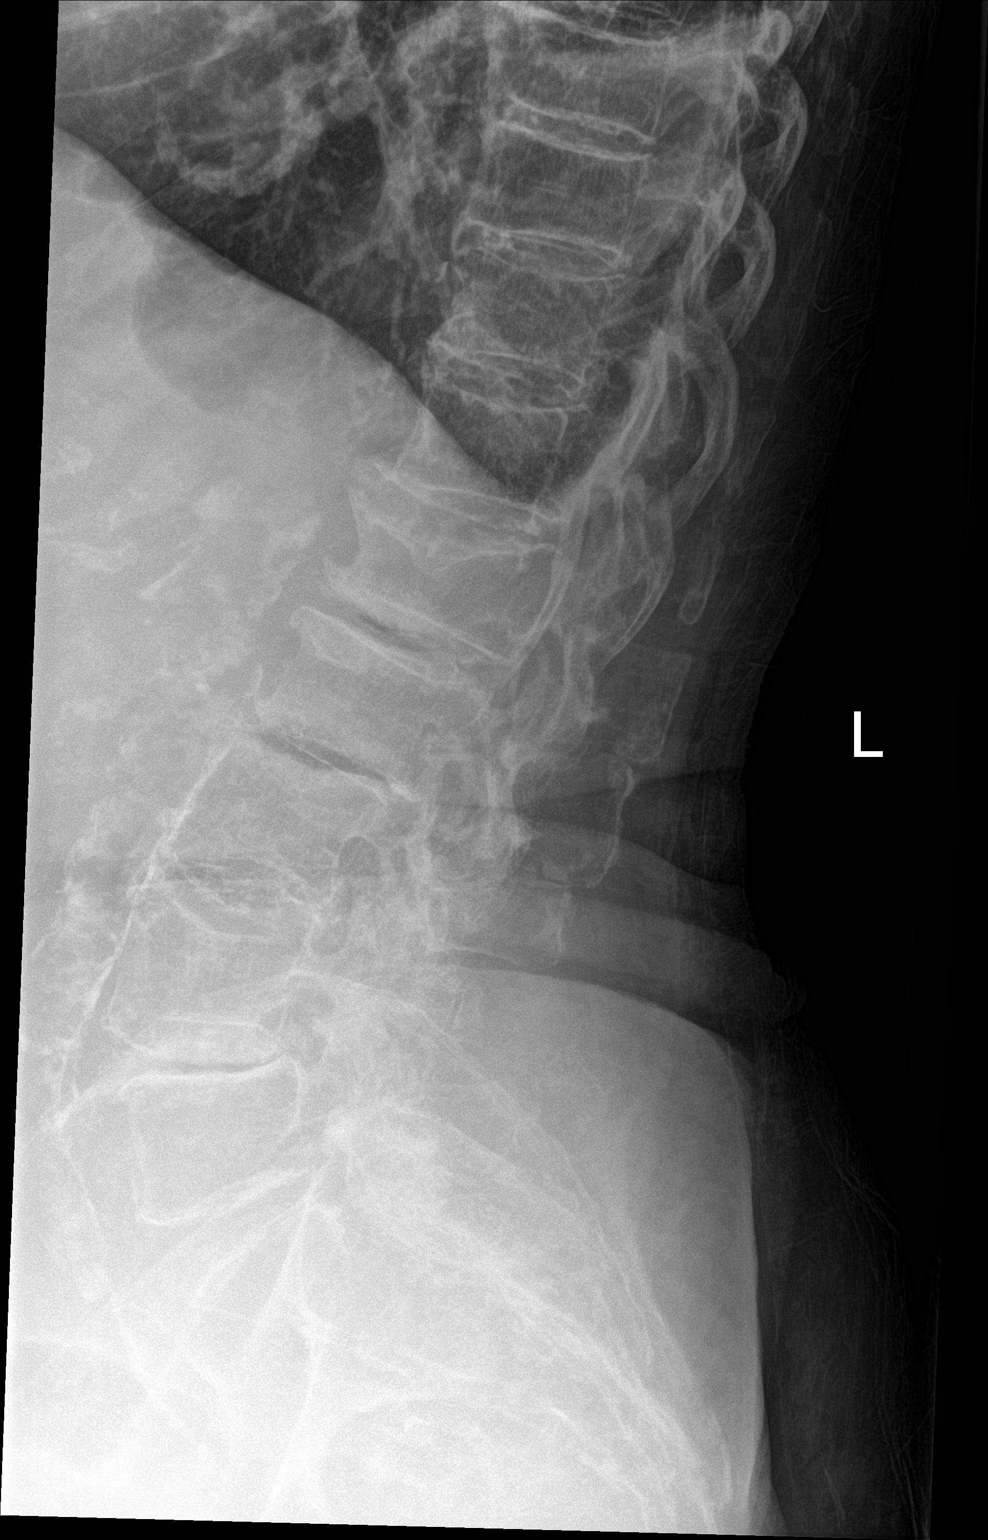
[im 3/3]
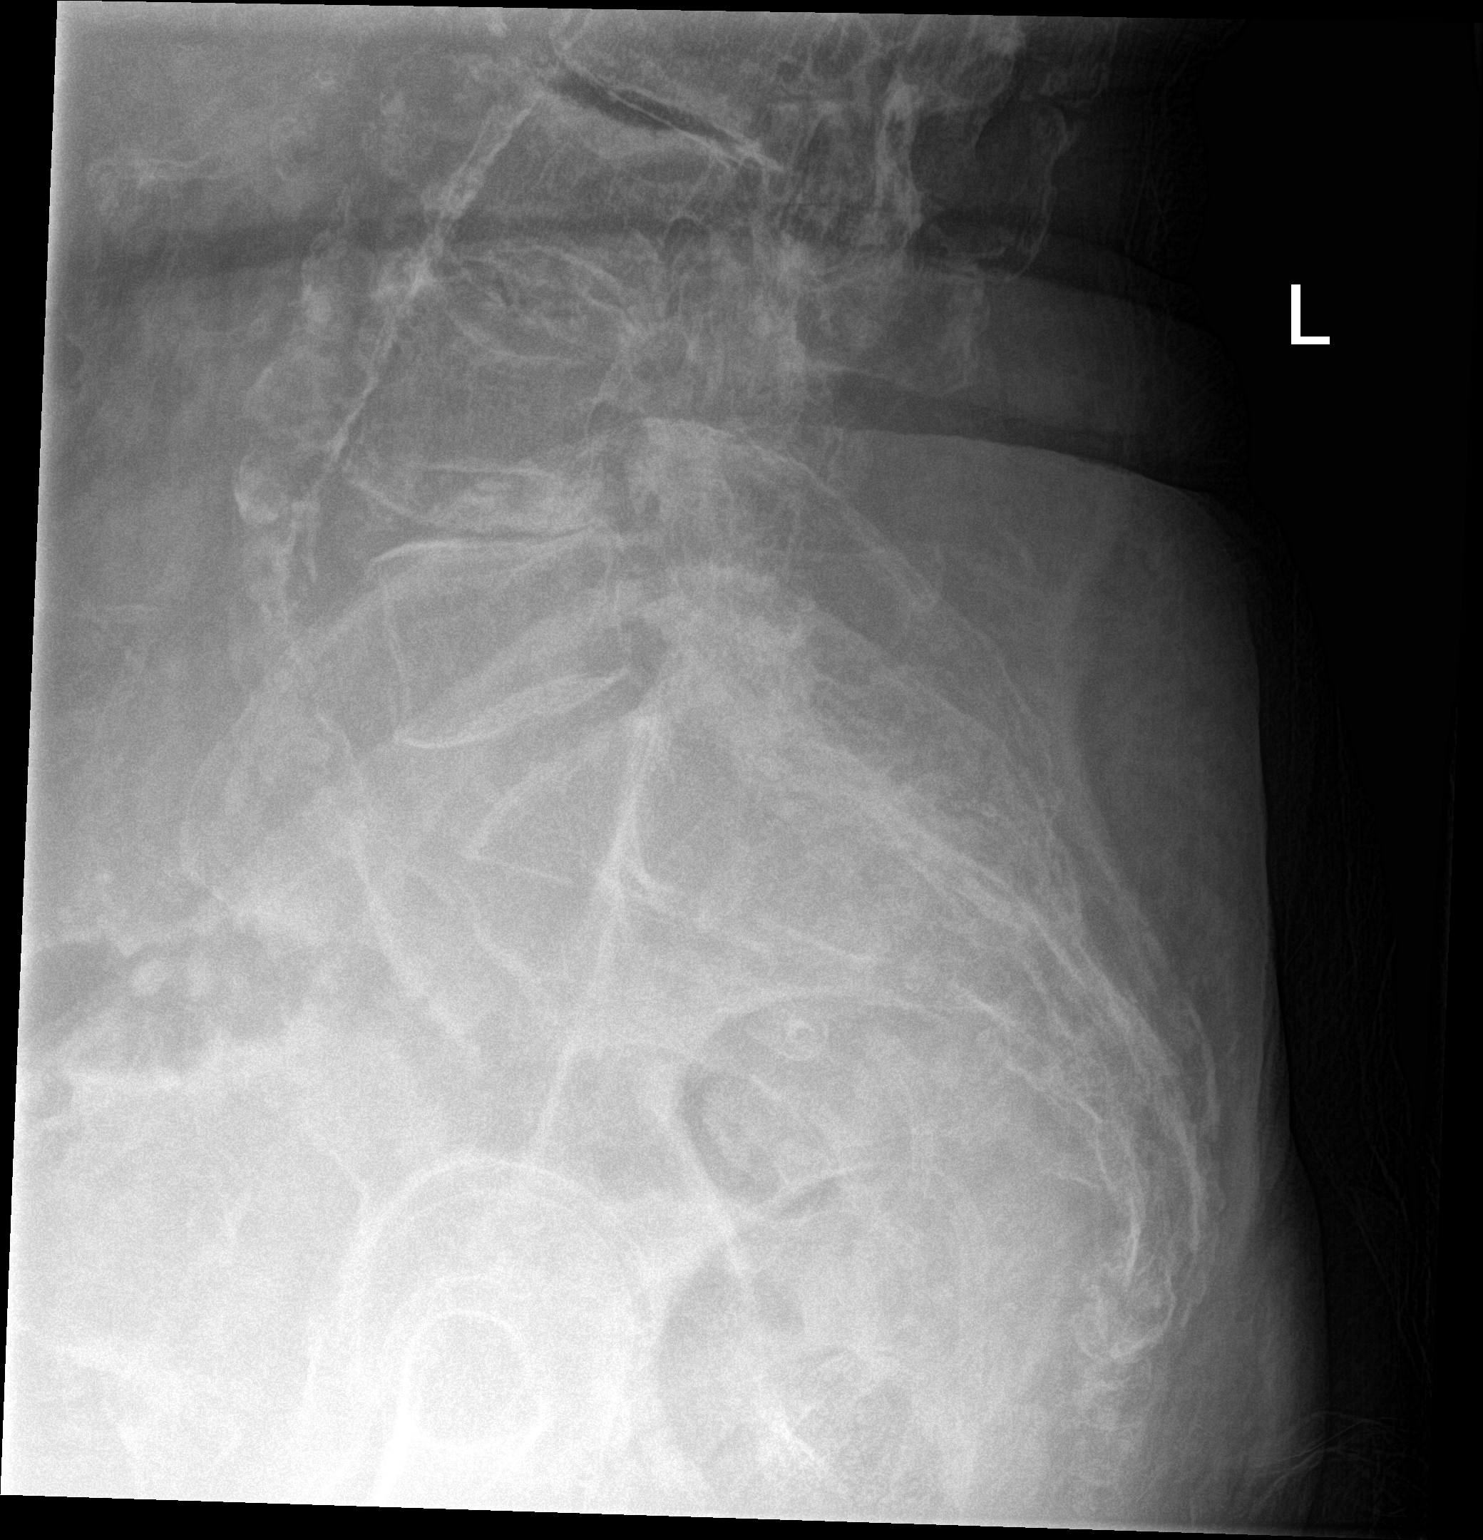

[3 of 3 positions shown; findings below may reference images not displayed]

FINDINGS: Five non rib-bearing lumbar type vertebral bodies are present.
Moderate osteopenia is again seen. A remote superior endplate
fracture is present at L1, L2, and L4 without significant change. No
acute fractures are present. Atherosclerotic calcifications are
again seen within the aorta and branch vessels.
IMPRESSION: 1. No acute abnormality or significant interval change.
2. Remote lumbar compression fractures at L1, L2, and L4 are stable.

## 2015-10-28 IMAGING — CT CT HEAD W/O CM
2 series · 16 of 30 positions shown, 20 images · non-contrast
Comparison: 12/07/2014

CLINICAL DATA: PICC line and functional capabilities over past
24-48 hours, now unable to walk or care for self, weakness, history
CHF, GERD, TIA, former smoker

EXAM:
CT HEAD WITHOUT CONTRAST
TECHNIQUE: Contiguous axial images were obtained from the base of the skull
through the vertex without intravenous contrast.

[Series 2: soft tissue · axial · 0.42mm/px · z∈[-200,-60]mm · 13 of 34 slices shown, 17 images]
[im 3/34  brain]
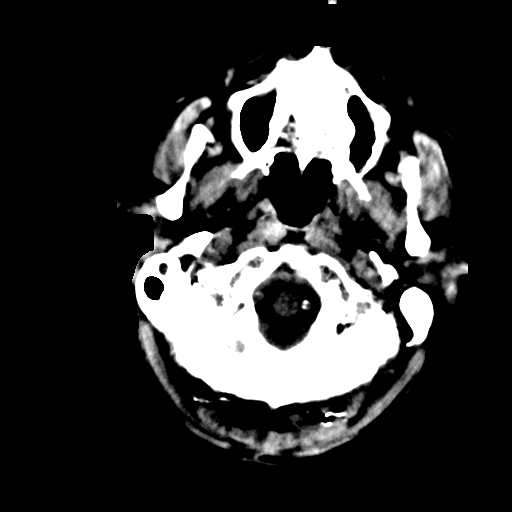
[im 3/34  bone]
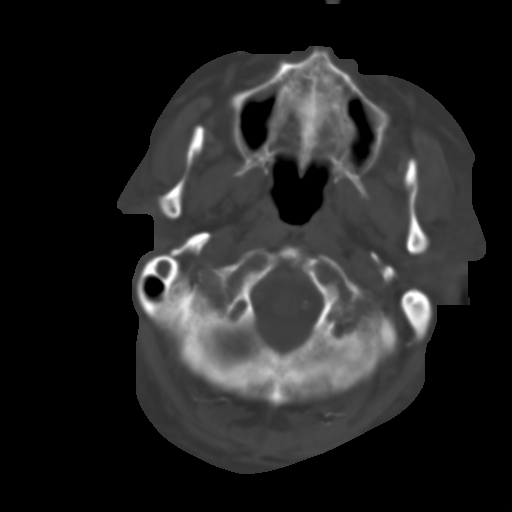
[im 5/34  brain]
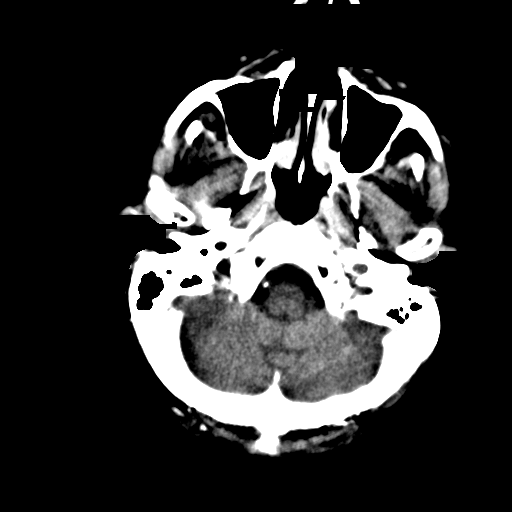
[im 8/34  brain]
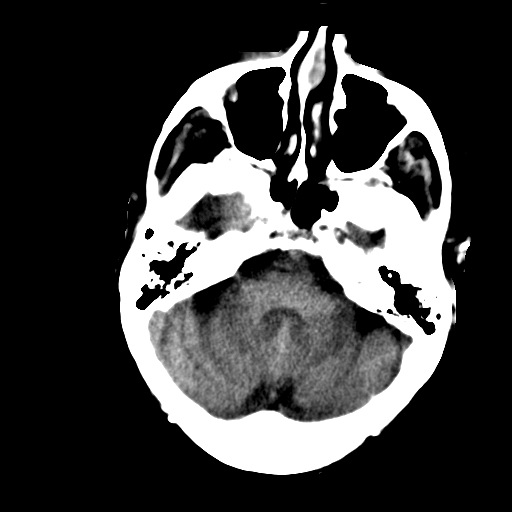
[im 10/34  brain]
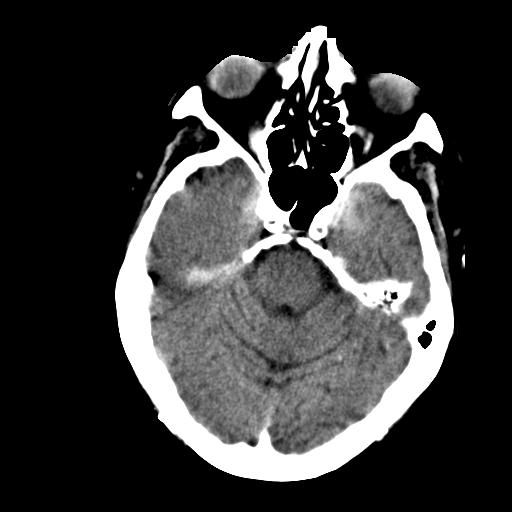
[im 12/34  brain]
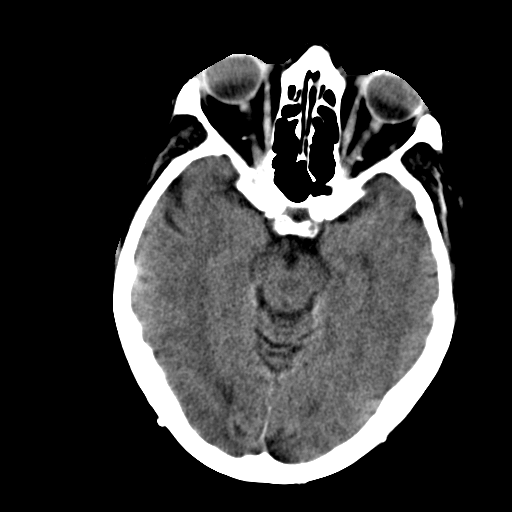
[im 12/34  bone]
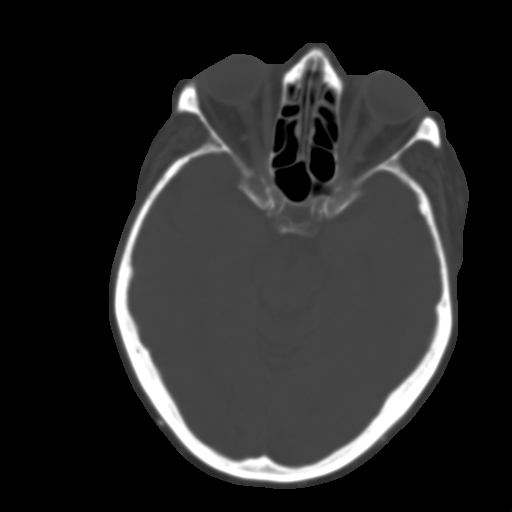
[im 15/34  brain]
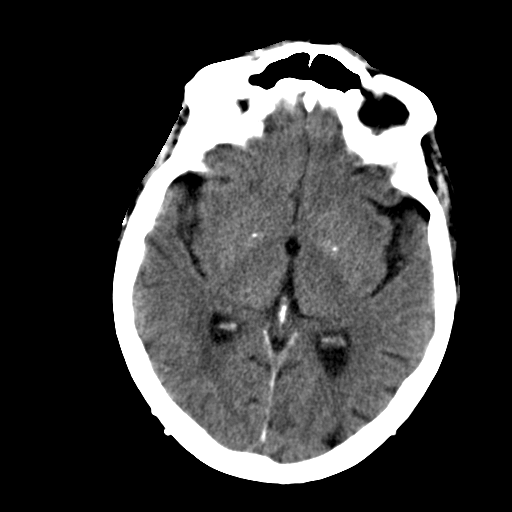
[im 17/34  brain]
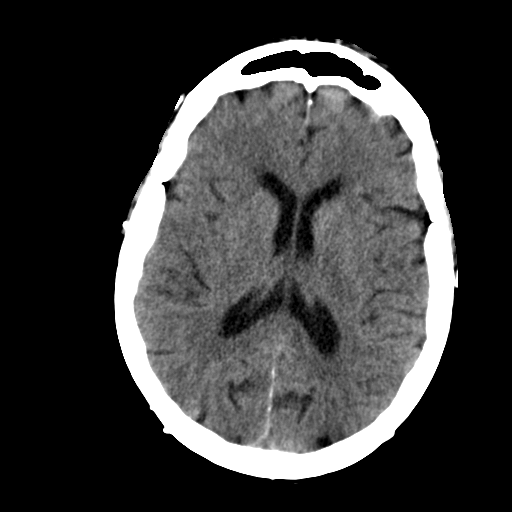
[im 19/34  brain]
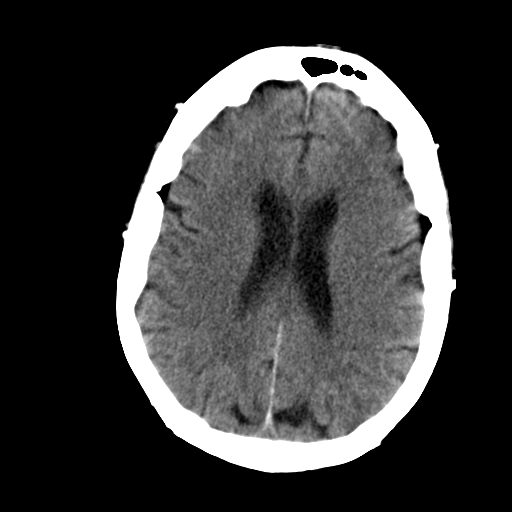
[im 22/34  brain]
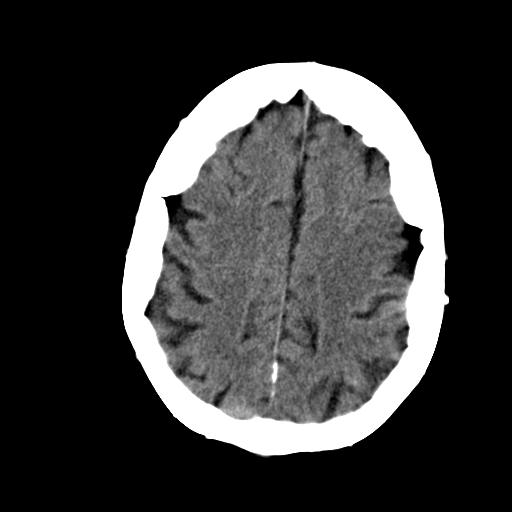
[im 22/34  bone]
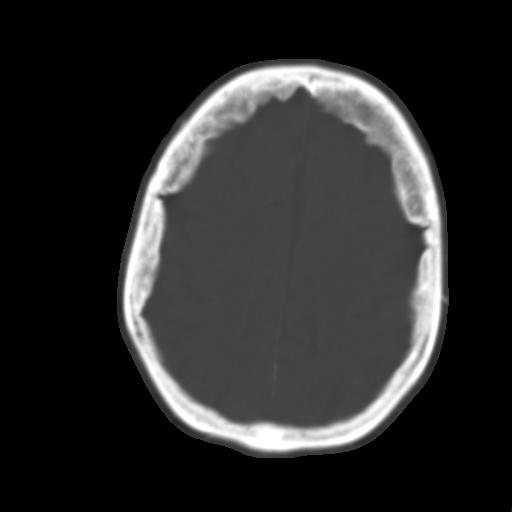
[im 24/34  brain]
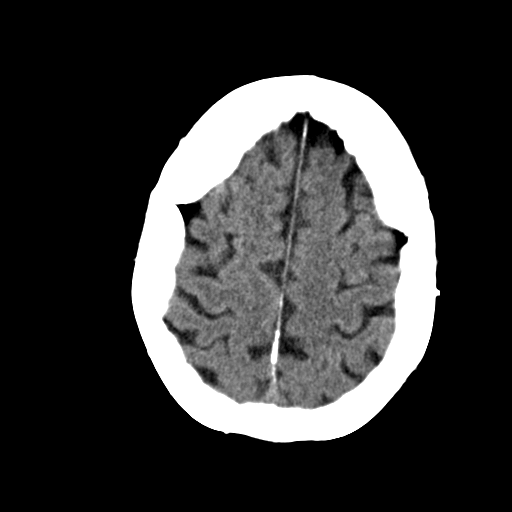
[im 26/34  brain]
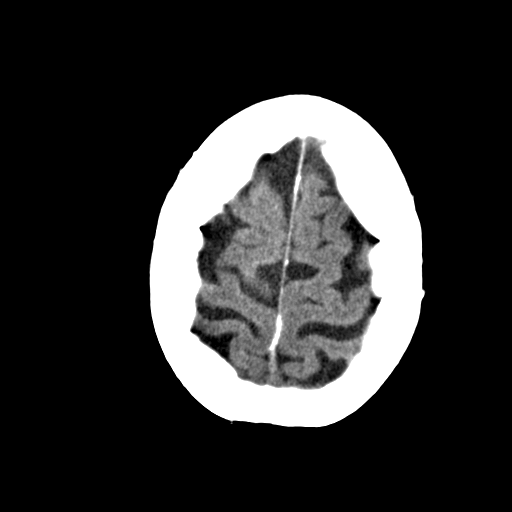
[im 29/34  brain]
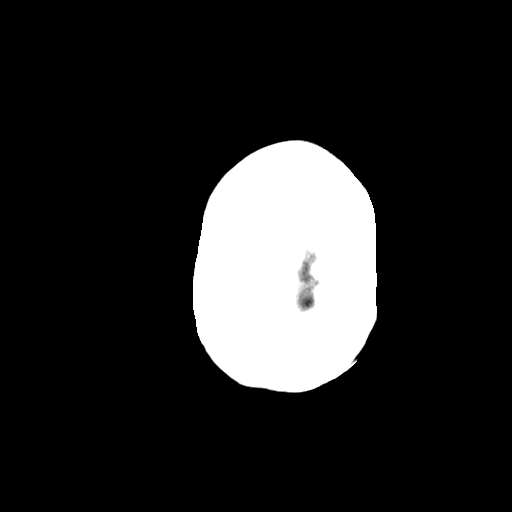
[im 31/34  brain]
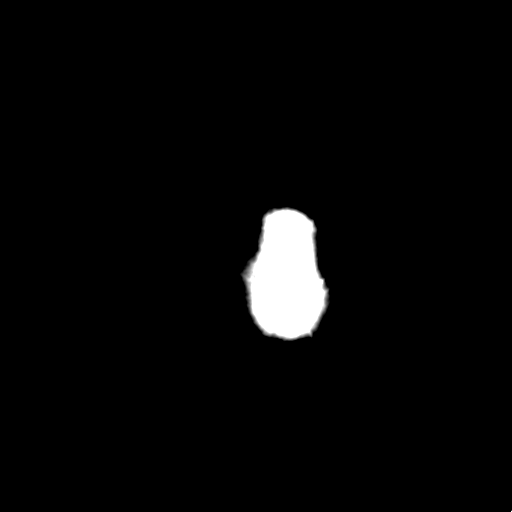
[im 31/34  bone]
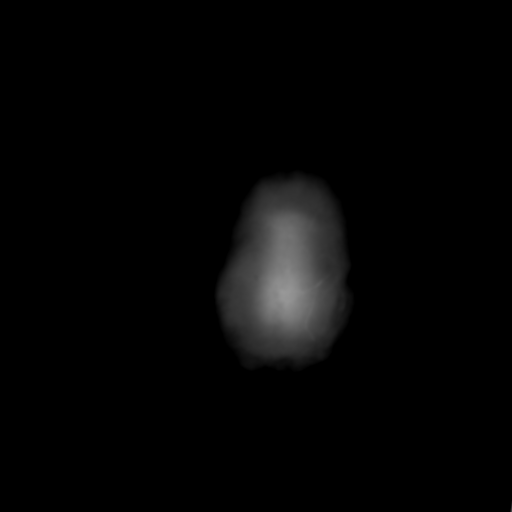

[Series 3: bone · axial · 0.42mm/px · z∈[-200,-155]mm · 3 of 33 slices shown]
[im 3/33  bone]
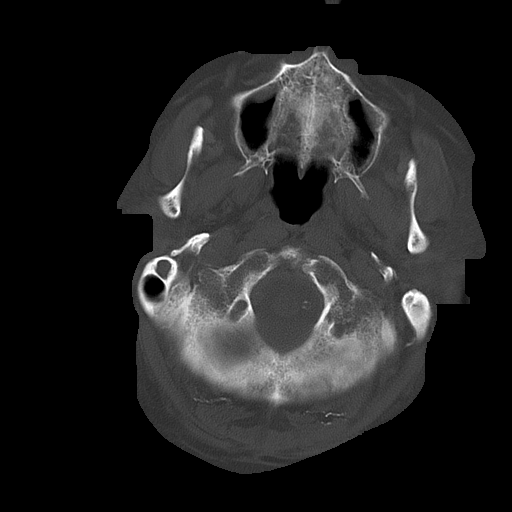
[im 7/33  bone]
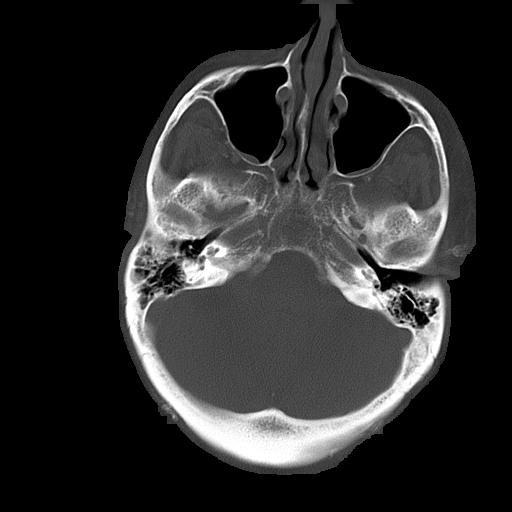
[im 12/33  bone]
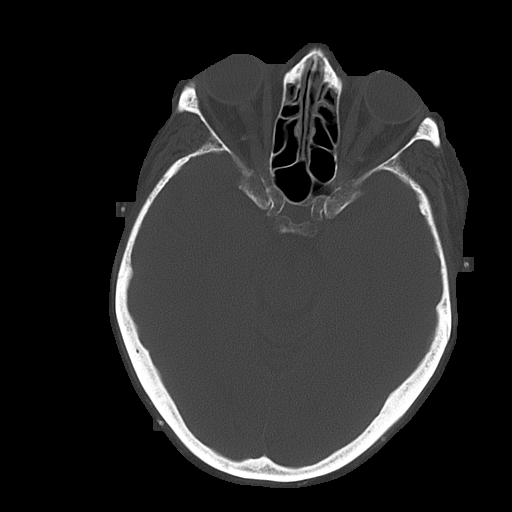

[16 of 30 positions shown; findings below may reference images not displayed]

FINDINGS: Age-related atrophy.

Normal ventricular morphology.

No midline shift or mass effect.

Few scattered artifacts from skull.

No definite intracranial hemorrhage, mass lesion, or evidence acute
infarction.

No extra-axial fluid collections.

Bones demineralized.

Atherosclerotic calcifications at the carotid siphons.

Minimal mucosal thickening in the maxillary sinuses.
IMPRESSION: Age-related atrophy.

No acute intracranial abnormalities.

## 2016-03-21 ENCOUNTER — Encounter (INDEPENDENT_AMBULATORY_CARE_PROVIDER_SITE_OTHER): Payer: Self-pay

## 2021-08-15 NOTE — Telephone Encounter (Signed)
noted
